# Patient Record
Sex: Female | Born: 1960 | Race: Black or African American | Hispanic: No | Marital: Married | Smoking: Former smoker
Health system: Southern US, Community
[De-identification: ages and names within clinical notes are randomized; demographics above are authoritative.]

## PROBLEM LIST (undated history)

## (undated) DIAGNOSIS — F419 Anxiety disorder, unspecified: Secondary | ICD-10-CM

## (undated) DIAGNOSIS — K743 Primary biliary cirrhosis: Secondary | ICD-10-CM

## (undated) DIAGNOSIS — J329 Chronic sinusitis, unspecified: Secondary | ICD-10-CM

## (undated) DIAGNOSIS — G4730 Sleep apnea, unspecified: Secondary | ICD-10-CM

## (undated) DIAGNOSIS — E669 Obesity, unspecified: Secondary | ICD-10-CM

## (undated) DIAGNOSIS — K219 Gastro-esophageal reflux disease without esophagitis: Secondary | ICD-10-CM

## (undated) DIAGNOSIS — Z87442 Personal history of urinary calculi: Secondary | ICD-10-CM

## (undated) DIAGNOSIS — M1990 Unspecified osteoarthritis, unspecified site: Secondary | ICD-10-CM

## (undated) DIAGNOSIS — I10 Essential (primary) hypertension: Secondary | ICD-10-CM

## (undated) DIAGNOSIS — R011 Cardiac murmur, unspecified: Secondary | ICD-10-CM

## (undated) DIAGNOSIS — B009 Herpesviral infection, unspecified: Secondary | ICD-10-CM

## (undated) DIAGNOSIS — R350 Frequency of micturition: Secondary | ICD-10-CM

## (undated) HISTORY — PX: UMBILICAL HERNIA REPAIR: SHX196

## (undated) HISTORY — DX: Gastro-esophageal reflux disease without esophagitis: K21.9

## (undated) HISTORY — DX: Obesity, unspecified: E66.9

## (undated) HISTORY — DX: Essential (primary) hypertension: I10

## (undated) HISTORY — PX: OTHER SURGICAL HISTORY: SHX169

## (undated) HISTORY — DX: Primary biliary cirrhosis: K74.3

---

## 2001-05-16 ENCOUNTER — Ambulatory Visit (HOSPITAL_COMMUNITY): Admission: RE | Admit: 2001-05-16 | Discharge: 2001-05-16 | Payer: Self-pay | Admitting: Internal Medicine

## 2001-05-16 ENCOUNTER — Encounter: Payer: Self-pay | Admitting: Internal Medicine

## 2001-05-29 ENCOUNTER — Encounter (HOSPITAL_COMMUNITY): Admission: RE | Admit: 2001-05-29 | Discharge: 2001-06-28 | Payer: Self-pay | Admitting: Internal Medicine

## 2001-07-01 ENCOUNTER — Encounter (HOSPITAL_COMMUNITY): Admission: RE | Admit: 2001-07-01 | Discharge: 2001-07-31 | Payer: Self-pay | Admitting: Internal Medicine

## 2001-08-09 ENCOUNTER — Other Ambulatory Visit: Admission: RE | Admit: 2001-08-09 | Discharge: 2001-08-09 | Payer: Self-pay | Admitting: Obstetrics and Gynecology

## 2003-10-10 HISTORY — PX: OTHER SURGICAL HISTORY: SHX169

## 2003-10-26 ENCOUNTER — Ambulatory Visit (HOSPITAL_COMMUNITY): Admission: RE | Admit: 2003-10-26 | Discharge: 2003-10-26 | Payer: Self-pay | Admitting: Internal Medicine

## 2004-07-22 ENCOUNTER — Encounter (INDEPENDENT_AMBULATORY_CARE_PROVIDER_SITE_OTHER): Payer: Self-pay | Admitting: *Deleted

## 2004-08-29 ENCOUNTER — Ambulatory Visit (HOSPITAL_COMMUNITY): Admission: RE | Admit: 2004-08-29 | Discharge: 2004-08-29 | Payer: Self-pay | Admitting: General Surgery

## 2004-09-06 ENCOUNTER — Ambulatory Visit: Payer: Self-pay | Admitting: Family Medicine

## 2004-11-07 ENCOUNTER — Ambulatory Visit (HOSPITAL_COMMUNITY): Admission: RE | Admit: 2004-11-07 | Discharge: 2004-11-07 | Payer: Self-pay | Admitting: Family Medicine

## 2004-12-28 ENCOUNTER — Ambulatory Visit (HOSPITAL_COMMUNITY): Admission: RE | Admit: 2004-12-28 | Discharge: 2004-12-28 | Payer: Self-pay | Admitting: Pulmonary Disease

## 2005-01-02 ENCOUNTER — Ambulatory Visit: Payer: Self-pay | Admitting: Family Medicine

## 2005-02-06 ENCOUNTER — Ambulatory Visit: Payer: Self-pay | Admitting: Family Medicine

## 2005-02-06 ENCOUNTER — Ambulatory Visit: Payer: Self-pay | Admitting: Internal Medicine

## 2005-02-13 ENCOUNTER — Ambulatory Visit: Payer: Self-pay | Admitting: Internal Medicine

## 2005-02-13 ENCOUNTER — Ambulatory Visit (HOSPITAL_COMMUNITY): Admission: RE | Admit: 2005-02-13 | Discharge: 2005-02-13 | Payer: Self-pay | Admitting: Internal Medicine

## 2005-05-09 ENCOUNTER — Ambulatory Visit: Payer: Self-pay | Admitting: Internal Medicine

## 2005-06-26 ENCOUNTER — Ambulatory Visit: Payer: Self-pay | Admitting: Family Medicine

## 2005-10-09 HISTORY — PX: OTHER SURGICAL HISTORY: SHX169

## 2005-11-06 ENCOUNTER — Ambulatory Visit: Payer: Self-pay | Admitting: Family Medicine

## 2005-12-04 ENCOUNTER — Encounter: Admission: RE | Admit: 2005-12-04 | Discharge: 2005-12-04 | Payer: Self-pay | Admitting: General Surgery

## 2005-12-12 ENCOUNTER — Encounter: Admission: RE | Admit: 2005-12-12 | Discharge: 2005-12-12 | Payer: Self-pay | Admitting: Obstetrics and Gynecology

## 2005-12-25 ENCOUNTER — Ambulatory Visit (HOSPITAL_COMMUNITY): Admission: RE | Admit: 2005-12-25 | Discharge: 2005-12-25 | Payer: Self-pay | Admitting: Interventional Radiology

## 2005-12-28 ENCOUNTER — Inpatient Hospital Stay (HOSPITAL_COMMUNITY): Admission: RE | Admit: 2005-12-28 | Discharge: 2005-12-29 | Payer: Self-pay | Admitting: Interventional Radiology

## 2006-01-24 ENCOUNTER — Ambulatory Visit (HOSPITAL_COMMUNITY): Admission: RE | Admit: 2006-01-24 | Discharge: 2006-01-24 | Payer: Self-pay | Admitting: General Surgery

## 2006-01-24 ENCOUNTER — Encounter (INDEPENDENT_AMBULATORY_CARE_PROVIDER_SITE_OTHER): Payer: Self-pay | Admitting: *Deleted

## 2006-04-02 ENCOUNTER — Ambulatory Visit: Payer: Self-pay | Admitting: Family Medicine

## 2006-04-04 ENCOUNTER — Ambulatory Visit (HOSPITAL_COMMUNITY): Admission: RE | Admit: 2006-04-04 | Discharge: 2006-04-04 | Payer: Self-pay | Admitting: Family Medicine

## 2006-06-04 ENCOUNTER — Ambulatory Visit: Payer: Self-pay | Admitting: Family Medicine

## 2006-06-19 ENCOUNTER — Encounter: Admission: RE | Admit: 2006-06-19 | Discharge: 2006-06-19 | Payer: Self-pay | Admitting: Interventional Radiology

## 2006-10-29 ENCOUNTER — Ambulatory Visit: Payer: Self-pay | Admitting: Family Medicine

## 2006-12-03 ENCOUNTER — Encounter: Payer: Self-pay | Admitting: Family Medicine

## 2006-12-03 LAB — CONVERTED CEMR LAB
ALT: 24 units/L (ref 0–35)
AST: 24 units/L (ref 0–37)
BUN: 11 mg/dL (ref 6–23)
Basophils Relative: 1 % (ref 0–1)
CO2: 22 meq/L (ref 19–32)
Calcium: 9.2 mg/dL (ref 8.4–10.5)
Cholesterol: 193 mg/dL (ref 0–200)
Creatinine, Ser: 0.73 mg/dL (ref 0.40–1.20)
Eosinophils Absolute: 0.4 10*3/uL (ref 0.0–0.7)
Indirect Bilirubin: 0.5 mg/dL (ref 0.0–0.9)
LDL Cholesterol: 133 mg/dL — ABNORMAL HIGH (ref 0–99)
Lymphs Abs: 2 10*3/uL (ref 0.7–3.3)
MCV: 90.1 fL (ref 78.0–100.0)
Neutro Abs: 2.5 10*3/uL (ref 1.7–7.7)
Platelets: 261 10*3/uL (ref 150–400)
Potassium: 3.5 meq/L (ref 3.5–5.3)
Sodium: 141 meq/L (ref 135–145)
Total Bilirubin: 0.7 mg/dL (ref 0.3–1.2)
Total CHOL/HDL Ratio: 4.9
Total Protein: 7.5 g/dL (ref 6.0–8.3)
VLDL: 21 mg/dL (ref 0–40)

## 2006-12-04 ENCOUNTER — Encounter: Payer: Self-pay | Admitting: Family Medicine

## 2006-12-04 LAB — CONVERTED CEMR LAB
Folate: >20 ng/mL
Retic Ct Pct: 1.2 % (ref 0.4–3.1)
Vitamin B-12: 1508 pg/mL — ABNORMAL HIGH (ref 211–911)

## 2006-12-05 ENCOUNTER — Encounter (INDEPENDENT_AMBULATORY_CARE_PROVIDER_SITE_OTHER): Payer: Self-pay | Admitting: *Deleted

## 2006-12-05 ENCOUNTER — Other Ambulatory Visit: Admission: RE | Admit: 2006-12-05 | Discharge: 2006-12-05 | Payer: Self-pay | Admitting: Family Medicine

## 2006-12-05 ENCOUNTER — Ambulatory Visit: Payer: Self-pay | Admitting: Family Medicine

## 2006-12-05 LAB — CONVERTED CEMR LAB: Pap Smear: NORMAL

## 2007-10-10 ENCOUNTER — Encounter: Payer: Self-pay | Admitting: Family Medicine

## 2008-01-28 ENCOUNTER — Encounter: Payer: Self-pay | Admitting: Family Medicine

## 2008-01-28 LAB — CONVERTED CEMR LAB: Retic Ct Pct: 1.3 % (ref 0.4–3.1)

## 2008-02-04 ENCOUNTER — Encounter: Payer: Self-pay | Admitting: Family Medicine

## 2008-02-04 ENCOUNTER — Other Ambulatory Visit: Admission: RE | Admit: 2008-02-04 | Discharge: 2008-02-04 | Payer: Self-pay | Admitting: Family Medicine

## 2008-02-04 ENCOUNTER — Encounter (INDEPENDENT_AMBULATORY_CARE_PROVIDER_SITE_OTHER): Payer: Self-pay | Admitting: *Deleted

## 2008-02-04 ENCOUNTER — Ambulatory Visit: Payer: Self-pay | Admitting: Family Medicine

## 2008-02-12 ENCOUNTER — Encounter (INDEPENDENT_AMBULATORY_CARE_PROVIDER_SITE_OTHER): Payer: Self-pay | Admitting: *Deleted

## 2008-02-12 DIAGNOSIS — E663 Overweight: Secondary | ICD-10-CM | POA: Insufficient documentation

## 2008-02-12 DIAGNOSIS — E669 Obesity, unspecified: Secondary | ICD-10-CM

## 2008-02-12 DIAGNOSIS — I10 Essential (primary) hypertension: Secondary | ICD-10-CM | POA: Insufficient documentation

## 2008-02-12 DIAGNOSIS — K219 Gastro-esophageal reflux disease without esophagitis: Secondary | ICD-10-CM

## 2008-06-23 ENCOUNTER — Encounter: Payer: Self-pay | Admitting: Family Medicine

## 2008-06-23 LAB — CONVERTED CEMR LAB
CO2: 28 meq/L (ref 19–32)
Calcium: 9.2 mg/dL (ref 8.4–10.5)
Chloride: 105 meq/L (ref 96–112)
Glucose, Bld: 90 mg/dL (ref 70–99)
HDL: 40 mg/dL (ref >39)
LDL Cholesterol: 143 mg/dL — ABNORMAL HIGH (ref 0–99)
Potassium: 3.9 meq/L (ref 3.5–5.3)
Total CHOL/HDL Ratio: 5

## 2008-07-15 ENCOUNTER — Ambulatory Visit: Payer: Self-pay | Admitting: Family Medicine

## 2008-07-15 DIAGNOSIS — IMO0002 Reserved for concepts with insufficient information to code with codable children: Secondary | ICD-10-CM

## 2008-07-15 DIAGNOSIS — M25559 Pain in unspecified hip: Secondary | ICD-10-CM

## 2008-07-23 ENCOUNTER — Ambulatory Visit (HOSPITAL_COMMUNITY): Admission: RE | Admit: 2008-07-23 | Discharge: 2008-07-23 | Payer: Self-pay | Admitting: Family Medicine

## 2008-07-27 ENCOUNTER — Telehealth: Payer: Self-pay | Admitting: Family Medicine

## 2008-08-20 ENCOUNTER — Telehealth: Payer: Self-pay | Admitting: Family Medicine

## 2008-08-24 ENCOUNTER — Encounter: Payer: Self-pay | Admitting: Family Medicine

## 2008-12-04 ENCOUNTER — Telehealth: Payer: Self-pay | Admitting: Family Medicine

## 2008-12-07 ENCOUNTER — Telehealth: Payer: Self-pay | Admitting: Family Medicine

## 2008-12-09 ENCOUNTER — Telehealth: Payer: Self-pay | Admitting: Family Medicine

## 2009-01-18 ENCOUNTER — Telehealth: Payer: Self-pay | Admitting: Family Medicine

## 2009-02-23 ENCOUNTER — Telehealth: Payer: Self-pay | Admitting: Family Medicine

## 2009-03-09 ENCOUNTER — Telehealth: Payer: Self-pay | Admitting: Family Medicine

## 2009-04-22 ENCOUNTER — Telehealth: Payer: Self-pay | Admitting: Family Medicine

## 2009-05-18 ENCOUNTER — Telehealth: Payer: Self-pay | Admitting: Family Medicine

## 2009-07-26 ENCOUNTER — Encounter: Payer: Self-pay | Admitting: Family Medicine

## 2009-07-26 DIAGNOSIS — R5383 Other fatigue: Secondary | ICD-10-CM

## 2009-07-26 DIAGNOSIS — R5381 Other malaise: Secondary | ICD-10-CM | POA: Insufficient documentation

## 2009-07-27 ENCOUNTER — Telehealth: Payer: Self-pay | Admitting: Family Medicine

## 2009-08-02 ENCOUNTER — Encounter: Payer: Self-pay | Admitting: Family Medicine

## 2009-08-02 LAB — CONVERTED CEMR LAB
BUN: 14 mg/dL (ref 6–23)
Chloride: 103 meq/L (ref 96–112)
Cholesterol: 201 mg/dL — ABNORMAL HIGH (ref 0–200)
Creatinine, Ser: 0.67 mg/dL (ref 0.40–1.20)
Eosinophils Absolute: 0.3 10*3/uL (ref 0.0–0.7)
Glucose, Bld: 84 mg/dL (ref 70–99)
HCT: 40 % (ref 36.0–46.0)
HDL: 43 mg/dL (ref >39)
Lymphocytes Relative: 33 % (ref 12–46)
MCHC: 30.3 g/dL (ref 30.0–36.0)
MCV: 92.8 fL (ref 78.0–100.0)
Platelets: 249 10*3/uL (ref 150–400)
Potassium: 4.1 meq/L (ref 3.5–5.3)
RBC: 4.31 M/uL (ref 3.87–5.11)
Triglycerides: 95 mg/dL (ref <150)

## 2009-08-05 ENCOUNTER — Encounter: Payer: Self-pay | Admitting: Family Medicine

## 2009-08-10 ENCOUNTER — Ambulatory Visit: Payer: Self-pay | Admitting: Family Medicine

## 2009-08-19 DIAGNOSIS — E785 Hyperlipidemia, unspecified: Secondary | ICD-10-CM

## 2009-09-27 ENCOUNTER — Telehealth: Payer: Self-pay | Admitting: Family Medicine

## 2009-11-03 ENCOUNTER — Ambulatory Visit: Payer: Self-pay | Admitting: Family Medicine

## 2010-04-04 ENCOUNTER — Telehealth (INDEPENDENT_AMBULATORY_CARE_PROVIDER_SITE_OTHER): Payer: Self-pay | Admitting: *Deleted

## 2010-05-23 ENCOUNTER — Telehealth: Payer: Self-pay | Admitting: Family Medicine

## 2010-06-15 ENCOUNTER — Ambulatory Visit: Payer: Self-pay | Admitting: Family Medicine

## 2010-06-15 ENCOUNTER — Other Ambulatory Visit: Admission: RE | Admit: 2010-06-15 | Discharge: 2010-06-15 | Payer: Self-pay | Admitting: Family Medicine

## 2010-06-15 ENCOUNTER — Encounter: Payer: Self-pay | Admitting: Cardiology

## 2010-06-15 DIAGNOSIS — R079 Chest pain, unspecified: Secondary | ICD-10-CM

## 2010-06-15 DIAGNOSIS — R7301 Impaired fasting glucose: Secondary | ICD-10-CM

## 2010-06-15 DIAGNOSIS — R9431 Abnormal electrocardiogram [ECG] [EKG]: Secondary | ICD-10-CM

## 2010-06-15 DIAGNOSIS — R002 Palpitations: Secondary | ICD-10-CM | POA: Insufficient documentation

## 2010-06-15 LAB — CONVERTED CEMR LAB: OCCULT 1: NEGATIVE

## 2010-06-18 ENCOUNTER — Encounter: Payer: Self-pay | Admitting: Cardiology

## 2010-06-20 ENCOUNTER — Encounter: Payer: Self-pay | Admitting: Family Medicine

## 2010-06-20 LAB — CONVERTED CEMR LAB
ALT: 15 units/L (ref 0–35)
AST: 18 units/L (ref 0–37)
Albumin: 4.1 g/dL (ref 3.5–5.2)
Alkaline Phosphatase: 59 units/L (ref 39–117)
Bilirubin, Direct: 0.1 mg/dL (ref 0.0–0.3)
CO2: 32 meq/L (ref 19–32)
Calcium: 9.2 mg/dL (ref 8.4–10.5)
HDL: 44 mg/dL (ref >39)
Hgb A1c MFr Bld: 5.2 % (ref <5.7)
Indirect Bilirubin: 0.5 mg/dL (ref 0.0–0.9)
LDL Cholesterol: 122 mg/dL — ABNORMAL HIGH (ref 0–99)
Lymphocytes Relative: 30 % (ref 12–46)
Lymphs Abs: 1.3 10*3/uL (ref 0.7–4.0)
MCHC: 30.8 g/dL (ref 30.0–36.0)
MCV: 92.6 fL (ref 78.0–100.0)
Monocytes Absolute: 0.4 10*3/uL (ref 0.1–1.0)
Neutro Abs: 2.1 10*3/uL (ref 1.7–7.7)
Pap Smear: NEGATIVE
RBC: 4.21 M/uL (ref 3.87–5.11)
TSH: 0.956 microintl units/mL (ref 0.350–4.500)
Total CHOL/HDL Ratio: 4.2

## 2010-06-21 ENCOUNTER — Encounter: Payer: Self-pay | Admitting: Family Medicine

## 2010-06-21 ENCOUNTER — Ambulatory Visit: Payer: Self-pay | Admitting: Cardiology

## 2010-06-21 ENCOUNTER — Encounter (INDEPENDENT_AMBULATORY_CARE_PROVIDER_SITE_OTHER): Payer: Self-pay | Admitting: *Deleted

## 2010-06-21 DIAGNOSIS — R55 Syncope and collapse: Secondary | ICD-10-CM

## 2010-06-29 ENCOUNTER — Ambulatory Visit: Payer: Self-pay | Admitting: Cardiology

## 2010-06-29 ENCOUNTER — Encounter: Payer: Self-pay | Admitting: Cardiology

## 2010-07-20 ENCOUNTER — Encounter: Payer: Self-pay | Admitting: Cardiology

## 2010-07-21 ENCOUNTER — Telehealth: Payer: Self-pay | Admitting: Cardiology

## 2010-07-26 ENCOUNTER — Encounter (INDEPENDENT_AMBULATORY_CARE_PROVIDER_SITE_OTHER): Payer: Self-pay | Admitting: *Deleted

## 2010-07-26 ENCOUNTER — Ambulatory Visit: Payer: Self-pay | Admitting: Cardiology

## 2010-07-26 DIAGNOSIS — R9430 Abnormal result of cardiovascular function study, unspecified: Secondary | ICD-10-CM | POA: Insufficient documentation

## 2010-08-01 ENCOUNTER — Telehealth (INDEPENDENT_AMBULATORY_CARE_PROVIDER_SITE_OTHER): Payer: Self-pay | Admitting: *Deleted

## 2010-08-15 ENCOUNTER — Encounter: Payer: Self-pay | Admitting: Family Medicine

## 2010-10-18 ENCOUNTER — Ambulatory Visit
Admission: RE | Admit: 2010-10-18 | Discharge: 2010-10-18 | Payer: Self-pay | Source: Home / Self Care | Attending: Family Medicine | Admitting: Family Medicine

## 2010-10-18 DIAGNOSIS — N760 Acute vaginitis: Secondary | ICD-10-CM | POA: Insufficient documentation

## 2010-10-20 ENCOUNTER — Encounter: Payer: Self-pay | Admitting: Family Medicine

## 2010-11-08 NOTE — Letter (Signed)
Summary: HISTORY AND PHYSICAL  HISTORY AND PHYSICAL   Imported By: Lind Guest 03/01/2010 12:58:03  _____________________________________________________________________  External Attachment:    Type:   Image     Comment:   External Document

## 2010-11-08 NOTE — Assessment & Plan Note (Signed)
Summary: physical   Vital Signs:  Patient profile:   50 year old female Height:      69 inches Weight:      225 pounds BMI:     33.35 O2 Sat:      96 % Pulse rate:   64 / minute Pulse rhythm:   regular Resp:     16 per minute BP sitting:   122 / 74  (right arm) Cuff size:   regular  Vitals Entered By: Everitt Amber LPN (June 15, 2010 1:39 PM)  Nutrition Counseling: Patient's BMI is greater than 25 and therefore counseled on weight management options. CC: CPE  Vision Screening:Left eye w/o correction: 20 / 25 Right Eye w/o correction: 20 / 20 Both eyes w/o correction:  20/ 20  Color vision testing: normal      Vision Entered By: Everitt Amber LPN (June 15, 2010 1:45 PM)   CC:  CPE.  History of Present Illness: Reports  that she has been doing fairly well Denies recent fever or chills. Denies sinus pressure, nasal congestion , ear pain or sore throat. Denies chest congestion, or cough productive of sputum.  Denies abdominal pain, nausea, vomitting, diarrhea or constipation.She does have gERD and is aware that caffeine that she loves aggravates this.not using omeprazole daily, never had upper endo Denies change in bowel movements or bloody stool. Denies dysuria , frequency, incontinence or hesitancy. Denies  joint pain, swelling, or reduced mobility. Denies headaches, vertigo, seizures. Denies depression, anxiety or insomnia. Denies  rash, lesions, or itch.     Allergies: No Known Drug Allergies  Past History:  Past medical, surgical, family and social histories (including risk factors) reviewed, and no changes noted (except as noted below).  Past Medical History: Reviewed history from 02/12/2008 and no changes required. Current Problems:  GERD (ICD-530.81) OBESITY (ICD-278.00) HYPERTENSION (ICD-401.9)  Past Surgical History: Reviewed history from 02/12/2008 and no changes required. Umbilical hernia repair infancy Embolism Femoral artery x2  03-08-06 Left inguinal hernia repair and endometriosis Mar 08, 2006 Left parotid bx benign 03/08/04  Family History: Reviewed history from 02/12/2008 and no changes required. Mother has HTN,Hyperlipidemia,thyroid disease, died in 2009/03/08, had CAD and irreg geart rate Father healthy 2 Sisters one of whom is HTN,and the other has fibroids. Brother 1 healthy  Social History: Reviewed history from 08/10/2009 and no changes required. Employed Married children 1, 40 y/o son has ulcerative colitis Former Smoker Alcohol use-yes Drug use-yes  Review of Systems      See HPI General:  Complains of fatigue; drinks excessivecaffeine which bothers her reflux. Eyes:  Denies blurring and discharge. CV:  Complains of chest pain or discomfort, lightheadness, and palpitations; 3 month h/o intermittent posterior left chest pain,ni known aggravating factor, 6 month h/o intermittent palpitations, mother had CAD. GI:  Complains of abdominal pain; gERD, using omeprazole less coonsitently. Derm:  Complains of changes in nail beds; thickeneing and discoloration of both great toes. Endo:  Denies cold intolerance, excessive thirst, and excessive urination. Heme:  Denies abnormal bruising and bleeding. Allergy:  Complains of seasonal allergies; denies hives or rash and itching eyes.  Physical Exam  General:  Well-developed,obese,in no acute distress; alert,appropriate and cooperative throughout examination Head:  Normocephalic and atraumatic without obvious abnormalities. No apparent alopecia or balding. Eyes:  No corneal or conjunctival inflammation noted. EOMI. Perrla. Funduscopic exam benign, without hemorrhages, exudates or papilledema. Vision grossly normal. Ears:  External ear exam shows no significant lesions or deformities.  Otoscopic examination reveals clear  canals, tympanic membranes are intact bilaterally without bulging, retraction, inflammation or discharge. Hearing is grossly normal bilaterally. Nose:  External  nasal examination shows no deformity or inflammation. Nasal mucosa are pink and moist without lesions or exudates. Mouth:  Oral mucosa and oropharynx without lesions or exudates.  Teeth in good repair. Neck:  No deformities, masses, or tenderness noted. Chest Wall:  No deformities, masses, or tenderness noted. Breasts:  No mass, nodules, thickening, tenderness, bulging, retraction, inflamation, nipple discharge or skin changes noted.   Lungs:  Normal respiratory effort, chest expands symmetrically. Lungs are clear to auscultation, no crackles or wheezes. Heart:  Normal rate and regular rhythm. S1 and S2 normal without gallop, murmur, click, rub or other extra sounds. Abdomen:  Bowel sounds positive,abdomen soft and non-tender without masses, organomegaly or hernias noted. Rectal:  No external abnormalities noted. Normal sphincter tone. No rectal masses or tenderness. Genitalia:  Normal introitus for age, no external lesions, no vaginal discharge, mucosa pink and moist, no vaginal or cervical lesions, no vaginal atrophy, no friaility or hemorrhage, normal uterus size and position, no adnexal masses or tenderness Msk:  No deformity or scoliosis noted of thoracic or lumbar spine.   Pulses:  R and L carotid,radial,femoral,dorsalis pedis and posterior tibial pulses are full and equal bilaterally Extremities:  No clubbing, cyanosis, edema, or deformity noted with normal full range of motion of all joints.   Neurologic:  No cranial nerve deficits noted. Station and gait are normal. Plantar reflexes are down-going bilaterally. DTRs are symmetrical throughout. Sensory, motor and coordinative functions appear intact. Skin:  onychomycosis of great toenails Cervical Nodes:  No lymphadenopathy noted Axillary Nodes:  No palpable lymphadenopathy Inguinal Nodes:  No significant adenopathy Psych:  Cognition and judgment appear intact. Alert and cooperative with normal attention span and concentration. No apparent  delusions, illusions, hallucinations   Impression & Recommendations:  Problem # 1:  SPECIAL SCREENING FOR MALIGNANT NEOPLASMS COLON (ICD-V76.51) Assessment Comment Only  Orders: Hemoccult Guaiac-1 spec.(in office) (82270)  Problem # 2:  SCREENING FOR MALIGNANT NEOPLASM OF THE CERVIX (ICD-V76.2) Assessment: Comment Only  Orders: Pap Smear (09811)  Problem # 3:  ABNORMAL ELECTROCARDIOGRAM (ICD-794.31) Assessment: Comment Only  Orders: Cardiology Referral (Cardiology), pt has palpitations and has a fam h/o cAD and irreg heartbeat  Problem # 4:  GERD (ICD-530.81) Assessment: Unchanged  Her updated medication list for this problem includes:    Omeprazole 20 Mg Cpdr (Omeprazole) ..... One cap by mouth once daily as needed  Problem # 5:  OBESITY (ICD-278.00) Assessment: Unchanged  Ht: 69 (06/15/2010)   Wt: 225 (06/15/2010)   BMI: 33.35 (06/15/2010), lifesrtyle change encouraged to facilitate weight loss  Problem # 6:  HYPERTENSION (ICD-401.9) Assessment: Unchanged  The following medications were removed from the medication list:    Micardis Hct 80-25 Mg Tabs (Telmisartan-hctz) ..... One tab by mouth once daily Her updated medication list for this problem includes:    Amlodipine Besylate 5 Mg Tabs (Amlodipine besylate) ..... One tab by mouth once daily    Maxzide-25 37.5-25 Mg Tabs (Triamterene-hctz) ..... One tab by mouth qd  BP today: 122/74 Prior BP: 122/82 (11/03/2009)  Labs Reviewed: K+: 4.1 (08/02/2009) Creat: : 0.67 (08/02/2009)   Chol: 201 (08/02/2009)   HDL: 43 (08/02/2009)   LDL: 139 (08/02/2009)   TG: 95 (08/02/2009)  Complete Medication List: 1)  Multivitamin/iron Tabs (Multiple vitamins-iron) .... One tab by mouth once daily 2)  Amlodipine Besylate 5 Mg Tabs (Amlodipine besylate) .... One tab by mouth once  daily 3)  Omeprazole 20 Mg Cpdr (Omeprazole) .... One cap by mouth once daily as needed 4)  Maxzide-25 37.5-25 Mg Tabs (Triamterene-hctz) .... One tab by  mouth qd 5)  Terbinafine Hcl 250 Mg Tabs (Terbinafine hcl) .... Take 1 tablet by mouth once a day  Other Orders: Radiology Referral (Radiology) EKG w/ Interpretation (93000) T-Basic Metabolic Panel [phone]) T-Hepatic Function [phone]) T-Lipid Profile [phone]) T-TSH [id number]) T-CBC w/Diff ([id number]) T- Hemoglobin A1C (02725-36644)  Patient Instructions: 1)  F/U in 5.5 months 2)  pls cut down on caffeine. 3)  Med is sent in for thefungal toenail infectiion, pls do not start until we have your blood test results back and you hear they are normal(liver). 4)    5)  BMP prior to visit, ICD-9: 6)  Hepatic Panel prior to visit, ICD-9: 7)  Lipid Panel prior to visit, ICD-9:   fasting asap. 8)  TSH prior to visit, ICD-9: 9)  you willo be referred for cerdiology eval, due to palpitations, posterior chest pain and a fam h/o cAD in your mother. 10)  CBC w/ Diff prior to visit, ICD-9: 11)  HbgA1C prior to visit, ICD-9: 12)  It is important that you exercise regularly at least 20 minutes 5 times a week. If you develop chest pain, have severe difficulty breathing, or feel very tired , stop exercising immediately and seek medical attention. 13)  You need to lose weight. Consider a lower calorie diet and regular exercise.  14)  [place] will be scheduled Prescriptions: TERBINAFINE HCL 250 MG TABS (TERBINAFINE HCL) Take 1 tablet by mouth once a day  #30 x 3   Entered and Authorized by:   Syliva Overman MD   Signed by:   Syliva Overman MD on 06/15/2010   Method used:   Electronically to        [facility] (retail)       [address]       [place], [place]  [address]       Ph: [phone]       Fax: [phone]   RxID:   [id number]    Laboratory Results    Stool - Occult Blood Hemmoccult #1: negative Date: 06/15/2010 Comments: [id number] 1L 3/12 118 1012

## 2010-11-08 NOTE — Progress Notes (Signed)
Summary: refill  Phone Note Call from Patient   Summary of Call: pt has appt for 06/15/2010 but needs another refill on maxide. hctz [phone] Initial call taken by: Rudene Anda,  May 23, 2010 3:26 PM  Follow-up for Phone Call        Phone Call Completed, Rx Called In Follow-up by: Adella Hare LPN,  May 24, 2010 10:22 AM    Prescriptions: MAXZIDE-25 37.5-25 MG TABS (TRIAMTERENE-HCTZ) one tab by mouth qd  #30 Each x 0   Entered by:   Adella Hare LPN   Authorized by:   Syliva Overman MD   Signed by:   Adella Hare LPN on 03/47/4259   Method used:   Electronically to        [facility] (retail)       [address]       [place], [place]  [address]       Ph: [phone]       Fax: [phone]   RxID:   [id number]

## 2010-11-08 NOTE — Letter (Signed)
Summary: phone notes  phone notes   Imported By: Lind Guest 03/01/2010 12:50:28  _____________________________________________________________________  External Attachment:    Type:   Image     Comment:   External Document

## 2010-11-08 NOTE — Letter (Signed)
Summary: misc  misc   Imported By: Lind Guest 03/01/2010 12:49:22  _____________________________________________________________________  External Attachment:    Type:   Image     Comment:   External Document

## 2010-11-08 NOTE — Progress Notes (Signed)
Summary: Missed appointment  Phone Note Call from Patient Call back at Home Phone [phone]   Caller: Patient Reason for Call: Talk to Nurse Summary of Call: patient called because she missed her appointment with [facility] yesterday.  She needs to know if she really needs to come into the office to review is test results.  She doesnt have the money to come in or time to ask off from work.  Please call and let her know Initial call taken by: Claudette Laws,  July 21, 2010 8:49 AM  Follow-up for Phone Call        Her stress echocardiogram result was equivocal. I wanted to see her back to review symptoms and determine if we needed to arrange any additional testing. Will ask nursing to contact. Follow-up by: Loreli Slot, MD, [facility],  July 21, 2010 11:24 AM  Additional Follow-up for Phone Call Additional follow up Details #1::        Pt r/s for Tues Oct 18th at 2pm. Pt is aware of appt date/time. Additional Follow-up by: Cyril Loosen, RN, BSN,  July 21, 2010 2:19 PM

## 2010-11-08 NOTE — Progress Notes (Signed)
Summary: DR IN [place]  Phone Note Call from Patient   Summary of Call: HER SON NEEDS A PRIMARY DR WHO WOULD YOU RECOMEND IN [place] CALL BACK AT [phone] TO LET HER KNOW UNLESS YOU WILL TAKE HIM AS A NEW PATIENT Initial call taken by: Lind Guest,  May 23, 2010 3:32 PM  Follow-up for Phone Call        chck if he has ins pls Follow-up by: Syliva Overman MD,  May 24, 2010 3:46 PM  Additional Follow-up for Phone Call Additional follow up Details #1::        has bcbc  will come in tomorrow to see you at 2:45 Additional Follow-up by: Lind Guest,  May 25, 2010 11:19 AM

## 2010-11-08 NOTE — Assessment & Plan Note (Signed)
Summary: bp check  Nurse Visit   Vital Signs:  Patient profile:   50 year old female Height:      69 inches Weight:      227 pounds BMI:     33.64 O2 Sat:      95 % Pulse rate:   90 / minute Resp:     16 per minute BP sitting:   122 / 82  Vitals Entered By: Everitt Amber (November 03, 2009 4:37 PM) Comments BP check    Allergies: No Known Drug Allergies  Orders Added: 1)  No Charge Patient Arrived (NCPA0) [NCPA0]

## 2010-11-08 NOTE — Letter (Signed)
Summary: External Correspondence/ OFFICE VISIT [facility]  External Correspondence/ OFFICE VISIT [facility]   Imported By: Dorise Hiss 06/21/2010 10:26:34  _____________________________________________________________________  External Attachment:    Type:   Image     Comment:   External Document

## 2010-11-08 NOTE — Progress Notes (Signed)
Summary: rx  Phone Note Call from Patient   Summary of Call: says rx was denied and needs to speak with nurse. [phone] Initial call taken by: Rudene Anda,  April 04, 2010 9:26 AM  Follow-up for Phone Call        returned call, no answer, if calls back, needs to schedule appt Follow-up by: Adella Hare LPN,  April 04, 2010 2:55 PM  Additional Follow-up for Phone Call Additional follow up Details #1::        no insurance, sent in two months until insurance starts up Additional Follow-up by: Adella Hare LPN,  April 04, 2010 4:05 PM    Prescriptions: MAXZIDE-25 37.5-25 MG TABS (TRIAMTERENE-HCTZ) one tab by mouth qd  #30 Each x 1   Entered by:   Adella Hare LPN   Authorized by:   Syliva Overman MD   Signed by:   Adella Hare LPN on 45/40/9811   Method used:   Electronically to        [facility] (retail)       [address]       [place], [place]  [address]       Ph: [phone]       Fax: [phone]   RxID:   [id number]

## 2010-11-08 NOTE — Letter (Signed)
Summary: office notes  office notes   Imported By: Lind Guest 03/01/2010 12:50:00  _____________________________________________________________________  External Attachment:    Type:   Image     Comment:   External Document

## 2010-11-08 NOTE — Letter (Signed)
Summary: x rays  x rays   Imported By: Lind Guest 03/01/2010 12:51:20  _____________________________________________________________________  External Attachment:    Type:   Image     Comment:   External Document

## 2010-11-08 NOTE — Letter (Signed)
Summary: Letter  Letter   Imported By: Lind Guest 06/22/2010 14:29:51  _____________________________________________________________________  External Attachment:    Type:   Image     Comment:   External Document

## 2010-11-08 NOTE — Letter (Signed)
Summary: Pharmacist, community at [facility] [address]   [place], [place] [address]   Phone: [phone]  Fax: [phone]      [facility] Cardiovascular Services  Cardiolite Stress Test     [facility]  Your doctor has ordered a Cardiolite Stress Test to help determine the condition of your heart during stress. If you take blood pressure medicine, ask your doctor if you should take it the day of your test. You should not have anything to eat or drink at least 4 hours before your test is scheduled, and no caffeine (coffee, tea, decaf. or chocolate) for 24 hours before your test.   You will need to register at the Outpatient/Main Entrance at the hospital 30 minutes before your appointment time. It is a good idea to bring a copy of your order with you. They will direct you to the Diagnostic Imaging (Radiology) Department.  You will be asked to undress from the waist up and be given a hospital gown to wear, so dress comfortably from the waist down, for example:    Sweat pants, shorts or skirt   Rubber-soled lace up shoes (i.e. tennis shoes)  Plan on about three hours from registration to release from the hospital.    Date of Test:              Time of Test              You may take your medications with water the morning of your test.  If the results of your test are normal or stable, you will receive a letter. If they are abnormal, the nurse will contact you by phone.

## 2010-11-08 NOTE — Letter (Signed)
Summary: Stress Echocardiography  [facility] [address]   [place], [place] [address]   Phone: [phone]  Fax: [phone]      [facility] Cardiovascular Services  Stress Echocardiography    [facility]  Appointment Date:_  Appointment Time:_   Your doctor has ordered a stress echo to help determine the condition of your heart during exercise. If you take blood pressure medication, ask your doctor if you should take it the day of your test. You should not have anything to eat or drink at least 4 hours before your test is scheduled.  You will be asked to undress from the waist up and given a hospital gown to wear, so dress comfortably from the waist down for example: Sweat pants, shorts, or skirt Rubber soled lace up shoes (tennis shoes)  You will need to register at the Outpatient/Main Entrance at the hospital 15 minutes before your appointment time. It is a good idea to bring a copy of your order with you. They will direct you to the Cardiovascular Department on the third floor.   Plan on about an hour and a half  from registration to release from the hospital

## 2010-11-08 NOTE — Assessment & Plan Note (Signed)
Summary: NP-ABNORMAL EKG W/PALPS & CP   Primary Provider:  Dr. Syliva Overman   History of Present Illness: 50 year old woman referred for cardiology consultation. She is referred with a history of heart "skips." She does not indicate any major feeling of rapid palpitations. She does occasionally feel a brief "dizziness" if she bends over or twists suddenly. No exertional chest pain, and she reports NYHA class II dyspnea on exertion. She does not exercise regularly. She does report dependent edema as she works on her feet most of the day.  Recent labs from 10 September showed hemoglobin 12.0, platelets 241, sodium 142, potassium 3.6, BUN 11, creatinine 0.6, triglycerides 84, cholesterol 183, HDL 44, LDL 122, AST 18, ALT 15, TSH 0.9, hemoglobin A1c 5.2%. Faxed copy of recent EKG reviewed, difficult to interpret given poor image quality. Sinus rhythm is noted. Nonspecific T-wave changes. Decreased R-wave progression without Q waves anteriorly.  She has not undergone any recent ischemic evaluation. She states that she is interested in starting an exercise regimen to work on weight loss and also her cholesterol. She relates somewhat of a hesitancy to pursue exercise in light of her mother's prior difficulaties with heart problems.  Current Medications (verified): 1)  Multivitamin/iron   Tabs (Multiple Vitamins-Iron) .... One Tab By Mouth Once Daily 2)  Amlodipine Besylate 5 Mg  Tabs (Amlodipine Besylate) .... One Tab By Mouth Once Daily 3)  Omeprazole 20 Mg  Cpdr (Omeprazole) .... One Cap By Mouth Once Daily As Needed 4)  Maxzide-25 37.5-25 Mg Tabs (Triamterene-Hctz) .... One Tab By Mouth Qd 5)  Terbinafine Hcl 250 Mg Tabs (Terbinafine Hcl) .... Take 1 Tablet By Mouth Once A Day 6)  Fluconazole 150 Mg Tabs (Fluconazole) .... Take One Tab By Mouth  Allergies (verified): No Known Drug Allergies  Comments:  Nurse/Medical Assistant: The patient's medications and allergies were verbally reviewed  with the patient and were updated in the Medication and Allergy Lists.  Past History:  Family History: Last updated: 06/21/2010 Mother has HTN, Hyperlipidemia, thyroid disease, died in 2009/03/01, had CAD and irreg geart rate Father healthy 2 Sisters one of whom is HTN, and the other has fibroids. Brother 1 healthy  Social History: Last updated: 06/21/2010 Employed Married Children 1, 98 y/o son has ulcerative colitis Former Smoker Alcohol use-yes Drug use-yes  Past Medical History: G E R D Hypertension Obesity  Past Surgical History: Umbilical hernia repair infancy Embolism Femoral artery x 2, 03/01/06 Left inguinal hernia repair and endometriosis, 2006/03/01 (no definitive records) Left parotid bx benign, Mar 01, 2004  Family History: Mother has HTN, Hyperlipidemia, thyroid disease, died in Mar 01, 2009, had CAD and irreg geart rate Father healthy 2 Sisters one of whom is HTN, and the other has fibroids. Brother 1 healthy  Social History: Employed Married Children 1, 65 y/o son has ulcerative colitis Former Smoker Alcohol use-yes Drug use-yes  Review of Systems       The patient complains of dyspnea on exertion and peripheral edema.  The patient denies anorexia, fever, weight loss, chest pain, syncope, melena, hematochezia, and severe indigestion/heartburn.         Also reports general fatigue, history of sinus problems, uses reading glasses, occasional reflux symptoms. Otherwise reviewed and negative.  Vital Signs:  Patient profile:   50 year old female Height:      69 inches Weight:      224 pounds O2 Sat:      97 % on Room air Pulse rate:   74 / minute BP sitting:  108 / 75  (left arm) Cuff size:   large  Vitals Entered By: Carlye Grippe (June 21, 2010 1:38 PM)  O2 Flow:  Room air  Physical Exam  Additional Exam:  Overweight woman in no acute distress. HEENT: Conjunctiva and lids normal, oropharynx clear. Neck: Supple, no elevated JVP or bruits. Lungs: Clear  auscultation, nonlabored. Cardiac: Regular rate and rhythm with ectopic beats, soft systolic murmur, no pericardial rub or S3 gallop. Abdomen: Soft, nontender, bowel sounds present. Skin: Warm and dry. Musculoskeletal: No gross deformities. Extremities: Trace ankle edema. Neuropsychiatric: Alert and oriented x3, affect appropriate.   Impression & Recommendations:  Problem # 1:  SYNCOPE AND COLLAPSE (ICD-780.2)  More specifically brief episodes of dizziness without frank syncope. Not necessarily associated with any frank palpitations, although on exam she does have ectopic beats. Review of her recent electrocardiogram shows nonspecific findings. Cardiac risk factors include hypertension and family history. She also has mildly elevated cholesterol. We discussed these issues, and plan an exercise echocardiogram, to exclude any exercise induced dysrhythmias, evaluate cardiac structure and function, and exclude ischemia. If reassuring, would recommend observation. Otherwise we can proceed with further evaluation. No medication changes made today.  Her updated medication list for this problem includes:    Amlodipine Besylate 5 Mg Tabs (Amlodipine besylate) ..... One tab by mouth once daily  Orders: Echo- Stress (Stress Echo)  Problem # 2:  HYPERTENSION (ICD-401.9)  Followed by Dr. Lodema Hong. Blood pressure looks good today.  Her updated medication list for this problem includes:    Amlodipine Besylate 5 Mg Tabs (Amlodipine besylate) ..... One tab by mouth once daily    Maxzide-25 37.5-25 Mg Tabs (Triamterene-hctz) ..... One tab by mouth qd  Problem # 3:  OTHER AND UNSPECIFIED HYPERLIPIDEMIA (ICD-272.4)  Recent lab work shows LDL of 122, HDL 44. Diet and exercise recommended, presuming stress testing is reassuring.  Patient Instructions: 1)  Exercise echo 2)  Follow up based on test results

## 2010-11-08 NOTE — Letter (Signed)
Summary: demo  demo   Imported By: Lind Guest 03/01/2010 12:48:10  _____________________________________________________________________  External Attachment:    Type:   Image     Comment:   External Document

## 2010-11-08 NOTE — Letter (Signed)
Summary: consults  consults   Imported By: Lind Guest 03/01/2010 12:47:42  _____________________________________________________________________  External Attachment:    Type:   Image     Comment:   External Document

## 2010-11-08 NOTE — Letter (Signed)
Summary: Appointment -missed  [facility] [address]   [place], [place] [address]   Phone: [phone]  Fax: [phone]     July 20, 2010 MRN: [id number]     BEADIE MATSUNAGA [address] [place], [place]  [address]     Dear Ms. Feil,  Our records indicate you missed your appointment on July 20, 2010                        with Dr. Diona Browner.   It is very important that we reach you to reschedule this appointment. We look forward to participating in your health care needs.   Please contact us at the number listed above at your earliest convenience to reschedule this appointment.   Sincerely,    Glass blower/designer

## 2010-11-08 NOTE — Assessment & Plan Note (Signed)
Summary: R/S FROM 10/12 OV TO DISCUSS RESULTS-JM   Visit Type:  Follow-up Primary Provider:  Dr. Syliva Overman   History of Present Illness: 50 year old woman presents for follow-up to review stress test results.  She was seen back in September with occasional dizziness and palpitations.  Stress echocardiogram interpreted by Dr. Myrtis Ser  is outlined below.  The results are equivocal at best, affected by suboptimal imaging. It is reassuring to note that her stress ECG portion was normal without diagnostic ST changes, however ischemia cannot be excluded by echocardiogram. I discussed this in detail with her today. She is not reporting any progressive symptoms, or classic angina.  I reviewed with her options going forward, including observation only at this time, versus proceeding on to additional ischemic evaluation. We discussed a variety of noninvasive and invasive techniques. At this point she prefers further testing to clarify the situation and provide reassurance.   Preventive Screening-Counseling & Management  Alcohol-Tobacco     Smoking Status: quit     Year Quit: 1999  Current Medications (verified): 1)  Multivitamin/iron   Tabs (Multiple Vitamins-Iron) .... One Tab By Mouth Once Daily 2)  Amlodipine Besylate 5 Mg  Tabs (Amlodipine Besylate) .... One Tab By Mouth Once Daily 3)  Omeprazole 20 Mg  Cpdr (Omeprazole) .... One Cap By Mouth Once Daily As Needed 4)  Maxzide-25 37.5-25 Mg Tabs (Triamterene-Hctz) .... One Tab By Mouth Qd 5)  Terbinafine Hcl 250 Mg Tabs (Terbinafine Hcl) .... Take 1 Tablet By Mouth Once A Day 6)  Fluconazole 150 Mg Tabs (Fluconazole) .... Take One Tab By Mouth  Allergies (verified): No Known Drug Allergies  Comments:  Nurse/Medical Assistant: The patient's medications and allergies were verbally reviewed with the patient and were updated in the Medication and Allergy Lists.  Past History:  Past Medical History: Last updated: 06/21/2010 G E R  D Hypertension Obesity  Social History: Last updated: 06/21/2010 Employed Married Children 1, 20 y/o son has ulcerative colitis Former Smoker Alcohol use-yes Drug use-yes  Review of Systems  The patient denies anorexia, fever, chest pain, syncope, peripheral edema, headaches, melena, and hematochezia.         Otherwise reviewed and negative.  Vital Signs:  Patient profile:   50 year old female Height:      69 inches Weight:      220 pounds Pulse rate:   71 / minute BP sitting:   139 / 80  (left arm) Cuff size:   large  Vitals Entered By: Carlye Grippe (July 26, 2010 2:02 PM)  Physical Exam  Additional Exam:  Overweight woman in no acute distress. HEENT: Conjunctiva and lids normal, oropharynx clear. Neck: Supple, no elevated JVP or bruits. Lungs: Clear auscultation, nonlabored. Cardiac: Regular rate and rhythm with ectopic beats, soft systolic murmur, no pericardial rub or S3 gallop. Abdomen: Soft, nontender, bowel sounds present. Skin: Warm and dry. Extremities: Trace ankle edema.    Stress Echocardiogram  Procedure date:  06/29/2010  Findings:      Exercise echocardiogram:  No diagnostic ST changes, 97% MPHR, no chest pain.  Suboptimal echo images - questionable inferior hypokinesis, cannot exclude ischemia.  Impression & Recommendations:  Problem # 1:  CARDIOVASCULAR FUNCTION STUDY, ABNORMAL (ICD-794.30)  Equivocal exercise echocardiogram as detailed above. The patient and I discussed this today. After reviewing a number of options, our plan is to proceed with an exercise Cardiolite to better clarify the situation, with no charge for this study.  At this point I  do not feel that we need to push further with invasive cardiac evaluation, in light of her atypical and nonprogressive symptoms.  I suspect that her risk for major obstructive CAD remains relatively low.  Depending on the results, we can determine addition plans.  Problem # 2:  HYPERTENSION  (ICD-401.9)  Followed by Dr. Lodema Hong.  Her updated medication list for this problem includes:    Amlodipine Besylate 5 Mg Tabs (Amlodipine besylate) ..... One tab by mouth once daily    Maxzide-25 37.5-25 Mg Tabs (Triamterene-hctz) ..... One tab by mouth qd  Other Orders: Nuclear Med (Nuc Med)  Patient Instructions: 1)  Your physician has requested that you have an exercise stress myoview.  For further information please visit [email or web address]  Please follow instruction sheet, as given. If the results of your test are normal or stable, you will receive a letter. If they are abnormal, the nurse will contact you by phone.  2)  Your physician recommends that you continue on your current medications as directed. Please refer to the Current Medication list given to you today. 3)  Follow up based on results.

## 2010-11-08 NOTE — Letter (Signed)
Summary: labs  labs   Imported By: Lind Guest 03/01/2010 12:48:36  _____________________________________________________________________  External Attachment:    Type:   Image     Comment:   External Document

## 2010-11-10 NOTE — Progress Notes (Signed)
  Phone Note Call from Patient   Summary of Call: patient's back has been hurting since last week and wants to be seen this week.  [phone] Initial call taken by: Rosine Beat,  August 01, 2010 1:14 PM  Follow-up for Phone Call        schedule appt for later this week Follow-up by: Adella Hare LPN,  August 01, 2010 2:09 PM

## 2010-11-10 NOTE — Assessment & Plan Note (Signed)
Summary: wants to be checked for std/slj   Vital Signs:  Patient profile:   50 year old female Height:      69 inches Weight:      226.25 pounds BMI:     33.53 O2 Sat:      95 % Pulse rate:   70 / minute Resp:     16 per minute BP sitting:   138 / 94  (left arm) Cuff size:   regular  Vitals Entered By: Everitt Amber LPN (October 18, 2010 4:06 PM) CC: Wants to be checked for STD's, and also since starting the terbinifine she has noticed these ringworm type patches that pop up all over her body    Primary Care Provider:  Dr. Syliva Overman  CC:  Wants to be checked for STD's and and also since starting the terbinifine she has noticed these ringworm type patches that pop up all over her body .  History of Present Illness: Reports  that she has been doing fairly well. She has a concern in that she recently caught her spouse cheating and is concerned about possible STD exposure.She actually moved out for 1 month, but has returned, this is not the  first episode. Weight loss efforts remain inconsitent Denies recent fever or chills. Denies sinus pressure, nasal congestion , ear pain or sore throat. Denies chest congestion, or cough productive of sputum. Denies chest pain, palpitations, PND, orthopnea or leg swelling. Denies abdominal pain, nausea, vomitting, diarrhea or constipation. Denies change in bowel movements or bloody stool. Denies dysuria , frequency, incontinence or hesitancy. Denies  joint pain, swelling, or reduced mobility. Denies headaches, vertigo, seizures. Denies depression, anxiety or insomnia. Denies  rash, lesions, or itch.Slow imoprovement in fungal toenail infection note on lamisil     Current Medications (verified): 1)  Amlodipine Besylate 5 Mg  Tabs (Amlodipine Besylate) .... One Tab By Mouth Once Daily 2)  Omeprazole 20 Mg  Cpdr (Omeprazole) .... One Cap By Mouth Once Daily As Needed 3)  Maxzide-25 37.5-25 Mg Tabs (Triamterene-Hctz) .... One Tab By Mouth  Qd 4)  Terbinafine Hcl 250 Mg Tabs (Terbinafine Hcl) .... Take 1 Tablet By Mouth Once A Day  Allergies (verified): No Known Drug Allergies  Review of Systems      See HPI General:  Denies chills, fatigue, and fever. Eyes:  Denies blurring and discharge. GU:  Complains of discharge. Psych:  Denies anxiety and depression. Endo:  Denies excessive hunger, excessive thirst, and excessive urination. Heme:  Denies abnormal bruising and bleeding. Allergy:  Denies hives or rash and itching eyes.  Physical Exam  General:  Well-developed,well-nourished,in no acute distress; alert,appropriate and cooperative throughout examination HEENT: No facial asymmetry,  EOMI, No sinus tenderness, TM's Clear, oropharynx  pink and moist.   Chest: Clear to auscultation bilaterally.  CVS: S1, S2, No murmurs, No S3.   Abd: Soft, Nontender.  MS: Adequate ROM spine, hips, shoulders and knees.  Ext: No edema.   CNS: CN 2-12 intact, power tone and sensation normal throughout.   Skin: Intact, no visible lesions or rashes.  Psych: Good eye contact, normal affect.  Memory intact, not anxious or depressed appearing. Pelvic:Fishy vag d/c , no cervical motion or adnexal tenderness   Impression & Recommendations:  Problem # 1:  VULVOVAGINITIS (ICD-616.10) Assessment Comment Only  Her updated medication list for this problem includes:    Metronidazole 500 Mg Tabs (Metronidazole) .Marland Kitchen... Take 1 tablet by mouth two times a day  Orders: T-Wet Prep by  Molecular Probe [phone]) T-Chlamydia & GC Probe, Genital ([id number])  Problem # 2:  OBESITY (ICD-278.00) Assessment: Deteriorated  Ht: 69 (10/18/2010)   Wt: 226.25 (10/18/2010)   BMI: 33.53 (10/18/2010) therapeutic lifestyle change discussed and encouraged  Problem # 3:  HYPERTENSION (ICD-401.9) Assessment: Deteriorated  Her updated medication list for this problem includes:    Amlodipine Besylate 5 Mg Tabs (Amlodipine besylate) ..... One tab by  mouth once daily    Maxzide-25 37.5-25 Mg Tabs (Triamterene-hctz) ..... One tab by mouth qd Patient advised to follow low sodium diet rich in fruit and vegetables, and to commit to at least 30 minutes 5 days per week of regular exercise , to improve blood presure control.   BP today: 138/94 Prior BP: 139/80 (07/26/2010)  Labs Reviewed: K+: 3.6 (06/18/2010) Creat: : 0.68 (06/18/2010)   Chol: 183 (06/18/2010)   HDL: 44 (06/18/2010)   LDL: 122 (06/18/2010)   TG: 84 (06/18/2010)  Problem # 4:  GERD (ICD-530.81) Assessment: Improved  Her updated medication list for this problem includes:    Omeprazole 20 Mg Cpdr (Omeprazole) ..... One cap by mouth once daily as needed  Complete Medication List: 1)  Amlodipine Besylate 5 Mg Tabs (Amlodipine besylate) .... One tab by mouth once daily 2)  Omeprazole 20 Mg Cpdr (Omeprazole) .... One cap by mouth once daily as needed 3)  Maxzide-25 37.5-25 Mg Tabs (Triamterene-hctz) .... One tab by mouth qd 4)  Terbinafine Hcl 250 Mg Tabs (Terbinafine hcl) .... Take 1 tablet by mouth once a day 5)  Betamethasone Valerate 0.1 % Crea (Betamethasone valerate) .... Apply twice daiily for 1 week , then as needed to rashes 6)  Metronidazole 500 Mg Tabs (Metronidazole) .... Take 1 tablet by mouth two times a day  Other Orders: T-Hepatic Function ([id number])  Patient Instructions: 1)  f/u in 4 to 5 months. 2)  Hepatic panel endFeb , non fasting 3)  Call for results on Thursday afternoon if you do not hear from the office pls 4)  It is important that you exercise regularly at least 20 minutes 5 times a week. If you develop chest pain, have severe difficulty breathing, or feel very tired , stop exercising immediately and seek medical attention. 5)  You need to lose weight. Consider a lower calorie diet and regular exercise.  Prescriptions: METRONIDAZOLE 500 MG TABS (METRONIDAZOLE) Take 1 tablet by mouth two times a day  #14 x 0   Entered and Authorized by:    Syliva Overman MD   Signed by:   Syliva Overman MD on 10/23/2010   Method used:   Historical   RxID:   [id number] BETAMETHASONE VALERATE 0.1 % CREA (BETAMETHASONE VALERATE) apply twice daiily for 1 week , then as needed to rashes  #45 gm x 0   Entered and Authorized by:   Syliva Overman MD   Signed by:   Syliva Overman MD on 10/18/2010   Method used:   Electronically to        [facility] (retail)       [address]       [place], [place]  [address]       Ph: [phone]       Fax: [phone]   RxID:   [id number]    Orders Added: 1)  Est. Patient Level IV [[address]] 2)  T-Hepatic Function [[id number]] 3)  T-Wet Prep by Molecular Probe [[id number]] 4)  T-Chlamydia & GC Probe,  Genital [[id number]]

## 2010-11-23 LAB — CONVERTED CEMR LAB
ALT: 12 units/L (ref 0–35)
AST: 16 units/L (ref 0–37)
Indirect Bilirubin: 0.5 mg/dL (ref 0.0–0.9)

## 2010-11-28 ENCOUNTER — Ambulatory Visit: Payer: Self-pay | Admitting: Family Medicine

## 2010-12-21 ENCOUNTER — Telehealth: Payer: Self-pay | Admitting: Family Medicine

## 2010-12-27 NOTE — Progress Notes (Signed)
Summary: refill  Phone Note Call from Patient   Summary of Call: needs a refill [facility]. [phone]  Initial call taken by: Rudene Anda,  December 21, 2010 4:05 PM    Prescriptions: AMLODIPINE BESYLATE 5 MG  TABS (AMLODIPINE BESYLATE) one tab by mouth once daily  #90 Each x 0   Entered by:   Adella Hare LPN   Authorized by:   Syliva Overman MD   Signed by:   Adella Hare LPN on 14/78/2956   Method used:   Electronically to        [facility] (retail)       [address]       [place], [place]  [address]       Ph: [phone]       Fax: [phone]   RxID:   [id number]

## 2011-02-24 NOTE — Op Note (Signed)
[name], Allison Hart              ACCOUNT NO.:  [id number]   MEDICAL RECORD NO.:  [id number]          PATIENT TYPE:  AMB   LOCATION:  DAY                           FACILITY:  [facility]   PHYSICIAN:  Lionel December, M.D.    DATE OF BIRTH:  1961-07-17   DATE OF PROCEDURE:  02/13/2005  DATE OF DISCHARGE:                                 OPERATIVE REPORT   PROCEDURE:  Esophagogastroduodenoscopy.   Allison Hart is a 50 year old Afro-American female with six months' history of the  GERD presenting with coughing and bringing up phlegm. She was seen by Dr.  Truman Hayward of  ENT in [place] and confirmed to have LPR. She is been  maintained on Nexium. Follow-up by Dr. Katrinka Blazing did not show any improvement;  however, symptomatically she is about 50% better. She is now on Nexium 40 mg  b.i.d. She is undergoing EGD to make sure she does not have Barrett's or a  large hiatal hernia in which case she may require a different treatment  approach.   Procedure risks were reviewed with the patient, and informed consent was  obtained.   PREMEDICATION:  Cetacaine spray for pharyngeal topical anesthesia, Demerol  25 mg IV, Versed 7 mg IV in divided dose.   FINDINGS:  Procedure performed in endoscopy suite. The patient's vital signs  and O2 saturation were monitored during the procedure and remained stable.  The patient was placed in left lateral position, and Olympus videoscope was  passed via oropharynx without any difficulty into esophagus.   Esophagus. Mucosa of the esophagus was normal throughout. GE junction was at  40 cm and hiatus was 42. She had a small sliding hiatal hernia.   Stomach. It was empty and distended very well insufflation. Folds of  proximal stomach were normal. Examination of mucosa at body, antrum, pyloric  channel as well as angularis, fundus and cardia was normal.   Duodenum. Bulbar mucosa was normal. Scope was passed to the second part of  duodenum where mucosa and folds were normal.  Endoscope was withdrawn. The  patient tolerated the procedure well.   FINAL DIAGNOSIS:  Small sliding hiatal hernia otherwise normal EGD. No  evidence of Barrett's esophagus.   RECOMMENDATIONS:  She will continue antireflux measures and Nexium at 40 mg  p.o. b.i.d. She will return for OV in 12 weeks. Unless symptoms are  effectively controlled, may switch her to another PPI.      NR/MEDQ  D:  02/13/2005  T:  02/13/2005  Job:  [id number]   cc:   Truman Hayward, M.D.  [place], [place]

## 2011-02-24 NOTE — H&P (Signed)
NAMEBIBI, ECONOMOS NO.:  [id number]   MEDICAL RECORD NO.:  [id number]           PATIENT TYPE:   LOCATION:                                 FACILITY:   PHYSICIAN:  Dalia Heading, M.D.  DATE OF BIRTH:  23-Dec-1960   DATE OF ADMISSION:  DATE OF DISCHARGE:  LH                                HISTORY & PHYSICAL   CHIEF COMPLAINT:  Hematochezia.   HISTORY OF PRESENT ILLNESS:  The patient is a 50 year old black female who  is referred for endoscopic evaluation.  She needs colonoscopy for  hematochezia.  She was found to be Hemoccult positive on a recent rectal  examination.  No abdominal pain, weight loss, nausea, vomiting, diarrhea,  constipation, or melena have been noted.  She has never had a colonoscopy.  There is no family history of colon carcinoma.  There is no history of  hemorrhoidal disease.   PAST MEDICAL HISTORY:  Hypertension.   PAST SURGICAL HISTORY:  1.  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  2.  Vaginal childbirth.   CURRENT MEDICATIONS:  1.  Avalide.  2.  Lotrel.   ALLERGIES:  No known drug allergies.   REVIEW OF SYSTEMS:  The patient has not drank or smoke in over one month.  She denies any other cardiopulmonary difficulties or bleeding disorders.   PHYSICAL EXAMINATION:  GENERAL:  The patient is a well-developed, well-  nourished black female in no acute distress.  VITAL SIGNS:  Afebrile, vital signs are stable.  LUNGS:  Clear to auscultation with equal breath sounds bilaterally.  HEART:  Regular rate and rhythm without S3, S4, or murmurs.  ABDOMEN:  Soft, nontender, nondistended.  No hepatosplenomegaly or masses  are noted.  RECTAL:  Deferred to the procedure.   IMPRESSION:  Hematochezia.   PLAN:  The patient is scheduled for a colonoscopy on August 29, 2004.  The  risks and benefits of the procedure including bleeding and perforation were  fully explained to the patient, who gave informed consent.     Mark   MAJ/MEDQ  D:  08/18/2004  T:  08/18/2004  Job:  [id number]   cc:   Milus Mallick. Lodema Hong, M.D.  [address]  [place], [place] [address]  Fax: [phone]

## 2011-02-24 NOTE — Procedures (Signed)
NAMETALETHA, TWIFORD NO.:  [id number]   MEDICAL RECORD NO.:  [id number]          PATIENT TYPE:  OUT   LOCATION:  RESP                          FACILITY:  [facility]   PHYSICIAN:  Edward L. Juanetta Gosling, M.D.DATE OF BIRTH:  [birth year]   DATE OF PROCEDURE:  DATE OF DISCHARGE:  11/07/2004                              PULMONARY FUNCTION TEST   Patient of Dr. Anthony Sar   PROCEDURE:  Pulmonary function test.   INTERPRETATION:  1.  Spirometry shows no definite ventilatory defect, but does show what      appears to be air flow obstruction at the level of the smaller airways.  2.  Lung volumes show total lung capacity of 80% which is normal.  3.  DLCO is moderately reduced.  4.  Arterial blood gases are normal.      ELH/MEDQ  D:  11/09/2004  T:  11/09/2004  Job:  [id number]   cc:   Milus Mallick. Lodema Hong, M.D.  [address]  [place], [place] [address]  Fax: [phone]

## 2011-02-24 NOTE — Consult Note (Signed)
Allison Hart, Allison Hart              ACCOUNT NO.:  [id number]   MEDICAL RECORD NO.:  [id number]          PATIENT TYPE:  AMB   LOCATION:                                 FACILITY:   PHYSICIAN:  Lionel December, M.D.    DATE OF BIRTH:  10/02/1961   DATE OF CONSULTATION:  02/07/2005  DATE OF DISCHARGE:                                   CONSULTATION   REASON FOR CONSULTATION:  LPR.   HISTORY OF PRESENT ILLNESS:  The patient is a 50 year old African-American  female who six months ago began having chronic cough and tickling in the  back of her throat.  She was also coughing up clear phlegm all day long.  She was seen by ear, nose, and throat physician, Dr. Truman Hayward in [place].  She was felt to have a normal ENT exam except for changes of chronic GERD  with  LPR.  She was started on Nexium about six weeks ago and notes moderate  relief from her symptoms, about 50%. She also began some lifestyle changes  and modified her diet.  She denies dysphagia or odynophagia. She denies  anorexia.  She denies any noticeable heartburn, ingestion, or regurgitation.  She has had decreased coughing episodes and less frequent throat clearing  since starting on PPI.   PAST MEDICAL HISTORY:  1.  Hypertension.  2.  Obesity.  3.  Dyslipidemia.  4.  Colonoscopy by Dr. Lovell Sheehan in December 2005 which was normal.  5.  Left parotid gland surgery.   CURRENT MEDICATIONS:  1.  Nexium 40 mg p.o. b.i.d.  2.  Norvasc 10 mg q.h.s.  3.  Avalide 300/25 mg daily.  4.  Singulair 10 mg daily.  5.  Astelin 2 sprays per nostril b.i.d.  6.  Nasacort 2 sprays daily.  7.  BC powders p.r.n.   ALLERGIES:  No known drug allergies.   FAMILY HISTORY:  She does have one son, age 74, with ulcerative colitis,  diagnosed at age 16.  Both parents are alive and healthy.  She has two  sisters and one brother, all of whom are healthy.   SOCIAL HISTORY:  Married for 16 years.  She is a full-time Tree surgeon, self  employed.  She  reports about a 15-pack-year history of tobacco use, quitting  in October 2005.  She did have moderate alcohol use of about two or three  beers a day and discontinued this about seven months ago as well.  Denies  any drug use.   REVIEW OF SYSTEMS:  CONSTITUTIONAL:  Denies fatigue, denies fevers or  chills, denies insomnia except for frequent coughing spells which have  become less in recent weeks.  HEENT:  Denies sore throat or ear pain or  otalgia.  PULMONARY:  She has been evaluated by Dr. Juanetta Gosling.  She had  pulmonary function tests which were normal.  Chest x-ray normal, and sinus  CT normal.  She was also seen by an allergist with a negative exam.  GI:  See HPI.  She is having bowel movements about twice a week, has history of  chronic constipation,  uses occasional Correctol.  Denies rectal bleed or  melena.   PHYSICAL EXAMINATION:  VITAL SIGNS:  Weight 210.5 pounds, height 70 inches.  Temperature 98.5, blood pressure 156/70, pulse 72.  GENERAL:  A 50 year old African-American female who is alert and oriented,  pleasant, cooperative, in no acute distress.  HEENT:  Sclerae clear, not icteric.  Conjunctivae pink.  Oropharynx pink and  moist without lesions.  NECK:  Supple without thyromegaly.  HEART:  Regular rate and rhythm.  S1 and S2 without murmurs, gallops, rubs.  LUNGS:  Clear to auscultation bilaterally.  ABDOMEN:  Positive bowel sounds x 4.  No bruits auscultated.  Soft,  nontender, nondistended, without palpable mass or hepatosplenomegaly. No  rebound tenderness or guarding.  RECTAL:  Deferred.  EXTREMITIES:  No edema bilaterally.   IMPRESSION:  The patient is a 50 year old African-American female with  suspected silent gastroesophageal reflux disease and LPR.  She had frequent  throat clearing and chronic cough which has responded about 50% on Nexium  twice daily.  Further evaluation is warranted to determine if her chronic  cough is, in fact, caused by LPR.  She has  made great strides with lifestyle  modification and hopefully will continue to do better on PPI therapy.   She also has chronic constipation without any warning signs and normal  colonoscopy by Dr. Lovell Sheehan in 2005.   RECOMMENDATIONS:  1.  Schedule EGD with Dr. Karilyn Cota in the near future.  Discussed this      procedure, risks, and benefits to include but not limited to bleeding,      infection, perforation, drug reaction.  She agrees with this plan.      Consent will be obtained.  2.  GERD literature was given for her review.  3.  I have given her two weeks of Nexium 40 mg b.i.d. samples.  4.  Further recommendations pending procedure.   We would like to thank Dr. Lodema Hong for allowing [place] to participate in the  care of this patient.      KC/MEDQ  D:  02/07/2005  T:  02/07/2005  Job:  [id number]

## 2011-02-24 NOTE — Op Note (Signed)
NAMEFAITH, PATRICELLI NO.:  [id number]   MEDICAL RECORD NO.:  [id number]          PATIENT TYPE:  AMB   LOCATION:  SDS                          FACILITY:  [facility]   PHYSICIAN:  Leonie Man, M.D.   DATE OF BIRTH:  11-03-1960   DATE OF PROCEDURE:  01/24/2006  DATE OF DISCHARGE:  01/24/2006                                 OPERATIVE REPORT   PREOPERATIVE DIAGNOSIS:  Incarcerated left inguinal hernia.   POSTOPERATIVE DIAGNOSIS:  Incarcerated left inguinal hernia with  endometrioma.   SURGEON:  Leonie Man, M.D.   ASSISTANT:  OR tech.   ANESTHESIA:  General.   SPECIMENS TO LAB:  Hernia sac with endometrioma.   ESTIMATED BLOOD LOSS:  Minimal.   COMPLICATIONS:  None.  Patient returned to PACU in excellent condition.   NOTE:  Ms. Matzen is a 50 year old patient with a left-sided pudendal  pelvic mass as well as known uterine fibroids.  The patient underwent  vascular ablation of her fibroids approximately three weeks ago.  She had  had a mass in her left groin and pudenda which fluctuates increasing pain  during her menstrual cycle.  The MRI scan of this area showed there to have  been an inguinal hernia containing fat and some evidence of inflammation and  fat necrosis.  She comes to the operating room now after risks and potential  benefits of surgery have been discussed.  All questions answered and consent  obtained.   PROCEDURE:  Following induction of satisfactory general anesthesia with the  patient positioned supinely, the left groin is prepped and draped to be  included a sterile operative field.  I infiltrated the lower abdominal  crease with 0.25% Marcaine with epinephrine.  Transverse incision was made  carried down through skin and subcutaneous tissues to the external oblique  aponeurosis.  External oblique aponeurosis opened up through the external  inguinal ring.  Dissection carried down to the region of the pudenda where a  large inflamed  mass was dissected free carefully taking with at the pubic  attachment of the round ligament and elevating the entire mass and carrying  dissection up to the internal ring.  The round ligament at this point is  transected at the internal ring and doubly ligated with 2-0 silk sutures.  Mass was forwarded for pathologic evaluation and on further dissection was  noted to be endometriosis.   The floor of the inguinal canal was then repaired with an onlay patch of  polypropylene mesh sewn at the pubic tubercle, carried up along conjoined  tendon with 2-0 Novofil suture completely obliterating the internal ring and  again from the pubic tubercle up along the shelving edge of Poupart's  ligament to the obliteration of the internal ring of.  All areas of  dissection checked for hemostasis.  Sponge and instrument counts verified.  External oblique aponeurosis closed over the mesh with a running 2-0 Vicryl  suture.  The Scarpa fascia closed with running 3-0  Vicryl suture and the skin closed with running 4-0 Monocryl suture and then  reinforced with Steri-Strips.  Sterile dressings applied.  Anesthetic  reversed.  The patient removed from the operating room to recovery room in  stable condition.  She tolerated the procedure well.      Leonie Man, M.D.  Electronically Signed     PB/MEDQ  D:  01/24/2006  T:  01/25/2006  Job:  [id number]

## 2011-03-07 ENCOUNTER — Other Ambulatory Visit: Payer: Self-pay

## 2011-03-07 ENCOUNTER — Telehealth: Payer: Self-pay | Admitting: Family Medicine

## 2011-03-07 MED ORDER — TRIAMTERENE-HCTZ 37.5-25 MG PO TABS: 1.0000 | ORAL_TABLET | Freq: Every day | ORAL | Status: DC

## 2011-03-07 MED ORDER — AMLODIPINE BESYLATE 5 MG PO TABS: 5.0000 mg | ORAL_TABLET | Freq: Every day | ORAL | Status: DC

## 2011-03-07 NOTE — Telephone Encounter (Signed)
Refills sent as requested

## 2011-03-17 ENCOUNTER — Encounter: Payer: Self-pay | Admitting: Family Medicine

## 2011-03-21 ENCOUNTER — Ambulatory Visit (INDEPENDENT_AMBULATORY_CARE_PROVIDER_SITE_OTHER): Payer: BC Managed Care – PPO | Admitting: Family Medicine

## 2011-03-21 ENCOUNTER — Encounter: Payer: Self-pay | Admitting: Family Medicine

## 2011-03-21 DIAGNOSIS — I10 Essential (primary) hypertension: Secondary | ICD-10-CM

## 2011-03-21 DIAGNOSIS — F439 Reaction to severe stress, unspecified: Secondary | ICD-10-CM

## 2011-03-21 DIAGNOSIS — R5381 Other malaise: Secondary | ICD-10-CM

## 2011-03-21 DIAGNOSIS — M25559 Pain in unspecified hip: Secondary | ICD-10-CM

## 2011-03-21 DIAGNOSIS — IMO0002 Reserved for concepts with insufficient information to code with codable children: Secondary | ICD-10-CM

## 2011-03-21 DIAGNOSIS — E669 Obesity, unspecified: Secondary | ICD-10-CM

## 2011-03-21 DIAGNOSIS — E785 Hyperlipidemia, unspecified: Secondary | ICD-10-CM

## 2011-03-21 DIAGNOSIS — Z733 Stress, not elsewhere classified: Secondary | ICD-10-CM

## 2011-03-21 DIAGNOSIS — R7301 Impaired fasting glucose: Secondary | ICD-10-CM

## 2011-03-21 MED ORDER — PREDNISONE (PAK) 5 MG PO TABS: 5.0000 mg | ORAL_TABLET | ORAL | Status: AC

## 2011-03-21 MED ORDER — IBUPROFEN 800 MG PO TABS: 800.0000 mg | ORAL_TABLET | Freq: Three times a day (TID) | ORAL | Status: AC | PRN

## 2011-03-21 NOTE — Patient Instructions (Signed)
CPE in end Sept  Or October   Fasting labs just before  Next visit  Med  ( depomedrol) is administered in the office for back pain due to disc disease, and also sent  In to the pharmacy   pls call if symptoms do not go away  pls consider individual counselingling  bP 130/92

## 2011-03-22 DIAGNOSIS — F439 Reaction to severe stress, unspecified: Secondary | ICD-10-CM | POA: Insufficient documentation

## 2011-03-22 MED ORDER — METHYLPREDNISOLONE ACETATE 80 MG/ML IJ SUSP
80.0000 mg | Freq: Once | INTRAMUSCULAR | Status: AC
  Administered 2011-03-22: 80 mg via INTRAMUSCULAR

## 2011-03-22 NOTE — Assessment & Plan Note (Signed)
Controlled, no change in medication  

## 2011-03-22 NOTE — Assessment & Plan Note (Signed)
Acute flare, oral anti inflammatories, and injection in office

## 2011-03-22 NOTE — Assessment & Plan Note (Signed)
Low fat diet encouraged.  

## 2011-03-22 NOTE — Assessment & Plan Note (Signed)
Improved. Pt applauded on succesful weight loss through lifestyle change, and encouraged to continue same. Weight loss goal set for the next several months.  

## 2011-03-22 NOTE — Progress Notes (Signed)
  Subjective:    Patient ID: Allison Hart, female    DOB: [birth year], 50 y.o.   MRN: [id number]  HPI The PT is here for follow up and re-evaluation of chronic medical conditions, medication management and review of recent lab and radiology data.  Preventive health is updated, specifically  Cancer screening, and Immunization.   Questions or concerns regarding consultations or procedures which the PT has had in the interim are  addressed. The PT denies any adverse reactions to current medications since the last visit.  She has a 1 week h/o low back pain radiating to left groin and hip and buttock, she does have established disc disease with nerve compression. No aggravating factor known,denies weakness or numbness in lower ext, mobility limited by pain, no incontinence of stool or urine. 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 No symptoms of depression, not suicidal or homicidal. Essentially angry, hurt  And confused     Review of Systems Denies recent fever or chills. Denies sinus pressure, nasal congestion, ear pain or sore throat. Denies chest congestion, productive cough or wheezing. Denies chest pains, palpitations, paroxysmal nocturnal dyspnea, orthopnea and leg swelling Denies abdominal pain, nausea, vomiting,diarrhea or constipation.  Denies rectal bleeding or change in bowel movement. Denies dysuria, frequency, hesitancy or incontinence.Reportsivag d/c primarily pre menstrual, sometimes malododros, none currently  Denies headaches, seizure, numbness, or tingling. Denies skin break down or rash.        Objective:   Physical Exam Patient alert and oriented and in no Cardiopulmonary distress.  HEENT: No facial asymmetry, EOMI, no sinus tenderness,  Oropharynx pink and moist.  Neck supple no adenopathy.  Chest: Clear to auscultation bilaterally.  CVS: S1, S2 no murmurs, no S3.  ABD: Soft  non tender. Bowel sounds normal.  Ext: No edema  MS: decreased  ROM lumbar  Spine,adequate in  shoulders, and knees.  Skin: Intact, no ulcerations or rash noted.  Psych: Good eye contact, normal affect. Memory intact  Mildly  anxious not depressed appearing.  CNS: CN 2-12 intact, power, tone and sensation normal throughout.        Assessment & Plan:

## 2011-03-22 NOTE — Assessment & Plan Note (Signed)
Significantly stressed due to infidelity in her spouse repeatedly, uncertain how to handle the situation, holding off on professional counseling now, however I believe she needs this

## 2011-06-15 LAB — BASIC METABOLIC PANEL
BUN: 14 mg/dL (ref 6–23)
CO2: 28 mEq/L (ref 19–32)
Calcium: 9.1 mg/dL (ref 8.4–10.5)
Chloride: 101 mEq/L (ref 96–112)
Creat: 0.65 mg/dL (ref 0.50–1.10)
Glucose, Bld: 78 mg/dL (ref 70–99)
Sodium: 139 mEq/L (ref 135–145)

## 2011-06-15 LAB — LIPID PANEL
Cholesterol: 203 mg/dL — ABNORMAL HIGH (ref 0–200)
LDL Cholesterol: 141 mg/dL — ABNORMAL HIGH (ref 0–99)
Total CHOL/HDL Ratio: 4.1 Ratio
VLDL: 13 mg/dL (ref 0–40)

## 2011-06-15 LAB — CBC WITH DIFFERENTIAL/PLATELET
Eosinophils Relative: 7 % — ABNORMAL HIGH (ref 0–5)
HCT: 39.1 % (ref 36.0–46.0)
MCHC: 31.2 g/dL (ref 30.0–36.0)
MCV: 92.9 fL (ref 78.0–100.0)
Neutrophils Relative %: 55 % (ref 43–77)
Platelets: 225 10*3/uL (ref 150–400)
RBC: 4.21 MIL/uL (ref 3.87–5.11)
RDW: 13 % (ref 11.5–15.5)

## 2011-06-23 ENCOUNTER — Encounter: Payer: Self-pay | Admitting: Family Medicine

## 2011-06-26 ENCOUNTER — Encounter: Payer: Self-pay | Admitting: Family Medicine

## 2011-06-26 ENCOUNTER — Other Ambulatory Visit (HOSPITAL_COMMUNITY)
Admission: RE | Admit: 2011-06-26 | Discharge: 2011-06-26 | Disposition: A | Discharge status: Home / Self Care | Payer: BC Managed Care – PPO | Source: Ambulatory Visit | Attending: Family Medicine | Admitting: Family Medicine

## 2011-06-26 ENCOUNTER — Ambulatory Visit (INDEPENDENT_AMBULATORY_CARE_PROVIDER_SITE_OTHER): Payer: BC Managed Care – PPO | Admitting: Family Medicine

## 2011-06-26 VITALS — BP 118/88 | HR 80 | Resp 16 | Ht 70.0 in | Wt 218.0 lb

## 2011-06-26 DIAGNOSIS — I10 Essential (primary) hypertension: Secondary | ICD-10-CM

## 2011-06-26 DIAGNOSIS — E785 Hyperlipidemia, unspecified: Secondary | ICD-10-CM

## 2011-06-26 DIAGNOSIS — Z1211 Encounter for screening for malignant neoplasm of colon: Secondary | ICD-10-CM

## 2011-06-26 DIAGNOSIS — E669 Obesity, unspecified: Secondary | ICD-10-CM

## 2011-06-26 DIAGNOSIS — Z01419 Encounter for gynecological examination (general) (routine) without abnormal findings: Secondary | ICD-10-CM | POA: Insufficient documentation

## 2011-06-26 DIAGNOSIS — N760 Acute vaginitis: Secondary | ICD-10-CM

## 2011-06-26 DIAGNOSIS — Z0000 Encounter for general adult medical examination without abnormal findings: Secondary | ICD-10-CM

## 2011-06-26 DIAGNOSIS — Z124 Encounter for screening for malignant neoplasm of cervix: Secondary | ICD-10-CM

## 2011-06-26 LAB — POC HEMOCCULT BLD/STL (OFFICE/1-CARD/DIAGNOSTIC): Fecal Occult Blood, POC: NEGATIVE

## 2011-06-26 NOTE — Patient Instructions (Addendum)
F/u in 5.5 months.  It is important that you exercise regularly at least 30 minutes 5 times a week. If you develop chest pain, have severe difficulty breathing, or feel very tired, stop exercising immediately and seek medical attention   A healthy diet is rich in fruit, vegetables and whole grains. Poultry fish, nuts and beans are a healthy choice for protein rather then red meat. A low sodium diet and drinking 64 ounces of water daily is generally recommended. Oils and sweet should be limited. Carbohydrates especially for those who are diabetic or overweight, should be limited to 30-45 gram per meal. It is important to eat on a regular schedule, at least 3 times daily. Snacks should be primarily fruits, vegetables or nuts.  Weight loss goal of 2 to 3 pounds per month.   `Fasting lipid, and chem 7 in 5.5 months LABWORK  NEEDS TO BE DONE BETWEEN 3 TO 7 DAYS BEFORE YOUR NEXT SCEDULED  VISIT.  THIS WILL IMPROVE THE QUALITY OF YOUR CARE.

## 2011-06-26 NOTE — Assessment & Plan Note (Signed)
Deteriorated. Patient re-educated about  the importance of commitment to a  minimum of 150 minutes of exercise per week. The importance of healthy food choices with portion control discussed. Encouraged to start a food diary, count calories and to consider  joining a support group. Sample diet sheets offered. Goals set by the patient for the next several months.    

## 2011-06-26 NOTE — Assessment & Plan Note (Signed)
Controlled, no change in medication  

## 2011-06-26 NOTE — Progress Notes (Signed)
  Subjective:    Patient ID: Allison Hart, female    DOB: June 12, 1961, 50 y.o.   MRN: [id number]  HPI The PT is here for annual exam and re-evaluation of chronic medical conditions, medication management and review of any available recent lab and radiology data.  Preventive health is updated, specifically  Cancer screening and Immunization.   She is concerned about her weight and intends to work on this agresively. Her mental health has improved since her l;ast visit    Review of Systems    Denies recent fever or chills. Denies sinus pressure, nasal congestion, ear pain or sore throat. Denies chest congestion, productive cough or wheezing. Denies chest pains, palpitations and leg swelling Denies abdominal pain, nausea, vomiting,diarrhea or constipation.   Denies dysuria, frequency, hesitancy or incontinence. Denies joint pain, swelling and limitation in mobility. Denies headaches, seizures, numbness, or tingling. Denies depression, anxiety or insomnia. Denies skin break down or rash.     Objective:   Physical Exam Pleasant well nourished female, alert and oriented x 3, in no cardio-pulmonary distress. Afebrile. HEENT No facial trauma or asymetry. Sinuses non tender.  EOMI, PERTL, fundoscopic exam is normal, no hemorhage or exudate.  External ears normal, tympanic membranes clear. Oropharynx moist, no exudate, good dentition. Neck: supple, no adenopathy,JVD or thyromegaly.No bruits.  Chest: Clear to ascultation bilaterally.No crackles or wheezes. Non tender to palpation  Breast: No asymetry,no masses. No nipple discharge or inversion. No axillary or supraclavicular adenopathy  Cardiovascular system; Heart sounds normal,  S1 and  S2 ,no S3.  No murmur, or thrill. Apical beat not displaced Peripheral pulses normal.  Abdomen: Soft, non tender, no organomegaly or masses. No bruits. Bowel sounds normal. No guarding, tenderness or rebound.  Rectal:  No mass.  Guaiac negative stool.  GU: External genitalia normal. No lesions. Vaginal canal normal.No discharge. Uterus normal size, no adnexal masses, no cervical motion or adnexal tenderness.  Musculoskeletal exam: Full ROM of spine, hips , shoulders and knees. No deformity ,swelling or crepitus noted. No muscle wasting or atrophy.   Neurologic: Cranial nerves 2 to 12 intact. Power, tone ,sensation and reflexes normal throughout. No disturbance in gait. No tremor.  Skin: Intact, no ulceration, erythema , scaling or rash noted. Pigmentation normal throughout  Psych; Normal mood and affect. Judgement and concentration normal        Assessment & Plan:

## 2011-06-27 ENCOUNTER — Other Ambulatory Visit: Payer: Self-pay

## 2011-06-27 MED ORDER — TERBINAFINE HCL 250 MG PO TABS: ORAL_TABLET | ORAL | Status: DC

## 2011-06-28 LAB — WET PREP BY MOLECULAR PROBE
Candida species: NEGATIVE
Gardnerella vaginalis: POSITIVE — AB
Trichomonas vaginosis: NEGATIVE

## 2011-06-30 ENCOUNTER — Other Ambulatory Visit: Payer: Self-pay

## 2011-06-30 MED ORDER — METRONIDAZOLE 500 MG PO TABS: 500.0000 mg | ORAL_TABLET | Freq: Two times a day (BID) | ORAL | Status: AC

## 2011-06-30 MED ORDER — FLUCONAZOLE 150 MG PO TABS: 150.0000 mg | ORAL_TABLET | Freq: Once | ORAL | Status: AC

## 2011-07-03 ENCOUNTER — Telehealth: Payer: Self-pay | Admitting: Family Medicine

## 2011-07-04 ENCOUNTER — Other Ambulatory Visit: Payer: Self-pay | Admitting: Family Medicine

## 2011-07-04 NOTE — Telephone Encounter (Signed)
error 

## 2011-07-25 ENCOUNTER — Telehealth: Payer: Self-pay | Admitting: Family Medicine

## 2011-07-26 NOTE — Telephone Encounter (Signed)
Not a pt here if not seen in 3 years cannot just do skin test if pt not here, try health dept. If it is her son camden that's ok I have seen him recently enough, I am not clear who the pt is

## 2011-07-26 NOTE — Telephone Encounter (Signed)
Pt not an active pt with this practice nees to go to other facility eg health dept pls explain

## 2011-07-27 NOTE — Telephone Encounter (Signed)
Patient is aware 

## 2011-07-27 NOTE — Telephone Encounter (Signed)
Left message

## 2011-08-01 ENCOUNTER — Other Ambulatory Visit: Payer: Self-pay | Admitting: Family Medicine

## 2011-11-06 ENCOUNTER — Other Ambulatory Visit: Payer: Self-pay | Admitting: Family Medicine

## 2011-12-25 ENCOUNTER — Ambulatory Visit: Payer: BC Managed Care – PPO | Admitting: Family Medicine

## 2012-02-08 ENCOUNTER — Telehealth: Payer: Self-pay | Admitting: Family Medicine

## 2012-02-08 MED ORDER — AMLODIPINE BESYLATE 5 MG PO TABS: ORAL_TABLET | ORAL | Status: DC

## 2012-02-08 MED ORDER — TRIAMTERENE-HCTZ 37.5-25 MG PO TABS: ORAL_TABLET | ORAL | Status: DC

## 2012-02-08 NOTE — Telephone Encounter (Signed)
Sent in x 1 month only because she has OV next week

## 2012-02-12 ENCOUNTER — Other Ambulatory Visit: Payer: Self-pay

## 2012-02-12 ENCOUNTER — Telehealth: Payer: Self-pay | Admitting: Family Medicine

## 2012-02-12 MED ORDER — TRIAMTERENE-HCTZ 37.5-25 MG PO TABS: ORAL_TABLET | ORAL | Status: DC

## 2012-02-12 MED ORDER — AMLODIPINE BESYLATE 5 MG PO TABS: ORAL_TABLET | ORAL | Status: DC

## 2012-02-12 NOTE — Telephone Encounter (Signed)
meds resent to [facility].

## 2012-02-21 ENCOUNTER — Ambulatory Visit (INDEPENDENT_AMBULATORY_CARE_PROVIDER_SITE_OTHER): Payer: BC Managed Care – PPO | Admitting: Family Medicine

## 2012-02-21 ENCOUNTER — Encounter: Payer: Self-pay | Admitting: Family Medicine

## 2012-02-21 VITALS — BP 130/82 | HR 92 | Resp 18 | Ht 70.0 in | Wt 215.1 lb

## 2012-02-21 DIAGNOSIS — R7301 Impaired fasting glucose: Secondary | ICD-10-CM

## 2012-02-21 DIAGNOSIS — E8881 Metabolic syndrome: Secondary | ICD-10-CM

## 2012-02-21 DIAGNOSIS — R0789 Other chest pain: Secondary | ICD-10-CM

## 2012-02-21 DIAGNOSIS — I10 Essential (primary) hypertension: Secondary | ICD-10-CM

## 2012-02-21 DIAGNOSIS — R079 Chest pain, unspecified: Secondary | ICD-10-CM

## 2012-02-21 DIAGNOSIS — E785 Hyperlipidemia, unspecified: Secondary | ICD-10-CM

## 2012-02-21 DIAGNOSIS — J309 Allergic rhinitis, unspecified: Secondary | ICD-10-CM | POA: Insufficient documentation

## 2012-02-21 MED ORDER — LORATADINE 10 MG PO TABS: 10.0000 mg | ORAL_TABLET | Freq: Every day | ORAL | Status: DC

## 2012-02-21 NOTE — Assessment & Plan Note (Signed)
Controlled, no change in medication  

## 2012-02-21 NOTE — Patient Instructions (Addendum)
CPE September 18 or after  Please schedule next mammogram at [facility] when due as discussed  Fasting lipid, chem 7 and HBa1C as soon as possible  You will be referred to cardiology as discussed for evaluation of intermittent chest pain and increased exertional fatigue. Marland Kitchen  Please work consistently on lifestyle changes to improve your health  Through improved lipids and weight loss  A healthy diet is rich in fruit, vegetables and whole grains. Poultry fish, nuts and beans are a healthy choice for protein rather then red meat. A low sodium diet and drinking 64 ounces of water daily is generally recommended. Oils and sweet should be limited. Carbohydrates especially for those who are diabetic or overweight, should be limited to 30-45 gram per meal. It is important to eat on a regular schedule, at least 3 times daily. Snacks should be primarily fruits, vegetables or nuts.  It is important that you exercise regularly at least 30 minutes 5 times a week. If you develop chest pain, have severe difficulty breathing, or feel very tired, stop exercising immediately and seek medical attention   Weight loss goal of 2 to 3 pounds per month  Generic claritin script is sent to [facility] in [place] per your request.  You will get information on how to make flush for nostrils

## 2012-02-21 NOTE — Progress Notes (Signed)
  Subjective:    Patient ID: Allison Hart, female    DOB: [birth year], 51 y.o.   MRN: [id number]  HPI The PT is here for follow up and re-evaluation of chronic medical conditions, medication management and review of any available recent lab and radiology data.  Preventive health is updated, specifically  Cancer screening and Immunization.   Questions or concerns regarding consultations or procedures which the PT has had in the interim are  addressed. The PT denies any adverse reactions to current medications since the last visit.  3 month h/o intermittent left chest tightness, with or without activity, radiates to left arm, no associated nausea, does have intermittent light headedness. She is concerned about heart disease`, states it is "prevalent in he family" had a stress test approx 2 years ago, which was limited Still not committed to healthy lifestyle and concerned about her weight, plans to work on this. C/o increased and uncontrolled allergies, has excessive clear nasal drainage wioh mild sinus pressure and cough, on no meds regualrly      Review of Systems See HPI Denies recent fever or chills.  Denies abdominal pain, nausea, vomiting,diarrhea or constipation.   Denies dysuria, frequency, hesitancy or incontinence. Denies joint pain, swelling and limitation in mobility. Denies headaches, seizures, numbness, or tingling. Denies depression, anxiety or insomnia. Denies skin break down or rash.        Objective:   Physical Exam Patient alert and oriented and in no cardiopulmonary distress.  HEENT: No facial asymmetry, EOMI, no sinus tenderness,  oropharynx pink and moist.  Neck supple no adenopathy.Erythema and edema of nasal mucosa  Chest: Clear to auscultation bilaterally.No reproducible tenderness  CVS: S1, S2 no murmurs, no S3. EKG: abnormal, possible old ischemia  ABD: Soft non tender. Bowel sounds normal.  Ext: No edema  MS: Adequate ROM spine, shoulders,  hips and knees.  Skin: Intact, no ulcerations or rash noted.  Psych: Good eye contact, normal affect. Memory intact not anxious or depressed appearing.  CNS: CN 2-12 intact, power, tone and sensation normal throughout.        Assessment & Plan:

## 2012-02-22 DIAGNOSIS — E8881 Metabolic syndrome: Secondary | ICD-10-CM | POA: Insufficient documentation

## 2012-02-22 DIAGNOSIS — R0789 Other chest pain: Secondary | ICD-10-CM | POA: Insufficient documentation

## 2012-02-22 LAB — BASIC METABOLIC PANEL
Chloride: 102 mEq/L (ref 96–112)
Creat: 0.67 mg/dL (ref 0.50–1.10)
Potassium: 4.1 mEq/L (ref 3.5–5.3)

## 2012-02-22 LAB — LIPID PANEL
LDL Cholesterol: 140 mg/dL — ABNORMAL HIGH (ref 0–99)
VLDL: 13 mg/dL (ref 0–40)

## 2012-02-22 NOTE — Assessment & Plan Note (Signed)
Uncontrolled, currently on no medication, will start same, also saline flushes

## 2012-02-22 NOTE — Assessment & Plan Note (Signed)
The importance of lifestyle change stressed to improve heaalth

## 2012-02-22 NOTE — Assessment & Plan Note (Signed)
Abnormal ekg in office, pt has metabolic synd  and HTn and dyslipidemia, will refer for card re eval. C/o new onset exertional dyspnea in past several month also

## 2012-02-23 LAB — HEMOGLOBIN A1C
Hgb A1c MFr Bld: 5.5 % (ref <5.7)
Mean Plasma Glucose: 111 mg/dL (ref <117)

## 2012-03-06 ENCOUNTER — Encounter: Payer: Self-pay | Admitting: Family Medicine

## 2012-03-21 ENCOUNTER — Encounter: Payer: Self-pay | Admitting: *Deleted

## 2012-03-21 ENCOUNTER — Encounter: Payer: Self-pay | Admitting: Cardiology

## 2012-03-21 ENCOUNTER — Other Ambulatory Visit: Payer: Self-pay | Admitting: Cardiology

## 2012-03-21 ENCOUNTER — Ambulatory Visit (INDEPENDENT_AMBULATORY_CARE_PROVIDER_SITE_OTHER): Payer: BC Managed Care – PPO | Admitting: Cardiology

## 2012-03-21 VITALS — BP 143/93 | HR 82 | Ht 69.0 in | Wt 219.0 lb

## 2012-03-21 DIAGNOSIS — R072 Precordial pain: Secondary | ICD-10-CM

## 2012-03-21 DIAGNOSIS — I10 Essential (primary) hypertension: Secondary | ICD-10-CM

## 2012-03-21 NOTE — Assessment & Plan Note (Signed)
Symptoms reviewed. ECG shows nonspecific T-wave changes. She has a history of hypertension, also some family history of cardiovascular disease on her mother's side. No active tobacco use. She had had an equivocal exercise echocardiogram in the past without contrast. Our plan is to pursue an exercise echocardiogram with [facility]. If clearly abnormal, then invasive evaluation could be considered. We will inform her of the results.

## 2012-03-21 NOTE — Assessment & Plan Note (Signed)
Followed by Dr. Lodema Hong, on medical therapy.

## 2012-03-21 NOTE — Patient Instructions (Signed)
   Exercise Echo with [facility]  Office will call with results

## 2012-03-21 NOTE — Progress Notes (Signed)
Clinical Summary Allison Hart is a 51 y.o.female referred back to the office by Dr. Lodema Hong for cardiology evaluation. I saw her back in 2011 She had undergone previous exercise echocardiogram in 9/11 that demonstrated no diagnostic ST segment changes, however suboptimal echocardiographic images with question of inferior hypokinesis. We discussed the results at that time and elected to pursue observation only.  She saw Dr. Lodema Hong recently, and mentioned some intermittent, sharp chest pains, not clearly with exertional. She is concerned about family history of CAD. Personal risk factors include hypertension.  Her ECG today shows normal sinus rhythm with nonspecific T-wave changes.  She has not had any followup ischemic evaluation since 2011.   No Known Allergies  Current Outpatient Prescriptions  Medication Sig Dispense Refill  . amLODipine (NORVASC) 5 MG tablet TAKE ONE TABLET BY MOUTH EVERY DAY  90 tablet  0  . betamethasone valerate (VALISONE) 0.1 % cream Apply topically 2 (two) times daily. Apply twice daily for a week, then as needed to rashes       . loratadine (CLARITIN) 10 MG tablet Take 1 tablet (10 mg total) by mouth daily.  30 tablet  4  . omeprazole (PRILOSEC) 20 MG capsule TAKE ONE CAPSULE BY MOUTH EVERY DAY AS NEEDED  90 capsule  1  . terbinafine (LAMISIL) 250 MG tablet 1 tab daily  30 tablet  3  . triamterene-hydrochlorothiazide (MAXZIDE-25) 37.5-25 MG per tablet TAKE ONE TABLET BY MOUTH EVERY DAY  90 tablet  0    Past Medical History  Diagnosis Date  . GERD (gastroesophageal reflux disease)   . Essential hypertension, benign   . Obesity     Past Surgical History  Procedure Date  . Umbilical hernia repair infancy   . Embolism femoral atery x2 2007  . Left inguinal hernia repair and endometriosis 2007    No definitive records   . Left parotid bx benign 2005    Family History  Problem Relation Age of Onset  . Hypertension Mother   . Hyperlipidemia Mother   .  Thyroid disease Mother   . Coronary artery disease Mother   . Irregular heart beat Mother   . Fibroids Sister   . Hypertension Sister   . Ulcerative colitis Son     Social History [name] reports that she has quit smoking. Her smoking use included Cigarettes. She has never used smokeless tobacco. [name] reports that she drinks alcohol.  Review of Systems No palpitations, dizziness, syncope. 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 No claudication. Some left hip pain. Stable appetite. Otherwise negative.  Physical Examination Filed Vitals:   03/21/12 1019  BP: 143/93  Pulse: 82   Well-nourished, tall woman in no acute distress. HEENT: Conjunctiva and lids normal, oropharynx clear. Neck: Supple, no elevated JVP or carotid bruits, no thyromegaly. Lungs: Clear to auscultation, nonlabored breathing at rest. Cardiac: Regular rate and rhythm, no S3 or significant systolic murmur, no pericardial rub. Abdomen: Soft, nontender, bowel sounds present, no guarding or rebound. Extremities: No pitting edema, distal pulses 2+. Skin: Warm and dry. Musculoskeletal: No kyphosis. Neuropsychiatric: Alert and oriented x3, affect grossly appropriate.    Problem List and Plan   Chest pain, atypical Symptoms reviewed. ECG shows nonspecific T-wave changes. She has a history of hypertension, also some family history of cardiovascular disease on her mother's side. No active tobacco use. She had had an equivocal exercise echocardiogram in the past without contrast. Our plan is to pursue an exercise echocardiogram with [facility]. If  clearly abnormal, then invasive evaluation could be considered. We will inform her of the results.  HYPERTENSION Followed by Dr. Lodema Hong, on medical therapy.    Jonelle Sidle, M.D., F.A.C.C.

## 2012-03-26 ENCOUNTER — Telehealth: Payer: Self-pay

## 2012-03-26 NOTE — Telephone Encounter (Signed)
Exercise Echo with [facility] set for 04-01-2012 [facility] Checking percert

## 2012-03-29 NOTE — Telephone Encounter (Signed)
Pt has [employer] [place].  Per Diamond Nickel, [phone], no precert required.

## 2012-04-01 DIAGNOSIS — R079 Chest pain, unspecified: Secondary | ICD-10-CM

## 2012-04-02 ENCOUNTER — Encounter: Payer: Self-pay | Admitting: Family Medicine

## 2012-04-02 ENCOUNTER — Ambulatory Visit (INDEPENDENT_AMBULATORY_CARE_PROVIDER_SITE_OTHER): Payer: BC Managed Care – PPO | Admitting: Family Medicine

## 2012-04-02 VITALS — BP 142/90 | HR 83 | Resp 18 | Ht 70.0 in | Wt 221.0 lb

## 2012-04-02 DIAGNOSIS — K219 Gastro-esophageal reflux disease without esophagitis: Secondary | ICD-10-CM

## 2012-04-02 DIAGNOSIS — R599 Enlarged lymph nodes, unspecified: Secondary | ICD-10-CM

## 2012-04-02 DIAGNOSIS — I10 Essential (primary) hypertension: Secondary | ICD-10-CM

## 2012-04-02 DIAGNOSIS — N760 Acute vaginitis: Secondary | ICD-10-CM

## 2012-04-02 DIAGNOSIS — B009 Herpesviral infection, unspecified: Secondary | ICD-10-CM

## 2012-04-02 DIAGNOSIS — R590 Localized enlarged lymph nodes: Secondary | ICD-10-CM

## 2012-04-02 DIAGNOSIS — N766 Ulceration of vulva: Secondary | ICD-10-CM

## 2012-04-02 DIAGNOSIS — E669 Obesity, unspecified: Secondary | ICD-10-CM

## 2012-04-02 MED ORDER — HYDROXYZINE HCL 10 MG PO TABS: ORAL_TABLET | ORAL | Status: DC

## 2012-04-02 MED ORDER — DOXYCYCLINE HYCLATE 50 MG PO CAPS: 50.0000 mg | ORAL_CAPSULE | Freq: Two times a day (BID) | ORAL | Status: AC

## 2012-04-02 MED ORDER — FLUCONAZOLE 150 MG PO TABS: ORAL_TABLET | ORAL | Status: AC

## 2012-04-02 MED ORDER — DOXYCYCLINE HYCLATE 50 MG PO CAPS: 50.0000 mg | ORAL_CAPSULE | Freq: Two times a day (BID) | ORAL | Status: DC

## 2012-04-02 MED ORDER — CEFTRIAXONE SODIUM 1 G IJ SOLR
500.0000 mg | Freq: Once | INTRAMUSCULAR | Status: AC
  Administered 2012-04-02: 500 mg via INTRAMUSCULAR

## 2012-04-02 NOTE — Progress Notes (Signed)
  Subjective:    Patient ID: Allison Hart, female    DOB: [birth year], 51 y.o.   MRN: [id number]  HPI 10 day h/o painful blisters and stinging and itching, no signifcant d/c no fever or chills. Painful swollen glands noticed on right side of groin x 1 week. Has painful urination due to sores, denies frequency or flank pain, no fever or chills. Pt is understandably stressed and concerned about this, she has had issues with her spouse in the recent past, wants to be checked for everything  Review of Systems .See HPI Denies recent fever or chills. Denies sinus pressure, nasal congestion, ear pain or sore throat. Denies chest congestion, productive cough or wheezing. Denies chest pains, palpitations and leg swelling Denies abdominal pain, nausea, vomiting,diarrhea or constipation.   . Denies joint pain, swelling and limitation in mobility. Denies headaches, seizures, numbness, or tingling.         Objective:   Physical Exam  Patient alert and oriented and in no cardiopulmonary distress.Anxious and distressed  HEENT: No facial asymmetry, EOMI, no sinus tenderness,  oropharynx pink and moist.  Neck supple no adenopathy.  Chest: Clear to auscultation bilaterally.  CVS: S1, S2 no murmurs, no S3.  ABD: Soft non tender. Bowel sounds normal. Genital: labia majora and minora traumatized from scratching with breakdown, and erythema Vaginal canal erythematous and tender on exam, cervical and adnexal tenderness no cervical or adnexal tenderness Ext: No edema  MS: Adequate ROM spine, shoulders, hips and knees.  Skin: Intact, no ulcerations or rash noted.  Psych: Good eye contact, normal affect. Memory intact anxious   CNS: CN 2-12 intact, power, tone and sensation normal throughout.       Assessment & Plan:

## 2012-04-02 NOTE — Patient Instructions (Addendum)
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

## 2012-04-03 ENCOUNTER — Telehealth: Payer: Self-pay | Admitting: *Deleted

## 2012-04-03 DIAGNOSIS — B009 Herpesviral infection, unspecified: Secondary | ICD-10-CM | POA: Insufficient documentation

## 2012-04-03 LAB — HIV ANTIBODY (ROUTINE TESTING W REFLEX): HIV: NONREACTIVE

## 2012-04-03 MED ORDER — ACYCLOVIR 400 MG PO TABS: 400.0000 mg | ORAL_TABLET | Freq: Every day | ORAL | Status: DC

## 2012-04-03 NOTE — Telephone Encounter (Signed)
Patient informed. 

## 2012-04-03 NOTE — Telephone Encounter (Signed)
Message copied by Eustace Moore on Wed Apr 03, 2012  1:55 PM ------      Message from: Jonelle Sidle      Created: Tue Apr 02, 2012  3:58 PM       Study shows no clear evidence of ischemia and overall normal LV function. Would continue observation for now and follow up with primary care physician. Can see her back if symptoms persist or progress.

## 2012-04-04 LAB — GC/CHLAMYDIA PROBE AMP, GENITAL: Chlamydia, DNA Probe: NEGATIVE

## 2012-04-04 LAB — WET PREP BY MOLECULAR PROBE
Candida species: NEGATIVE
Gardnerella vaginalis: NEGATIVE

## 2012-05-01 DIAGNOSIS — R590 Localized enlarged lymph nodes: Secondary | ICD-10-CM | POA: Insufficient documentation

## 2012-05-01 DIAGNOSIS — B009 Herpesviral infection, unspecified: Secondary | ICD-10-CM | POA: Insufficient documentation

## 2012-05-01 NOTE — Assessment & Plan Note (Signed)
Acute infection/infalammain, doxycycline prescribed

## 2012-05-01 NOTE — Assessment & Plan Note (Signed)
Deteriorated. Patient re-educated about  the importance of commitment to a  minimum of 150 minutes of exercise per week. The importance of healthy food choices with portion control discussed. Encouraged to start a food diary, count calories and to consider  joining a support group. Sample diet sheets offered. Goals set by the patient for the next several months.    

## 2012-05-01 NOTE — Assessment & Plan Note (Signed)
Acyclovir course prescribed for first episode

## 2012-05-01 NOTE — Assessment & Plan Note (Signed)
Controlled, no change in medication  

## 2012-05-01 NOTE — Assessment & Plan Note (Signed)
Controlled on daily medication

## 2012-05-14 ENCOUNTER — Other Ambulatory Visit: Payer: Self-pay | Admitting: Family Medicine

## 2012-07-02 ENCOUNTER — Ambulatory Visit (INDEPENDENT_AMBULATORY_CARE_PROVIDER_SITE_OTHER): Payer: BC Managed Care – PPO | Admitting: Family Medicine

## 2012-07-02 ENCOUNTER — Encounter: Payer: Self-pay | Admitting: Family Medicine

## 2012-07-02 ENCOUNTER — Other Ambulatory Visit: Payer: Self-pay | Admitting: Family Medicine

## 2012-07-02 ENCOUNTER — Other Ambulatory Visit (HOSPITAL_COMMUNITY)
Admission: RE | Admit: 2012-07-02 | Discharge: 2012-07-02 | Disposition: A | Discharge status: Home / Self Care | Payer: BC Managed Care – PPO | Source: Ambulatory Visit | Attending: Family Medicine | Admitting: Family Medicine

## 2012-07-02 VITALS — BP 160/94 | HR 78 | Resp 15 | Ht 70.0 in | Wt 225.1 lb

## 2012-07-02 DIAGNOSIS — E785 Hyperlipidemia, unspecified: Secondary | ICD-10-CM

## 2012-07-02 DIAGNOSIS — E669 Obesity, unspecified: Secondary | ICD-10-CM

## 2012-07-02 DIAGNOSIS — Z23 Encounter for immunization: Secondary | ICD-10-CM

## 2012-07-02 DIAGNOSIS — Z124 Encounter for screening for malignant neoplasm of cervix: Secondary | ICD-10-CM

## 2012-07-02 DIAGNOSIS — B009 Herpesviral infection, unspecified: Secondary | ICD-10-CM

## 2012-07-02 DIAGNOSIS — Z01419 Encounter for gynecological examination (general) (routine) without abnormal findings: Secondary | ICD-10-CM | POA: Insufficient documentation

## 2012-07-02 DIAGNOSIS — Z1211 Encounter for screening for malignant neoplasm of colon: Secondary | ICD-10-CM

## 2012-07-02 DIAGNOSIS — Z0000 Encounter for general adult medical examination without abnormal findings: Secondary | ICD-10-CM

## 2012-07-02 DIAGNOSIS — J309 Allergic rhinitis, unspecified: Secondary | ICD-10-CM

## 2012-07-02 DIAGNOSIS — I10 Essential (primary) hypertension: Secondary | ICD-10-CM

## 2012-07-02 DIAGNOSIS — Z139 Encounter for screening, unspecified: Secondary | ICD-10-CM

## 2012-07-02 MED ORDER — TRIAMTERENE-HCTZ 37.5-25 MG PO TABS: ORAL_TABLET | ORAL | Status: DC

## 2012-07-02 MED ORDER — ACYCLOVIR 400 MG PO TABS: 400.0000 mg | ORAL_TABLET | Freq: Two times a day (BID) | ORAL | Status: AC

## 2012-07-02 NOTE — Progress Notes (Signed)
  Subjective:    Patient ID: Allison Hart, female    DOB: 18-Jul-1961, 51 y.o.   MRN: [id number]  HPI The PT is here for follow annual exam and re-evaluation of chronic medical conditions, medication management and review of any available recent lab and radiology data.  Preventive health is updated, specifically  Cancer screening and Immunization.   Questions or concerns regarding consultations or procedures which the PT has had in the interim are  addressed. The PT denies any adverse reactions to current medications since the last visit.  There are no new concerns.  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 Denies sinus pressure, nasal congestion, ear pain or sore throat. Denies chest congestion, productive cough or wheezing. Denies chest pains, palpitations and leg swelling Denies abdominal pain, nausea, vomiting,diarrhea or constipation.   Denies dysuria, frequency, hesitancy or incontinence. Denies joint pain, swelling and limitation in mobility. Denies headaches, seizures, numbness, or tingling. Denies depression, anxiety or insomnia. Denies skin break down or rash.        Objective:   Physical Exam Pleasant well nourished female, alert and oriented x 3, in no cardio-pulmonary distress. Afebrile. HEENT No facial trauma or asymetry. Sinuses non tender.  EOMI, PERTL, fundoscopic exam is normal, no hemorhage or exudate.  External ears normal, tympanic membranes clear. Oropharynx moist, no exudate, good dentition. Neck: supple, no adenopathy,JVD or thyromegaly.No bruits.  Chest: Clear to ascultation bilaterally.No crackles or wheezes. Non tender to palpation  Breast: No asymetry,no masses. No nipple discharge or inversion. No axillary or supraclavicular adenopathy  Cardiovascular system; Heart sounds normal,  S1 and  S2 ,no  S3.  No murmur, or thrill. Apical beat not displaced Peripheral pulses normal.  Abdomen: Soft, non tender, no organomegaly or masses. No bruits. Bowel sounds normal. No guarding, tenderness or rebound.  Rectal:  No mass. Guaiac negative stool.  GU: External genitalia normal. No lesions. Vaginal canal normal.physiologic  discharge. Uterus normal size, no adnexal masses, no cervical motion or adnexal tenderness.  Musculoskeletal exam: Full ROM of spine, hips , shoulders and knees. No deformity ,swelling or crepitus noted. No muscle wasting or atrophy.   Neurologic: Cranial nerves 2 to 12 intact. Power, tone ,sensation and reflexes normal throughout. No disturbance in gait. No tremor.  Skin: Intact, no ulceration, erythema , scaling or rash noted. Pigmentation normal throughout  Psych; Normal mood and affect. Judgement and concentration normal       Assessment & Plan:

## 2012-07-02 NOTE — Patient Instructions (Addendum)
F/u end January  Blood pressure is too high, you need to reduce salt, exercise for 30 minutes every day, and eat mainly fresh or frozen fruit and vegetable  Cut back on fried and fatty foods  Flu vaccine today  Colonoscopy was in 2005, I will review report, likely do not need one before 2015  Fasting lipid and chem 7 in January , before visit  Mammogram at [facility] when due, call [place] and see when due then call radiology at [facility] and schedule please   You need to lose weight  Use acylovir as we discussed if you start having a flare only one 3 times daily for 5 days , sent in with different directions   Dose increase in triamterene to one and a half tablets once daily, amlodipine remains as before one daily

## 2012-07-14 DIAGNOSIS — Z0000 Encounter for general adult medical examination without abnormal findings: Secondary | ICD-10-CM | POA: Insufficient documentation

## 2012-07-14 NOTE — Assessment & Plan Note (Signed)
Pelvic and breast exam performed and documented in chart. Special counseling re safe sex, need for lifestyle change to improve health, blood pressure is uncontrolled and weight has been gained

## 2012-07-14 NOTE — Assessment & Plan Note (Signed)
Deteriorated. Patient re-educated about  the importance of commitment to a  minimum of 150 minutes of exercise per week. The importance of healthy food choices with portion control discussed. Encouraged to start a food diary, count calories and to consider  joining a support group. Sample diet sheets offered. Goals set by the patient for the next several months.    

## 2012-07-14 NOTE — Assessment & Plan Note (Signed)
Increased symptoms with season change. The importance of daily use of meds is stressed to control symptoms

## 2012-07-14 NOTE — Assessment & Plan Note (Signed)
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

## 2012-07-14 NOTE — Assessment & Plan Note (Signed)
Uncontrolled. Dose increase in meds DASH diet and commitment to daily physical activity for a minimum of 30 minutes discussed and encouraged, as a part of hypertension management. The importance of attaining a healthy weight is also discussed.  

## 2012-07-16 ENCOUNTER — Other Ambulatory Visit: Payer: Self-pay | Admitting: Family Medicine

## 2012-07-16 ENCOUNTER — Encounter (INDEPENDENT_AMBULATORY_CARE_PROVIDER_SITE_OTHER): Payer: Self-pay | Admitting: *Deleted

## 2012-07-16 ENCOUNTER — Telehealth: Payer: Self-pay | Admitting: Family Medicine

## 2012-07-16 DIAGNOSIS — Z1211 Encounter for screening for malignant neoplasm of colon: Secondary | ICD-10-CM

## 2012-07-16 NOTE — Telephone Encounter (Signed)
Pt was referred to dr. Karilyn Cota and is aware

## 2012-07-16 NOTE — Telephone Encounter (Signed)
Please let pt know that I have not been able to obtain ay record of a previous colonoscopy at [facility] as she had said, so I am referring her to Dr Karilyn Cota for a screening colonoscopy which starts at age 50 Pls send referral

## 2012-08-22 ENCOUNTER — Other Ambulatory Visit: Payer: Self-pay | Admitting: Family Medicine

## 2012-09-07 ENCOUNTER — Other Ambulatory Visit: Payer: Self-pay | Admitting: Family Medicine

## 2012-09-09 ENCOUNTER — Ambulatory Visit (HOSPITAL_COMMUNITY)
Admission: RE | Admit: 2012-09-09 | Discharge: 2012-09-09 | Disposition: A | Discharge status: Home / Self Care | Payer: BC Managed Care – PPO | Source: Ambulatory Visit | Attending: Family Medicine | Admitting: Family Medicine

## 2012-09-09 DIAGNOSIS — Z139 Encounter for screening, unspecified: Secondary | ICD-10-CM

## 2012-09-09 DIAGNOSIS — Z1231 Encounter for screening mammogram for malignant neoplasm of breast: Secondary | ICD-10-CM | POA: Insufficient documentation

## 2012-09-11 ENCOUNTER — Other Ambulatory Visit: Payer: Self-pay

## 2012-09-11 MED ORDER — TRIAMTERENE-HCTZ 37.5-25 MG PO TABS: 1.5000 | ORAL_TABLET | Freq: Every day | ORAL | Status: DC

## 2012-09-12 ENCOUNTER — Telehealth (INDEPENDENT_AMBULATORY_CARE_PROVIDER_SITE_OTHER): Payer: Self-pay | Admitting: *Deleted

## 2012-09-12 ENCOUNTER — Other Ambulatory Visit (INDEPENDENT_AMBULATORY_CARE_PROVIDER_SITE_OTHER): Payer: Self-pay | Admitting: *Deleted

## 2012-09-12 ENCOUNTER — Encounter (INDEPENDENT_AMBULATORY_CARE_PROVIDER_SITE_OTHER): Payer: Self-pay | Admitting: *Deleted

## 2012-09-12 DIAGNOSIS — Z1211 Encounter for screening for malignant neoplasm of colon: Secondary | ICD-10-CM

## 2012-09-12 MED ORDER — PEG-KCL-NACL-NASULF-NA ASC-C 100 G PO SOLR: 1.0000 | Freq: Once | ORAL | Status: DC

## 2012-09-12 NOTE — Telephone Encounter (Signed)
Patient needs movi prep 

## 2012-09-16 ENCOUNTER — Telehealth: Payer: Self-pay | Admitting: Family Medicine

## 2012-09-16 NOTE — Telephone Encounter (Signed)
Called pharmacy and they have the correct one on hold. Will fill for pt

## 2012-10-17 ENCOUNTER — Telehealth (INDEPENDENT_AMBULATORY_CARE_PROVIDER_SITE_OTHER): Payer: Self-pay | Admitting: *Deleted

## 2012-10-17 NOTE — Telephone Encounter (Signed)
  Procedure: tcs  Reason/Indication:  screening  Has patient had this procedure before?  yes  If so, when, by whom and where?  10 yrs or more ago  Is there a family history of colon cancer?  no  Who?  What age when diagnosed?    Is patient diabetic?   no      Does patient have prosthetic heart valve?  no  Do you have a pacemaker?  no  Has patient had joint replacement within last 12 months?  no  Is patient on Coumadin, Plavix and/or Aspirin? no  Medications: see EPIC  Allergies: nkda  Medication Adjustment:   Procedure date & time: 11/13/12 at 930

## 2012-10-17 NOTE — Telephone Encounter (Signed)
agree

## 2012-11-06 ENCOUNTER — Ambulatory Visit: Payer: BC Managed Care – PPO | Admitting: Family Medicine

## 2012-11-11 ENCOUNTER — Encounter (HOSPITAL_COMMUNITY): Payer: Self-pay | Admitting: Pharmacy Technician

## 2012-11-13 ENCOUNTER — Ambulatory Visit (HOSPITAL_COMMUNITY)
Admission: RE | Admit: 2012-11-13 | Discharge: 2012-11-13 | Disposition: A | Discharge status: Home / Self Care | Payer: BC Managed Care – PPO | Source: Ambulatory Visit | Attending: Internal Medicine | Admitting: Internal Medicine

## 2012-11-13 ENCOUNTER — Encounter (HOSPITAL_COMMUNITY)
Admission: RE | Disposition: A | Discharge status: Home / Self Care | Payer: Self-pay | Source: Ambulatory Visit | Attending: Internal Medicine

## 2012-11-13 ENCOUNTER — Encounter (HOSPITAL_COMMUNITY): Payer: Self-pay | Admitting: *Deleted

## 2012-11-13 DIAGNOSIS — Z1211 Encounter for screening for malignant neoplasm of colon: Secondary | ICD-10-CM

## 2012-11-13 DIAGNOSIS — K644 Residual hemorrhoidal skin tags: Secondary | ICD-10-CM

## 2012-11-13 DIAGNOSIS — K5730 Diverticulosis of large intestine without perforation or abscess without bleeding: Secondary | ICD-10-CM

## 2012-11-13 DIAGNOSIS — I10 Essential (primary) hypertension: Secondary | ICD-10-CM | POA: Insufficient documentation

## 2012-11-13 HISTORY — DX: Cardiac murmur, unspecified: R01.1

## 2012-11-13 HISTORY — PX: COLONOSCOPY: SHX5424

## 2012-11-13 SURGERY — COLONOSCOPY
Anesthesia: Moderate Sedation

## 2012-11-13 MED ORDER — SODIUM CHLORIDE 0.45 % IV SOLN: INTRAVENOUS | Status: DC

## 2012-11-13 MED ORDER — MIDAZOLAM HCL 5 MG/5ML IJ SOLN
INTRAMUSCULAR | Status: AC
  Filled 2012-11-13: qty 10

## 2012-11-13 MED ORDER — STERILE WATER FOR IRRIGATION IR SOLN
Status: DC | PRN
  Administered 2012-11-13: 10:00:00

## 2012-11-13 MED ORDER — MEPERIDINE HCL 50 MG/ML IJ SOLN
INTRAMUSCULAR | Status: AC
  Filled 2012-11-13: qty 1

## 2012-11-13 MED ORDER — MEPERIDINE HCL 50 MG/ML IJ SOLN
INTRAMUSCULAR | Status: DC | PRN
  Administered 2012-11-13 (×2): 25 mg via INTRAVENOUS

## 2012-11-13 MED ORDER — MIDAZOLAM HCL 5 MG/5ML IJ SOLN
INTRAMUSCULAR | Status: DC | PRN
  Administered 2012-11-13 (×3): 2 mg via INTRAVENOUS

## 2012-11-13 NOTE — H&P (Addendum)
Allison Hart is an 52 y.o. female.   Chief Complaint: Patient is here for colonoscopy. HPI: Patient is 52 year old African female who is in for screening colonoscopy. She denies abdominal pain change in her bowel habits rectal bleeding. Family history is negative for colorectal carcinoma. Her last colonoscopy was 10 years ago.  Past Medical History  Diagnosis Date  . GERD (gastroesophageal reflux disease)   . Essential hypertension, benign   . Obesity   . Murmur     Past Surgical History  Procedure Date  . Umbilical hernia repair infancy   . Embolism femoral atery x2 2007  . Left inguinal hernia repair and endometriosis 2007    No definitive records   . Left parotid bx benign 2005  . Embolization of uterine artery x 2    patient does not recall that she had surgery for femoral artery embolism  Family History  Problem Relation Age of Onset  . Hypertension Mother   . Hyperlipidemia Mother   . Thyroid disease Mother   . Coronary artery disease Mother   . Irregular heart beat Mother   . Fibroids Sister   . Hypertension Sister   . Ulcerative colitis Son   . Colon cancer Neg Hx    Social History:  reports that she has quit smoking. Her smoking use included Cigarettes. She has a 1.25 pack-year smoking history. She has never used smokeless tobacco. She reports that she drinks alcohol. She reports that she does not use illicit drugs.  Allergies: No Known Allergies  Medications Prior to Admission  Medication Sig Dispense Refill  . acyclovir (ZOVIRAX) 400 MG tablet Take 400 mg by mouth 2 (two) times daily as needed. Outbreak.      Marland Kitchen amLODipine (NORVASC) 5 MG tablet TAKE ONE TABLET BY MOUTH EVERY DAY  90 tablet  0  . Coenzyme Q10 (CO Q 10) 100 MG CAPS Take 1 capsule by mouth daily.      Marland Kitchen loratadine (CLARITIN) 10 MG tablet TAKE ONE TABLET BY MOUTH EVERY DAY  30 tablet  3  . omeprazole (PRILOSEC) 20 MG capsule       . peg 3350 powder (MOVIPREP) 100 G SOLR Take 1 kit (100 g total)  by mouth once.  1 kit  0  . triamterene-hydrochlorothiazide (MAXZIDE-25) 37.5-25 MG per tablet Take 1.5 each (1.5 tablets total) by mouth daily.  135 tablet  1    No results found for this or any previous visit (from the past 48 hour(s)). No results found.  ROS  Blood pressure 129/84, pulse 76, temperature 97.9 F (36.6 C), temperature source Oral, resp. rate 16, height 5\' 9"  (1.753 m), weight 220 lb (99.791 kg), last menstrual period 10/23/2012, SpO2 96.00%. Physical Exam  Constitutional: She appears well-developed and well-nourished.  HENT:  Mouth/Throat: Oropharynx is clear and moist.  Eyes: Conjunctivae normal are normal. No scleral icterus.  Neck: No thyromegaly present.  Cardiovascular: Normal rate, regular rhythm and normal heart sounds.   No murmur heard. Respiratory: Effort normal and breath sounds normal.  GI: Soft. She exhibits no distension and no mass. There is no tenderness.  Musculoskeletal: She exhibits no edema.  Lymphadenopathy:    She has no cervical adenopathy.  Neurological: She is alert.  Skin: Skin is warm and dry.     Assessment/Plan Average risk screening colonoscopy.  REHMAN,NAJEEB U 11/13/2012, 10:00 AM

## 2012-11-13 NOTE — Op Note (Signed)
COLONOSCOPY PROCEDURE REPORT  PATIENT:  Allison Hart  MR#:  [id number] Birthdate:  1961-07-20, 52 y.o., female Endoscopist:  Dr. Malissa Hippo, MD Referred By:  Dr. Syliva Overman, MD  Procedure Date: 11/13/2012  Procedure:   Colonoscopy  Indications:  Patient is 52 year old African female was undergoing average risk screening colonoscopy.  Informed Consent:  The procedure and risks were reviewed with the patient and informed consent was obtained.  Medications:  Demerol 50 mg IV Versed 6 mg IV  Description of procedure:  After a digital rectal exam was performed, that colonoscope was advanced from the anus through the rectum and colon to the area of the cecum, ileocecal valve and appendiceal orifice. The cecum was deeply intubated. These structures were well-seen and photographed for the record. From the level of the cecum and ileocecal valve, the scope was slowly and cautiously withdrawn. The mucosal surfaces were carefully surveyed utilizing scope tip to flexion to facilitate fold flattening as needed. The scope was pulled down into the rectum where a thorough exam including retroflexion was performed.  Findings:   Prep excellent. Scattered diverticula at sigmoid, descending and transverse colon. Normal rectal mucosa. Small hemorrhoids below the dentate line..  Therapeutic/Diagnostic Maneuvers Performed:  None  Complications:  None  Cecal Withdrawal Time:  11 minutes  Impression:  Examination performed to cecum. Scattered diverticula at transverse, descending and sigmoid colon. No evidence of colonic polyps. Small external hemorrhoids.   Recommendations:  Standard instructions given. Next screening exam in 10 years.  Mahogany Torrance U  11/13/2012 10:36 AM  CC: Dr. Syliva Overman, MD & Dr. Bonnetta Barry ref. provider found

## 2012-11-14 ENCOUNTER — Encounter (HOSPITAL_COMMUNITY): Payer: Self-pay | Admitting: Internal Medicine

## 2012-11-18 ENCOUNTER — Other Ambulatory Visit: Payer: Self-pay | Admitting: Family Medicine

## 2012-11-19 LAB — LIPID PANEL
Cholesterol: 197 mg/dL (ref 0–200)
HDL: 43 mg/dL (ref >39)
Total CHOL/HDL Ratio: 4.6 Ratio
Triglycerides: 55 mg/dL (ref <150)
VLDL: 11 mg/dL (ref 0–40)

## 2012-11-19 LAB — BASIC METABOLIC PANEL
Calcium: 9.3 mg/dL (ref 8.4–10.5)
Potassium: 4.1 mEq/L (ref 3.5–5.3)
Sodium: 141 mEq/L (ref 135–145)

## 2012-11-25 ENCOUNTER — Other Ambulatory Visit: Payer: Self-pay | Admitting: Family Medicine

## 2012-12-04 ENCOUNTER — Ambulatory Visit (INDEPENDENT_AMBULATORY_CARE_PROVIDER_SITE_OTHER): Payer: BC Managed Care – PPO | Admitting: Family Medicine

## 2012-12-04 ENCOUNTER — Encounter: Payer: Self-pay | Admitting: Family Medicine

## 2012-12-04 VITALS — BP 140/90 | HR 92 | Resp 16 | Ht 70.0 in | Wt 222.8 lb

## 2012-12-04 DIAGNOSIS — K219 Gastro-esophageal reflux disease without esophagitis: Secondary | ICD-10-CM

## 2012-12-04 DIAGNOSIS — I889 Nonspecific lymphadenitis, unspecified: Secondary | ICD-10-CM | POA: Insufficient documentation

## 2012-12-04 DIAGNOSIS — R5381 Other malaise: Secondary | ICD-10-CM

## 2012-12-04 DIAGNOSIS — E669 Obesity, unspecified: Secondary | ICD-10-CM

## 2012-12-04 DIAGNOSIS — I10 Essential (primary) hypertension: Secondary | ICD-10-CM

## 2012-12-04 DIAGNOSIS — B009 Herpesviral infection, unspecified: Secondary | ICD-10-CM

## 2012-12-04 DIAGNOSIS — R5383 Other fatigue: Secondary | ICD-10-CM

## 2012-12-04 DIAGNOSIS — E785 Hyperlipidemia, unspecified: Secondary | ICD-10-CM

## 2012-12-04 DIAGNOSIS — E8881 Metabolic syndrome: Secondary | ICD-10-CM

## 2012-12-04 DIAGNOSIS — R7301 Impaired fasting glucose: Secondary | ICD-10-CM

## 2012-12-04 MED ORDER — ACYCLOVIR 400 MG PO TABS: 400.0000 mg | ORAL_TABLET | Freq: Three times a day (TID) | ORAL | Status: AC

## 2012-12-04 MED ORDER — TRIAMTERENE-HCTZ 75-50 MG PO TABS: 1.0000 | ORAL_TABLET | Freq: Every day | ORAL | Status: DC

## 2012-12-04 MED ORDER — FLUCONAZOLE 150 MG PO TABS: ORAL_TABLET | ORAL | Status: DC

## 2012-12-04 MED ORDER — DOXYCYCLINE HYCLATE 50 MG PO CAPS: 50.0000 mg | ORAL_CAPSULE | Freq: Two times a day (BID) | ORAL | Status: AC

## 2012-12-04 MED ORDER — ACYCLOVIR 400 MG PO TABS: 400.0000 mg | ORAL_TABLET | Freq: Two times a day (BID) | ORAL | Status: DC

## 2012-12-04 NOTE — Assessment & Plan Note (Addendum)
Left tender inguinal adenitis, antibiotic prescribed

## 2012-12-04 NOTE — Patient Instructions (Addendum)
F/u in 5.5 month  Fasting lipid, cmp , hBA1C , TSh in 5 month   Meds as discussed , increase dose of blood pressure medication, oK to take TWO 25 mg maxzide till done , new is maxzide 50 mg one daily  Keep the acyclovir script for 3 times daily only for outbreak, taker other twice daily as prescribed to reduce risk of out break

## 2012-12-09 NOTE — Assessment & Plan Note (Signed)
Controlled, no change in medication  

## 2012-12-09 NOTE — Assessment & Plan Note (Signed)
Elevated LDL persists. Hyperlipidemia:Low fat diet discussed and encouraged.  No meds at this time

## 2012-12-09 NOTE — Assessment & Plan Note (Signed)
Uncontrolled, dose increase in maxzide DASH diet and commitment to daily physical activity for a minimum of 30 minutes discussed and encouraged, as a part of hypertension management. The importance of attaining a healthy weight is also discussed.  

## 2012-12-09 NOTE — Progress Notes (Signed)
  Subjective:    Patient ID: Allison Hart, female    DOB: 1961-08-26, 52 y.o.   MRN: [id number]  HPI The PT is here for follow up and re-evaluation of chronic medical conditions, medication management and review of any available recent lab and radiology data.  Preventive health is updated, specifically  Cancer screening and Immunization.    The PT denies any adverse reactions to current medications since the last visit.  1 week h/o tender swollen left groin gland. Denies vaginal d/c , dyspareunia and current outbreak of herpes. She does however c/o intermittent tingling and discomfort in the vulva, suppressive therapy indicated in my opinion Increased stress associated with moving in with her ill father, but good support from spouse, and she feels overall the situation is better for the whole family      Review of Systems See HPI Denies recent fever or chills. Denies sinus pressure, nasal congestion, ear pain or sore throat. Denies chest congestion, productive cough or wheezing. Denies chest pains, palpitations and leg swelling Denies abdominal pain, nausea, vomiting,diarrhea or constipation.   Denies dysuria, frequency, hesitancy or incontinence. Denies joint pain, swelling and limitation in mobility. Denies headaches, seizures, numbness, or tingling. Denies depression, anxiety or insomnia. Denies skin break down or rash.        Objective:   Physical Exam  Patient alert and oriented and in no cardiopulmonary distress.  HEENT: No facial asymmetry, EOMI, no sinus tenderness,  oropharynx pink and moist.  Neck supple no adenopathy.  Chest: Clear to auscultation bilaterally.  CVS: S1, S2 no murmurs, no S3.  ABD: Soft non tender. Bowel sounds normal.  Pelvic: not done, not indicated based on history  Ext: No edema  MS: Adequate ROM spine, shoulders, hips and knees.  Skin: Intact, left inguinal adenitis, tender , associated with ingrown hair follicle. No purulent  drainage Psych: Good eye contact, normal affect. Memory intact not anxious or depressed appearing.  CNS: CN 2-12 intact, power, tone and sensation normal throughout.       Assessment & Plan:

## 2012-12-09 NOTE — Assessment & Plan Note (Signed)
Deteriorated. Patient re-educated about  the importance of commitment to a  minimum of 150 minutes of exercise per week. The importance of healthy food choices with portion control discussed. Encouraged to start a food diary, count calories and to consider  joining a support group. Sample diet sheets offered. Goals set by the patient for the next several months.    

## 2012-12-09 NOTE — Assessment & Plan Note (Signed)
No acute flare currently, however c/o chronic discomfort, will be maintained on suppressive treatment. She is also provided with a script to keep in the event of an outbreak/flare

## 2013-04-06 ENCOUNTER — Other Ambulatory Visit: Payer: Self-pay | Admitting: Family Medicine

## 2013-04-29 LAB — COMPREHENSIVE METABOLIC PANEL
ALT: 24 U/L (ref 0–35)
AST: 26 U/L (ref 0–37)
CO2: 35 mEq/L — ABNORMAL HIGH (ref 19–32)
Calcium: 9.3 mg/dL (ref 8.4–10.5)
Chloride: 102 mEq/L (ref 96–112)
Sodium: 140 mEq/L (ref 135–145)
Total Protein: 7 g/dL (ref 6.0–8.3)

## 2013-04-29 LAB — LIPID PANEL
LDL Cholesterol: 149 mg/dL — ABNORMAL HIGH (ref 0–99)
VLDL: 20 mg/dL (ref 0–40)

## 2013-04-29 LAB — HEMOGLOBIN A1C: Hgb A1c MFr Bld: 5.3 % (ref <5.7)

## 2013-05-05 ENCOUNTER — Ambulatory Visit: Payer: BC Managed Care – PPO | Admitting: Family Medicine

## 2013-05-07 ENCOUNTER — Ambulatory Visit (INDEPENDENT_AMBULATORY_CARE_PROVIDER_SITE_OTHER): Payer: BC Managed Care – PPO | Admitting: Family Medicine

## 2013-05-07 ENCOUNTER — Encounter: Payer: Self-pay | Admitting: Family Medicine

## 2013-05-07 VITALS — BP 138/84 | HR 82 | Resp 16 | Ht 70.0 in | Wt 222.4 lb

## 2013-05-07 DIAGNOSIS — G5600 Carpal tunnel syndrome, unspecified upper limb: Secondary | ICD-10-CM

## 2013-05-07 DIAGNOSIS — B009 Herpesviral infection, unspecified: Secondary | ICD-10-CM

## 2013-05-07 DIAGNOSIS — E669 Obesity, unspecified: Secondary | ICD-10-CM

## 2013-05-07 DIAGNOSIS — J309 Allergic rhinitis, unspecified: Secondary | ICD-10-CM

## 2013-05-07 DIAGNOSIS — G5601 Carpal tunnel syndrome, right upper limb: Secondary | ICD-10-CM

## 2013-05-07 DIAGNOSIS — I10 Essential (primary) hypertension: Secondary | ICD-10-CM

## 2013-05-07 DIAGNOSIS — E785 Hyperlipidemia, unspecified: Secondary | ICD-10-CM

## 2013-05-07 MED ORDER — LORATADINE 10 MG PO TABS: 10.0000 mg | ORAL_TABLET | Freq: Every day | ORAL | Status: DC

## 2013-05-07 MED ORDER — FLUTICASONE PROPIONATE 50 MCG/ACT NA SUSP: 2.0000 | Freq: Every day | NASAL | Status: DC

## 2013-05-07 NOTE — Progress Notes (Signed)
  Subjective:    Patient ID: Allison Hart, female    DOB: 06/02/1961, 52 y.o.   MRN: [id number]  HPI The PT is here for follow up and re-evaluation of chronic medical conditions, medication management and review of any available recent lab and radiology data.  Preventive health is updated, specifically  Cancer screening and Immunization.    The PT denies any adverse reactions to current medications since the last visit.  C/o right hand pain and weakness, also radiates to elbow, realizes the need to do something before it continues to worsen, has a dx of carpal tunnel syndrome No regular exercise and inconsistent diet with no change in weight    Review of Systems See HPI Denies recent fever or chills. Chronic sinus stuffiness and  nasal congestion, denies ear pain or sore throat.non compliant with allergy meds  Denies chest congestion, productive cough or wheezing. Denies chest pains, palpitations and leg swelling Denies abdominal pain, nausea, vomiting,diarrhea or constipation.   Denies dysuria, frequency, hesitancy or incontinence. Denies headaches, seizures, numbness, or tingling. Denies depression, anxiety or insomnia. Denies skin break down or rash.        Objective:   Physical Exam  Patient alert and oriented and in no cardiopulmonary distress.  HEENT: No facial asymmetry, EOMI, no sinus tenderness,  oropharynx pink and moist.  Neck supple no adenopathy.  Chest: Clear to auscultation bilaterally.  CVS: S1, S2 no murmurs, no S3.  ABD: Soft non tender. Bowel sounds normal.  Ext: No edema  MS: Adequate ROM spine, shoulders, hips and knees.Decreased power in right hand, thenar wasting and positive flick test  Skin: Intact, no ulcerations or rash noted.  Psych: Good eye contact, normal affect. Memory intact not anxious or depressed appearing.  CNS: CN 2-12 intact, power, tone and sensation normal throughout.       Assessment & Plan:

## 2013-05-07 NOTE — Patient Instructions (Addendum)
F/u with rectal in 4 month, call if you need me before  Daily exercise for 30 minutes and change in eating habits for improved health as discussed.  Weight loss goal of 8 pounds  Improved cholesterol with no medication  Fasting lipid profile and CBc in 4 month, 3 to 5 days befor appt

## 2013-05-10 NOTE — Assessment & Plan Note (Signed)
Deteriorated Hyperlipidemia:Low fat diet discussed and encouraged.   

## 2013-05-10 NOTE — Assessment & Plan Note (Signed)
Unchanged. Patient re-educated about  the importance of commitment to a  minimum of 150 minutes of exercise per week. The importance of healthy food choices with portion control discussed. Encouraged to start a food diary, count calories and to consider  joining a support group. Sample diet sheets offered. Goals set by the patient for the next several months.    

## 2013-05-10 NOTE — Assessment & Plan Note (Signed)
Controlled, no change in medication DASH diet and commitment to daily physical activity for a minimum of 30 minutes discussed and encouraged, as a part of hypertension management. The importance of attaining a healthy weight is also discussed.  

## 2013-05-10 NOTE — Assessment & Plan Note (Signed)
Uses prophylactic medication intermittently , and is controlled

## 2013-05-10 NOTE — Assessment & Plan Note (Signed)
Uncontrolled, needs to commit to daily medication 

## 2013-05-10 NOTE — Assessment & Plan Note (Signed)
Increased pain and weakness c/o dropping objects, refer to ortho

## 2013-05-15 ENCOUNTER — Telehealth: Payer: Self-pay | Admitting: Family Medicine

## 2013-05-15 DIAGNOSIS — J309 Allergic rhinitis, unspecified: Secondary | ICD-10-CM

## 2013-05-15 MED ORDER — LORATADINE 10 MG PO TABS: 10.0000 mg | ORAL_TABLET | Freq: Every day | ORAL | Status: DC

## 2013-05-15 NOTE — Telephone Encounter (Signed)
Sent to [facility] in [place] as requested

## 2013-06-04 ENCOUNTER — Telehealth: Payer: Self-pay | Admitting: Family Medicine

## 2013-06-04 NOTE — Telephone Encounter (Signed)
Will call her back.

## 2013-07-09 ENCOUNTER — Other Ambulatory Visit: Payer: Self-pay | Admitting: Family Medicine

## 2013-07-12 ENCOUNTER — Other Ambulatory Visit: Payer: Self-pay | Admitting: Family Medicine

## 2013-08-13 ENCOUNTER — Encounter: Payer: Self-pay | Admitting: Family Medicine

## 2013-08-13 ENCOUNTER — Other Ambulatory Visit: Payer: Self-pay | Admitting: Family Medicine

## 2013-08-13 DIAGNOSIS — Z139 Encounter for screening, unspecified: Secondary | ICD-10-CM

## 2013-08-14 ENCOUNTER — Other Ambulatory Visit: Payer: Self-pay

## 2013-08-16 ENCOUNTER — Other Ambulatory Visit: Payer: Self-pay | Admitting: Family Medicine

## 2013-08-25 ENCOUNTER — Other Ambulatory Visit: Payer: Self-pay | Admitting: Family Medicine

## 2013-09-08 ENCOUNTER — Ambulatory Visit: Payer: BC Managed Care – PPO | Admitting: Family Medicine

## 2013-09-15 ENCOUNTER — Ambulatory Visit (HOSPITAL_COMMUNITY)
Admission: RE | Admit: 2013-09-15 | Discharge: 2013-09-15 | Disposition: A | Discharge status: Home / Self Care | Payer: BC Managed Care – PPO | Source: Ambulatory Visit | Attending: Family Medicine | Admitting: Family Medicine

## 2013-09-15 DIAGNOSIS — Z1231 Encounter for screening mammogram for malignant neoplasm of breast: Secondary | ICD-10-CM | POA: Insufficient documentation

## 2013-09-15 DIAGNOSIS — Z139 Encounter for screening, unspecified: Secondary | ICD-10-CM

## 2013-09-15 LAB — CBC
Hemoglobin: 12.2 g/dL (ref 12.0–15.0)
MCH: 29 pg (ref 26.0–34.0)
MCHC: 33.6 g/dL (ref 30.0–36.0)
Platelets: 260 10*3/uL (ref 150–400)
RBC: 4.21 MIL/uL (ref 3.87–5.11)

## 2013-09-15 LAB — LIPID PANEL
Cholesterol: 206 mg/dL — ABNORMAL HIGH (ref 0–200)
LDL Cholesterol: 143 mg/dL — ABNORMAL HIGH (ref 0–99)
Total CHOL/HDL Ratio: 4.5 Ratio
Triglycerides: 85 mg/dL (ref <150)
VLDL: 17 mg/dL (ref 0–40)

## 2013-09-17 ENCOUNTER — Ambulatory Visit: Payer: BC Managed Care – PPO | Admitting: Family Medicine

## 2013-09-17 ENCOUNTER — Other Ambulatory Visit: Payer: Self-pay | Admitting: Family Medicine

## 2013-09-17 DIAGNOSIS — R928 Other abnormal and inconclusive findings on diagnostic imaging of breast: Secondary | ICD-10-CM

## 2013-09-18 ENCOUNTER — Other Ambulatory Visit: Payer: Self-pay | Admitting: Family Medicine

## 2013-09-18 DIAGNOSIS — R928 Other abnormal and inconclusive findings on diagnostic imaging of breast: Secondary | ICD-10-CM

## 2013-09-24 ENCOUNTER — Ambulatory Visit
Admission: RE | Admit: 2013-09-24 | Discharge: 2013-09-24 | Disposition: A | Discharge status: Home / Self Care | Payer: BC Managed Care – PPO | Source: Ambulatory Visit | Attending: Family Medicine | Admitting: Family Medicine

## 2013-09-24 DIAGNOSIS — R928 Other abnormal and inconclusive findings on diagnostic imaging of breast: Secondary | ICD-10-CM

## 2013-10-13 ENCOUNTER — Other Ambulatory Visit: Payer: Self-pay | Admitting: Family Medicine

## 2013-11-04 ENCOUNTER — Encounter: Payer: Self-pay | Admitting: Family Medicine

## 2013-11-04 ENCOUNTER — Ambulatory Visit (INDEPENDENT_AMBULATORY_CARE_PROVIDER_SITE_OTHER): Payer: BC Managed Care – PPO | Admitting: Family Medicine

## 2013-11-04 VITALS — BP 112/68 | HR 70 | Resp 18 | Ht 70.0 in | Wt 219.0 lb

## 2013-11-04 DIAGNOSIS — M25569 Pain in unspecified knee: Secondary | ICD-10-CM

## 2013-11-04 DIAGNOSIS — Z23 Encounter for immunization: Secondary | ICD-10-CM

## 2013-11-04 DIAGNOSIS — J309 Allergic rhinitis, unspecified: Secondary | ICD-10-CM

## 2013-11-04 DIAGNOSIS — K219 Gastro-esophageal reflux disease without esophagitis: Secondary | ICD-10-CM

## 2013-11-04 DIAGNOSIS — M25562 Pain in left knee: Secondary | ICD-10-CM | POA: Insufficient documentation

## 2013-11-04 DIAGNOSIS — I10 Essential (primary) hypertension: Secondary | ICD-10-CM

## 2013-11-04 DIAGNOSIS — E8881 Metabolic syndrome: Secondary | ICD-10-CM

## 2013-11-04 DIAGNOSIS — E669 Obesity, unspecified: Secondary | ICD-10-CM

## 2013-11-04 DIAGNOSIS — E785 Hyperlipidemia, unspecified: Secondary | ICD-10-CM

## 2013-11-04 MED ORDER — PREDNISONE (PAK) 5 MG PO TABS: 5.0000 mg | ORAL_TABLET | ORAL | Status: DC

## 2013-11-04 MED ORDER — KETOROLAC TROMETHAMINE 60 MG/2ML IM SOLN
60.0000 mg | Freq: Once | INTRAMUSCULAR | Status: AC
  Administered 2013-11-04: 60 mg via INTRAMUSCULAR

## 2013-11-04 MED ORDER — METHYLPREDNISOLONE ACETATE 80 MG/ML IJ SUSP
80.0000 mg | Freq: Once | INTRAMUSCULAR | Status: AC
  Administered 2013-11-04: 80 mg via INTRAMUSCULAR

## 2013-11-04 MED ORDER — ATORVASTATIN CALCIUM 20 MG PO TABS: 20.0000 mg | ORAL_TABLET | Freq: Every day | ORAL | Status: DC

## 2013-11-04 NOTE — Patient Instructions (Signed)
F/u in 5 month with rectal, call if you need me before  Flu vaccine today  BP is excellent and you are coping well with all your responsibilities  Toradol 60mg  iM and depo medrol 80 mg IM today for left knee pain and 6 day course of prednisone prescribed. Call if pain and instability persist , you will be referred to rothiopedics at that time  For allergies, flush nose with saline twice daily, OK to take sudafed 1 daily for 3 to 5 days for excessive nasal drainage  Congrats on weight loss, continue to reduce sugar and fat in diet to improve health.  New for cholesterol is lipitor , take 1 every night  Fasting lipid and CMP in 5 month before f/u

## 2013-11-16 ENCOUNTER — Other Ambulatory Visit: Payer: Self-pay | Admitting: Family Medicine

## 2013-11-29 NOTE — Assessment & Plan Note (Addendum)
wrosening and also experiencing instability, ortho eval needed, pt electing to hold off at this time Anti inflammatories in office followed by short course of prednisone

## 2013-11-29 NOTE — Assessment & Plan Note (Signed)
Controlled, no change in medication DASH diet and commitment to daily physical activity for a minimum of 30 minutes discussed and encouraged, as a part of hypertension management. The importance of attaining a healthy weight is also discussed.  

## 2013-11-29 NOTE — Assessment & Plan Note (Signed)
The increased risk of cardiovascular disease associated with this diagnosis, and the need to consistently work on lifestyle to change this is discussed. Following  a  heart healthy diet ,commitment to 30 minutes of exercise at least 5 days per week, as well as control of blood sugar and cholesterol , and achieving a healthy weight are all the areas to be addressed .  

## 2013-11-29 NOTE — Progress Notes (Signed)
   Subjective:    Patient ID: Allison Hart, female    DOB: 10-Sep-1961, 53 y.o.   MRN: [id number]  HPI The PT is here for follow up and re-evaluation of chronic medical conditions, medication management and review of any available recent lab and radiology data.  Preventive health is updated, specifically  Cancer screening and Immunization.    The PT denies any adverse reactions to current medications since the last visit.  There are no new concern. Is under increased stress caring both for her father as well as her husband's father both of whom are ill.  C/o increased left knee pain and instability, has buckled , but denies falling. No interest in surgery now, unable to take the time form her work and other responsibilities with family. changing her diet to facilitate improved health, no regular exercise and somewhat limited due to knee      Review of Systems See HPI Denies recent fever or chills. Denies sinus pressure, nasal congestion, ear pain or sore throat. Denies chest congestion, productive cough or wheezing. Denies chest pains, palpitations and leg swelling Denies abdominal pain, nausea, vomiting,diarrhea or constipation.   Denies dysuria, frequency, hesitancy or incontinence.  Denies headaches, seizures, numbness, or tingling. Denies depression, anxiety or insomnia. Denies skin break down or rash.        Objective:   Physical Exam BP 112/68  Pulse 70  Resp 18  Ht 5\' 10"  (1.778 m)  Wt 219 lb 0.6 oz (99.356 kg)  BMI 31.43 kg/m2  SpO2 98% Patient alert and oriented and in no cardiopulmonary distress.  HEENT: No facial asymmetry, EOMI, no sinus tenderness,  oropharynx pink and moist.  Neck supple no adenopathy.  Chest: Clear to auscultation bilaterally.  CVS: S1, S2 no murmurs, no S3.  ABD: Soft non tender. Bowel sounds normal.  Ext: No edema  MS: Adequate ROM spine, shoulders, hips an reduced in left  Knee which is tender on the lateral aspect.  Skin:  Intact, no ulcerations or rash noted.  Psych: Good eye contact, normal affect. Memory intact not anxious or depressed appearing.  CNS: CN 2-12 intact, power, tone and sensation normal throughout.        Assessment & Plan:  HYPERTENSION Controlled, no change in medication DASH diet and commitment to daily physical activity for a minimum of 30 minutes discussed and encouraged, as a part of hypertension management. The importance of attaining a healthy weight is also discussed.   GERD Controlled, no change in medication Uses med as needed , advised to reduce caffeine intake   Lateral knee pain wrosening and also experiencing instability, ortho eval needed, pt electing to hold off at this time Anti inflammatories in office followed by short course of prednisone  Hyperlipidemia Unchanged and elevated Hyperlipidemia:Low fat diet discussed and encouraged.  Med compliance stressed  OBESITY Improved. Pt applauded on succesful weight loss through lifestyle change, and encouraged to continue same. Weight loss goal set for the next several months.

## 2013-11-29 NOTE — Assessment & Plan Note (Signed)
Improved. Pt applauded on succesful weight loss through lifestyle change, and encouraged to continue same. Weight loss goal set for the next several months.  

## 2013-11-29 NOTE — Assessment & Plan Note (Signed)
Controlled, no change in medication Uses med as needed , advised to reduce caffeine intake

## 2013-11-29 NOTE — Assessment & Plan Note (Signed)
Unchanged and elevated Hyperlipidemia:Low fat diet discussed and encouraged.  Med compliance stressed

## 2014-02-23 ENCOUNTER — Other Ambulatory Visit: Payer: Self-pay | Admitting: Family Medicine

## 2014-03-24 ENCOUNTER — Other Ambulatory Visit: Payer: Self-pay | Admitting: Family Medicine

## 2014-03-30 LAB — COMPREHENSIVE METABOLIC PANEL
ALK PHOS: 104 U/L (ref 39–117)
ALT: 48 U/L — ABNORMAL HIGH (ref 0–35)
AST: 41 U/L — AB (ref 0–37)
Albumin: 4.3 g/dL (ref 3.5–5.2)
BUN: 10 mg/dL (ref 6–23)
CO2: 34 mEq/L — ABNORMAL HIGH (ref 19–32)
CREATININE: 0.64 mg/dL (ref 0.50–1.10)
Calcium: 9.7 mg/dL (ref 8.4–10.5)
Chloride: 99 mEq/L (ref 96–112)
Glucose, Bld: 83 mg/dL (ref 70–99)
POTASSIUM: 4.1 meq/L (ref 3.5–5.3)
Sodium: 139 mEq/L (ref 135–145)
Total Bilirubin: 0.7 mg/dL (ref 0.2–1.2)
Total Protein: 7.1 g/dL (ref 6.0–8.3)

## 2014-03-30 LAB — LIPID PANEL
CHOL/HDL RATIO: 3.3 ratio
Cholesterol: 167 mg/dL (ref 0–200)
HDL: 50 mg/dL (ref >39)
LDL CALC: 104 mg/dL — AB (ref 0–99)
Triglycerides: 64 mg/dL (ref <150)
VLDL: 13 mg/dL (ref 0–40)

## 2014-04-06 ENCOUNTER — Encounter: Payer: Self-pay | Admitting: Family Medicine

## 2014-04-06 ENCOUNTER — Ambulatory Visit (INDEPENDENT_AMBULATORY_CARE_PROVIDER_SITE_OTHER): Payer: BC Managed Care – PPO | Admitting: Family Medicine

## 2014-04-06 ENCOUNTER — Other Ambulatory Visit (HOSPITAL_COMMUNITY)
Admission: RE | Admit: 2014-04-06 | Discharge: 2014-04-06 | Disposition: A | Discharge status: Home / Self Care | Payer: BC Managed Care – PPO | Source: Ambulatory Visit | Attending: Family Medicine | Admitting: Family Medicine

## 2014-04-06 VITALS — BP 132/78 | HR 92 | Resp 18 | Ht 70.0 in | Wt 219.0 lb

## 2014-04-06 DIAGNOSIS — Z1211 Encounter for screening for malignant neoplasm of colon: Secondary | ICD-10-CM

## 2014-04-06 DIAGNOSIS — R102 Pelvic and perineal pain: Secondary | ICD-10-CM

## 2014-04-06 DIAGNOSIS — E669 Obesity, unspecified: Secondary | ICD-10-CM

## 2014-04-06 DIAGNOSIS — Z113 Encounter for screening for infections with a predominantly sexual mode of transmission: Secondary | ICD-10-CM

## 2014-04-06 DIAGNOSIS — N760 Acute vaginitis: Secondary | ICD-10-CM | POA: Insufficient documentation

## 2014-04-06 DIAGNOSIS — I10 Essential (primary) hypertension: Secondary | ICD-10-CM

## 2014-04-06 DIAGNOSIS — M549 Dorsalgia, unspecified: Secondary | ICD-10-CM

## 2014-04-06 DIAGNOSIS — N949 Unspecified condition associated with female genital organs and menstrual cycle: Secondary | ICD-10-CM

## 2014-04-06 DIAGNOSIS — M25562 Pain in left knee: Secondary | ICD-10-CM

## 2014-04-06 DIAGNOSIS — E785 Hyperlipidemia, unspecified: Secondary | ICD-10-CM

## 2014-04-06 DIAGNOSIS — M25569 Pain in unspecified knee: Secondary | ICD-10-CM

## 2014-04-06 LAB — POC HEMOCCULT BLD/STL (OFFICE/1-CARD/DIAGNOSTIC): Fecal Occult Blood, POC: NEGATIVE

## 2014-04-06 MED ORDER — GABAPENTIN 100 MG PO CAPS: 100.0000 mg | ORAL_CAPSULE | Freq: Three times a day (TID) | ORAL | Status: DC

## 2014-04-06 NOTE — Assessment & Plan Note (Signed)
Improved but not at goal, no med change, needs to rept lft in 4 to 6 weeks due to mild elevation, work on weight loss and dietary change Hyperlipidemia:Low fat diet discussed and encouraged.

## 2014-04-06 NOTE — Assessment & Plan Note (Addendum)
Uncontrolled increasingly debilitating, refer to ortho

## 2014-04-06 NOTE — Assessment & Plan Note (Addendum)
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

## 2014-04-06 NOTE — Progress Notes (Signed)
Subjective:    Patient ID: Allison Hart, female    DOB: September 19, 1961, 53 y.o.   MRN: [id number]  HPI  The PT is here for follow up and re-evaluation of chronic medical conditions, medication management and review of any available recent lab and radiology data.  Preventive health is updated, specifically  Cancer screening and Immunization.    The PT denies any adverse reactions to current medications since the last visit.  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  Increased personal and emotional stress, she is caring for her father who has both dementia and recurrent prostate cancer involving the bones, now on a new trial med      Review of Systems See HPI Denies recent fever or chills. Denies sinus pressure, nasal congestion, ear pain or sore throat. Denies chest congestion, productive cough or wheezing. Denies chest pains, palpitations and leg swelling Denies abdominal pain, nausea, vomiting,diarrhea or constipation.  Denies change in stool caliber or character Denies dysuria, frequency, hesitancy or incontinence.  Denies headaches, seizures, numbness, or tingling. Denies depression, uncontrolled  anxiety or insomnia. Denies skin break down or rash.        Objective:   Physical Exam BP 132/78  Pulse 92  Resp 18  Ht 5\' 10"  (1.778 m)  Wt 219 lb (99.338 kg)  BMI 31.42 kg/m2  SpO2 98% Patient alert and oriented and in no cardiopulmonary distress.  HEENT: No facial asymmetry, EOMI,   oropharynx pink and moist.  Neck supple no JVD, no mass.  Chest: Clear to auscultation bilaterally.  CVS: S1, S2 no murmurs, no S3.Regular rate.  ABD: Soft non tender. No organomegaly or mass palpable. Normal BS Rectal: good sphincter tone, no palpable mass, heme  negative stool  Ext: No edema  MS: Adequate though reduced  ROM lumbar spine,  And left knee, normal in right knee , and both shouldeers  Skin: Intact, no ulcerations or rash noted.  Psych: Good eye contact, normal affect. Memory intact not anxious or depressed appearing.  CNS: CN 2-12 intact, power,  normal throughout.no focal deficits noted.        Assessment & Plan:  HYPERTENSION Controlled, no change in medication DASH diet and commitment to daily physical activity for a minimum of 30 minutes discussed and encouraged, as a part of hypertension management. The importance of attaining a healthy weight is also discussed.   Hyperlipidemia LDL goal <100 Improved but not at goal, no med change, needs to rept lft in 4 to 6 weeks due to mild elevation, work on weight loss and dietary change Hyperlipidemia:Low fat diet discussed and encouraged.    Pain in left knee Uncontrolled increasingly debilitating, refer to ortho  Pelvic pain in female Increased pelvic pain and increased vag d/c in past several months, needs pelvic US and self collected STD testing  OBESITY Unchanged. Patient re-educated about  the importance of commitment to a  minimum of 150 minutes of exercise per week. The importance of healthy food choices with portion control discussed. Encouraged to start a food diary, count calories and to consider  joining a support group. Sample diet sheets offered. Goals set by the patient for the next several months.     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

## 2014-04-06 NOTE — Patient Instructions (Addendum)
F/u in 3.5  month, call if you need me before  You are referred to orthopedics and for [place] of pelvis.  Pls get xray of back today if possible  Call mid next week re referral appts as discussed.  Self collected specimens today to evaluate for pelvic infection as discussed  New for burning in feet is gabapentin, start with 1 at night increase every 5 days up to 3 at night if needed (script says one three times daily , do no take like this)  pls change eating habits you appear to have fatty liver  Non fasting hepatic panel in 6 weeks  Rectal exam today

## 2014-04-06 NOTE — Assessment & Plan Note (Addendum)
Unchanged. Patient re-educated about  the importance of commitment to a  minimum of 150 minutes of exercise per week. The importance of healthy food choices with portion control discussed. Encouraged to start a food diary, count calories and to consider  joining a support group. Sample diet sheets offered. Goals set by the patient for the next several months.    

## 2014-04-06 NOTE — Assessment & Plan Note (Signed)
Increased pelvic pain and increased vag d/c in past several months, needs pelvic US and self collected STD testing

## 2014-04-06 NOTE — Assessment & Plan Note (Signed)
Controlled, no change in medication DASH diet and commitment to daily physical activity for a minimum of 30 minutes discussed and encouraged, as a part of hypertension management. The importance of attaining a healthy weight is also discussed.  

## 2014-04-15 ENCOUNTER — Ambulatory Visit (HOSPITAL_COMMUNITY)
Admission: RE | Admit: 2014-04-15 | Discharge: 2014-04-15 | Disposition: A | Discharge status: Home / Self Care | Payer: BC Managed Care – PPO | Source: Ambulatory Visit | Attending: Family Medicine | Admitting: Family Medicine

## 2014-04-15 DIAGNOSIS — N949 Unspecified condition associated with female genital organs and menstrual cycle: Secondary | ICD-10-CM | POA: Insufficient documentation

## 2014-04-15 DIAGNOSIS — R102 Pelvic and perineal pain: Secondary | ICD-10-CM

## 2014-04-16 ENCOUNTER — Other Ambulatory Visit: Payer: Self-pay

## 2014-04-16 DIAGNOSIS — N83202 Unspecified ovarian cyst, left side: Secondary | ICD-10-CM

## 2014-04-17 ENCOUNTER — Other Ambulatory Visit: Payer: Self-pay | Admitting: Family Medicine

## 2014-06-09 ENCOUNTER — Other Ambulatory Visit: Payer: Self-pay

## 2014-06-09 DIAGNOSIS — M549 Dorsalgia, unspecified: Secondary | ICD-10-CM

## 2014-06-09 MED ORDER — GABAPENTIN 100 MG PO CAPS: 100.0000 mg | ORAL_CAPSULE | Freq: Three times a day (TID) | ORAL | Status: DC

## 2014-06-22 DIAGNOSIS — Z1211 Encounter for screening for malignant neoplasm of colon: Secondary | ICD-10-CM | POA: Insufficient documentation

## 2014-06-22 DIAGNOSIS — N760 Acute vaginitis: Secondary | ICD-10-CM | POA: Insufficient documentation

## 2014-06-22 NOTE — Assessment & Plan Note (Signed)
Normal exam, heme negative stool

## 2014-06-22 NOTE — Assessment & Plan Note (Signed)
Self collected specimens to be sent for testing for STD

## 2014-07-08 ENCOUNTER — Other Ambulatory Visit: Payer: Self-pay

## 2014-07-08 MED ORDER — AMLODIPINE BESYLATE 5 MG PO TABS: ORAL_TABLET | ORAL | Status: DC

## 2014-07-13 ENCOUNTER — Other Ambulatory Visit: Payer: Self-pay

## 2014-07-13 MED ORDER — FLUTICASONE PROPIONATE 50 MCG/ACT NA SUSP: NASAL | Status: DC

## 2014-07-22 ENCOUNTER — Ambulatory Visit: Payer: BC Managed Care – PPO | Admitting: Family Medicine

## 2014-08-10 ENCOUNTER — Other Ambulatory Visit: Payer: Self-pay | Admitting: Family Medicine

## 2014-08-24 ENCOUNTER — Other Ambulatory Visit: Payer: Self-pay | Admitting: Family Medicine

## 2014-10-19 ENCOUNTER — Other Ambulatory Visit: Payer: Self-pay | Admitting: Family Medicine

## 2014-10-19 LAB — HEPATIC FUNCTION PANEL
ALT: 26 U/L (ref 0–35)
AST: 29 U/L (ref 0–37)
Albumin: 4.1 g/dL (ref 3.5–5.2)
Alkaline Phosphatase: 84 U/L (ref 39–117)
Bilirubin, Direct: 0.1 mg/dL (ref 0.0–0.3)
Indirect Bilirubin: 0.6 mg/dL (ref 0.2–1.2)
Total Bilirubin: 0.7 mg/dL (ref 0.2–1.2)
Total Protein: 6.9 g/dL (ref 6.0–8.3)

## 2014-10-20 LAB — LIPID PANEL
Cholesterol: 180 mg/dL (ref 0–200)
HDL: 48 mg/dL (ref >39)
LDL Cholesterol: 114 mg/dL — ABNORMAL HIGH (ref 0–99)
Total CHOL/HDL Ratio: 3.8 Ratio
Triglycerides: 92 mg/dL (ref <150)
VLDL: 18 mg/dL (ref 0–40)

## 2014-10-20 LAB — BASIC METABOLIC PANEL
BUN: 10 mg/dL (ref 6–23)
CALCIUM: 9.5 mg/dL (ref 8.4–10.5)
CO2: 31 mEq/L (ref 19–32)
Chloride: 96 mEq/L (ref 96–112)
Creat: 0.65 mg/dL (ref 0.50–1.10)
GLUCOSE: 81 mg/dL (ref 70–99)
POTASSIUM: 3.7 meq/L (ref 3.5–5.3)
SODIUM: 139 meq/L (ref 135–145)

## 2014-10-21 ENCOUNTER — Ambulatory Visit: Payer: BLUE CROSS/BLUE SHIELD | Admitting: Family Medicine

## 2014-10-26 ENCOUNTER — Encounter: Payer: Self-pay | Admitting: Family Medicine

## 2014-10-26 ENCOUNTER — Ambulatory Visit (INDEPENDENT_AMBULATORY_CARE_PROVIDER_SITE_OTHER): Payer: BLUE CROSS/BLUE SHIELD | Admitting: Family Medicine

## 2014-10-26 VITALS — BP 108/78 | HR 80 | Resp 18 | Ht 72.0 in | Wt 220.1 lb

## 2014-10-26 DIAGNOSIS — E785 Hyperlipidemia, unspecified: Secondary | ICD-10-CM

## 2014-10-26 DIAGNOSIS — E559 Vitamin D deficiency, unspecified: Secondary | ICD-10-CM

## 2014-10-26 DIAGNOSIS — G5601 Carpal tunnel syndrome, right upper limb: Secondary | ICD-10-CM

## 2014-10-26 DIAGNOSIS — Z23 Encounter for immunization: Secondary | ICD-10-CM

## 2014-10-26 DIAGNOSIS — Z1231 Encounter for screening mammogram for malignant neoplasm of breast: Secondary | ICD-10-CM

## 2014-10-26 DIAGNOSIS — I10 Essential (primary) hypertension: Secondary | ICD-10-CM

## 2014-10-26 DIAGNOSIS — J3089 Other allergic rhinitis: Secondary | ICD-10-CM

## 2014-10-26 DIAGNOSIS — M25562 Pain in left knee: Secondary | ICD-10-CM

## 2014-10-26 DIAGNOSIS — E669 Obesity, unspecified: Secondary | ICD-10-CM

## 2014-10-26 DIAGNOSIS — B009 Herpesviral infection, unspecified: Secondary | ICD-10-CM

## 2014-10-26 MED ORDER — ACYCLOVIR 800 MG PO TABS: 800.0000 mg | ORAL_TABLET | Freq: Two times a day (BID) | ORAL | Status: DC

## 2014-10-26 NOTE — Assessment & Plan Note (Signed)
Vaccine administered at visit.  

## 2014-10-26 NOTE — Assessment & Plan Note (Signed)
Controlled, no change in medication  

## 2014-10-26 NOTE — Assessment & Plan Note (Signed)
Deteriorated, elevated lDL Hyperlipidemia:Low fat diet discussed and encouraged.  No med change f/u in 6 months

## 2014-10-26 NOTE — Assessment & Plan Note (Signed)
Pt to change from  Daily prophylactic med to as needed med for flare, i explained the reasonming and she agrees. Will call back if this does not suit her

## 2014-10-26 NOTE — Assessment & Plan Note (Signed)
Ongoing pain , but no intent on ortho re eval in the near future, does the "best she can"

## 2014-10-26 NOTE — Patient Instructions (Addendum)
F/u with rectal July 1 or after , call if you need me before  Blood pressure is excellent and you are doing better overall.  Bad cholesterol has increased so watch food choices please as we discussed  Mwedication will be sent for flare up of herpes , since ou do not need to take medication every day, if this does not work for you pls call back and let me know    [facility] is [facility] scheduled this is past due   All the best for 2016!  CBC, vit D , TSH, fasting lipid, cmp in end June before visit  Flu vaccine today

## 2014-10-26 NOTE — Assessment & Plan Note (Signed)
Conservative managemen wrist braces at night , vit B and naproxen as needed. Cautioned against daily use of NSAIDS, she understands judicious use only

## 2014-10-26 NOTE — Assessment & Plan Note (Signed)
Controlled, no change in medication DASH diet and commitment to daily physical activity for a minimum of 30 minutes discussed and encouraged, as a part of hypertension management. The importance of attaining a healthy weight is also discussed.  

## 2014-10-26 NOTE — Progress Notes (Signed)
Subjective:    Patient ID: Allison Hart, female    DOB: October 29, 1960, 54 y.o.   MRN: [id number]  HPI The PT is here for follow up and re-evaluation of chronic medical conditions, medication management and review of any available recent lab and radiology data.  Preventive health is updated, specifically  Cancer screening and Immunization.  Needs and is taking flu vaccine, mammogram needs to be done Questions or concerns regarding consultations or procedures which the PT has had in the interim are  Addressed.Non operative management of her carpal tunnel syndrome, and also saw gyne re ovarian cyst States that twice daily naproxen for several weeks helped a lot, states she got an injection in her left knee and will never do this again feels she had a bad reaction The PT denies any adverse reactions to current medications since the last visit.  Managing her father's health needs in a less stressed manner. Not committed to healthy lifestyle changes as much as she would like to be "no time" but intends to continue to work on this    Review of Systems See HPI Denies recent fever or chills. Denies sinus pressure, nasal congestion, ear pain or sore throat. Denies chest congestion, productive cough or wheezing. Denies chest pains, palpitations and leg swelling Denies abdominal pain, nausea, vomiting,diarrhea or constipation.   Denies dysuria, frequency, hesitancy or incontinence. Chronic knee pain with limitation in mobility. Denies headaches, seizures, numbness, or tingling. Denies depression, uncontrolled  anxiety or insomnia. Denies skin break down or rash.        Objective:   Physical Exam  BP 108/78 mmHg  Pulse 80  Resp 18  Ht 6' (1.829 m)  Wt 220 lb 1.9 oz (99.846 kg)  BMI 29.85 kg/m2  SpO2 97% Patient alert and oriented and in no cardiopulmonary distress.  HEENT: No facial asymmetry, EOMI,   oropharynx pink and moist.  Neck supple no JVD, no mass.  Chest: Clear to  auscultation bilaterally.  CVS: S1, S2 no murmurs, no S3.Regular rate.  ABD: Soft non tender.   Ext: No edema  MS: Adequate ROM spine, shoulders, hips and knees.  Skin: Intact, no ulcerations or rash noted.  Psych: Good eye contact, normal affect. Memory intact not anxious or depressed appearing.  CNS: CN 2-12 intact, power,  normal throughout.no focal deficits noted.       Assessment & Plan:  Essential hypertension Controlled, no change in medication DASH diet and commitment to daily physical activity for a minimum of 30 minutes discussed and encouraged, as a part of hypertension management. The importance of attaining a healthy weight is also discussed.    Need for prophylactic vaccination and inoculation against influenza Vaccine administered at visit.    Pain in left knee Ongoing pain , but no intent on ortho re eval in the near future, does the "best she can"   Hyperlipidemia LDL goal <100 Deteriorated, elevated lDL Hyperlipidemia:Low fat diet discussed and encouraged.  No med change f/u in 6 months   Obesity (BMI 30-39.9) Unchnaged. Patient re-educated about  the importance of commitment to a  minimum of 150 minutes of exercise per week. The importance of healthy food choices with portion control discussed. Encouraged to start a food diary, count calories and to consider  joining a support group. Sample diet sheets offered. Goals set by the patient for the next several months.      Carpal tunnel syndrome Conservative managemen wrist braces at night , vit B and naproxen as  needed. Cautioned against daily use of NSAIDS, she understands judicious use only   Allergic rhinitis Controlled, no change in medication    Western blot positive HSV2 Pt to change from  Daily prophylactic med to as needed med for flare, i explained the reasonming and she agrees. Will call back if this does not suit her

## 2014-10-26 NOTE — Assessment & Plan Note (Signed)
Unchnaged. Patient re-educated about  the importance of commitment to a  minimum of 150 minutes of exercise per week. The importance of healthy food choices with portion control discussed. Encouraged to start a food diary, count calories and to consider  joining a support group. Sample diet sheets offered. Goals set by the patient for the next several months.    

## 2014-11-02 ENCOUNTER — Ambulatory Visit
Admission: RE | Admit: 2014-11-02 | Discharge: 2014-11-02 | Disposition: A | Discharge status: Home / Self Care | Payer: BLUE CROSS/BLUE SHIELD | Source: Ambulatory Visit | Attending: Family Medicine | Admitting: Family Medicine

## 2014-11-02 DIAGNOSIS — Z1231 Encounter for screening mammogram for malignant neoplasm of breast: Secondary | ICD-10-CM

## 2014-11-03 ENCOUNTER — Other Ambulatory Visit: Payer: Self-pay | Admitting: Family Medicine

## 2014-12-01 ENCOUNTER — Other Ambulatory Visit: Payer: Self-pay | Admitting: Family Medicine

## 2014-12-06 ENCOUNTER — Other Ambulatory Visit: Payer: Self-pay | Admitting: Family Medicine

## 2014-12-29 ENCOUNTER — Other Ambulatory Visit: Payer: Self-pay

## 2014-12-29 MED ORDER — FLUTICASONE PROPIONATE 50 MCG/ACT NA SUSP: 2.0000 | Freq: Every day | NASAL | Status: DC

## 2015-01-11 ENCOUNTER — Other Ambulatory Visit: Payer: Self-pay | Admitting: Family Medicine

## 2015-03-17 ENCOUNTER — Other Ambulatory Visit: Payer: Self-pay | Admitting: Family Medicine

## 2015-04-03 ENCOUNTER — Other Ambulatory Visit: Payer: Self-pay | Admitting: Family Medicine

## 2015-04-14 LAB — COMPREHENSIVE METABOLIC PANEL
ALK PHOS: 89 U/L (ref 39–117)
ALT: 29 U/L (ref 0–35)
AST: 25 U/L (ref 0–37)
Albumin: 3.9 g/dL (ref 3.5–5.2)
BUN: 13 mg/dL (ref 6–23)
CO2: 31 mEq/L (ref 19–32)
CREATININE: 0.65 mg/dL (ref 0.50–1.10)
Calcium: 9.1 mg/dL (ref 8.4–10.5)
Chloride: 99 mEq/L (ref 96–112)
Glucose, Bld: 79 mg/dL (ref 70–99)
Potassium: 3.5 mEq/L (ref 3.5–5.3)
SODIUM: 143 meq/L (ref 135–145)
TOTAL PROTEIN: 6.9 g/dL (ref 6.0–8.3)
Total Bilirubin: 0.6 mg/dL (ref 0.2–1.2)

## 2015-04-14 LAB — CBC
HCT: 38.3 % (ref 36.0–46.0)
Hemoglobin: 12.2 g/dL (ref 12.0–15.0)
MCH: 28.8 pg (ref 26.0–34.0)
MCHC: 31.9 g/dL (ref 30.0–36.0)
MCV: 90.5 fL (ref 78.0–100.0)
MPV: 9.8 fL (ref 8.6–12.4)
Platelets: 233 10*3/uL (ref 150–400)
RBC: 4.23 MIL/uL (ref 3.87–5.11)
RDW: 13.6 % (ref 11.5–15.5)
WBC: 3.9 10*3/uL — ABNORMAL LOW (ref 4.0–10.5)

## 2015-04-14 LAB — LIPID PANEL
CHOLESTEROL: 148 mg/dL (ref 0–200)
HDL: 46 mg/dL (ref >=46)
LDL Cholesterol: 89 mg/dL (ref 0–99)
Total CHOL/HDL Ratio: 3.2 Ratio
Triglycerides: 66 mg/dL (ref <150)
VLDL: 13 mg/dL (ref 0–40)

## 2015-04-14 LAB — TSH: TSH: 1.076 u[IU]/mL (ref 0.350–4.500)

## 2015-04-15 LAB — VITAMIN D 25 HYDROXY (VIT D DEFICIENCY, FRACTURES): VIT D 25 HYDROXY: 17 ng/mL — AB (ref 30–100)

## 2015-04-19 ENCOUNTER — Encounter: Payer: Self-pay | Admitting: Family Medicine

## 2015-04-19 ENCOUNTER — Ambulatory Visit (INDEPENDENT_AMBULATORY_CARE_PROVIDER_SITE_OTHER): Payer: BLUE CROSS/BLUE SHIELD | Admitting: Family Medicine

## 2015-04-19 VITALS — BP 116/70 | HR 95 | Resp 18 | Ht 72.0 in | Wt 223.1 lb

## 2015-04-19 DIAGNOSIS — J3089 Other allergic rhinitis: Secondary | ICD-10-CM | POA: Diagnosis not present

## 2015-04-19 DIAGNOSIS — E785 Hyperlipidemia, unspecified: Secondary | ICD-10-CM | POA: Diagnosis not present

## 2015-04-19 DIAGNOSIS — Z1211 Encounter for screening for malignant neoplasm of colon: Secondary | ICD-10-CM | POA: Diagnosis not present

## 2015-04-19 DIAGNOSIS — I10 Essential (primary) hypertension: Secondary | ICD-10-CM | POA: Diagnosis not present

## 2015-04-19 DIAGNOSIS — B009 Herpesviral infection, unspecified: Secondary | ICD-10-CM

## 2015-04-19 DIAGNOSIS — E669 Obesity, unspecified: Secondary | ICD-10-CM

## 2015-04-19 LAB — POC HEMOCCULT BLD/STL (OFFICE/1-CARD/DIAGNOSTIC): Fecal Occult Blood, POC: NEGATIVE

## 2015-04-19 MED ORDER — LORATADINE 10 MG PO TBDP: 10.0000 mg | ORAL_TABLET | Freq: Every day | ORAL | Status: DC

## 2015-04-19 NOTE — Patient Instructions (Addendum)
F/u in 5  month, call if you need me before  Pls call/ come for flu vaccine in the Fall  It is important that you exercise regularly at least 30 minutes 5 times a week. If you develop chest pain, have severe difficulty breathing, or feel very tired, stop exercising immediately and seek medical attention   Weight loss goal of 10 pounds  It is important that you exercise regularly at least 30 minutes 7 times a week. If you develop chest pain, have severe difficulty breathing, or feel very tired, stop exercising immediately and seek medical attention   Start vit D 3 800 IU one daily   Fasting lipid, cmp and EGFR in Jan

## 2015-04-25 NOTE — Assessment & Plan Note (Signed)
No palpable mass and heme negative stool 

## 2015-04-25 NOTE — Assessment & Plan Note (Signed)
Controlled, no change in medication Hyperlipidemia:Low fat diet discussed and encouraged.   Lipid Panel  Lab Results  Component Value Date   CHOL 148 04/14/2015   HDL 46 04/14/2015   LDLCALC 89 04/14/2015   TRIG 66 04/14/2015   CHOLHDL 3.2 04/14/2015

## 2015-04-25 NOTE — Assessment & Plan Note (Signed)
Controlled, no change in medication DASH diet and commitment to daily physical activity for a minimum of 30 minutes discussed and encouraged, as a part of hypertension management. The importance of attaining a healthy weight is also discussed.  BP/Weight 04/19/2015 10/26/2014 04/06/2014 11/04/2013 05/07/2013 11/19/1550 0/05/222  Systolic BP 361 224 497 530 051 102 111  Diastolic BP 70 78 78 68 84 90 87  Wt. (Lbs) 223.08 220.12 219 219.04 222.4 222.8 220  BMI 30.25 29.85 31.42 31.43 31.91 31.97 32.47

## 2015-04-25 NOTE — Progress Notes (Signed)
Subjective:    Patient ID: Allison Hart, female    DOB: November 25, 1960, 54 y.o.   MRN: [id number]  HPI The PT is here for follow up and re-evaluation of chronic medical conditions, medication management and review of any available recent lab and radiology data.  Preventive health is updated, specifically  Cancer screening and Immunization.   Questions or concerns regarding consultations or procedures which the PT has had in the interim are  addressed. The PT denies any adverse reactions to current medications since the last visit.  There are no new concerns.  There are no specific complaints       Review of Systems See HPI Denies recent fever or chills. Denies sinus pressure, nasal congestion, ear pain or sore throat. Denies chest congestion, productive cough or wheezing. Denies chest pains, palpitations and leg swelling Denies abdominal pain, nausea, vomiting,diarrhea or constipation.   Denies dysuria, frequency, hesitancy or incontinence. Denies joint pain, swelling and limitation in mobility. Denies headaches, seizures, numbness, or tingling. Denies depression, anxiety or insomnia. Denies skin break down or rash.        Objective:   Physical Exam BP 116/70 mmHg  Pulse 95  Resp 18  Ht 6' (1.829 m)  Wt 223 lb 1.3 oz (101.188 kg)  BMI 30.25 kg/m2  SpO2 90% Patient alert and oriented and in no cardiopulmonary distress.  HEENT: No facial asymmetry, EOMI,   oropharynx pink and moist.  Neck supple no JVD, no mass.  Chest: Clear to auscultation bilaterally.  CVS: S1, S2 no murmurs, no S3.Regular rate.  ABD: Soft non tender. No organomegaly or mass Rectal: no mass, good sphincter tone, heme negative stool  Ext: No edema  MS: Adequate ROM spine, shoulders, hips and knees.  Skin: Intact, no ulcerations or rash noted.  Psych: Good eye contact, normal affect. Memory intact not anxious or depressed appearing.  CNS: CN 2-12 intact, power,  normal throughout.no focal  deficits noted.        Assessment & Plan:  Essential hypertension Controlled, no change in medication DASH diet and commitment to daily physical activity for a minimum of 30 minutes discussed and encouraged, as a part of hypertension management. The importance of attaining a healthy weight is also discussed.  BP/Weight 04/19/2015 10/26/2014 04/06/2014 11/04/2013 05/07/2013 4/69/5072 11/14/7503  Systolic BP 183 358 251 898 421 031 281  Diastolic BP 70 78 78 68 84 90 87  Wt. (Lbs) 223.08 220.12 219 219.04 222.4 222.8 220  BMI 30.25 29.85 31.42 31.43 31.91 31.97 32.47        Obesity (BMI 30-39.9) Deteriorated. Patient re-educated about  the importance of commitment to a  minimum of 150 minutes of exercise per week.  The importance of healthy food choices with portion control discussed. Encouraged to start a food diary, count calories and to consider  joining a support group. Sample diet sheets offered. Goals set by the patient for the next several months.   Weight /BMI 04/19/2015 10/26/2014 04/06/2014  WEIGHT 223 lb 1.3 oz 220 lb 1.9 oz 219 lb  HEIGHT 6\' 0"  6\' 0"  5\' 10"   BMI 30.25 kg/m2 29.85 kg/m2 31.42 kg/m2    Current exercise per week 90 minutes.   Hyperlipidemia LDL goal <100 Controlled, no change in medication Hyperlipidemia:Low fat diet discussed and encouraged.   Lipid Panel  Lab Results  Component Value Date   CHOL 148 04/14/2015   HDL 46 04/14/2015   LDLCALC 89 04/14/2015   TRIG 66 04/14/2015   CHOLHDL  3.2 04/14/2015        Allergic rhinitis Controlled, no change in medication   Western blot positive HSV2 Uses medication as needed only  Special screening for malignant neoplasms, colon No palpable mass and heme negative stool

## 2015-04-25 NOTE — Assessment & Plan Note (Signed)
Controlled, no change in medication  

## 2015-04-25 NOTE — Assessment & Plan Note (Signed)
Deteriorated. Patient re-educated about  the importance of commitment to a  minimum of 150 minutes of exercise per week.  The importance of healthy food choices with portion control discussed. Encouraged to start a food diary, count calories and to consider  joining a support group. Sample diet sheets offered. Goals set by the patient for the next several months.   Weight /BMI 04/19/2015 10/26/2014 04/06/2014  WEIGHT 223 lb 1.3 oz 220 lb 1.9 oz 219 lb  HEIGHT 6\' 0"  6\' 0"  5\' 10"   BMI 30.25 kg/m2 29.85 kg/m2 31.42 kg/m2    Current exercise per week 90 minutes.

## 2015-04-25 NOTE — Assessment & Plan Note (Signed)
Uses medication as needed only

## 2015-05-01 ENCOUNTER — Other Ambulatory Visit: Payer: Self-pay | Admitting: Family Medicine

## 2015-07-08 ENCOUNTER — Other Ambulatory Visit: Payer: Self-pay | Admitting: Family Medicine

## 2015-08-05 ENCOUNTER — Other Ambulatory Visit: Payer: Self-pay | Admitting: Family Medicine

## 2015-09-27 ENCOUNTER — Ambulatory Visit: Payer: BLUE CROSS/BLUE SHIELD | Admitting: Family Medicine

## 2015-10-12 ENCOUNTER — Other Ambulatory Visit: Payer: Self-pay

## 2015-10-12 ENCOUNTER — Telehealth: Payer: Self-pay | Admitting: Family Medicine

## 2015-10-12 DIAGNOSIS — I10 Essential (primary) hypertension: Secondary | ICD-10-CM

## 2015-10-12 DIAGNOSIS — Z1231 Encounter for screening mammogram for malignant neoplasm of breast: Secondary | ICD-10-CM

## 2015-10-12 DIAGNOSIS — E785 Hyperlipidemia, unspecified: Secondary | ICD-10-CM

## 2015-10-12 NOTE — Telephone Encounter (Signed)
Patient is asking if she can get her lab order mailed to her, please advise?

## 2015-10-12 NOTE — Telephone Encounter (Signed)
Labs ordered and mailed as requested.

## 2015-10-16 ENCOUNTER — Other Ambulatory Visit: Payer: Self-pay | Admitting: Family Medicine

## 2015-11-02 LAB — LIPID PANEL
CHOL/HDL RATIO: 3.3 ratio (ref <=5.0)
CHOLESTEROL: 167 mg/dL (ref 125–200)
HDL: 51 mg/dL (ref >=46)
LDL Cholesterol: 103 mg/dL (ref <130)
Triglycerides: 63 mg/dL (ref <150)
VLDL: 13 mg/dL (ref <30)

## 2015-11-02 LAB — COMPLETE METABOLIC PANEL WITH GFR
ALBUMIN: 4.1 g/dL (ref 3.6–5.1)
ALT: 32 U/L — ABNORMAL HIGH (ref 6–29)
AST: 32 U/L (ref 10–35)
Alkaline Phosphatase: 97 U/L (ref 33–130)
BUN: 10 mg/dL (ref 7–25)
CALCIUM: 9.5 mg/dL (ref 8.6–10.4)
CO2: 38 mmol/L — AB (ref 20–31)
CREATININE: 0.69 mg/dL (ref 0.50–1.05)
Chloride: 97 mmol/L — ABNORMAL LOW (ref 98–110)
GFR, Est African American: >89 mL/min (ref >=60)
GLUCOSE: 90 mg/dL (ref 65–99)
POTASSIUM: 3.5 mmol/L (ref 3.5–5.3)
Sodium: 140 mmol/L (ref 135–146)
Total Bilirubin: 0.7 mg/dL (ref 0.2–1.2)
Total Protein: 7.3 g/dL (ref 6.1–8.1)

## 2015-11-05 ENCOUNTER — Other Ambulatory Visit: Payer: Self-pay | Admitting: Family Medicine

## 2015-11-06 ENCOUNTER — Other Ambulatory Visit: Payer: Self-pay | Admitting: Family Medicine

## 2015-11-08 ENCOUNTER — Ambulatory Visit
Admission: RE | Admit: 2015-11-08 | Discharge: 2015-11-08 | Disposition: A | Discharge status: Home / Self Care | Payer: BLUE CROSS/BLUE SHIELD | Source: Ambulatory Visit

## 2015-11-08 DIAGNOSIS — Z1231 Encounter for screening mammogram for malignant neoplasm of breast: Secondary | ICD-10-CM

## 2015-11-09 ENCOUNTER — Ambulatory Visit (INDEPENDENT_AMBULATORY_CARE_PROVIDER_SITE_OTHER): Payer: BLUE CROSS/BLUE SHIELD | Admitting: Family Medicine

## 2015-11-09 ENCOUNTER — Encounter: Payer: Self-pay | Admitting: Family Medicine

## 2015-11-09 ENCOUNTER — Other Ambulatory Visit: Payer: Self-pay | Admitting: Family Medicine

## 2015-11-09 VITALS — BP 126/78 | HR 74 | Resp 18 | Ht 72.0 in | Wt 221.1 lb

## 2015-11-09 DIAGNOSIS — I10 Essential (primary) hypertension: Secondary | ICD-10-CM | POA: Diagnosis not present

## 2015-11-09 DIAGNOSIS — R599 Enlarged lymph nodes, unspecified: Secondary | ICD-10-CM

## 2015-11-09 DIAGNOSIS — E785 Hyperlipidemia, unspecified: Secondary | ICD-10-CM

## 2015-11-09 DIAGNOSIS — J3089 Other allergic rhinitis: Secondary | ICD-10-CM | POA: Diagnosis not present

## 2015-11-09 DIAGNOSIS — R42 Dizziness and giddiness: Secondary | ICD-10-CM | POA: Insufficient documentation

## 2015-11-09 DIAGNOSIS — Z23 Encounter for immunization: Secondary | ICD-10-CM

## 2015-11-09 DIAGNOSIS — E663 Overweight: Secondary | ICD-10-CM

## 2015-11-09 DIAGNOSIS — R591 Generalized enlarged lymph nodes: Secondary | ICD-10-CM

## 2015-11-09 DIAGNOSIS — R102 Pelvic and perineal pain: Secondary | ICD-10-CM

## 2015-11-09 DIAGNOSIS — R928 Other abnormal and inconclusive findings on diagnostic imaging of breast: Secondary | ICD-10-CM

## 2015-11-09 MED ORDER — AZELASTINE HCL 0.1 % NA SOLN: 2.0000 | Freq: Two times a day (BID) | NASAL | Status: DC

## 2015-11-09 NOTE — Progress Notes (Signed)
Subjective:    Patient ID: Allison Hart, female    DOB: Oct 20, 1960, 55 y.o.   MRN: [id number]  HPI   SADAE FLEETWOOD     MRN: [id number]      DOB: 10-10-1960   HPI Allison Hart is here for follow up and re-evaluation of chronic medical conditions, medication management and review of any available recent lab and radiology data.  Preventive health is updated, specifically  Cancer screening and Immunization.   Needs additional imaging of her breast, had mammogram one day ago, and [place] of both axillae recommended The PT denies any adverse reactions to current medications since the last visit.  C/o chronic sinus congestion and pressure and recurrent dizziness, wants ENT evaluation  C/o pelvic discomfort/ pain  ROS Denies recent fever or chills. Denies sinus pressure, nasal congestion, ear pain or sore throat. Denies chest congestion, productive cough or wheezing. Denies chest pains, palpitations and leg swelling Denies abdominal pain, nausea, vomiting,diarrhea or constipation.   Denies dysuria, frequency, hesitancy or incontinence. Denies joint pain, swelling and limitation in mobility. Denies headaches, seizures, numbness, or tingling. Denies depression, anxiety or insomnia. Denies skin break down or rash.   PE  BP 126/78 mmHg  Pulse 74  Resp 18  Ht 6' (1.829 m)  Wt 221 lb 1.9 oz (100.299 kg)  BMI 29.98 kg/m2  SpO2 98%  Patient alert and oriented and in no cardiopulmonary distress.  HEENT: No facial asymmetry, EOMI,   oropharynx pink and moist.  Neck supple no JVD, no mass. Erythema and edema of nasal mucosa, TM clear, No sinus tenderness Chest: Clear to auscultation bilaterally.  CVS: S1, S2 no murmurs, no S3.Regular rate.  ABD: Soft non tender.   Ext: No edema  MS: Adequate ROM spine, shoulders, hips and knees.  Skin: Intact, no ulcerations or rash noted.  Psych: Good eye contact, normal affect. Memory intact not anxious or depressed appearing.  CNS: CN 2-12  intact, power,  normal throughout.no focal deficits noted.   Assessment & Plan   Essential hypertension Controlled, no change in medication DASH diet and commitment to daily physical activity for a minimum of 30 minutes discussed and encouraged, as a part of hypertension management. The importance of attaining a healthy weight is also discussed.  BP/Weight 11/09/2015 04/19/2015 10/26/2014 04/06/2014 11/04/2013 05/07/2013 [id number]  Systolic BP 123XX123 [id number] 123XX123 [id number]  Diastolic BP 78 70 78 78 68 84 90  Wt. (Lbs) 221.12 223.08 220.12 219 219.04 222.4 222.8  BMI 29.98 30.25 29.85 31.42 31.43 31.91 31.97        Allergic rhinitis Uncontrolled with recurrent vertigo.  Add Astelin daily to current regime and pt encouraged to resume  Saline nasal flushes twice daily  Pelvic pain in female Pelvic pain increased past 3 to 6 weeks, past h/o ovarian cyst, denioes discharge or dyspareunia, refer for pelvic US  Vertigo Recurrent vertigo, increasing in frequency and severity, refer to ENT for further eval and management  Overweight (BMI 25.0-29.9) Unchanged. Patient re-educated about  the importance of commitment to a  minimum of 150 minutes of exercise per week.  The importance of healthy food choices with portion control discussed. Encouraged to start a food diary, count calories and to consider  joining a support group. Sample diet sheets offered. Goals set by the patient for the next several months.   Weight /BMI 11/09/2015 04/19/2015 10/26/2014  WEIGHT 221 lb 1.9 oz 223 lb 1.3 oz 220 lb 1.9 oz  HEIGHT 6'  0" 6\' 0"  6\' 0"   BMI 29.98 kg/m2 30.25 kg/m2 29.85 kg/m2    Current exercise per week 90 minutes.   Hyperlipidemia LDL goal <100 Hyperlipidemia:Low fat diet discussed and encouraged.   Lipid Panel  Lab Results  Component Value Date   CHOL 167 11/01/2015   HDL 51 11/01/2015   LDLCALC 103 11/01/2015   TRIG 63 11/01/2015   CHOLHDL 3.3 11/01/2015   Controlled, no change in  medication          Review of Systems     Objective:   Physical Exam        Assessment & Plan:

## 2015-11-09 NOTE — Patient Instructions (Signed)
Physical exam  2nd week in July call if you need me sooner please  Flu vaccinne today  You are referred to ENT for vertigo  New additional nose spray for allergies use the other 2 medications every day also  You are referred for pelvic US  Schedule appointment through breast center for [place] of axilla based on recent mammogram please  WOrK on cghanging eating habit and weight loss, liver enzyme slightly high  Fasting lipid, cmp and EGFr, CBc, TSH 04/10/2016 or after  Take daily multivitamin , your vit D level is low, I also recommend additional vit D3 800 IU daily

## 2015-11-10 ENCOUNTER — Other Ambulatory Visit: Payer: Self-pay | Admitting: Family Medicine

## 2015-11-10 DIAGNOSIS — R102 Pelvic and perineal pain: Secondary | ICD-10-CM

## 2015-11-11 ENCOUNTER — Encounter: Payer: Self-pay | Admitting: Family Medicine

## 2015-11-12 NOTE — Assessment & Plan Note (Signed)
Controlled, no change in medication DASH diet and commitment to daily physical activity for a minimum of 30 minutes discussed and encouraged, as a part of hypertension management. The importance of attaining a healthy weight is also discussed.  BP/Weight 11/09/2015 04/19/2015 10/26/2014 04/06/2014 11/04/2013 05/07/2013 [id number]  Systolic BP 123XX123 [id number] 123XX123 [id number]  Diastolic BP 78 70 78 78 68 84 90  Wt. (Lbs) 221.12 223.08 220.12 219 219.04 222.4 222.8  BMI 29.98 30.25 29.85 31.42 31.43 31.91 31.97

## 2015-11-12 NOTE — Assessment & Plan Note (Signed)
Recurrent vertigo, increasing in frequency and severity, refer to ENT for further eval and management

## 2015-11-12 NOTE — Assessment & Plan Note (Signed)
Unchanged. Patient re-educated about  the importance of commitment to a  minimum of 150 minutes of exercise per week.  The importance of healthy food choices with portion control discussed. Encouraged to start a food diary, count calories and to consider  joining a support group. Sample diet sheets offered. Goals set by the patient for the next several months.   Weight /BMI 11/09/2015 04/19/2015 10/26/2014  WEIGHT 221 lb 1.9 oz 223 lb 1.3 oz 220 lb 1.9 oz  HEIGHT 6\' 0"  6\' 0"  6\' 0"   BMI 29.98 kg/m2 30.25 kg/m2 29.85 kg/m2    Current exercise per week 90 minutes.

## 2015-11-12 NOTE — Assessment & Plan Note (Signed)
Pelvic pain increased past 3 to 6 weeks, past h/o ovarian cyst, denioes discharge or dyspareunia, refer for pelvic US

## 2015-11-12 NOTE — Assessment & Plan Note (Signed)
Uncontrolled with recurrent vertigo.  Add Astelin daily to current regime and pt encouraged to resume  Saline nasal flushes twice daily

## 2015-11-12 NOTE — Assessment & Plan Note (Signed)
Hyperlipidemia:Low fat diet discussed and encouraged.   Lipid Panel  Lab Results  Component Value Date   CHOL 167 11/01/2015   HDL 51 11/01/2015   LDLCALC 103 11/01/2015   TRIG 63 11/01/2015   CHOLHDL 3.3 11/01/2015   Controlled, no change in medication

## 2015-11-16 ENCOUNTER — Other Ambulatory Visit (HOSPITAL_COMMUNITY): Payer: BLUE CROSS/BLUE SHIELD

## 2015-11-16 ENCOUNTER — Ambulatory Visit (HOSPITAL_COMMUNITY): Admission: RE | Admit: 2015-11-16 | Payer: BLUE CROSS/BLUE SHIELD | Source: Ambulatory Visit

## 2015-11-16 ENCOUNTER — Inpatient Hospital Stay (HOSPITAL_COMMUNITY): Admission: RE | Admit: 2015-11-16 | Payer: BLUE CROSS/BLUE SHIELD | Source: Ambulatory Visit

## 2015-11-22 ENCOUNTER — Other Ambulatory Visit: Payer: Self-pay | Admitting: Family Medicine

## 2015-11-23 ENCOUNTER — Other Ambulatory Visit: Payer: Self-pay | Admitting: Family Medicine

## 2015-11-23 ENCOUNTER — Encounter (HOSPITAL_COMMUNITY): Payer: Self-pay

## 2015-11-23 ENCOUNTER — Ambulatory Visit (HOSPITAL_COMMUNITY)
Admission: RE | Admit: 2015-11-23 | Discharge: 2015-11-23 | Disposition: A | Discharge status: Home / Self Care | Payer: BLUE CROSS/BLUE SHIELD | Source: Ambulatory Visit | Attending: Family Medicine | Admitting: Family Medicine

## 2015-11-23 ENCOUNTER — Other Ambulatory Visit (HOSPITAL_COMMUNITY): Payer: Self-pay | Admitting: Diagnostic Radiology

## 2015-11-23 DIAGNOSIS — D259 Leiomyoma of uterus, unspecified: Secondary | ICD-10-CM | POA: Diagnosis not present

## 2015-11-23 DIAGNOSIS — R591 Generalized enlarged lymph nodes: Secondary | ICD-10-CM

## 2015-11-23 DIAGNOSIS — R590 Localized enlarged lymph nodes: Secondary | ICD-10-CM | POA: Diagnosis not present

## 2015-11-23 DIAGNOSIS — N83201 Unspecified ovarian cyst, right side: Secondary | ICD-10-CM | POA: Diagnosis not present

## 2015-11-23 DIAGNOSIS — R102 Pelvic and perineal pain: Secondary | ICD-10-CM | POA: Diagnosis not present

## 2015-11-23 DIAGNOSIS — R599 Enlarged lymph nodes, unspecified: Secondary | ICD-10-CM

## 2015-11-23 MED ORDER — LIDOCAINE HCL (PF) 1 % IJ SOLN
INTRAMUSCULAR | Status: AC
  Filled 2015-11-23: qty 10

## 2015-11-23 MED ORDER — LIDOCAINE-EPINEPHRINE (PF) 1 %-1:200000 IJ SOLN
INTRAMUSCULAR | Status: AC
  Filled 2015-11-23: qty 10

## 2015-11-25 ENCOUNTER — Encounter: Payer: Self-pay | Admitting: Family Medicine

## 2015-11-29 ENCOUNTER — Encounter: Payer: Self-pay | Admitting: Family Medicine

## 2015-11-30 ENCOUNTER — Other Ambulatory Visit: Payer: Self-pay | Admitting: Family Medicine

## 2015-11-30 DIAGNOSIS — R590 Localized enlarged lymph nodes: Secondary | ICD-10-CM

## 2015-12-08 ENCOUNTER — Encounter (HOSPITAL_COMMUNITY): Payer: Self-pay | Admitting: Hematology & Oncology

## 2015-12-08 ENCOUNTER — Encounter (HOSPITAL_COMMUNITY): Payer: BLUE CROSS/BLUE SHIELD | Attending: Hematology & Oncology | Admitting: Hematology & Oncology

## 2015-12-08 VITALS — BP 123/80 | HR 83 | Temp 98.9°F | Resp 18 | Wt 222.0 lb

## 2015-12-08 DIAGNOSIS — R599 Enlarged lymph nodes, unspecified: Secondary | ICD-10-CM

## 2015-12-08 DIAGNOSIS — R5382 Chronic fatigue, unspecified: Secondary | ICD-10-CM

## 2015-12-08 DIAGNOSIS — R591 Generalized enlarged lymph nodes: Secondary | ICD-10-CM | POA: Insufficient documentation

## 2015-12-08 DIAGNOSIS — R9389 Abnormal findings on diagnostic imaging of other specified body structures: Secondary | ICD-10-CM

## 2015-12-08 DIAGNOSIS — R938 Abnormal findings on diagnostic imaging of other specified body structures: Secondary | ICD-10-CM

## 2015-12-08 LAB — CBC WITH DIFFERENTIAL/PLATELET
BASOS ABS: 0 10*3/uL (ref 0.0–0.1)
Basophils Relative: 1 %
EOS ABS: 0.4 10*3/uL (ref 0.0–0.7)
Eosinophils Relative: 8 %
HCT: 38.5 % (ref 36.0–46.0)
HEMOGLOBIN: 12.3 g/dL (ref 12.0–15.0)
LYMPHS ABS: 1.5 10*3/uL (ref 0.7–4.0)
Lymphocytes Relative: 30 %
MCH: 28.9 pg (ref 26.0–34.0)
MCHC: 31.9 g/dL (ref 30.0–36.0)
MCV: 90.4 fL (ref 78.0–100.0)
Monocytes Absolute: 0.5 10*3/uL (ref 0.1–1.0)
Monocytes Relative: 10 %
NEUTROS PCT: 51 %
Neutro Abs: 2.5 10*3/uL (ref 1.7–7.7)
Platelets: 236 10*3/uL (ref 150–400)
RBC: 4.26 MIL/uL (ref 3.87–5.11)
RDW: 13.5 % (ref 11.5–15.5)
WBC: 4.9 10*3/uL (ref 4.0–10.5)

## 2015-12-08 LAB — SEDIMENTATION RATE: Sed Rate: 28 mm/hr — ABNORMAL HIGH (ref 0–22)

## 2015-12-08 NOTE — Patient Instructions (Addendum)
[place] at [facility] Discharge Instructions  RECOMMENDATIONS MADE BY THE CONSULTANT AND ANY TEST RESULTS WILL BE SENT TO YOUR REFERRING PHYSICIAN.    Exam and discussion by Dr Whitney Muse today This could just be viral, we are going to do some further investigation. Lab work today CT scans scheduled  Return to see the doctor in after CT scans  Please call the clinic if you have any questions or concerns     Thank you for choosing [place] at [facility] to provide your oncology and hematology care.  To afford each patient quality time with our provider, please arrive at least 15 minutes before your scheduled appointment time.   Beginning January 23rd 2017 lab work for the [facility] will be done in the  Main lab at [facility] on 1st floor. If you have a lab appointment with the [place] please come in thru the  Main Entrance and check in at the main information desk  You need to re-schedule your appointment should you arrive 10 or more minutes late.  We strive to give you quality time with our providers, and arriving late affects you and other patients whose appointments are after yours.  Also, if you no show three or more times for appointments you may be dismissed from the clinic at the providers discretion.     Again, thank you for choosing [facility].  Our hope is that these requests will decrease the amount of time that you wait before being seen by our physicians.       _____________________________________________________________  Should you have questions after your visit to [facility], please contact our office at [phone] between the hours of 8:30 a.m. and 4:30 p.m.  Voicemails left after 4:30 p.m. will not be returned until the following business day.  For prescription refill requests, have your pharmacy contact our office.

## 2015-12-08 NOTE — Progress Notes (Signed)
[place] at [place] NOTE  Patient Care Team: Fayrene Helper, MD as PCP - General Izora Gala, MD as Consulting Physician (Otolaryngology)  CHIEF COMPLAINTS/PURPOSE OF CONSULTATION:  Lymphadenopathy Screening mammogram on 11/09/2015 with lymphadenopathy in the L axillae Bilateral breast U/S showing bilateral axillary adenopathy Pathology from Left LN biopsy (needle/core biopsy) on 11/23/2015 with normal flow cytometry, lymphoid hyperplasia  HISTORY OF PRESENTING ILLNESS:  Allison Hart 55 y.o. female is here because of referral by Dr. Moshe Cipro for abnormal lymph nodes found on screening mammogram.  This is the first year anything abnormal was found on her screening mammogram.  She has began feeling tired, attributing it to the stress of recent doctor's visits. 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  Her appetite is good. Denies drenching night sweats, though she gets hot easily. She has chronic sinus problems. Denies chest pain or shortness of breath. She does notice heart palpitations, stating "I move slow". She has been previously told she had a murmur. She has never had an echocardiogram. She experiences leg swelling that is worse at the end of the day than the morning. When asked about her bowel habits, states "I've never been regular", this is unchanged. She denies cough, fever, chills, weight loss, nausea or vomiting.   Denies family history of chronic leukemia or lymphomas.   She notes no significant change in her baseline PS or overall well being.   MEDICAL HISTORY:  Past Medical History  Diagnosis Date  . GERD (gastroesophageal reflux disease)   . Essential hypertension, benign   . Obesity   . Murmur     SURGICAL HISTORY: Past Surgical History  Procedure Laterality Date  . Umbilical hernia repair  infancy   . Left  inguinal hernia repair and endometriosis  2007    No definitive records   . Left parotid bx benign  2005  . Embolization of uterine artery x 2    . Colonoscopy  11/13/2012    Procedure: COLONOSCOPY;  Surgeon: Rogene Houston, MD;  Location: [facility];  Service: Endoscopy;  Laterality: N/A;  930    SOCIAL HISTORY: Social History   Social History  . Marital Status: Married    Spouse Name: N/A  . Number of Children: N/A  . Years of Education: N/A   Occupational History  . Not on file.   Social History Main Topics  . Smoking status: Former Smoker -- 0.25 packs/day for 5 years    Types: Cigarettes  . Smokeless tobacco: Never Used  . Alcohol Use: Yes     Comment: occasionally  . Drug Use: No  . Sexual Activity: Not on file   Other Topics Concern  . Not on file   Social History Narrative   Married 55 years Son Previously smoked socially.  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  FAMILY HISTORY: Family History  Problem Relation Age of Onset  . Hypertension Mother   . Hyperlipidemia Mother   . Thyroid disease Mother   . Coronary artery disease Mother   . Irregular heart beat Mother   . Fibroids Sister   . Hypertension Sister   . Ulcerative colitis Son   . Colon cancer Neg Hx    indicated that her mother is alive. She indicated that her father is alive. She indicated  that her sister is alive. She indicated that her brother is alive. She indicated that her son is alive.   Mother died from heart disease in January 29, 2009, in her early 60s. Father is 40 years-old with dementia. He has prostate cancer in his lumbar spine, otherwise healthy. 2 sisters, 1 brother. Brother has mild schizophrenia Younger sister has high blood pressure and A fib Other sister also has high blood pressure  ALLERGIES:  has No Known Allergies.  MEDICATIONS:  Current Outpatient Prescriptions  Medication Sig Dispense Refill  . acyclovir (ZOVIRAX) 800 MG  tablet Take 1 tablet (800 mg total) by mouth 2 (two) times daily. 10 tablet 5  . amLODipine (NORVASC) 5 MG tablet TAKE 1 TABLET BY MOUTH EVERY DAY 90 tablet 0  . atorvastatin (LIPITOR) 20 MG tablet TAKE 1 TABLET BY MOUTH EVERY DAY 90 tablet 1  . azelastine (ASTELIN) 0.1 % nasal spray Place 2 sprays into both nostrils 2 (two) times daily. Use in each nostril as directed 30 mL 12  . fluticasone (FLONASE) 50 MCG/ACT nasal spray PLACE 2 SPRAYS INTO BOTH NOSTRILS DAILY. 48 g 0  . loratadine (CLARITIN REDITABS) 10 MG dissolvable tablet Take 1 tablet (10 mg total) by mouth daily. 90 tablet 3  . omeprazole (PRILOSEC) 20 MG capsule TAKE ONE CAPSULE BY MOUTH EVERY DAY AS NEEDED 90 capsule 1  . triamterene-hydrochlorothiazide (MAXZIDE) 75-50 MG tablet TAKE 1 TABLET BY MOUTH EVERY DAY 90 tablet 1   No current facility-administered medications for this visit.    Review of Systems  Constitutional: Positive for malaise/fatigue. Negative for fever, chills, weight loss and diaphoresis.  HENT: Negative.  Negative for congestion, hearing loss, nosebleeds, sore throat and tinnitus.   Eyes: Negative.  Negative for blurred vision, double vision, pain and discharge.  Respiratory: Negative.  Negative for cough, hemoptysis, sputum production, shortness of breath and wheezing.   Cardiovascular: Positive for palpitations and leg swelling. Negative for chest pain, claudication and PND.  Gastrointestinal: Negative.  Negative for heartburn, nausea, vomiting, abdominal pain, diarrhea, constipation, blood in stool and melena.  Genitourinary: Negative.  Negative for dysuria, urgency, frequency and hematuria.  Musculoskeletal: Negative.  Negative for myalgias, joint pain and falls.  Skin: Negative.  Negative for itching and rash.  Neurological: Negative.  Negative for dizziness, tingling, tremors, sensory change, speech change, focal weakness, seizures, loss of consciousness, weakness and headaches.  Endo/Heme/Allergies:  Negative.  Does not bruise/bleed easily.       Chronic sinus problems.  Psychiatric/Behavioral: Negative.  Negative for depression, suicidal ideas, memory loss and substance abuse. The patient is not nervous/anxious and does not have insomnia.   All other systems reviewed and are negative.  14 point ROS was done and is otherwise as detailed above or in HPI   PHYSICAL EXAMINATION: ECOG PERFORMANCE STATUS: 0 - Asymptomatic  Filed Vitals:   12/08/15 1433  BP: 123/80  Pulse: 83  Temp: 98.9 F (37.2 C)  Resp: 18   Filed Weights   12/08/15 1433  Weight: 222 lb (100.699 kg)    Physical Exam  Constitutional: She is oriented to person, place, and time and well-developed, well-nourished, and in no distress.  HENT:  Head: Normocephalic and atraumatic.  Nose: Nose normal.  Mouth/Throat: Oropharynx is clear and moist. No oropharyngeal exudate.  Eyes: Conjunctivae and EOM are normal. Pupils are equal, round, and reactive to light. Right eye exhibits no discharge. Left eye exhibits no discharge. No scleral icterus.  Neck: Normal range of motion. Neck supple. No tracheal  deviation present. No thyromegaly present.  Cardiovascular: Normal rate and normal heart sounds.  Exam reveals no gallop and no friction rub.   No murmur heard. Occasional Cardiac ectopy  Pulmonary/Chest: Effort normal and breath sounds normal. She has no wheezes. She has no rales.  Abdominal: Soft. Bowel sounds are normal. She exhibits no distension and no mass. There is no tenderness. There is no rebound and no guarding.  Musculoskeletal: Normal range of motion. She exhibits no edema.  Lymphadenopathy:    She has no cervical adenopathy.    She has axillary adenopathy.       Right: Inguinal adenopathy present.       Left: Inguinal and supraclavicular adenopathy present.  .  Neurological: She is alert and oriented to person, place, and time. She has normal reflexes. No cranial nerve deficit. Gait normal. Coordination  normal.  Skin: Skin is warm and dry. No rash noted.  Psychiatric: Mood, memory, affect and judgment normal.  Nursing note and vitals reviewed.   LABORATORY DATA:  I have reviewed the data as listed Lab Results  Component Value Date   WBC 3.9* 04/14/2015   HGB 12.2 04/14/2015   HCT 38.3 04/14/2015   MCV 90.5 04/14/2015   PLT 233 04/14/2015   CMP     Component Value Date/Time   NA 140 11/01/2015 0739   K 3.5 11/01/2015 0739   CL 97* 11/01/2015 0739   CO2 38* 11/01/2015 0739   GLUCOSE 90 11/01/2015 0739   BUN 10 11/01/2015 0739   CREATININE 0.69 11/01/2015 0739   CREATININE 0.68 06/18/2010 1841   CALCIUM 9.5 11/01/2015 0739   PROT 7.3 11/01/2015 0739   ALBUMIN 4.1 11/01/2015 0739   AST 32 11/01/2015 0739   ALT 32* 11/01/2015 0739   ALKPHOS 97 11/01/2015 0739   BILITOT 0.7 11/01/2015 0739   GFRNONAA >89 11/01/2015 0739   GFRAA >89 11/01/2015 0739     RADIOGRAPHIC STUDIES: I have personally reviewed the radiological images as listed and agreed with the findings in the report.  CLINICAL DATA: Possible bilateral axillary lymphadenopathy identified on recent screening mammogram. In discussion with the patient today, she reports no chronic illnesses that would explain lymphadenopathy.  EXAM: ULTRASOUND OF THE BILATERAL BREAST  COMPARISON: Screening mammogram 11/08/2015  FINDINGS: On physical exam, no mass is palpated in either the left or right axilla.  Targeted ultrasound is performed, showing bilateral axillary lymph nodes with cortical thickening. Some of the lymph nodes have focal nodular cortical thickening, with intervening areas of normal cortical thickness, and there are some lymph nodes with diffuse cortical thickening and attenuation of the fatty hila. All of the imaged lymph nodes do have visible fatty hila. No hyperemia is seen in association with the lymph nodes. Below are some of the measurements obtained today.  In the right axilla: There  is a 1 cm short axis lymph node with cortical thickening up to 5 mm. There is a 6 mm short axis lymph node with a partially effaced fatty hilum and diffuse cortical thickening. 9 mm short axis lymph node with diffuse cortical thickening and loss of the normal echogenicity of the fatty hilum.  In the left axilla: There is a lymph node with cortical thickening focally up to 8 mm. The second axillary lymph node has normal cortical thickness on the superficial aspect and cortical thickening to 5 mm along its deep aspect.  IMPRESSION: Bilateral axillary lymphadenopathy with multiple lymph nodes identified with focal and/or diffuse cortical thickening. In discussion with  the patient today and upon review of her electronic medical records, no chronic medical condition is identified to readily explain this lymphadenopathy. Considerations include reactive lymphadenopathy, lymphoproliferative disorder, or metastatic disease.  RECOMMENDATION: Ultrasound-guided biopsy of one axillary lymph node is recommended. It is planned to biopsy a superficially positioned left axillary lymph node with diffuse cortical thickening. The patient would like to have the biopsy performed today. Dr. Griffin Dakin office is closed today. I will provide the order for biopsy. Biopsy will be dictated separately.  I have discussed the findings and recommendations with the patient. Results were also provided in writing at the conclusion of the visit. If applicable, a reminder letter will be sent to the patient regarding the next appointment.  BI-RADS CATEGORY 4: Suspicious.   Electronically Signed  By: Curlene Dolphin M.D.  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 Microscopic Comment: The sections show small needle core biopsy fragments of lymph nodal tissue displaying  preservation of the architecture associated with scattering of reactive appearing germinal centers. The paracortex mostly consists of small lymphoid cells associated with scattering of pigment consistent with dermatopathic changes. There is no morphologic evidence of a lymphoproliferative process and flow cytometry failed to show any monoclonal B-cell population of abnormal T-cell phenotype. There is no evidence of metastatic malignancy. Tissue-flow cytometry - no monoclonal B-cell population or abnormal T-cell phenotype identified. Concordant by Dr. Radford Pax. Recommendations: Clinical follow-up with Dr. Moshe Cipro. Screening mammogram in one year. At the patient's request, pathology and recommendations were relayed to the patient by phone. She stated she has done well following the biopsy with only soreness, but no bleeding, bruising, or hematoma. Post biopsy instructions were reviewed with the patient. All of her questions were answered. She was encouraged to call the imaging department of [facility] or [place], [facility]) with any further questions or concerns. Pathology and recommendations relayed by Jetta Lout, [place] on 11/25/15. Electronically Signed  By: Curlene Dolphin M.D.  On: 11/25/2015 13:04            ASSESSMENT & PLAN:  Lymphadenopathy Screening mammogram on 11/09/2015 with lymphadenopathy in the L axillae Bilateral breast U/S showing bilateral axillary adenopathy Pathology from Left LN biopsy (needle/core biopsy) on 11/23/2015 with normal flow cytometry, lymphoid hyperplasia  I reviewed the causes of lymphadenopathy with the patient in detail and explained to her that they include reactive, infectious, inflammatory and malignant. I have recommended obtaining multiple laboratory studies today as detailed below:  Orders Placed This Encounter  Procedures  . CT Chest W Contrast    Standing Status: Future     Number of Occurrences:       Standing Expiration Date: 12/07/2016    Order Specific Question:  If indicated for the ordered procedure, I authorize the administration of contrast media per Radiology protocol    Answer:  Yes    Order Specific Question:  Reason for Exam (SYMPTOM  OR DIAGNOSIS REQUIRED)    Answer:  diffuse LAD    Order Specific Question:  Is the patient pregnant?    Answer:  No    Order Specific Question:  Preferred imaging location?    Answer:  [facility]  . CT Abdomen Pelvis W Contrast    Standing Status: Future     Number of Occurrences:      Standing Expiration Date: 12/07/2016    Order Specific Question:  If indicated for  the ordered procedure, I authorize the administration of contrast media per Radiology protocol    Answer:  Yes    Order Specific Question:  Reason for Exam (SYMPTOM  OR DIAGNOSIS REQUIRED)    Answer:  diffuse LAD    Order Specific Question:  Is the patient pregnant?    Answer:  No    Order Specific Question:  Preferred imaging location?    Answer:  [facility]  . HIV antibody (with reflex)  . Hepatitis B surface antibody    Standing Status: Future     Number of Occurrences: 1     Standing Expiration Date: 12/07/2016  . Angiotensin converting enzyme  . Sedimentation rate  . Immunofixation electrophoresis    Standing Status: Future     Number of Occurrences: 1     Standing Expiration Date: 12/07/2016  . Protein electrophoresis, serum    Standing Status: Future     Number of Occurrences: 1     Standing Expiration Date: 12/07/2016  . CBC with Differential    Standing Status: Future     Number of Occurrences: 1     Standing Expiration Date: 12/07/2016  . Epstein-Barr virus VCA, IgM  . CMV IgM   I have also ordered CT imaging of the C/A/P. The patient is to return in approximately one week to review results.  I advised her today that we may have to proceed with formal excisional LN biopsy pending the results of all the above.  All questions were answered. The  patient knows to call the clinic with any problems, questions or concerns.  This document serves as a record of services personally performed by Ancil Linsey, MD. It was created on her behalf by Arlyce Harman, a trained medical scribe. The creation of this record is based on the scribe's personal observations and the provider's statements to them. This document has been checked and approved by the attending provider.  I have reviewed the above documentation for accuracy and completeness, and I agree with the above.  This note was electronically signed.    Molli Hazard, MD  12/08/2015 3:09 PM

## 2015-12-09 LAB — IMMUNOFIXATION ELECTROPHORESIS
IGG (IMMUNOGLOBIN G), SERUM: 1515 mg/dL (ref 700–1600)
IgA: 291 mg/dL (ref 87–352)
IgM, Serum: 157 mg/dL (ref 26–217)
Total Protein ELP: 7.1 g/dL (ref 6.0–8.5)

## 2015-12-09 LAB — ANGIOTENSIN CONVERTING ENZYME: ANGIOTENSIN-CONVERTING ENZYME: 48 U/L (ref 14–82)

## 2015-12-09 LAB — PROTEIN ELECTROPHORESIS, SERUM
A/G RATIO SPE: 1.2 (ref 0.7–1.7)
ALBUMIN ELP: 4 g/dL (ref 2.9–4.4)
ALPHA-1-GLOBULIN: 0.2 g/dL (ref 0.0–0.4)
Alpha-2-Globulin: 0.6 g/dL (ref 0.4–1.0)
Beta Globulin: 1 g/dL (ref 0.7–1.3)
GLOBULIN, TOTAL: 3.3 g/dL (ref 2.2–3.9)
Gamma Globulin: 1.5 g/dL (ref 0.4–1.8)
TOTAL PROTEIN ELP: 7.3 g/dL (ref 6.0–8.5)

## 2015-12-09 LAB — HIV ANTIBODY (ROUTINE TESTING W REFLEX): HIV SCREEN 4TH GENERATION: NONREACTIVE

## 2015-12-09 LAB — HEPATITIS B SURFACE ANTIBODY,QUALITATIVE: Hep B S Ab: NONREACTIVE

## 2015-12-09 LAB — CMV IGM

## 2015-12-09 LAB — EPSTEIN-BARR VIRUS VCA, IGM

## 2015-12-15 ENCOUNTER — Ambulatory Visit (HOSPITAL_COMMUNITY)
Admission: RE | Admit: 2015-12-15 | Discharge: 2015-12-15 | Disposition: A | Discharge status: Home / Self Care | Payer: BLUE CROSS/BLUE SHIELD | Source: Ambulatory Visit | Attending: Hematology & Oncology | Admitting: Hematology & Oncology

## 2015-12-15 DIAGNOSIS — R590 Localized enlarged lymph nodes: Secondary | ICD-10-CM | POA: Diagnosis not present

## 2015-12-15 DIAGNOSIS — R591 Generalized enlarged lymph nodes: Secondary | ICD-10-CM | POA: Diagnosis present

## 2015-12-15 DIAGNOSIS — D259 Leiomyoma of uterus, unspecified: Secondary | ICD-10-CM | POA: Insufficient documentation

## 2015-12-15 DIAGNOSIS — I722 Aneurysm of renal artery: Secondary | ICD-10-CM | POA: Insufficient documentation

## 2015-12-15 DIAGNOSIS — R599 Enlarged lymph nodes, unspecified: Secondary | ICD-10-CM

## 2015-12-15 MED ORDER — IOHEXOL 300 MG/ML  SOLN
100.0000 mL | Freq: Once | INTRAMUSCULAR | Status: AC | PRN
  Administered 2015-12-15: 100 mL via INTRAVENOUS

## 2015-12-16 ENCOUNTER — Encounter (HOSPITAL_COMMUNITY): Payer: Self-pay | Admitting: Hematology & Oncology

## 2015-12-16 ENCOUNTER — Encounter (HOSPITAL_COMMUNITY): Payer: Self-pay | Admitting: Lab

## 2015-12-16 ENCOUNTER — Encounter (HOSPITAL_BASED_OUTPATIENT_CLINIC_OR_DEPARTMENT_OTHER): Payer: BLUE CROSS/BLUE SHIELD | Admitting: Hematology & Oncology

## 2015-12-16 ENCOUNTER — Other Ambulatory Visit (HOSPITAL_COMMUNITY): Payer: Self-pay | Admitting: Hematology & Oncology

## 2015-12-16 VITALS — BP 134/79 | HR 84 | Temp 98.3°F | Resp 18 | Wt 224.8 lb

## 2015-12-16 DIAGNOSIS — R591 Generalized enlarged lymph nodes: Secondary | ICD-10-CM

## 2015-12-16 DIAGNOSIS — R938 Abnormal findings on diagnostic imaging of other specified body structures: Secondary | ICD-10-CM | POA: Diagnosis not present

## 2015-12-16 DIAGNOSIS — R9389 Abnormal findings on diagnostic imaging of other specified body structures: Secondary | ICD-10-CM

## 2015-12-16 DIAGNOSIS — I722 Aneurysm of renal artery: Secondary | ICD-10-CM

## 2015-12-16 NOTE — Progress Notes (Signed)
[facility] Note  Patient Care Team: Allison Helper, MD as PCP - General Allison Gala, MD as Consulting Physician (Otolaryngology)  CHIEF COMPLAINTS/PURPOSE OF CONSULTATION:  Lymphadenopathy Screening mammogram on 11/09/2015 with lymphadenopathy in the L axillae Bilateral breast U/S showing bilateral axillary adenopathy Pathology from Left LN biopsy (needle/core biopsy) on 11/23/2015 with normal flow cytometry, lymphoid hyperplasia  HISTORY OF PRESENTING ILLNESS:  Allison Hart 55 y.o. female is here because of referral by Dr. Moshe Hart for Lymphadenopathy found on mammography.   [name] was here alone today.   She had blood work done on 12/08/15, she underwent CT imaging. She is here today for review and additional recommendations.   She notes that she feels great with no complaints.  She sees Dr. Moshe Hart, her PCP, in July.   MEDICAL HISTORY:  Past Medical History  Diagnosis Date  . GERD (gastroesophageal reflux disease)   . Essential hypertension, benign   . Obesity   . Murmur     SURGICAL HISTORY: Past Surgical History  Procedure Laterality Date  . Umbilical hernia repair  infancy   . Left inguinal hernia repair and endometriosis  2006/01/14    No definitive records   . Left parotid bx benign  01-15-2004  . Embolization of uterine artery x 2    . Colonoscopy  11/13/2012    Procedure: COLONOSCOPY;  Surgeon: Allison Houston, MD;  Location: [facility];  Service: Endoscopy;  Laterality: N/A;  930    SOCIAL HISTORY: Social History   Social History  . Marital Status: Married    Spouse Name: N/A  . Number of Children: N/A  . Years of Education: N/A   Occupational History  . Not on file.   Social History Main Topics  . Smoking status: Former Smoker -- 0.25 packs/day for 5 years    Types: Cigarettes  . Smokeless tobacco: Never Used  . Alcohol Use: Yes     Comment: occasionally  . Drug Use: No  . Sexual Activity: Not on file     Other Topics Concern  . Not on file   Social History Narrative   Married 26 years Son Previously smoked socially.  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  FAMILY HISTORY: Family History  Problem Relation Age of Onset  . Hypertension Mother   . Hyperlipidemia Mother   . Thyroid disease Mother   . Coronary artery disease Mother   . Irregular heart beat Mother   . Fibroids Sister   . Hypertension Sister   . Ulcerative colitis Son   . Colon cancer Neg Hx    indicated that her mother is alive. She indicated that her father is alive. She indicated that her sister is alive. She indicated that her brother is alive. She indicated that her son is alive.   Mother died from heart disease in 01-14-2009, in her early 48s. Father is 79 years-old with dementia. He has prostate cancer in his lumbar spine, otherwise healthy. 2 sisters, 1 brother. Brother has mild schizophrenia Younger sister has high blood pressure and A fib Other sister also has high blood pressure  ALLERGIES:  has No Known Allergies.  MEDICATIONS:  Current Outpatient Prescriptions  Medication Sig Dispense Refill  . amLODipine (NORVASC) 5 MG tablet TAKE 1 TABLET BY MOUTH EVERY DAY 90 tablet 0  . atorvastatin (LIPITOR) 20 MG tablet TAKE 1 TABLET BY MOUTH EVERY DAY 90 tablet 1  .  azelastine (ASTELIN) 0.1 % nasal spray Place 2 sprays into both nostrils 2 (two) times daily. Use in each nostril as directed 30 mL 12  . fluticasone (FLONASE) 50 MCG/ACT nasal spray PLACE 2 SPRAYS INTO BOTH NOSTRILS DAILY. 48 g 0  . loratadine (CLARITIN REDITABS) 10 MG dissolvable tablet Take 1 tablet (10 mg total) by mouth daily. 90 tablet 3  . omeprazole (PRILOSEC) 20 MG capsule TAKE ONE CAPSULE BY MOUTH EVERY DAY AS NEEDED 90 capsule 1  . triamterene-hydrochlorothiazide (MAXZIDE) 75-50 MG tablet TAKE 1 TABLET BY MOUTH EVERY DAY 90 tablet 1  . acyclovir (ZOVIRAX) 800 MG tablet Take 1 tablet (800 mg  total) by mouth 2 (two) times daily. (Patient not taking: Reported on 12/16/2015) 10 tablet 5   No current facility-administered medications for this visit.    Review of Systems  Constitutional: Negative for fever, chills, weight loss, malaise/fatigue and diaphoresis.  HENT: Negative.  Negative for congestion, hearing loss, nosebleeds, sore throat and tinnitus.   Eyes: Negative.  Negative for blurred vision, double vision, pain and discharge.  Respiratory: Negative.  Negative for cough, hemoptysis, sputum production, shortness of breath and wheezing.   Cardiovascular: Negative for chest pain, palpitations, claudication, leg swelling and PND.  Gastrointestinal: Negative.  Negative for heartburn, nausea, vomiting, abdominal pain, diarrhea, constipation, blood in stool and melena.  Genitourinary: Negative.  Negative for dysuria, urgency, frequency and hematuria.  Musculoskeletal: Negative.  Negative for myalgias, joint pain and falls.  Skin: Negative.  Negative for itching and rash.  Neurological: Negative.  Negative for dizziness, tingling, tremors, sensory change, speech change, focal weakness, seizures, loss of consciousness, weakness and headaches.  Endo/Heme/Allergies: Negative.  Does not bruise/bleed easily.       Chronic sinus problems.  Psychiatric/Behavioral: Negative.  Negative for depression, suicidal ideas, memory loss and substance abuse. The patient is not nervous/anxious and does not have insomnia.   All other systems reviewed and are negative.  14 point ROS was done and is otherwise as detailed above or in HPI  PHYSICAL EXAMINATION: ECOG PERFORMANCE STATUS: 0 - Asymptomatic  Filed Vitals:   12/16/15 1221  BP: 134/79  Pulse: 84  Temp: 98.3 F (36.8 C)  Resp: 18   Filed Weights   12/16/15 1221  Weight: 224 lb 12.8 oz (101.969 kg)    Physical Exam  Constitutional: She is oriented to person, place, and time and well-developed, well-nourished, and in no distress.  HENT:   Head: Normocephalic and atraumatic.  Nose: Nose normal.  Mouth/Throat: Oropharynx is clear and moist. No oropharyngeal exudate.  Eyes: Conjunctivae and EOM are normal. Pupils are equal, round, and reactive to light. Right eye exhibits no discharge. Left eye exhibits no discharge. No scleral icterus.  Neck: Normal range of motion. Neck supple. No tracheal deviation present. No thyromegaly present.  Cardiovascular: Normal rate and normal heart sounds.  Exam reveals no gallop and no friction rub.   No murmur heard. Occasional Cardiac ectopy  Pulmonary/Chest: Effort normal and breath sounds normal. She has no wheezes. She has no rales.  Abdominal: Soft. Bowel sounds are normal. She exhibits no distension and no mass. There is no tenderness. There is no rebound and no guarding.  Musculoskeletal: Normal range of motion. She exhibits no edema.  Lymphadenopathy:    She has no cervical adenopathy.    She has axillary adenopathy.       Right: Inguinal adenopathy present.       Left: Inguinal and supraclavicular adenopathy present.  .  Neurological:  She is alert and oriented to person, place, and time. She has normal reflexes. No cranial nerve deficit. Gait normal. Coordination normal.  Skin: Skin is warm and dry. No rash noted.  Psychiatric: Mood, memory, affect and judgment normal.  Nursing note and vitals reviewed.  LABORATORY DATA:  I have reviewed the data as listed Results for JOSCLYN, WAAG (MRN [id number]) as of 12/16/2015 16:36  Ref. Range 12/08/2015 15:30  Comment Unknown Comment  Total Protein ELP Latest Ref Range: 6.0-8.5 g/dL 7.3  Albumin ELP Latest Ref Range: 2.9-4.4 g/dL 4.0  Globulin, Total Latest Ref Range: 2.2-3.9 g/dL 3.3  A/G Ratio Latest Ref Range: 0.7-1.7  1.2  Alpha-1-Globulin Latest Ref Range: 0.0-0.4 g/dL 0.2  Alpha-2-Globulin Latest Ref Range: 0.4-1.0 g/dL 0.6  Beta Globulin Latest Ref Range: 0.7-1.3 g/dL 1.0  Gamma Globulin Latest Ref Range: 0.4-1.8 g/dL 1.5    M-SPIKE, % Latest Ref Range: Not Observed g/dL Not Observed  SPE Interp. Unknown Comment  WBC Latest Ref Range: 4.0-10.5 K/uL 4.9  RBC Latest Ref Range: 3.87-5.11 MIL/uL 4.26  Hemoglobin Latest Ref Range: 12.0-15.0 g/dL 12.3  HCT Latest Ref Range: 36.0-46.0 % 38.5  MCV Latest Ref Range: 78.0-100.0 fL 90.4  MCH Latest Ref Range: 26.0-34.0 pg 28.9  MCHC Latest Ref Range: 30.0-36.0 g/dL 31.9  RDW Latest Ref Range: 11.5-15.5 % 13.5  Platelets Latest Ref Range: 150-400 K/uL 236  Neutrophils Latest Units: % 51  Lymphocytes Latest Units: % 30  Monocytes Relative Latest Units: % 10  Eosinophil Latest Units: % 8  Basophil Latest Units: % 1  NEUT# Latest Ref Range: 1.7-7.7 K/uL 2.5  Lymphocyte # Latest Ref Range: 0.7-4.0 K/uL 1.5  Monocyte # Latest Ref Range: 0.1-1.0 K/uL 0.5  Eosinophils Absolute Latest Ref Range: 0.0-0.7 K/uL 0.4  Basophils Absolute Latest Ref Range: 0.0-0.1 K/uL 0.0  Sed Rate Latest Ref Range: 0-22 mm/hr 28 (H)  Angiotensin-Converting Enzyme Latest Ref Range: 14-82 U/L 48  EBV VCA IgM Latest Ref Range: 0.0-35.9 U/mL <36.0  CMV IgM Latest Ref Range: 0.0-29.9 AU/mL <30.0  Hep B S Ab Unknown Non Reactive  HIV Latest Ref Range: Non Reactive  Non Reactive     RADIOGRAPHIC STUDIES: I have personally reviewed the radiological images as listed and agreed with the findings in the report. Study Result     CLINICAL DATA: Axillary lymphadenopathy.  EXAM: CT CHEST, ABDOMEN, AND PELVIS WITH CONTRAST  TECHNIQUE: Multidetector CT imaging of the chest, abdomen and pelvis was performed following the standard protocol during bolus administration of intravenous contrast.  CONTRAST: 122mL OMNIPAQUE IOHEXOL 300 MG/ML SOLN  COMPARISON: None.  FINDINGS: CT CHEST FINDINGS  Mediastinum/Lymph Nodes: Borderline to mild bilateral subpectoral and axillary lymphadenopathy is evident. Dominant lymph node in the right axilla measures 13 mm short axis. One of the dominant  lymph nodes in the left axilla measures 11 mm in short axis. No mediastinal lymphadenopathy. There is no hilar lymphadenopathy. The heart size is normal. No pericardial effusion. The esophagus has normal imaging features.  Lungs/Pleura: Lungs are clear without focal airspace consolidation or pulmonary edema. No pleural effusion. No suspicious pulmonary nodule or mass.  Musculoskeletal: Bone windows reveal no worrisome lytic or sclerotic osseous lesions.  CT ABDOMEN PELVIS FINDINGS  Hepatobiliary: 11 mm low-density lesion inferior right liver cannot be fully characterized but is likely a cyst. Gallbladder is unremarkable. No intrahepatic or extrahepatic biliary dilation.  Pancreas: No focal mass lesion. No dilatation of the main duct. No intraparenchymal cyst. No peripancreatic edema.  Spleen: No  splenomegaly. No focal mass lesion.  Adrenals/Urinary Tract: No adrenal nodule or mass. 13 mm water density lesion in the upper pole left kidney is compatible with a cyst. No evidence for enhancing lesion in either kidney. No evidence for hydroureter. The urinary bladder appears normal for the degree of distention.  Stomach/Bowel: Stomach is nondistended. No gastric wall thickening. No evidence of outlet obstruction. Duodenum is normally positioned as is the ligament of Treitz. No small bowel wall thickening. No small bowel dilatation. The terminal ileum is normal. The appendix is normal. No gross colonic mass. No colonic wall thickening. No substantial diverticular change.  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 No abdominal aortic aneurysm. There is no gastrohepatic or hepatoduodenal ligament lymphadenopathy. No intraperitoneal or retroperitoneal lymphadenopathy. No pelvic sidewall lymphadenopathy. One of the largest right groin lymph nodes is 8 mm in short axis with an obvious fatty hilum.  Reproductive: Calcified  fibroids are seen in the uterus including a left pedunculated fundal fibroid. There is no adnexal mass.  Other: No intraperitoneal free fluid.  Musculoskeletal: Bone windows reveal no worrisome lytic or sclerotic osseous lesions.  IMPRESSION: 1. Borderline to mildly enlarged lymph nodes in both subpectoral and axillary regions without clear etiology. 2. 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 Consultation with interventional radiology may prove helpful to assess treatment options. 3. Calcified fibroids. 4. Probable hepatic and renal cysts. These results will be called to the ordering clinician or representative by the Radiologist Assistant, and communication documented in the PACS or zVision Dashboard.   Electronically Signed  By: Misty Stanley M.D.  On: 12/15/2015 12:09   Study Result     CLINICAL DATA: Axillary lymphadenopathy.  EXAM: CT CHEST, ABDOMEN, AND PELVIS WITH CONTRAST  TECHNIQUE: Multidetector CT imaging of the chest, abdomen and pelvis was performed following the standard protocol during bolus administration of intravenous contrast.  CONTRAST: 157mL OMNIPAQUE IOHEXOL 300 MG/ML SOLN  COMPARISON: None.  FINDINGS: CT CHEST FINDINGS  Mediastinum/Lymph Nodes: Borderline to mild bilateral subpectoral and axillary lymphadenopathy is evident. Dominant lymph node in the right axilla measures 13 mm short axis. One of the dominant lymph nodes in the left axilla measures 11 mm in short axis. No mediastinal lymphadenopathy. There is no hilar lymphadenopathy. The heart size is normal. No pericardial effusion. The esophagus has normal imaging features.  Lungs/Pleura: Lungs are clear without focal airspace consolidation or pulmonary edema. No pleural effusion. No suspicious pulmonary nodule or mass.  Musculoskeletal: Bone windows reveal no worrisome lytic or sclerotic osseous lesions.  CT ABDOMEN PELVIS  FINDINGS  Hepatobiliary: 11 mm low-density lesion inferior right liver cannot be fully characterized but is likely a cyst. Gallbladder is unremarkable. No intrahepatic or extrahepatic biliary dilation.  Pancreas: No focal mass lesion. No dilatation of the main duct. No intraparenchymal cyst. No peripancreatic edema.  Spleen: No splenomegaly. No focal mass lesion.  Adrenals/Urinary Tract: No adrenal nodule or mass. 13 mm water density lesion in the upper pole left kidney is compatible with a cyst. No evidence for enhancing lesion in either kidney. No evidence for hydroureter. The urinary bladder appears normal for the degree of distention.  Stomach/Bowel: Stomach is nondistended. No gastric wall thickening. No evidence of outlet obstruction. Duodenum is normally positioned as is the ligament of Treitz. No small bowel wall thickening. No small bowel dilatation. The terminal ileum is normal. The appendix is normal. No gross colonic mass. No colonic wall thickening. No substantial diverticular change.  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 No abdominal aortic aneurysm. There is no gastrohepatic or hepatoduodenal ligament lymphadenopathy. No intraperitoneal or retroperitoneal lymphadenopathy. No pelvic sidewall lymphadenopathy. One of the largest right groin lymph nodes is 8 mm in short axis with an obvious fatty hilum.  Reproductive: Calcified fibroids are seen in the uterus including a left pedunculated fundal fibroid. There is no adnexal mass.  Other: No intraperitoneal free fluid.  Musculoskeletal: Bone windows reveal no worrisome lytic or sclerotic osseous lesions.  IMPRESSION: 1. Borderline to mildly enlarged lymph nodes in both subpectoral and axillary regions without clear etiology. 2. 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 Consultation with interventional radiology may prove helpful to assess  treatment options. 3. Calcified fibroids. 4. Probable hepatic and renal cysts. These results will be called to the ordering clinician or representative by the Radiologist Assistant, and communication documented in the PACS or zVision Dashboard.   Electronically Signed  By: Misty Stanley M.D.  On: 12/15/2015 12:09    CLINICAL DATA: Possible bilateral axillary lymphadenopathy identified on recent screening mammogram. In discussion with the patient today, she reports no chronic illnesses that would explain lymphadenopathy.  EXAM: ULTRASOUND OF THE BILATERAL BREAST  COMPARISON: Screening mammogram 11/08/2015  FINDINGS: On physical exam, no mass is palpated in either the left or right axilla.  Targeted ultrasound is performed, showing bilateral axillary lymph nodes with cortical thickening. Some of the lymph nodes have focal nodular cortical thickening, with intervening areas of normal cortical thickness, and there are some lymph nodes with diffuse cortical thickening and attenuation of the fatty hila. All of the imaged lymph nodes do have visible fatty hila. No hyperemia is seen in association with the lymph nodes. Below are some of the measurements obtained today.  In the right axilla: There is a 1 cm short axis lymph node with cortical thickening up to 5 mm. There is a 6 mm short axis lymph node with a partially effaced fatty hilum and diffuse cortical thickening. 9 mm short axis lymph node with diffuse cortical thickening and loss of the normal echogenicity of the fatty hilum.  In the left axilla: There is a lymph node with cortical thickening focally up to 8 mm. The second axillary lymph node has normal cortical thickness on the superficial aspect and cortical thickening to 5 mm along its deep aspect.  IMPRESSION: Bilateral axillary lymphadenopathy with multiple lymph nodes identified with focal and/or diffuse cortical thickening. In discussion with the  patient today and upon review of her electronic medical records, no chronic medical condition is identified to readily explain this lymphadenopathy. Considerations include reactive lymphadenopathy, lymphoproliferative disorder, or metastatic disease.  RECOMMENDATION: Ultrasound-guided biopsy of one axillary lymph node is recommended. It is planned to biopsy a superficially positioned left axillary lymph node with diffuse cortical thickening. The patient would like to have the biopsy performed today. Dr. Griffin Dakin office is closed today. I will provide the order for biopsy. Biopsy will be dictated separately.  I have discussed the findings and recommendations with the patient. Results were also provided in writing at the conclusion of the visit. If applicable, a reminder letter will be sent to the patient regarding the next appointment.  BI-RADS CATEGORY 4: Suspicious.   Electronically Signed  By: Curlene Dolphin M.D.  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 Microscopic Comment: The sections show small  needle core biopsy fragments of lymph nodal tissue displaying preservation of the architecture associated with scattering of reactive appearing germinal centers. The paracortex mostly consists of small lymphoid cells associated with scattering of pigment consistent with dermatopathic changes. There is no morphologic evidence of a lymphoproliferative process and flow cytometry failed to show any monoclonal B-cell population of abnormal T-cell phenotype. There is no evidence of metastatic malignancy. Tissue-flow cytometry - no monoclonal B-cell population or abnormal T-cell phenotype identified. Concordant by Dr. Radford Pax. Recommendations: Clinical follow-up with Dr. Moshe Hart. Screening mammogram in one year. At the patient's request, pathology and  recommendations were relayed to the patient by phone. She stated she has done well following the biopsy with only soreness, but no bleeding, bruising, or hematoma. Post biopsy instructions were reviewed with the patient. All of her questions were answered. She was encouraged to call the imaging department of [facility] or [place], [facility]) with any further questions or concerns. Pathology and recommendations relayed by Jetta Lout, [place] on 11/25/15. Electronically Signed  By: Curlene Dolphin M.D.  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 I have also reviewed laboratory studies with the patient. I have recommended follow-up imaging in several months, she would like to wait 3 months. I feel based upon her biopsy that this is not unreasonable.  I will see her back post. If persistent adenopathy or progression, another biopsy will be recommended at that point. She is asymptomatic.  For the incidental finding of the R renal artery saccular aneurysm I have referred her to interventional radiology for further recommendations.   She has ongoing follow-up with Dr. Moshe Hart in July.   Orders Placed This Encounter  Procedures  . CT Chest W Contrast    Amy, esigned, [employer], will get pac, nbs    Standing Status: Future     Number of Occurrences:      Standing Expiration Date: 12/15/2016    Order Specific Question:  If indicated for the ordered procedure, I authorize the administration of contrast media per Radiology protocol    Answer:  Yes    Order Specific Question:  Reason for Exam (SYMPTOM  OR  DIAGNOSIS REQUIRED)    Answer:  lymphadenopathy    Order Specific Question:  Is the patient pregnant?    Answer:  No    Order Specific Question:  Preferred imaging location?    Answer:  [facility]    All questions were answered. The patient knows to call the clinic with any problems, questions or concerns.  This document serves as a record of services personally performed by Ancil Linsey, MD. It was created on her behalf by Kandace Blitz, a trained medical scribe. The creation of this record is based on the scribe's personal observations and the provider's statements to them. This document has been checked and approved by the attending provider.  I have reviewed the above documentation for accuracy and completeness, and I agree with the above.  This note was electronically signed.    Molli Hazard, MD  12/16/2015 4:35 PM

## 2015-12-16 NOTE — Patient Instructions (Addendum)
[place] at [facility] Discharge Instructions  RECOMMENDATIONS MADE BY THE CONSULTANT AND ANY TEST RESULTS WILL BE SENT TO YOUR REFERRING PHYSICIAN.    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  To afford each patient quality time with our provider, please arrive at least 15 minutes before your scheduled appointment time.   Beginning January 23rd 2017 lab work for the [facility] will be done in the  Main lab at [facility] on 1st floor. If you have a lab appointment with the [facility] please come in thru the  Main Entrance and check in at the main information desk  You need to re-schedule your appointment should you arrive 10 or more minutes late.  We strive to give you quality time with our providers, and arriving late affects you and other patients whose appointments are after yours.  Also, if you no show three or more times for appointments you may be dismissed from the clinic at the providers discretion.     Again, thank you for choosing [facility]  Our hope is that these requests will decrease the amount of time that you wait before being seen by our physicians.       _____________________________________________________________  Should you have questions after your visit to [facility], please contact our office at [phone] between the hours of 8:30 a.m. and 4:30 p.m.  Voicemails left after 4:30 p.m. will not be returned until the following business day.  For prescription refill requests, have your  pharmacy contact our office.         Resources For Cancer Patients and their Caregivers ? [employer]: Can assist with transportation, wigs, general needs, runs Look Good Feel Better.        (628) 166-1525 ? Cancer Care: Provides financial assistance, online support groups, medication/co-pay assistance.  1-800-813-HOPE (380)161-1027) ? [facility] Assists [place] cancer patients and their families through emotional , educational and financial support.  201-630-2982 ? [place] DSS Where to apply for food stamps, Medicaid and utility assistance. 332-651-0273 ? [facility]: Transportation to medical appointments. 517-161-2236 ? Social Security Administration: May apply for disability if have a Stage IV cancer. 902-445-3956 509 721 8728 ? [employer], Disability and Transit Services: Assists with nutrition, care and transit needs. (571)347-5319

## 2015-12-16 NOTE — Progress Notes (Signed)
Referral sent to IR clinic.  Records faxed on 3/9.

## 2015-12-22 ENCOUNTER — Other Ambulatory Visit (HOSPITAL_COMMUNITY): Payer: Self-pay | Admitting: Interventional Radiology

## 2015-12-22 ENCOUNTER — Ambulatory Visit
Admission: RE | Admit: 2015-12-22 | Discharge: 2015-12-22 | Disposition: A | Discharge status: Home / Self Care | Payer: BLUE CROSS/BLUE SHIELD | Source: Ambulatory Visit | Attending: Hematology & Oncology | Admitting: Hematology & Oncology

## 2015-12-22 DIAGNOSIS — I722 Aneurysm of renal artery: Secondary | ICD-10-CM

## 2015-12-22 NOTE — Consult Note (Signed)
Chief Complaint: Patient was seen in consultation today for  Chief Complaint  Patient presents with  . Advice Only    Consult for Right Renal Artery Aneurysm     at the request of Penland,Shannon K  Referring Physician(s): Penland,Shannon K  History of Present Illness: Allison Hart is a 55 y.o. female who presents at the kind request of Dr. Ancil Linsey for am incidentally detected renal artery aneurysm.   Allison Hart was in her usual state of health when she was noted to have axillary lymphadenopathy on routine mammography. She underwent additional imaging of the chest abdomen and pelvis as well as ultrasound guided biopsy of the lymph nodes. Her biopsy was negative. 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  Evaluation is somewhat limited by the non-arterial phase timing of the CT scan.  Allison Hart denies any recent history of right flank pain, hematuria or dysuria. She denies hypotension or syncope. She does have a history of high blood pressure which is well controlled.  Family history is positive for acute mesenteric ischemia which led to her mother's death. She is not sure if an aneurysm, dissection or embolic phenomenon was at the cause of the bowel ischemia.  Past Medical History  Diagnosis Date  . GERD (gastroesophageal reflux disease)   . Essential hypertension, benign   . Obesity   . Murmur     Past Surgical History  Procedure Laterality Date  . Umbilical hernia repair  infancy   . Left inguinal hernia repair and endometriosis  2007    No definitive records   . Left parotid bx benign  2005  . Embolization of uterine artery x 2    . Colonoscopy  11/13/2012    Procedure: COLONOSCOPY;  Surgeon: Rogene Houston, MD;  Location: [facility];  Service: Endoscopy;  Laterality:  N/A;  930    Allergies: Review of patient's allergies indicates no known allergies.  Medications: Prior to Admission medications   Medication Sig Start Date End Date Taking? Authorizing Provider  amLODipine (NORVASC) 5 MG tablet TAKE 1 TABLET BY MOUTH EVERY DAY 10/18/15  Yes Fayrene Helper, MD  atorvastatin (LIPITOR) 20 MG tablet TAKE 1 TABLET BY MOUTH EVERY DAY 03/17/15  Yes Fayrene Helper, MD  azelastine (ASTELIN) 0.1 % nasal spray Place 2 sprays into both nostrils 2 (two) times daily. Use in each nostril as directed 11/09/15  Yes Fayrene Helper, MD  fluticasone (FLONASE) 50 MCG/ACT nasal spray PLACE 2 SPRAYS INTO BOTH NOSTRILS DAILY. 11/08/15  Yes Fayrene Helper, MD  loratadine (CLARITIN REDITABS) 10 MG dissolvable tablet Take 1 tablet (10 mg total) by mouth daily. 04/19/15  Yes Fayrene Helper, MD  triamterene-hydrochlorothiazide (MAXZIDE) 75-50 MG tablet TAKE 1 TABLET BY MOUTH EVERY DAY 11/05/15  Yes Fayrene Helper, MD  acyclovir (ZOVIRAX) 800 MG tablet Take 1 tablet (800 mg total) by mouth 2 (two) times daily. Patient not taking: Reported on 12/22/2015 10/26/14   Fayrene Helper, MD  omeprazole (PRILOSEC) 20 MG capsule TAKE ONE CAPSULE BY MOUTH EVERY DAY AS NEEDED Patient not taking: Reported on 12/22/2015 11/23/15   Fayrene Helper, MD     Family History  Problem Relation Age of Onset  . Hypertension Mother   . Hyperlipidemia Mother   . Thyroid disease Mother   .  Coronary artery disease Mother   . Irregular heart beat Mother   . Fibroids Sister   . Hypertension Sister   . Ulcerative colitis Son   . Colon cancer Neg Hx     Social History   Social History  . Marital Status: Married    Spouse Name: N/A  . Number of Children: N/A  . Years of Education: N/A   Social History Main Topics  . Smoking status: Former Smoker -- 0.25 packs/day for 5 years    Types: Cigarettes  . Smokeless tobacco: Never Used  . Alcohol Use: Yes     Comment: occasionally  .  Drug Use: No  . Sexual Activity: Not on file   Other Topics Concern  . Not on file   Social History Narrative   Review of Systems: A 12 point ROS discussed and pertinent positives are indicated in the HPI above.  All other systems are negative.  Review of Systems  Vital Signs: BP 127/79 mmHg  Pulse 88  Temp(Src) 98.2 F (36.8 C) (Oral)  Resp 15  Ht _0  (1.753 m)  Wt 220 lb (99.791 kg)  BMI 32.47 kg/m2  SpO2 95%  Physical Exam  Constitutional: She is oriented to person, place, and time. She appears well-developed and well-nourished.  HENT:  Head: Normocephalic and atraumatic.  Eyes: No scleral icterus.  Cardiovascular: Normal rate and regular rhythm.   Pulmonary/Chest: Effort normal.  Abdominal: Soft.  Neurological: She is alert and oriented to person, place, and time.  Skin: Skin is warm and dry.  Psychiatric: She has a normal mood and affect. Her behavior is normal.  Nursing note and vitals reviewed.   Mallampati Score:     Imaging: Ct Chest W Contrast  12/15/2015  CLINICAL DATA:  Axillary lymphadenopathy. EXAM: CT CHEST, ABDOMEN, AND PELVIS WITH CONTRAST TECHNIQUE: Multidetector CT imaging of the chest, abdomen and pelvis was performed following the standard protocol during bolus administration of intravenous contrast. CONTRAST:  151m OMNIPAQUE IOHEXOL 300 MG/ML  SOLN COMPARISON:  None. FINDINGS: CT CHEST FINDINGS Mediastinum/Lymph Nodes: Borderline to mild bilateral subpectoral and axillary lymphadenopathy is evident. Dominant lymph node in the right axilla measures 13 mm short axis. One of the dominant lymph nodes in the left axilla measures 11 mm in short axis. No mediastinal lymphadenopathy. There is no hilar lymphadenopathy. The heart size is normal. No pericardial effusion. The esophagus has normal imaging features. Lungs/Pleura: Lungs are clear without focal airspace consolidation or pulmonary edema. No pleural effusion. No suspicious pulmonary nodule or mass.  Musculoskeletal: Bone windows reveal no worrisome lytic or sclerotic osseous lesions. CT ABDOMEN PELVIS FINDINGS Hepatobiliary: 11 mm low-density lesion inferior right liver cannot be fully characterized but is likely a cyst. Gallbladder is unremarkable. No intrahepatic or extrahepatic biliary dilation. Pancreas: No focal mass lesion. No dilatation of the main duct. No intraparenchymal cyst. No peripancreatic edema. Spleen: No splenomegaly. No focal mass lesion. Adrenals/Urinary Tract: No adrenal nodule or mass. 13 mm water density lesion in the upper pole left kidney is compatible with a cyst. No evidence for enhancing lesion in either kidney. No evidence for hydroureter. The urinary bladder appears normal for the degree of distention. Stomach/Bowel: Stomach is nondistended. No gastric wall thickening. No evidence of outlet obstruction. Duodenum is normally positioned as is the ligament of Treitz. No small bowel wall thickening. No small bowel dilatation. The terminal ileum is normal. The appendix is normal. No gross colonic mass. No colonic wall thickening. No substantial diverticular change. 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 No abdominal aortic aneurysm. There is no gastrohepatic or hepatoduodenal ligament lymphadenopathy. No intraperitoneal or retroperitoneal lymphadenopathy. No pelvic sidewall lymphadenopathy. One of the largest right groin lymph nodes is 8 mm in short axis with an obvious fatty hilum. Reproductive: Calcified fibroids are seen in the uterus including a left pedunculated fundal fibroid. There is no adnexal mass. Other: No intraperitoneal free fluid. Musculoskeletal: Bone windows reveal no worrisome lytic or sclerotic osseous lesions. IMPRESSION: 1. Borderline to mildly enlarged lymph nodes in both subpectoral and axillary regions without clear etiology. 2. 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 Consultation with interventional  radiology may prove helpful to assess treatment options. 3. Calcified fibroids. 4. Probable hepatic and renal cysts. These results will be called to the ordering clinician or representative by the Radiologist Assistant, and communication documented in the PACS or zVision Dashboard. Electronically Signed   By: Misty Stanley M.D.   On: 12/15/2015 12:09   US Transvaginal Non-ob  11/23/2015  CLINICAL DATA:  Intermittent pelvic pain for 3-6 weeks. Remote history of uterine artery embolization and fibroids EXAM: TRANSABDOMINAL AND TRANSVAGINAL ULTRASOUND OF PELVIS TECHNIQUE: Both transabdominal and transvaginal ultrasound examinations of the pelvis were performed. Transabdominal technique was performed for global imaging of the pelvis including uterus, ovaries, adnexal regions, and pelvic cul-de-sac. It was necessary to proceed with endovaginal exam following the transabdominal exam to visualize the uterus, endometrium, ovaries and adnexa . COMPARISON:  04/15/2014 FINDINGS: Uterus Measurements: Right posterior fibroid measures up to 2.6 cm. Left anterior fundal fibroid measures up to 3.2 cm. These are calcified. Uterus measures 11.0 x 5.6 x 6.7 cm Endometrium Thickness: 7 mm in thickness.  No focal abnormality visualized. Right ovary Measurements: 3.6 x 2.1 x 4.5 cm. Small cystic areas in the right ovary (at least 2), both measuring up to 1.9 cm. These could Left ovary Measurements: 4.5 x 2.2 x 3.3 cm. Normal appearance/no adnexal mass. Other findings No abnormal free fluid. IMPRESSION: Small calcified fibroids within the uterus. Small cystic areas within the right ovary, likely small follicles. Electronically Signed   By: Rolm Baptise M.D.   On: 11/23/2015 11:36   US Pelvis Complete  11/23/2015  CLINICAL DATA:  Intermittent pelvic pain for 3-6 weeks. Remote history of uterine artery embolization and fibroids EXAM: TRANSABDOMINAL AND TRANSVAGINAL ULTRASOUND OF PELVIS TECHNIQUE: Both transabdominal and transvaginal  ultrasound examinations of the pelvis were performed. Transabdominal technique was performed for global imaging of the pelvis including uterus, ovaries, adnexal regions, and pelvic cul-de-sac. It was necessary to proceed with endovaginal exam following the transabdominal exam to visualize the uterus, endometrium, ovaries and adnexa . COMPARISON:  04/15/2014 FINDINGS: Uterus Measurements: Right posterior fibroid measures up to 2.6 cm. Left anterior fundal fibroid measures up to 3.2 cm. These are calcified. Uterus measures 11.0 x 5.6 x 6.7 cm Endometrium Thickness: 7 mm in thickness.  No focal abnormality visualized. Right ovary Measurements: 3.6 x 2.1 x 4.5 cm. Small cystic areas in the right ovary (at least 2), both measuring up to 1.9 cm. These could Left ovary Measurements: 4.5 x 2.2 x 3.3 cm. Normal appearance/no adnexal mass. Other findings No abnormal free fluid. IMPRESSION: Small calcified fibroids within the uterus. Small cystic areas within the right ovary, likely small follicles. Electronically Signed   By: Rolm Baptise M.D.   On: 11/23/2015 11:36   Ct Abdomen Pelvis W Contrast  12/15/2015  CLINICAL DATA:  Axillary lymphadenopathy.  EXAM: CT CHEST, ABDOMEN, AND PELVIS WITH CONTRAST TECHNIQUE: Multidetector CT imaging of the chest, abdomen and pelvis was performed following the standard protocol during bolus administration of intravenous contrast. CONTRAST:  125m OMNIPAQUE IOHEXOL 300 MG/ML  SOLN COMPARISON:  None. FINDINGS: CT CHEST FINDINGS Mediastinum/Lymph Nodes: Borderline to mild bilateral subpectoral and axillary lymphadenopathy is evident. Dominant lymph node in the right axilla measures 13 mm short axis. One of the dominant lymph nodes in the left axilla measures 11 mm in short axis. No mediastinal lymphadenopathy. There is no hilar lymphadenopathy. The heart size is normal. No pericardial effusion. The esophagus has normal imaging features. Lungs/Pleura: Lungs are clear without focal airspace  consolidation or pulmonary edema. No pleural effusion. No suspicious pulmonary nodule or mass. Musculoskeletal: Bone windows reveal no worrisome lytic or sclerotic osseous lesions. CT ABDOMEN PELVIS FINDINGS Hepatobiliary: 11 mm low-density lesion inferior right liver cannot be fully characterized but is likely a cyst. Gallbladder is unremarkable. No intrahepatic or extrahepatic biliary dilation. Pancreas: No focal mass lesion. No dilatation of the main duct. No intraparenchymal cyst. No peripancreatic edema. Spleen: No splenomegaly. No focal mass lesion. Adrenals/Urinary Tract: No adrenal nodule or mass. 13 mm water density lesion in the upper pole left kidney is compatible with a cyst. No evidence for enhancing lesion in either kidney. No evidence for hydroureter. The urinary bladder appears normal for the degree of distention. Stomach/Bowel: Stomach is nondistended. No gastric wall thickening. No evidence of outlet obstruction. Duodenum is normally positioned as is the ligament of Treitz. No small bowel wall thickening. No small bowel dilatation. The terminal ileum is normal. The appendix is normal. No gross colonic mass. No colonic wall thickening. No substantial diverticular change. 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 No abdominal aortic aneurysm. There is no gastrohepatic or hepatoduodenal ligament lymphadenopathy. No intraperitoneal or retroperitoneal lymphadenopathy. No pelvic sidewall lymphadenopathy. One of the largest right groin lymph nodes is 8 mm in short axis with an obvious fatty hilum. Reproductive: Calcified fibroids are seen in the uterus including a left pedunculated fundal fibroid. There is no adnexal mass. Other: No intraperitoneal free fluid. Musculoskeletal: Bone windows reveal no worrisome lytic or sclerotic osseous lesions. IMPRESSION: 1. Borderline to mildly enlarged lymph nodes in both subpectoral and axillary regions without clear  etiology. 2. 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 Consultation with interventional radiology may prove helpful to assess treatment options. 3. Calcified fibroids. 4. Probable hepatic and renal cysts. These results will be called to the ordering clinician or representative by the Radiologist Assistant, and communication documented in the PACS or zVision Dashboard. Electronically Signed   By: EMisty StanleyM.D.   On: 12/15/2015 12:09   [place]Breast Ltd Uni Left Inc Axilla  11/23/2015  CLINICAL DATA:  Possible bilateral axillary lymphadenopathy identified on recent screening mammogram. In discussion with the patient today, she reports no chronic illnesses that would explain lymphadenopathy. EXAM: ULTRASOUND OF THE BILATERAL BREAST COMPARISON:  Screening mammogram 11/08/2015 FINDINGS: On physical exam, no mass is palpated in either the left or right axilla. Targeted ultrasound is performed, showing bilateral axillary lymph nodes with cortical thickening. Some of the lymph nodes have focal nodular cortical thickening, with intervening areas of normal cortical thickness, and there are some lymph nodes with diffuse cortical thickening and attenuation of the fatty hila. All of the imaged lymph nodes do have visible fatty hila. No hyperemia is seen in association with the lymph nodes. Below  are some of the measurements obtained today. In the right axilla: There is a 1 cm short axis lymph node with cortical thickening up to 5 mm. There is a 6 mm short axis lymph node with a partially effaced fatty hilum and diffuse cortical thickening. 9 mm short axis lymph node with diffuse cortical thickening and loss of the normal echogenicity of the fatty hilum. In the left axilla: There is a lymph node with cortical thickening focally up to 8 mm. The second axillary lymph node has normal cortical thickness on the superficial aspect and cortical thickening to 5 mm along its deep aspect. IMPRESSION: Bilateral axillary  lymphadenopathy with multiple lymph nodes identified with focal and/or diffuse cortical thickening. In discussion with the patient today and upon review of her electronic medical records, no chronic medical condition is identified to readily explain this lymphadenopathy. Considerations include reactive lymphadenopathy, lymphoproliferative disorder, or metastatic disease. RECOMMENDATION: Ultrasound-guided biopsy of one axillary lymph node is recommended. It is planned to biopsy a superficially positioned left axillary lymph node with diffuse cortical thickening. The patient would like to have the biopsy performed today. Dr. Griffin Dakin office is closed today. I will provide the order for biopsy. Biopsy will be dictated separately. I have discussed the findings and recommendations with the patient. Results were also provided in writing at the conclusion of the visit. If applicable, a reminder letter will be sent to the patient regarding the next appointment. BI-RADS CATEGORY  4: Suspicious. Electronically Signed   By: Curlene Dolphin M.D.   On: 11/23/2015 10:25   US Breast Ltd Uni Right Inc Axilla  11/23/2015  CLINICAL DATA:  Possible bilateral axillary lymphadenopathy identified on recent screening mammogram. In discussion with the patient today, she reports no chronic illnesses that would explain lymphadenopathy. EXAM: ULTRASOUND OF THE BILATERAL BREAST COMPARISON:  Screening mammogram 11/08/2015 FINDINGS: On physical exam, no mass is palpated in either the left or right axilla. Targeted ultrasound is performed, showing bilateral axillary lymph nodes with cortical thickening. Some of the lymph nodes have focal nodular cortical thickening, with intervening areas of normal cortical thickness, and there are some lymph nodes with diffuse cortical thickening and attenuation of the fatty hila. All of the imaged lymph nodes do have visible fatty hila. No hyperemia is seen in association with the lymph nodes. Below are some  of the measurements obtained today. In the right axilla: There is a 1 cm short axis lymph node with cortical thickening up to 5 mm. There is a 6 mm short axis lymph node with a partially effaced fatty hilum and diffuse cortical thickening. 9 mm short axis lymph node with diffuse cortical thickening and loss of the normal echogenicity of the fatty hilum. In the left axilla: There is a lymph node with cortical thickening focally up to 8 mm. The second axillary lymph node has normal cortical thickness on the superficial aspect and cortical thickening to 5 mm along its deep aspect. IMPRESSION: Bilateral axillary lymphadenopathy with multiple lymph nodes identified with focal and/or diffuse cortical thickening. In discussion with the patient today and upon review of her electronic medical records, no chronic medical condition is identified to readily explain this lymphadenopathy. Considerations include reactive lymphadenopathy, lymphoproliferative disorder, or metastatic disease. RECOMMENDATION: Ultrasound-guided biopsy of one axillary lymph node is recommended. It is planned to biopsy a superficially positioned left axillary lymph node with diffuse cortical thickening. The patient would like to have the biopsy performed today. Dr. Griffin Dakin office is closed today. I will provide the  order for biopsy. Biopsy will be dictated separately. I have discussed the findings and recommendations with the patient. Results were also provided in writing at the conclusion of the visit. If applicable, a reminder letter will be sent to the patient regarding the next appointment. BI-RADS CATEGORY  4: Suspicious. Electronically Signed   By: Curlene Dolphin M.D.   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 Microscopic Comment: The sections show small needle core  biopsy fragments of lymph nodal tissue displaying preservation of the architecture associated with scattering of reactive appearing germinal centers. The paracortex mostly consists of small lymphoid cells associated with scattering of pigment consistent with dermatopathic changes. There is no morphologic evidence of a lymphoproliferative process and flow cytometry failed to show any monoclonal B-cell population of abnormal T-cell phenotype. There is no evidence of metastatic malignancy. Tissue-flow cytometry - no monoclonal B-cell population or abnormal T-cell phenotype identified. Concordant by Dr. Radford Pax. Recommendations: Clinical follow-up with Dr. Moshe Cipro. Screening mammogram in one year. At the patient's request, pathology and recommendations were relayed to the patient by phone. She stated she has done well following the biopsy with only soreness, but no bleeding, bruising, or hematoma. Post biopsy instructions were reviewed with the patient. All of her questions were answered. She was encouraged to call the imaging department of [facility] or [place], [place] [facility]) with any further questions or concerns. Pathology and recommendations relayed by Jetta Lout, [place] on 11/25/15. Electronically Signed   By: Curlene Dolphin M.D.   On: 11/25/2015 13:04  11/25/2015  CLINICAL DATA:  Bilateral axillary lymphadenopathy. EXAM: ULTRASOUND GUIDED CORE NEEDLE BIOPSY OF A LEFT AXILLARY NODE COMPARISON:  Previous exam(s). FINDINGS: I met with the patient and we discussed the procedure of ultrasound-guided biopsy, including benefits and alternatives. We discussed the high likelihood of a successful procedure. We discussed the risks of the procedure, including infection, bleeding, tissue injury, clip migration, and inadequate sampling. Informed written consent was given. The usual time-out protocol was performed immediately prior to the procedure. Using sterile technique and 1% Lidocaine as local  anesthetic, under direct ultrasound visualization, a 12 gauge vacuum-assisted device was used to perform biopsy of an axillary lymph node with diffuse cortical thickening using a lateral to medial approach. IMPRESSION: Ultrasound guided biopsy of left axillary lymph node. No apparent complications. Electronically Signed: By: Curlene Dolphin M.D. On: 11/25/2015 08:37    Labs:  CBC:  Recent Labs  04/14/15 0850 12/08/15 1530  WBC 3.9* 4.9  HGB 12.2 12.3  HCT 38.3 38.5  PLT 233 236    COAGS: No results for input(s): INR, APTT in the last 8760 hours.  BMP:  Recent Labs  04/14/15 0850 11/01/15 0739  NA 143 140  K 3.5 3.5  CL 99 97*  CO2 31 38*  GLUCOSE 79 90  BUN 13 10  CALCIUM 9.1 9.5  CREATININE 0.65 0.69  GFRNONAA  --  >89  GFRAA  --  >89    LIVER FUNCTION TESTS:  Recent Labs  04/14/15 0850 11/01/15 0739  BILITOT 0.6 0.7  AST 25 32  ALT 29 32*  ALKPHOS 89 97  PROT 6.9 7.3  ALBUMIN 3.9 4.1    TUMOR MARKERS: No results for input(s): AFPTM, CEA, CA199, CHROMGRNA in the last 8760 hours.  Assessment and Plan:  Very pleasant 55 year old female with an incidentally detected right renal artery aneurysm. On her initial non-arterial phase CT scan aneurysm measures between 2 and 2.4 cm.  Typically, once a renal artery aneurysm reaches a size of 2 cm there is a significant enough risk for spontaneous rupture to warrant endovascular or operative repair. First, we need to get better imaging of the aneurysm so that we can better discuss the potential treatment options.  Today, we discussed aneurysm formation in general as well as the general concepts of coil embolization, stent assisted coil embolization and aneurysm exclusion with a stent graft.  Allison Hart is highly interested and motivated to have her renal artery aneurysm repaired.  1.) CTA abdomen (can be done at [facility] in [place]). 2.) Once the CTA is completed, we will reconnect via telephone and set a date  for her probable endovascular repair.  Thank you for this interesting consult.  I greatly enjoyed meeting Allison Hart and look forward to participating in their care.  A copy of this report was sent to the requesting provider on this date.  Electronically Signed: Jacqulynn Cadet 12/22/2015, 9:31 AM   I spent a total of 40 Minutes in face to face in clinical consultation, greater than 50% of which was counseling/coordinating care for right renal artery aneurysm.

## 2015-12-24 ENCOUNTER — Ambulatory Visit (HOSPITAL_COMMUNITY)
Admission: RE | Admit: 2015-12-24 | Discharge: 2015-12-24 | Disposition: A | Discharge status: Home / Self Care | Payer: BLUE CROSS/BLUE SHIELD | Source: Ambulatory Visit | Attending: Interventional Radiology | Admitting: Interventional Radiology

## 2015-12-24 DIAGNOSIS — I722 Aneurysm of renal artery: Secondary | ICD-10-CM | POA: Diagnosis not present

## 2015-12-24 MED ORDER — IOHEXOL 350 MG/ML SOLN
100.0000 mL | Freq: Once | INTRAVENOUS | Status: AC | PRN
  Administered 2015-12-24: 100 mL via INTRAVENOUS

## 2015-12-25 ENCOUNTER — Other Ambulatory Visit: Payer: Self-pay | Admitting: Family Medicine

## 2015-12-29 ENCOUNTER — Other Ambulatory Visit (HOSPITAL_COMMUNITY): Payer: BLUE CROSS/BLUE SHIELD

## 2015-12-29 ENCOUNTER — Other Ambulatory Visit: Payer: Self-pay | Admitting: Interventional Radiology

## 2015-12-29 DIAGNOSIS — I722 Aneurysm of renal artery: Secondary | ICD-10-CM

## 2015-12-31 ENCOUNTER — Encounter: Payer: Self-pay | Admitting: Family Medicine

## 2016-01-03 ENCOUNTER — Other Ambulatory Visit: Payer: Self-pay | Admitting: Radiology

## 2016-01-04 ENCOUNTER — Encounter (HOSPITAL_COMMUNITY): Payer: Self-pay

## 2016-01-04 ENCOUNTER — Other Ambulatory Visit: Payer: Self-pay | Admitting: Interventional Radiology

## 2016-01-04 ENCOUNTER — Ambulatory Visit (HOSPITAL_COMMUNITY)
Admission: RE | Admit: 2016-01-04 | Discharge: 2016-01-04 | Disposition: A | Discharge status: Home / Self Care | Payer: BLUE CROSS/BLUE SHIELD | Source: Ambulatory Visit | Attending: Interventional Radiology | Admitting: Interventional Radiology

## 2016-01-04 DIAGNOSIS — I722 Aneurysm of renal artery: Secondary | ICD-10-CM | POA: Diagnosis not present

## 2016-01-04 DIAGNOSIS — Z87891 Personal history of nicotine dependence: Secondary | ICD-10-CM | POA: Insufficient documentation

## 2016-01-04 DIAGNOSIS — I10 Essential (primary) hypertension: Secondary | ICD-10-CM | POA: Diagnosis not present

## 2016-01-04 DIAGNOSIS — Z6832 Body mass index (BMI) 32.0-32.9, adult: Secondary | ICD-10-CM | POA: Diagnosis not present

## 2016-01-04 DIAGNOSIS — R011 Cardiac murmur, unspecified: Secondary | ICD-10-CM | POA: Diagnosis not present

## 2016-01-04 DIAGNOSIS — K219 Gastro-esophageal reflux disease without esophagitis: Secondary | ICD-10-CM | POA: Insufficient documentation

## 2016-01-04 DIAGNOSIS — R591 Generalized enlarged lymph nodes: Secondary | ICD-10-CM | POA: Insufficient documentation

## 2016-01-04 DIAGNOSIS — E669 Obesity, unspecified: Secondary | ICD-10-CM | POA: Insufficient documentation

## 2016-01-04 LAB — BASIC METABOLIC PANEL
Anion gap: 8 (ref 5–15)
BUN: 12 mg/dL (ref 6–20)
CHLORIDE: 100 mmol/L — AB (ref 101–111)
CO2: 32 mmol/L (ref 22–32)
Calcium: 9.4 mg/dL (ref 8.9–10.3)
Creatinine, Ser: 0.72 mg/dL (ref 0.44–1.00)
GFR calc Af Amer: >60 mL/min (ref >60)
GFR calc non Af Amer: >60 mL/min (ref >60)
GLUCOSE: 93 mg/dL (ref 65–99)
POTASSIUM: 3.3 mmol/L — AB (ref 3.5–5.1)
SODIUM: 140 mmol/L (ref 135–145)

## 2016-01-04 LAB — PROTIME-INR
INR: 1.09 (ref 0.00–1.49)
Prothrombin Time: 14.3 seconds (ref 11.6–15.2)

## 2016-01-04 LAB — CBC
HEMATOCRIT: 37.8 % (ref 36.0–46.0)
Hemoglobin: 11.9 g/dL — ABNORMAL LOW (ref 12.0–15.0)
MCH: 28.3 pg (ref 26.0–34.0)
MCHC: 31.5 g/dL (ref 30.0–36.0)
MCV: 90 fL (ref 78.0–100.0)
Platelets: 223 10*3/uL (ref 150–400)
RBC: 4.2 MIL/uL (ref 3.87–5.11)
RDW: 13.2 % (ref 11.5–15.5)
WBC: 3.7 10*3/uL — AB (ref 4.0–10.5)

## 2016-01-04 LAB — APTT: aPTT: 30 seconds (ref 24–37)

## 2016-01-04 MED ORDER — SODIUM CHLORIDE 0.9 % IV SOLN: INTRAVENOUS | Status: AC

## 2016-01-04 MED ORDER — MIDAZOLAM HCL 2 MG/2ML IJ SOLN
INTRAMUSCULAR | Status: AC
  Filled 2016-01-04: qty 2

## 2016-01-04 MED ORDER — LIDOCAINE HCL 1 % IJ SOLN
INTRAMUSCULAR | Status: AC
  Filled 2016-01-04: qty 20

## 2016-01-04 MED ORDER — SODIUM CHLORIDE 0.9 % IV SOLN: Freq: Once | INTRAVENOUS | Status: DC

## 2016-01-04 MED ORDER — ONDANSETRON HCL 4 MG/2ML IJ SOLN: 4.0000 mg | Freq: Four times a day (QID) | INTRAMUSCULAR | Status: DC | PRN

## 2016-01-04 MED ORDER — VERAPAMIL HCL 2.5 MG/ML IV SOLN
INTRAVENOUS | Status: AC | PRN
  Administered 2016-01-04: 2.5 mg via INTRA_ARTERIAL

## 2016-01-04 MED ORDER — NITROGLYCERIN 1 MG/10 ML FOR IR/CATH LAB
INTRA_ARTERIAL | Status: AC | PRN
  Administered 2016-01-04 (×2): 200 ug via INTRA_ARTERIAL

## 2016-01-04 MED ORDER — HEPARIN SOD (PORK) LOCK FLUSH 100 UNIT/ML IV SOLN
INTRAVENOUS | Status: AC
  Filled 2016-01-04: qty 10

## 2016-01-04 MED ORDER — IOHEXOL 300 MG/ML  SOLN
250.0000 mL | Freq: Once | INTRAMUSCULAR | Status: AC | PRN
  Administered 2016-01-04: 90 mL via INTRA_ARTERIAL

## 2016-01-04 MED ORDER — SODIUM CHLORIDE 0.9 % IV SOLN
INTRAVENOUS | Status: AC | PRN
  Administered 2016-01-04: 10 mL/h via INTRAVENOUS

## 2016-01-04 MED ORDER — HEPARIN SOD (PORK) LOCK FLUSH 100 UNIT/ML IV SOLN
INTRAVENOUS | Status: AC
  Filled 2016-01-04: qty 20

## 2016-01-04 MED ORDER — MIDAZOLAM HCL 2 MG/2ML IJ SOLN
INTRAMUSCULAR | Status: AC | PRN
  Administered 2016-01-04 (×4): 1 mg via INTRAVENOUS

## 2016-01-04 MED ORDER — SODIUM CHLORIDE 0.9% FLUSH: 3.0000 mL | INTRAVENOUS | Status: DC | PRN

## 2016-01-04 MED ORDER — VERAPAMIL HCL 2.5 MG/ML IV SOLN
INTRAVENOUS | Status: DC
Start: 2016-01-04 — End: 2016-01-05
  Filled 2016-01-04: qty 2

## 2016-01-04 MED ORDER — SODIUM CHLORIDE 0.9% FLUSH: 3.0000 mL | Freq: Two times a day (BID) | INTRAVENOUS | Status: DC

## 2016-01-04 MED ORDER — SODIUM CHLORIDE 0.9 % IV SOLN: 250.0000 mL | INTRAVENOUS | Status: DC | PRN

## 2016-01-04 MED ORDER — HEPARIN SODIUM (PORCINE) 1000 UNIT/ML IJ SOLN
INTRAMUSCULAR | Status: AC | PRN
  Administered 2016-01-04: 3000 [IU] via INTRAVENOUS

## 2016-01-04 MED ORDER — FENTANYL CITRATE (PF) 100 MCG/2ML IJ SOLN
INTRAMUSCULAR | Status: AC
  Filled 2016-01-04: qty 2

## 2016-01-04 MED ORDER — FENTANYL CITRATE (PF) 100 MCG/2ML IJ SOLN
INTRAMUSCULAR | Status: AC | PRN
  Administered 2016-01-04 (×2): 25 ug via INTRAVENOUS
  Administered 2016-01-04: 50 ug via INTRAVENOUS

## 2016-01-04 MED ORDER — ACETAMINOPHEN 325 MG PO TABS
650.0000 mg | ORAL_TABLET | ORAL | Status: DC | PRN
  Filled 2016-01-04: qty 2

## 2016-01-04 MED ORDER — HEPARIN SODIUM (PORCINE) 1000 UNIT/ML IJ SOLN
INTRAMUSCULAR | Status: AC
  Filled 2016-01-04: qty 1

## 2016-01-04 MED ORDER — NITROGLYCERIN 1 MG/10 ML FOR IR/CATH LAB
INTRA_ARTERIAL | Status: AC
  Filled 2016-01-04: qty 10

## 2016-01-04 NOTE — Procedures (Signed)
Interventional Radiology Procedure Note  Procedure: Diagnostic right renal artery angiogram  Arterial Access: Left radial, 39F --> TR Band  Complications: None  Estimated Blood Loss: None  Recommendations: - TR Band take-down per protocol - DC home at 18:00  Signed,  Criselda Peaches, MD

## 2016-01-04 NOTE — Sedation Documentation (Signed)
Patient is resting comfortably. 

## 2016-01-04 NOTE — H&P (Signed)
Chief Complaint: Patient was seen in consultation today for renal artery arteriogram at the request of Dr Ancil Linsey  Referring Physician(s): Dr Ancil Linsey  Supervising Physician: Jacqulynn Cadet  History of Present Illness: Allison Hart is a 55 y.o. 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  Now scheduled for renal arteriogram with Dr Laurence Ferrari and Dr Estanislado Pandy  Pt aware and agreeable  Past Medical History  Diagnosis Date  . GERD (gastroesophageal reflux disease)   . Essential hypertension, benign   . Obesity   . Murmur     Past Surgical History  Procedure Laterality Date  . Umbilical hernia repair  infancy   . Left inguinal hernia repair and endometriosis  2007    No definitive records   . Left parotid bx benign  2005  . Embolization of uterine artery x 2    . Colonoscopy  11/13/2012    Procedure: COLONOSCOPY;  Surgeon: Rogene Houston, MD;  [facility];  Service: Endoscopy;  Laterality: N/A;  930    Allergies: Review of patient's allergies indicates no known allergies.  Medications: Prior to Admission medications   Medication Sig Start Date End Date Taking? Authorizing Provider  acyclovir (ZOVIRAX) 800 MG tablet Take 1 tablet (800 mg total) by mouth 2 (two) times daily. Patient taking differently: Take 800 mg by mouth See admin instructions. Take 1 tablet (800 mg) by mouth daily for 2 days as needed for breakouts 10/26/14  Yes Fayrene Helper, MD  amLODipine (NORVASC) 5 MG tablet TAKE 1 TABLET BY MOUTH  EVERY DAY Patient taking differently: TAKE 1 TABLET BY MOUTH EVERY DAY AT BEDTIME 10/18/15  Yes Fayrene Helper, MD  atorvastatin (LIPITOR) 20 MG tablet TAKE 1 TABLET BY MOUTH EVERY DAY 12/27/15  Yes Fayrene Helper, MD  azelastine (ASTELIN) 0.1 % nasal spray Place 2 sprays into both nostrils 2 (two) times daily. Use in each nostril as directed Patient taking differently: Place 2 sprays into both nostrils 2 (two) times daily as needed (chronic sinusitis). Use in each nostril as directed 11/09/15  Yes Fayrene Helper, MD  fluticasone (FLONASE) 50 MCG/ACT nasal spray PLACE 2 SPRAYS INTO BOTH NOSTRILS DAILY. 11/08/15  Yes Fayrene Helper, MD  loratadine (CLARITIN) 10 MG tablet Take 10 mg by mouth daily.   Yes Historical Provider, MD  naproxen sodium (ALEVE) 220 MG tablet Take 220 mg by mouth 2 (two) times daily as needed (menstrual cramps).   Yes Historical Provider, MD  omeprazole (PRILOSEC) 20 MG capsule TAKE ONE CAPSULE BY MOUTH EVERY DAY AS NEEDED Patient taking differently: TAKE ONE CAPSULE BY MOUTH EVERY DAY AS NEEDED FOR ACID REFLUX 11/23/15  Yes Fayrene Helper, MD  triamterene-hydrochlorothiazide (MAXZIDE) 75-50 MG tablet TAKE 1 TABLET BY MOUTH EVERY DAY 11/05/15  Yes Fayrene Helper, MD  loratadine (CLARITIN REDITABS) 10 MG dissolvable tablet Take 1 tablet (10 mg total) by mouth daily. Patient not taking: Reported on 12/31/2015 04/19/15   Fayrene Helper, MD     Family History  Problem Relation Age of Onset  .  Hypertension Mother   . Hyperlipidemia Mother   . Thyroid disease Mother   . Coronary artery disease Mother   . Irregular heart beat Mother   . Fibroids Sister   . Hypertension Sister   . Ulcerative colitis Son   . Colon cancer Neg Hx     Social History   Social History  . Marital Status: Married    Spouse Name: N/A  . Number of Children: N/A  . Years of Education: N/A   Social History Main Topics  . Smoking status: Former Smoker -- 0.25 packs/day for 5  years    Types: Cigarettes  . Smokeless tobacco: Never Used  . Alcohol Use: Yes     Comment: occasionally  . Drug Use: No  . Sexual Activity: Not Asked   Other Topics Concern  . None   Social History Narrative     Review of Systems: A 12 point ROS discussed and pertinent positives are indicated in the HPI above.  All other systems are negative.  Review of Systems  Constitutional: Negative for fever, diaphoresis, activity change, appetite change, fatigue and unexpected weight change.  Respiratory: Negative for cough and shortness of breath.   Gastrointestinal: Negative for abdominal pain.  Musculoskeletal: Negative for back pain.  Neurological: Negative for weakness.  Psychiatric/Behavioral: Negative for behavioral problems and confusion.    Vital Signs: BP 110/74 mmHg  Pulse 72  Temp(Src) 98.4 F (36.9 C) (Oral)  Resp 18  Ht 5\' 9"  (1.753 m)  Wt 220 lb (99.791 kg)  BMI 32.47 kg/m2  SpO2 98%  LMP 01/04/2016 (Exact Date)  Physical Exam  Constitutional: She is oriented to person, place, and time.  Cardiovascular: Normal rate, regular rhythm and normal heart sounds.   Pulmonary/Chest: Effort normal and breath sounds normal. She has no wheezes.  Abdominal: Soft. Bowel sounds are normal. There is no tenderness.  Musculoskeletal: Normal range of motion.  Neurological: She is alert and oriented to person, place, and time.  Skin: Skin is warm and dry.  Psychiatric: She has a normal mood and affect. Her behavior is normal. Judgment and thought content normal.  Nursing note and vitals reviewed.   Mallampati Score:  MD Evaluation Airway: WNL Heart: WNL Abdomen: WNL Chest/ Lungs: WNL ASA  Classification: 2 Mallampati/Airway Score: One  Imaging: Ct Chest W Contrast  12/15/2015  CLINICAL DATA:  Axillary lymphadenopathy. EXAM: CT CHEST, ABDOMEN, AND PELVIS WITH CONTRAST TECHNIQUE: Multidetector CT imaging of the chest, abdomen and pelvis was performed following the  standard protocol during bolus administration of intravenous contrast. CONTRAST:  168mL OMNIPAQUE IOHEXOL 300 MG/ML  SOLN COMPARISON:  None. FINDINGS: CT CHEST FINDINGS Mediastinum/Lymph Nodes: Borderline to mild bilateral subpectoral and axillary lymphadenopathy is evident. Dominant lymph node in the right axilla measures 13 mm short axis. One of the dominant lymph nodes in the left axilla measures 11 mm in short axis. No mediastinal lymphadenopathy. There is no hilar lymphadenopathy. The heart size is normal. No pericardial effusion. The esophagus has normal imaging features. Lungs/Pleura: Lungs are clear without focal airspace consolidation or pulmonary edema. No pleural effusion. No suspicious pulmonary nodule or mass. Musculoskeletal: Bone windows reveal no worrisome lytic or sclerotic osseous lesions. CT ABDOMEN PELVIS FINDINGS Hepatobiliary: 11 mm low-density lesion inferior right liver cannot be fully characterized but is likely a cyst. Gallbladder is unremarkable. No intrahepatic or extrahepatic biliary dilation. Pancreas: No focal mass lesion. No dilatation of the main duct. No intraparenchymal cyst. No peripancreatic edema. Spleen: No splenomegaly. No focal mass  lesion. Adrenals/Urinary Tract: No adrenal nodule or mass. 13 mm water density lesion in the upper pole left kidney is compatible with a cyst. No evidence for enhancing lesion in either kidney. No evidence for hydroureter. The urinary bladder appears normal for the degree of distention. Stomach/Bowel: Stomach is nondistended. No gastric wall thickening. No evidence of outlet obstruction. Duodenum is normally positioned as is the ligament of Treitz. No small bowel wall thickening. No small bowel dilatation. The terminal ileum is normal. The appendix is normal. No gross colonic mass. No colonic wall thickening. No substantial diverticular change. 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 No abdominal  aortic aneurysm. There is no gastrohepatic or hepatoduodenal ligament lymphadenopathy. No intraperitoneal or retroperitoneal lymphadenopathy. No pelvic sidewall lymphadenopathy. One of the largest right groin lymph nodes is 8 mm in short axis with an obvious fatty hilum. Reproductive: Calcified fibroids are seen in the uterus including a left pedunculated fundal fibroid. There is no adnexal mass. Other: No intraperitoneal free fluid. Musculoskeletal: Bone windows reveal no worrisome lytic or sclerotic osseous lesions. IMPRESSION: 1. Borderline to mildly enlarged lymph nodes in both subpectoral and axillary regions without clear etiology. 2. 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 Consultation with interventional radiology may prove helpful to assess treatment options. 3. Calcified fibroids. 4. Probable hepatic and renal cysts. These results will be called to the ordering clinician or representative by the Radiologist Assistant, and communication documented in the PACS or zVision Dashboard. Electronically Signed   By: Misty Stanley M.D.   On: 12/15/2015 12:09   Ct Angio Abdomen W/cm &/or Wo Contrast  12/24/2015  CLINICAL DATA:  Right renal artery aneurysm EXAM: CT ANGIOGRAPHY ABDOMEN TECHNIQUE: Multidetector CT imaging of the abdomen was performed using the standard protocol during bolus administration of intravenous contrast. Multiplanar reconstructed images including MIPs were obtained and reviewed to evaluate the vascular anatomy. CONTRAST:  172mL OMNIPAQUE IOHEXOL 350 MG/ML SOLN COMPARISON:  None. FINDINGS: Lung bases are free of acute infiltrate or sizable effusion. The liver, spleen, gallbladder, adrenal glands and pancreas are within normal limits. The left kidney demonstrates a normal enhancement pattern. A small renal cyst is noted. The right kidney is well visualized and demonstrates a normal enhancement pattern. Normal delayed enhancement is seen. The right renal artery proximally is widely  patent and well visualized. In the distal aspect of the right main renal artery, there is a saccular right renal artery aneurysm is well opacified with only minimal calcification in the aneurysm wall. It measures approximately 13 mm in greatest dimension. Just beyond this initial aneurysm is a second saccular aneurysm. This aneurysm measures approximately 19 mm and demonstrates significant peripheral calcification. Only minimal mural thrombus is noted within the aneurysm. No significant compromise of arterial flow to the kidney is noted. The aorta and the remainder of its branches are within normal limits. No aneurysmal dilatation is seen. No acute bony abnormality is noted. Review of the MIP images confirms the above findings. 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 No other significant abnormality is noted. Electronically Signed   By: Inez Catalina M.D.   On: 12/24/2015 18:37   Ct Abdomen Pelvis W Contrast  12/15/2015  CLINICAL DATA:  Axillary lymphadenopathy. EXAM: CT CHEST, ABDOMEN, AND PELVIS WITH CONTRAST TECHNIQUE: Multidetector CT imaging of the chest, abdomen and pelvis was performed following the standard protocol during bolus administration of intravenous contrast.  CONTRAST:  145mL OMNIPAQUE IOHEXOL 300 MG/ML  SOLN COMPARISON:  None. FINDINGS: CT CHEST FINDINGS Mediastinum/Lymph Nodes: Borderline to mild bilateral subpectoral and axillary lymphadenopathy is evident. Dominant lymph node in the right axilla measures 13 mm short axis. One of the dominant lymph nodes in the left axilla measures 11 mm in short axis. No mediastinal lymphadenopathy. There is no hilar lymphadenopathy. The heart size is normal. No pericardial effusion. The esophagus has normal imaging features. Lungs/Pleura: Lungs are clear without focal airspace consolidation or pulmonary edema. No pleural effusion. No suspicious pulmonary nodule or mass. Musculoskeletal: Bone  windows reveal no worrisome lytic or sclerotic osseous lesions. CT ABDOMEN PELVIS FINDINGS Hepatobiliary: 11 mm low-density lesion inferior right liver cannot be fully characterized but is likely a cyst. Gallbladder is unremarkable. No intrahepatic or extrahepatic biliary dilation. Pancreas: No focal mass lesion. No dilatation of the main duct. No intraparenchymal cyst. No peripancreatic edema. Spleen: No splenomegaly. No focal mass lesion. Adrenals/Urinary Tract: No adrenal nodule or mass. 13 mm water density lesion in the upper pole left kidney is compatible with a cyst. No evidence for enhancing lesion in either kidney. No evidence for hydroureter. The urinary bladder appears normal for the degree of distention. Stomach/Bowel: Stomach is nondistended. No gastric wall thickening. No evidence of outlet obstruction. Duodenum is normally positioned as is the ligament of Treitz. No small bowel wall thickening. No small bowel dilatation. The terminal ileum is normal. The appendix is normal. No gross colonic mass. No colonic wall thickening. No substantial diverticular change. 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 No abdominal aortic aneurysm. There is no gastrohepatic or hepatoduodenal ligament lymphadenopathy. No intraperitoneal or retroperitoneal lymphadenopathy. No pelvic sidewall lymphadenopathy. One of the largest right groin lymph nodes is 8 mm in short axis with an obvious fatty hilum. Reproductive: Calcified fibroids are seen in the uterus including a left pedunculated fundal fibroid. There is no adnexal mass. Other: No intraperitoneal free fluid. Musculoskeletal: Bone windows reveal no worrisome lytic or sclerotic osseous lesions. IMPRESSION: 1. Borderline to mildly enlarged lymph nodes in both subpectoral and axillary regions without clear etiology. 2. 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 Consultation with interventional radiology may prove  helpful to assess treatment options. 3. Calcified fibroids. 4. Probable hepatic and renal cysts. These results will be called to the ordering clinician or representative by the Radiologist Assistant, and communication documented in the PACS or zVision Dashboard. Electronically Signed   By: Misty Stanley M.D.   On: 12/15/2015 12:09    Labs:  CBC:  Recent Labs  04/14/15 0850 12/08/15 1530 01/04/16 1100  WBC 3.9* 4.9 3.7*  HGB 12.2 12.3 11.9*  HCT 38.3 38.5 37.8  PLT 233 236 223    COAGS:  Recent Labs  01/04/16 1100  INR 1.09  APTT 30    BMP:  Recent Labs  04/14/15 0850 11/01/15 0739 01/04/16 1100  NA 143 140 140  K 3.5 3.5 3.3*  CL 99 97* 100*  CO2 31 38* 32  GLUCOSE 79 90 93  BUN 13 10 12   CALCIUM 9.1 9.5 9.4  CREATININE 0.65 0.69 0.72  GFRNONAA  --  >89 >60  GFRAA  --  >89 >60    LIVER FUNCTION TESTS:  Recent Labs  04/14/15 0850 11/01/15 0739  BILITOT 0.6 0.7  AST 25 32  ALT 29 32*  ALKPHOS 89 97  PROT 6.9 7.3  ALBUMIN 3.9 4.1    TUMOR  MARKERS: No results for input(s): AFPTM, CEA, CA199, CHROMGRNA in the last 8760 hours.  Assessment and Plan:  Incidental finding of Rt renal aneurysms Scheduled now for renal arteriogram Risks and Benefits discussed with the patient including, but not limited to bleeding, infection, vascular injury or contrast induced renal failure. All of the patient's questions were answered, patient is agreeable to proceed. Consent signed and in chart.  Thank you for this interesting consult.  I greatly enjoyed meeting NGUYEN SALAIZ and look forward to participating in their care.  A copy of this report was sent to the requesting provider on this date.  Electronically Signed: Monia Sabal A 01/04/2016, 11:45 AM   I spent a total of  30 Minutes   in face to face in clinical consultation, greater than 50% of which was counseling/coordinating care for renal arteriogram

## 2016-01-04 NOTE — Sedation Documentation (Signed)
Patient denies pain and is resting comfortably.  

## 2016-01-04 NOTE — Discharge Instructions (Signed)
Radial Site Care °Refer to this sheet in the next few weeks. These instructions provide you with information about caring for yourself after your procedure. Your health care provider may also give you more specific instructions. Your treatment has been planned according to current medical practices, but problems sometimes occur. Call your health care provider if you have any problems or questions after your procedure. °WHAT TO EXPECT AFTER THE PROCEDURE °After your procedure, it is typical to have the following: °· Bruising at the radial site that usually fades within 1-2 weeks. °· Blood collecting in the tissue (hematoma) that may be painful to the touch. It should usually decrease in size and tenderness within 1-2 weeks. °HOME CARE INSTRUCTIONS °· Take medicines only as directed by your health care provider. °· You may shower 24-48 hours after the procedure or as directed by your health care provider. Remove the bandage (dressing) and gently wash the site with plain soap and water. Pat the area dry with a clean towel. Do not rub the site, because this may cause bleeding. °· Do not take baths, swim, or use a hot tub until your health care provider approves. °· Check your insertion site every day for redness, swelling, or drainage. °· Do not apply powder or lotion to the site. °· Do not flex or bend the affected arm for 24 hours or as directed by your health care provider. °· Do not push or pull heavy objects with the affected arm for 24 hours or as directed by your health care provider. °· Do not lift over 10 lb (4.5 kg) for 5 days after your procedure or as directed by your health care provider. °· Ask your health care provider when it is okay to: °¨ Return to work or school. °¨ Resume usual physical activities or sports. °¨ Resume sexual activity. °· Do not drive home if you are discharged the same day as the procedure. Have someone else drive you. °· You may drive 24 hours after the procedure unless otherwise  instructed by your health care provider. °· Do not operate machinery or power tools for 24 hours after the procedure. °· If your procedure was done as an outpatient procedure, which means that you went home the same day as your procedure, a responsible adult should be with you for the first 24 hours after you arrive home. °· Keep all follow-up visits as directed by your health care provider. This is important. °SEEK MEDICAL CARE IF: °· You have a fever. °· You have chills. °· You have increased bleeding from the radial site. Hold pressure on the site. °SEEK IMMEDIATE MEDICAL CARE IF: °· You have unusual pain at the radial site. °· You have redness, warmth, or swelling at the radial site. °· You have drainage (other than a small amount of blood on the dressing) from the radial site. °· The radial site is bleeding, and the bleeding does not stop after 30 minutes of holding steady pressure on the site. °· Your arm or hand becomes pale, cool, tingly, or numb. °  °This information is not intended to replace advice given to you by your health care provider. Make sure you discuss any questions you have with your health care provider. °  °Document Released: 10/28/2010 Document Revised: 10/16/2014 Document Reviewed: 04/13/2014 °Elsevier Interactive Patient Education ©2016 [employer] ° °

## 2016-01-05 ENCOUNTER — Other Ambulatory Visit: Payer: Self-pay | Admitting: Interventional Radiology

## 2016-01-05 DIAGNOSIS — I729 Aneurysm of unspecified site: Secondary | ICD-10-CM

## 2016-01-08 HISTORY — PX: OTHER SURGICAL HISTORY: SHX169

## 2016-01-12 ENCOUNTER — Ambulatory Visit (HOSPITAL_COMMUNITY)
Admission: RE | Admit: 2016-01-12 | Discharge: 2016-01-12 | Disposition: A | Discharge status: Home / Self Care | Payer: BLUE CROSS/BLUE SHIELD | Source: Ambulatory Visit | Attending: Interventional Radiology | Admitting: Interventional Radiology

## 2016-01-12 ENCOUNTER — Other Ambulatory Visit: Payer: Self-pay | Admitting: Radiology

## 2016-01-12 DIAGNOSIS — Z8489 Family history of other specified conditions: Secondary | ICD-10-CM | POA: Diagnosis not present

## 2016-01-12 DIAGNOSIS — I7789 Other specified disorders of arteries and arterioles: Secondary | ICD-10-CM | POA: Insufficient documentation

## 2016-01-12 DIAGNOSIS — I722 Aneurysm of renal artery: Secondary | ICD-10-CM | POA: Diagnosis present

## 2016-01-12 DIAGNOSIS — I671 Cerebral aneurysm, nonruptured: Secondary | ICD-10-CM | POA: Insufficient documentation

## 2016-01-12 DIAGNOSIS — I729 Aneurysm of unspecified site: Secondary | ICD-10-CM

## 2016-01-12 NOTE — Pre-Procedure Instructions (Addendum)
KIMEKO HOFMANN  01/12/2016      [facility] [address], [place] - [address] [place] [address] Phone: [phone] Fax: [phone]  [id number] - [place], [place] [address] [place] [address] Phone: [phone] Fax: [phone]  [id number] - [place], [place] AT [facility] [place] [address] Phone: [phone] Fax: [phone]  [facility] [place], [place] [id number] [place] [address] [place] [address] Phone: [phone] Fax: [phone]    Your procedure is scheduled on Tues, April 11 @ 8:00 AM  Report to [facility] Admitting at 6:00 AM  Call this number if you have problems the morning of surgery:  [phone]   Remember:  Do not eat food or drink liquids after midnight.  Take these medicines the morning of surgery with A SIP OF WATER Acyclovir(Zovirax),Astelin Nasal Spray,Flonase(Fluticasone),Claritin(Loratadine),and Omeprazole(Prilosec),Plavix,and Aspirin.             Stop taking your Naproxen. No Goody's,BC's,Advil,Motrin,Fish Oil,or any Herbal Medications.    Do not wear jewelry, make-up or nail polish.  Do not wear lotions, powders, or perfumes.  You may wear deodorant.  Do not shave 48 hours prior to surgery.    Do not bring valuables to the hospital.  [facility] is not responsible for any belongings or valuables.  Contacts, dentures or bridgework may not be worn into surgery.  Leave your suitcase in the car.  After surgery it may be brought to your room.  For patients admitted to the hospital, discharge time will be determined by your treatment team.  Patients discharged the day of surgery will not be allowed to drive home.    Special instructions:  [facility] - Preparing for Surgery  Before surgery, you can play an important role.  Because skin is not sterile, your skin needs  to be as free of germs as possible.  You can reduce the number of germs on you skin by washing with CHG (chlorahexidine gluconate) soap before surgery.  CHG is an antiseptic cleaner which kills germs and bonds with the skin to continue killing germs even after washing.  Please DO NOT use if you have an allergy to CHG or antibacterial soaps.  If your skin becomes reddened/irritated stop using the CHG and inform your nurse when you arrive at Short Stay.  Do not shave (including legs and underarms) for at least 48 hours prior to the first CHG shower.  You may shave your face.  Please follow these instructions carefully:   1.  Shower with CHG Soap the night before surgery and the                                morning of Surgery.  2.  If you choose to wash your hair, wash your hair first as usual with your       normal shampoo.  3.  After you shampoo, rinse your hair and body thoroughly to remove the                      Shampoo.  4.  Use CHG as you would any other liquid soap.  You can apply chg directly       to the skin and wash  gently with scrungie or a clean washcloth.  5.  Apply the CHG Soap to your body ONLY FROM THE NECK DOWN.        Do not use on open wounds or open sores.  Avoid contact with your eyes,       ears, mouth and genitals (private parts).  Wash genitals (private parts)       with your normal soap.  6.  Wash thoroughly, paying special attention to the area where your surgery        will be performed.  7.  Thoroughly rinse your body with warm water from the neck down.  8.  DO NOT shower/wash with your normal soap after using and rinsing off       the CHG Soap.  9.  Pat yourself dry with a clean towel.            10.  Wear clean pajamas.            11.  Place clean sheets on your bed the night of your first shower and do not        sleep with pets.  Day of Surgery  Do not apply any lotions/deoderants the morning of surgery.  Please wear clean clothes to the hospital/surgery  center.    Please read over the following fact sheets that you were given. Pain Booklet, Coughing and Deep Breathing and Surgical Site Infection Prevention

## 2016-01-13 ENCOUNTER — Encounter (HOSPITAL_COMMUNITY)
Admission: RE | Admit: 2016-01-13 | Discharge: 2016-01-13 | Disposition: A | Discharge status: Home / Self Care | Payer: BLUE CROSS/BLUE SHIELD | Source: Ambulatory Visit | Attending: Interventional Radiology | Admitting: Interventional Radiology

## 2016-01-13 ENCOUNTER — Encounter (HOSPITAL_COMMUNITY): Payer: Self-pay

## 2016-01-13 DIAGNOSIS — Z7902 Long term (current) use of antithrombotics/antiplatelets: Secondary | ICD-10-CM | POA: Diagnosis not present

## 2016-01-13 DIAGNOSIS — Z79899 Other long term (current) drug therapy: Secondary | ICD-10-CM | POA: Diagnosis not present

## 2016-01-13 DIAGNOSIS — I10 Essential (primary) hypertension: Secondary | ICD-10-CM | POA: Insufficient documentation

## 2016-01-13 DIAGNOSIS — E7800 Pure hypercholesterolemia, unspecified: Secondary | ICD-10-CM | POA: Insufficient documentation

## 2016-01-13 DIAGNOSIS — I493 Ventricular premature depolarization: Secondary | ICD-10-CM | POA: Insufficient documentation

## 2016-01-13 DIAGNOSIS — Z01812 Encounter for preprocedural laboratory examination: Secondary | ICD-10-CM | POA: Diagnosis not present

## 2016-01-13 DIAGNOSIS — Z87891 Personal history of nicotine dependence: Secondary | ICD-10-CM | POA: Insufficient documentation

## 2016-01-13 DIAGNOSIS — Z7982 Long term (current) use of aspirin: Secondary | ICD-10-CM | POA: Diagnosis not present

## 2016-01-13 DIAGNOSIS — Z01818 Encounter for other preprocedural examination: Secondary | ICD-10-CM | POA: Diagnosis not present

## 2016-01-13 DIAGNOSIS — I722 Aneurysm of renal artery: Secondary | ICD-10-CM | POA: Insufficient documentation

## 2016-01-13 DIAGNOSIS — K219 Gastro-esophageal reflux disease without esophagitis: Secondary | ICD-10-CM | POA: Diagnosis not present

## 2016-01-13 HISTORY — DX: Frequency of micturition: R35.0

## 2016-01-13 HISTORY — DX: Chronic sinusitis, unspecified: J32.9

## 2016-01-13 HISTORY — DX: Herpesviral infection, unspecified: B00.9

## 2016-01-13 LAB — PLATELET INHIBITION P2Y12: Platelet Function  P2Y12: 201 [PRU] (ref 194–418)

## 2016-01-13 LAB — HCG, SERUM, QUALITATIVE: Preg, Serum: NEGATIVE

## 2016-01-13 NOTE — Progress Notes (Signed)
PCP - Dr. Tula Nakayama Cardiologist - denies  EKG - 01/13/16 CXR - denies  Echo- 2013 Stress test - 2013 Cardiac Cath - denies  Patient denies chest pain and shortness of breath at PAT appointment.

## 2016-01-14 NOTE — Progress Notes (Signed)
Anesthesia Chart Review: Patient is a 55 year old female scheduled for IR procedure by Dr. Estanislado Pandy on 01/18/16. 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   Other history includes former smoker, GERD, HTN, murmur, hypercholesterolemia, left IHR, embolization of uterine artery X 2. BMI is consistent with obesity. PCP is Dr. Tula Nakayama.  Meds include Zovirax, PRN, amlodipine, ASA 325mg , Lipitor, Plavix, Flonase, Claritin, Prilosec, Maxzide.  01/13/16 EKG: SR with sinus arrhythmia, first degree AVB, occasional PVCs and PACs, inferior infarct (age undetermined), prolonged QT (QTc 476 ms). When compared to ECG of 01/22/06, ectopy is new. Low of r wave in aVF is new when compared to 03/21/12 tracing but noted on her 02/21/12 tracing.  04/01/12 Dobutamine Stress [facility]): There was no clearly diagnostic wall motion abnormalities to indicate ischemia based on review of all images obtained during the study. Resting EF 55-60%.  12/15/15 CT chest/abd/pelvis: IMPRESSION: 1. Borderline to mildly enlarged lymph nodes in both subpectoral and axillary regions without clear etiology. 2. 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 Consultation with interventional radiology may prove helpful to assess treatment options. 3. Calcified fibroids. 4. Probable hepatic and renal cysts.  Preoperative labs noted.   She denied CP and SOB at PAT. PVCs are new on EKG, but otherwise I think her tracing is stable since 2013. If no acute changes then I anticipate that she can proceed as planned.  George Hugh [facility] Short Stay Center/Anesthesiology Phone [phone] 01/14/2016 9:29 AM

## 2016-01-17 ENCOUNTER — Other Ambulatory Visit: Payer: Self-pay | Admitting: Radiology

## 2016-01-17 NOTE — Anesthesia Preprocedure Evaluation (Addendum)
Anesthesia Evaluation  Patient identified by MRN, date of birth, ID band Patient awake    Reviewed: Allergy & Precautions, NPO status , Patient's Chart, lab work & pertinent test results  Airway Mallampati: II  TM Distance: >3 FB Neck ROM: full    Dental  (+) Teeth Intact, Dental Advidsory Given   Pulmonary former smoker,    breath sounds clear to auscultation       Cardiovascular hypertension, Pt. on medications  Rhythm:regular Rate:Normal     Neuro/Psych  Neuromuscular disease negative psych ROS   GI/Hepatic Neg liver ROS, GERD  Medicated,  Endo/Other  negative endocrine ROS  Renal/GU negative Renal ROS  negative genitourinary   Musculoskeletal negative musculoskeletal ROS (+)   Abdominal Normal abdominal exam  (+)   Peds negative pediatric ROS (+)  Hematology negative hematology ROS (+)   Anesthesia Other Findings   Reproductive/Obstetrics negative OB ROS                          Anesthesia Physical Anesthesia Plan  ASA: II  Anesthesia Plan: General   Post-op Pain Management:    Induction: Intravenous  Airway Management Planned: Oral ETT  Additional Equipment: Arterial line  Intra-op Plan:   Post-operative Plan: Extubation in OR  Informed Consent: I have reviewed the patients History and Physical, chart, labs and discussed the procedure including the risks, benefits and alternatives for the proposed anesthesia with the patient or authorized representative who has indicated his/her understanding and acceptance.   Dental advisory given and Dental Advisory Given  Plan Discussed with: CRNA, Anesthesiologist and Surgeon  Anesthesia Plan Comments:       Anesthesia Quick Evaluation

## 2016-01-18 ENCOUNTER — Encounter (HOSPITAL_COMMUNITY): Payer: Self-pay

## 2016-01-18 ENCOUNTER — Observation Stay (HOSPITAL_COMMUNITY)
Admission: RE | Admit: 2016-01-18 | Discharge: 2016-01-19 | Disposition: A | Discharge status: Home / Self Care | Payer: BLUE CROSS/BLUE SHIELD | Source: Ambulatory Visit | Attending: Interventional Radiology | Admitting: Interventional Radiology

## 2016-01-18 ENCOUNTER — Ambulatory Visit (HOSPITAL_COMMUNITY): Payer: BLUE CROSS/BLUE SHIELD | Admitting: Vascular Surgery

## 2016-01-18 ENCOUNTER — Encounter (HOSPITAL_COMMUNITY): Payer: Self-pay | Admitting: Anesthesiology

## 2016-01-18 ENCOUNTER — Ambulatory Visit (HOSPITAL_COMMUNITY)
Admission: RE | Admit: 2016-01-18 | Discharge: 2016-01-18 | Disposition: A | Discharge status: Home / Self Care | Payer: BLUE CROSS/BLUE SHIELD | Source: Ambulatory Visit | Attending: Interventional Radiology | Admitting: Interventional Radiology

## 2016-01-18 ENCOUNTER — Encounter (HOSPITAL_COMMUNITY)
Admission: RE | Disposition: A | Discharge status: Home / Self Care | Payer: Self-pay | Source: Ambulatory Visit | Attending: Interventional Radiology

## 2016-01-18 ENCOUNTER — Ambulatory Visit (HOSPITAL_COMMUNITY): Payer: BLUE CROSS/BLUE SHIELD | Admitting: Anesthesiology

## 2016-01-18 DIAGNOSIS — I10 Essential (primary) hypertension: Secondary | ICD-10-CM | POA: Insufficient documentation

## 2016-01-18 DIAGNOSIS — Z6832 Body mass index (BMI) 32.0-32.9, adult: Secondary | ICD-10-CM | POA: Insufficient documentation

## 2016-01-18 DIAGNOSIS — Z7951 Long term (current) use of inhaled steroids: Secondary | ICD-10-CM | POA: Insufficient documentation

## 2016-01-18 DIAGNOSIS — E669 Obesity, unspecified: Secondary | ICD-10-CM | POA: Diagnosis not present

## 2016-01-18 DIAGNOSIS — Z87891 Personal history of nicotine dependence: Secondary | ICD-10-CM | POA: Diagnosis not present

## 2016-01-18 DIAGNOSIS — K219 Gastro-esophageal reflux disease without esophagitis: Secondary | ICD-10-CM | POA: Insufficient documentation

## 2016-01-18 DIAGNOSIS — Z79899 Other long term (current) drug therapy: Secondary | ICD-10-CM | POA: Insufficient documentation

## 2016-01-18 DIAGNOSIS — I722 Aneurysm of renal artery: Secondary | ICD-10-CM | POA: Diagnosis not present

## 2016-01-18 DIAGNOSIS — E7800 Pure hypercholesterolemia, unspecified: Secondary | ICD-10-CM | POA: Insufficient documentation

## 2016-01-18 DIAGNOSIS — Z7902 Long term (current) use of antithrombotics/antiplatelets: Secondary | ICD-10-CM | POA: Diagnosis not present

## 2016-01-18 DIAGNOSIS — I729 Aneurysm of unspecified site: Secondary | ICD-10-CM

## 2016-01-18 HISTORY — PX: RADIOLOGY WITH ANESTHESIA: SHX6223

## 2016-01-18 LAB — CBC WITH DIFFERENTIAL/PLATELET
Basophils Absolute: 0.1 10*3/uL (ref 0.0–0.1)
Basophils Relative: 1 %
EOS ABS: 0.4 10*3/uL (ref 0.0–0.7)
Eosinophils Relative: 8 %
HCT: 37.7 % (ref 36.0–46.0)
HEMOGLOBIN: 11.6 g/dL — AB (ref 12.0–15.0)
LYMPHS ABS: 1.1 10*3/uL (ref 0.7–4.0)
LYMPHS PCT: 25 %
MCH: 27.2 pg (ref 26.0–34.0)
MCHC: 30.8 g/dL (ref 30.0–36.0)
MCV: 88.5 fL (ref 78.0–100.0)
MONOS PCT: 12 %
Monocytes Absolute: 0.5 10*3/uL (ref 0.1–1.0)
NEUTROS PCT: 54 %
Neutro Abs: 2.3 10*3/uL (ref 1.7–7.7)
Platelets: 190 10*3/uL (ref 150–400)
RBC: 4.26 MIL/uL (ref 3.87–5.11)
RDW: 13.3 % (ref 11.5–15.5)
WBC: 4.4 10*3/uL (ref 4.0–10.5)

## 2016-01-18 LAB — BASIC METABOLIC PANEL
Anion gap: 12 (ref 5–15)
BUN: 9 mg/dL (ref 6–20)
CHLORIDE: 102 mmol/L (ref 101–111)
CO2: 26 mmol/L (ref 22–32)
CREATININE: 0.61 mg/dL (ref 0.44–1.00)
Calcium: 9.2 mg/dL (ref 8.9–10.3)
GFR calc Af Amer: >60 mL/min (ref >60)
GFR calc non Af Amer: >60 mL/min (ref >60)
Glucose, Bld: 90 mg/dL (ref 65–99)
POTASSIUM: 2.9 mmol/L — AB (ref 3.5–5.1)
Sodium: 140 mmol/L (ref 135–145)

## 2016-01-18 LAB — PROTIME-INR
INR: 1.17 (ref 0.00–1.49)
Prothrombin Time: 15.1 seconds (ref 11.6–15.2)

## 2016-01-18 LAB — POCT ACTIVATED CLOTTING TIME: Activated Clotting Time: 183 seconds

## 2016-01-18 LAB — APTT: aPTT: 31 seconds (ref 24–37)

## 2016-01-18 LAB — PLATELET INHIBITION P2Y12: PLATELET FUNCTION P2Y12: 166 [PRU] — AB (ref 194–418)

## 2016-01-18 SURGERY — RADIOLOGY WITH ANESTHESIA
Anesthesia: General

## 2016-01-18 MED ORDER — PROMETHAZINE HCL 25 MG RE SUPP
25.0000 mg | Freq: Three times a day (TID) | RECTAL | Status: DC | PRN
  Filled 2016-01-18: qty 1

## 2016-01-18 MED ORDER — HEPARIN SODIUM (PORCINE) 1000 UNIT/ML IJ SOLN
INTRAMUSCULAR | Status: DC | PRN
  Administered 2016-01-18: 3000 [IU] via INTRAVENOUS
  Administered 2016-01-18: 2000 [IU] via INTRAVENOUS

## 2016-01-18 MED ORDER — ONDANSETRON HCL 4 MG/2ML IJ SOLN: 4.0000 mg | Freq: Four times a day (QID) | INTRAMUSCULAR | Status: DC | PRN

## 2016-01-18 MED ORDER — SODIUM CHLORIDE 0.9 % IV SOLN
10.0000 mg | INTRAVENOUS | Status: DC | PRN
  Administered 2016-01-18: 25 ug/min via INTRAVENOUS

## 2016-01-18 MED ORDER — SODIUM CHLORIDE 0.9% FLUSH: 3.0000 mL | Freq: Two times a day (BID) | INTRAVENOUS | Status: DC

## 2016-01-18 MED ORDER — SODIUM CHLORIDE 0.9 % IV SOLN
INTRAVENOUS | Status: AC
  Administered 2016-01-18: 17:00:00 via INTRAVENOUS

## 2016-01-18 MED ORDER — PHENYLEPHRINE HCL 10 MG/ML IJ SOLN
INTRAMUSCULAR | Status: DC | PRN
  Administered 2016-01-18 (×2): 80 ug via INTRAVENOUS

## 2016-01-18 MED ORDER — SODIUM CHLORIDE 0.9 % IV SOLN: 250.0000 mL | INTRAVENOUS | Status: DC | PRN

## 2016-01-18 MED ORDER — SODIUM CHLORIDE 0.9% FLUSH: 3.0000 mL | INTRAVENOUS | Status: DC | PRN

## 2016-01-18 MED ORDER — VECURONIUM BROMIDE 10 MG IV SOLR
INTRAVENOUS | Status: DC | PRN
  Administered 2016-01-18: 2 mg via INTRAVENOUS
  Administered 2016-01-18: 1 mg via INTRAVENOUS
  Administered 2016-01-18 (×2): 2 mg via INTRAVENOUS

## 2016-01-18 MED ORDER — SODIUM CHLORIDE 0.9 % IV SOLN: INTRAVENOUS | Status: DC

## 2016-01-18 MED ORDER — LORATADINE 10 MG PO TABS
10.0000 mg | ORAL_TABLET | Freq: Every day | ORAL | Status: DC
  Administered 2016-01-19: 10 mg via ORAL
  Filled 2016-01-18: qty 1

## 2016-01-18 MED ORDER — IOPAMIDOL (ISOVUE-300) INJECTION 61%
INTRAVENOUS | Status: AC
  Administered 2016-01-24: 130 mL
  Filled 2016-01-18: qty 150

## 2016-01-18 MED ORDER — ATORVASTATIN CALCIUM 20 MG PO TABS: 20.0000 mg | ORAL_TABLET | Freq: Every day | ORAL | Status: DC

## 2016-01-18 MED ORDER — IOPAMIDOL (ISOVUE-300) INJECTION 61%
INTRAVENOUS | Status: AC
Start: 2016-01-18 — End: 2016-01-24
  Administered 2016-01-24: 100 mL
  Filled 2016-01-18: qty 50

## 2016-01-18 MED ORDER — LACTATED RINGERS IV SOLN
INTRAVENOUS | Status: DC | PRN
  Administered 2016-01-18: 09:00:00 via INTRAVENOUS

## 2016-01-18 MED ORDER — ROCURONIUM BROMIDE 100 MG/10ML IV SOLN
INTRAVENOUS | Status: DC | PRN
  Administered 2016-01-18: 10 mg via INTRAVENOUS
  Administered 2016-01-18: 40 mg via INTRAVENOUS

## 2016-01-18 MED ORDER — PANTOPRAZOLE SODIUM 40 MG PO TBEC: 40.0000 mg | DELAYED_RELEASE_TABLET | Freq: Every day | ORAL | Status: DC

## 2016-01-18 MED ORDER — EPHEDRINE SULFATE 50 MG/ML IJ SOLN
INTRAMUSCULAR | Status: DC | PRN
  Administered 2016-01-18: 10 mg via INTRAVENOUS

## 2016-01-18 MED ORDER — CLOPIDOGREL BISULFATE 75 MG PO TABS
75.0000 mg | ORAL_TABLET | Freq: Every day | ORAL | Status: DC
  Administered 2016-01-19: 75 mg via ORAL
  Filled 2016-01-18: qty 1

## 2016-01-18 MED ORDER — PROMETHAZINE HCL 25 MG PO TABS: 25.0000 mg | ORAL_TABLET | Freq: Three times a day (TID) | ORAL | Status: DC | PRN

## 2016-01-18 MED ORDER — MIDAZOLAM HCL 5 MG/5ML IJ SOLN
INTRAMUSCULAR | Status: DC | PRN
  Administered 2016-01-18: 1 mg via INTRAVENOUS

## 2016-01-18 MED ORDER — LIDOCAINE HCL (CARDIAC) 20 MG/ML IV SOLN
INTRAVENOUS | Status: DC | PRN
  Administered 2016-01-18: 65 mg via INTRAVENOUS

## 2016-01-18 MED ORDER — TRIAMTERENE-HCTZ 75-50 MG PO TABS
1.0000 | ORAL_TABLET | Freq: Every day | ORAL | Status: DC
  Administered 2016-01-19: 1 via ORAL
  Filled 2016-01-18 (×2): qty 1

## 2016-01-18 MED ORDER — FENTANYL CITRATE (PF) 100 MCG/2ML IJ SOLN
INTRAMUSCULAR | Status: DC | PRN
  Administered 2016-01-18 (×2): 50 ug via INTRAVENOUS

## 2016-01-18 MED ORDER — SUGAMMADEX SODIUM 200 MG/2ML IV SOLN
INTRAVENOUS | Status: DC | PRN
  Administered 2016-01-18: 200 mg via INTRAVENOUS

## 2016-01-18 MED ORDER — DOCUSATE SODIUM 100 MG PO CAPS
100.0000 mg | ORAL_CAPSULE | Freq: Two times a day (BID) | ORAL | Status: DC
  Administered 2016-01-18 – 2016-01-19 (×2): 100 mg via ORAL
  Filled 2016-01-18: qty 1

## 2016-01-18 MED ORDER — CEFAZOLIN SODIUM-DEXTROSE 2-4 GM/100ML-% IV SOLN: 2.0000 g | Freq: Once | INTRAVENOUS | Status: DC

## 2016-01-18 MED ORDER — HYDROCODONE-ACETAMINOPHEN 5-325 MG PO TABS
1.0000 | ORAL_TABLET | ORAL | Status: DC | PRN
  Administered 2016-01-18 – 2016-01-19 (×2): 2 via ORAL
  Administered 2016-01-19: 1 via ORAL
  Filled 2016-01-18: qty 2
  Filled 2016-01-18: qty 1
  Filled 2016-01-18: qty 2

## 2016-01-18 MED ORDER — ASPIRIN 325 MG PO TABS
325.0000 mg | ORAL_TABLET | Freq: Every day | ORAL | Status: DC
  Administered 2016-01-19: 325 mg via ORAL
  Filled 2016-01-18: qty 1

## 2016-01-18 MED ORDER — CEFAZOLIN SODIUM-DEXTROSE 2-4 GM/100ML-% IV SOLN
INTRAVENOUS | Status: AC
  Administered 2016-01-18: 2 g via INTRAVENOUS
  Filled 2016-01-18: qty 100

## 2016-01-18 MED ORDER — LACTATED RINGERS IV SOLN
INTRAVENOUS | Status: DC | PRN
  Administered 2016-01-18 (×3): via INTRAVENOUS

## 2016-01-18 MED ORDER — PROPOFOL 10 MG/ML IV BOLUS
INTRAVENOUS | Status: DC | PRN
  Administered 2016-01-18: 150 mg via INTRAVENOUS

## 2016-01-18 MED ORDER — POTASSIUM CHLORIDE 10 MEQ/100ML IV SOLN
10.0000 meq | INTRAVENOUS | Status: AC
  Administered 2016-01-18 (×3): 10 meq via INTRAVENOUS
  Filled 2016-01-18 (×4): qty 100

## 2016-01-18 MED ORDER — ONDANSETRON HCL 4 MG/2ML IJ SOLN
INTRAMUSCULAR | Status: DC | PRN
  Administered 2016-01-18: 4 mg via INTRAVENOUS

## 2016-01-18 MED ORDER — AMLODIPINE BESYLATE 5 MG PO TABS
5.0000 mg | ORAL_TABLET | Freq: Every day | ORAL | Status: DC
  Administered 2016-01-18: 5 mg via ORAL
  Filled 2016-01-18: qty 1

## 2016-01-18 NOTE — H&P (Signed)
Chief Complaint: renal artery aneurysm  Referring Physician: Dr. Ancil Linsey  Supervising Physician: Jacqulynn Cadet  HPI: Allison Hart is an 55 y.o. female who was evaluated by Dr. Laurence Ferrari for an incidental find of a renal artery aneurysm.  She noted some LAD in her axilla on routine mammogram and underwent a work up to rule out malignancy.  On this scan she was noted to have a right renal artery aneurysm.  After further diagnostic evaluation and evaluation in the office, the decision was made to pursue treatment.  She has been asymptomatic, otherwise.   Past Medical History:  Past Medical History  Diagnosis Date  . GERD (gastroesophageal reflux disease)   . Essential hypertension, benign   . Obesity   . Murmur   . Hypercholesteremia   . Herpes   . Sinusitis   . Urinary frequency     Past Surgical History:  Past Surgical History  Procedure Laterality Date  . Umbilical hernia repair  infancy   . Left inguinal hernia repair and endometriosis  2007    No definitive records   . Left parotid bx benign  2005  . Embolization of uterine artery x 2    . Colonoscopy  11/13/2012    Procedure: COLONOSCOPY;  Surgeon: Rogene Houston, MD;  Location: [facility];  Service: Endoscopy;  Laterality: N/A;  54    Family History:  Family History  Problem Relation Age of Onset  . Hypertension Mother   . Hyperlipidemia Mother   . Thyroid disease Mother   . Coronary artery disease Mother   . Irregular heart beat Mother   . Fibroids Sister   . Hypertension Sister   . Ulcerative colitis Son   . Colon cancer Neg Hx     Social History:  reports that she has quit smoking. Her smoking use included Cigarettes. She has a 1.25 pack-year smoking history. She has never used smokeless tobacco. She reports that she drinks alcohol. She reports that she does not use illicit drugs.  Allergies: No Known Allergies  Medications:   Medication List    Notice    This visit is during an  admission. Changes to the med list made in this visit will be reflected in the After Visit Summary of the admission.      Please HPI for pertinent positives, otherwise complete 10 system ROS negative.  Mallampati Score: MD Evaluation Airway: WNL Heart: WNL Abdomen: WNL Chest/ Lungs: WNL ASA  Classification: 2 Mallampati/Airway Score: One  Physical Exam: BP 140/91 mmHg  Pulse 80  Temp(Src) 97.9 F (36.6 C)  Resp 18  Ht _0  (1.753 m)  Wt 223 lb (101.152 kg)  BMI 32.92 kg/m2  SpO2 98%  LMP 01/04/2016 (Approximate) Body mass index is 32.92 kg/(m^2). General: pleasant, obese black female who is laying in bed in NAD HEENT: head is normocephalic, atraumatic.  Sclera are noninjected.  PERRL.  Ears and nose without any masses or lesions.  Mouth is pink and moist Heart: regular, rate, and rhythm.  Normal s1,s2. No obvious murmurs, gallops, or rubs noted.  Palpable radial and pedal pulses bilaterally Lungs: CTAB, no wheezes, rhonchi, or rales noted.  Respiratory effort nonlabored Abd: soft, NT, ND, +BS, no masses, hernias, or organomegaly MS: all 4 extremities are symmetrical with no cyanosis, clubbing, or edema. Psych: A&Ox3 with an appropriate affect.   Labs: Results for orders placed or performed during the hospital encounter of 01/18/16 (from the past 48 hour(s))  APTT  Status: None   Collection Time: 01/18/16  6:57 AM  Result Value Ref Range   aPTT 31 24 - 37 seconds  Basic metabolic panel     Status: Abnormal   Collection Time: 01/18/16  6:57 AM  Result Value Ref Range   Sodium 140 135 - 145 mmol/L   Potassium 2.9 (L) 3.5 - 5.1 mmol/L   Chloride 102 101 - 111 mmol/L   CO2 26 22 - 32 mmol/L   Glucose, Bld 90 65 - 99 mg/dL   BUN 9 6 - 20 mg/dL   Creatinine, Ser 0.61 0.44 - 1.00 mg/dL   Calcium 9.2 8.9 - 10.3 mg/dL   GFR calc non Af Amer >60 >60 mL/min   GFR calc Af Amer >60 >60 mL/min    Comment: (NOTE) The eGFR has been calculated using the CKD EPI  equation. This calculation has not been validated in all clinical situations. eGFR's persistently <60 mL/min signify possible Chronic Kidney Disease.    Anion gap 12 5 - 15  CBC with Differential/Platelet     Status: Abnormal   Collection Time: 01/18/16  6:57 AM  Result Value Ref Range   WBC 4.4 4.0 - 10.5 K/uL   RBC 4.26 3.87 - 5.11 MIL/uL   Hemoglobin 11.6 (L) 12.0 - 15.0 g/dL   HCT 37.7 36.0 - 46.0 %   MCV 88.5 78.0 - 100.0 fL   MCH 27.2 26.0 - 34.0 pg   MCHC 30.8 30.0 - 36.0 g/dL   RDW 13.3 11.5 - 15.5 %   Platelets 190 150 - 400 K/uL   Neutrophils Relative % 54 %   Neutro Abs 2.3 1.7 - 7.7 K/uL   Lymphocytes Relative 25 %   Lymphs Abs 1.1 0.7 - 4.0 K/uL   Monocytes Relative 12 %   Monocytes Absolute 0.5 0.1 - 1.0 K/uL   Eosinophils Relative 8 %   Eosinophils Absolute 0.4 0.0 - 0.7 K/uL   Basophils Relative 1 %   Basophils Absolute 0.1 0.0 - 0.1 K/uL  Protime-INR     Status: None   Collection Time: 01/18/16  6:57 AM  Result Value Ref Range   Prothrombin Time 15.1 11.6 - 15.2 seconds   INR 1.17 0.00 - 1.49    Imaging: No results found.  Assessment/Plan 1. Right renal artery aneurysm -will plan for repair of aneurysm today. -patient's labs and vitals are all normal except her potassium is 2.9.  This is likely secondary to her lasix.  I will give her 4 runs of potassium. -Risks and Benefits discussed with the patient including, but not limited to bleeding, infection, vascular injury or contrast induced renal failure. All of the patient's questions were answered, patient is agreeable to proceed. Consent signed and in chart.   Thank you for this interesting consult.  I greatly enjoyed meeting Allison Hart and look forward to participating in their care.  A copy of this report was sent to the requesting provider on this date.  Electronically Signed: Henreitta Cea 01/18/2016, 7:43 AM   I spent a total of   40 minutes in face to face in clinical consultation,  greater than 50% of which was counseling/coordinating care for right renal artery aneurysm.

## 2016-01-18 NOTE — Transfer of Care (Signed)
Immediate Anesthesia Transfer of Care Note  Patient: Allison Hart  Procedure(s) Performed: Procedure(s): RADIOLOGY WITH ANESTHESIA (N/A)  Patient Location: PACU  Anesthesia Type:General  Level of Consciousness: awake and alert   Airway & Oxygen Therapy: Patient Spontanous Breathing and Patient connected to face mask oxygen  Post-op Assessment: Report given to RN, Post -op Vital signs reviewed and stable and Patient moving all extremities X 4  Post vital signs: stable  Last Vitals: There were no vitals filed for this visit.  Complications: No apparent anesthesia complications

## 2016-01-18 NOTE — Anesthesia Procedure Notes (Signed)
Procedure Name: Intubation Date/Time: 01/18/2016 11:39 AM Performed by: Neldon Newport Pre-anesthesia Checklist: Patient being monitored, Suction available, Emergency Drugs available, Patient identified and Timeout performed Patient Re-evaluated:Patient Re-evaluated prior to inductionOxygen Delivery Method: Circle system utilized Preoxygenation: Pre-oxygenation with 100% oxygen Intubation Type: IV induction Ventilation: Mask ventilation without difficulty Laryngoscope Size: McGraph and 3 Grade View: Grade I Tube type: Oral Tube size: 7.0 mm Number of attempts: 1 Placement Confirmation: positive ETCO2,  ETT inserted through vocal cords under direct vision and breath sounds checked- equal and bilateral Secured at: 22 cm Tube secured with: Tape Dental Injury: Teeth and Oropharynx as per pre-operative assessment

## 2016-01-18 NOTE — Procedures (Signed)
Interventional Radiology Procedure Note  Procedure:  Endovascular repair of complex right renal artery aneurysm.  Pipeline flow diverting stent x2  Access:  Right CFA, 36F --> ExoSeal  Complications: None.  Estimated Blood Loss: 0  Recommendations: - Admit 23 obs - Bedrest x 4 hrs - ADAT - Labs in am  Signed,  Criselda Peaches, MD

## 2016-01-19 ENCOUNTER — Encounter (HOSPITAL_COMMUNITY): Payer: Self-pay | Admitting: Interventional Radiology

## 2016-01-19 ENCOUNTER — Other Ambulatory Visit: Payer: Self-pay | Admitting: *Deleted

## 2016-01-19 DIAGNOSIS — I722 Aneurysm of renal artery: Secondary | ICD-10-CM

## 2016-01-19 LAB — CBC
HCT: 33.1 % — ABNORMAL LOW (ref 36.0–46.0)
Hemoglobin: 10.5 g/dL — ABNORMAL LOW (ref 12.0–15.0)
MCH: 28.7 pg (ref 26.0–34.0)
MCHC: 31.7 g/dL (ref 30.0–36.0)
MCV: 90.4 fL (ref 78.0–100.0)
Platelets: 184 10*3/uL (ref 150–400)
RBC: 3.66 MIL/uL — ABNORMAL LOW (ref 3.87–5.11)
RDW: 13.7 % (ref 11.5–15.5)
WBC: 4.6 10*3/uL (ref 4.0–10.5)

## 2016-01-19 LAB — BASIC METABOLIC PANEL
ANION GAP: 10 (ref 5–15)
BUN: 7 mg/dL (ref 6–20)
CALCIUM: 8.5 mg/dL — AB (ref 8.9–10.3)
CO2: 29 mmol/L (ref 22–32)
CREATININE: 0.66 mg/dL (ref 0.44–1.00)
Chloride: 102 mmol/L (ref 101–111)
Glucose, Bld: 99 mg/dL (ref 65–99)
Potassium: 3.1 mmol/L — ABNORMAL LOW (ref 3.5–5.1)
SODIUM: 141 mmol/L (ref 135–145)

## 2016-01-19 NOTE — Progress Notes (Signed)
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 Pipeline flow diverting stent x2 01/18/2016 Pt has done well overnight Eating well; slept well Denies N/V Slight headache Ambulating in room Has urinated on own x 2 Passing gas  Allergies: Versed  Medications: Prior to Admission medications   Medication Sig Start Date End Date Taking? Authorizing Provider  acyclovir (ZOVIRAX) 800 MG tablet Take 1 tablet (800 mg total) by mouth 2 (two) times daily. Patient taking differently: Take 800 mg by mouth See admin instructions. Take 1 tablet (800 mg) by mouth daily for 2 days as needed for breakouts 10/26/14  Yes Fayrene Helper, MD  amLODipine (NORVASC) 5 MG tablet TAKE 1 TABLET BY MOUTH EVERY DAY Patient taking differently: TAKE 1 TABLET BY MOUTH EVERY DAY AT BEDTIME 10/18/15  Yes Fayrene Helper, MD  aspirin 325 MG tablet Take 325 mg by mouth daily.   Yes Historical Provider, MD  atorvastatin (LIPITOR) 20 MG tablet TAKE 1 TABLET BY MOUTH EVERY DAY 12/27/15  Yes Fayrene Helper, MD  azelastine (ASTELIN) 0.1 % nasal spray Place 2 sprays into both nostrils 2 (two) times daily. Use in each nostril as directed Patient taking differently: Place 2 sprays into both nostrils 2 (two) times daily as needed (chronic sinusitis). Use in each nostril as directed 11/09/15  Yes Fayrene Helper, MD  clopidogrel (PLAVIX) 75 MG tablet Take 75 mg by mouth daily.   Yes Historical Provider, MD  fluticasone (FLONASE) 50 MCG/ACT nasal spray PLACE 2 SPRAYS INTO BOTH NOSTRILS DAILY. 11/08/15  Yes Fayrene Helper, MD  loratadine (CLARITIN) 10 MG tablet Take 10 mg by mouth daily.   Yes Historical Provider, MD  naproxen sodium (ALEVE) 220 MG  tablet Take 220 mg by mouth 2 (two) times daily as needed (menstrual cramps).   Yes Historical Provider, MD  omeprazole (PRILOSEC) 20 MG capsule TAKE ONE CAPSULE BY MOUTH EVERY DAY AS NEEDED Patient taking differently: TAKE ONE CAPSULE BY MOUTH EVERY DAY AS NEEDED FOR ACID REFLUX 11/23/15  Yes Fayrene Helper, MD  triamterene-hydrochlorothiazide (MAXZIDE) 75-50 MG tablet TAKE 1 TABLET BY MOUTH EVERY DAY 11/05/15  Yes Fayrene Helper, MD     Vital Signs: BP 129/82 mmHg  Pulse 80  Temp(Src) 98.5 F (36.9 C) (Oral)  Resp 18  SpO2 95%  LMP 01/04/2016 (Exact Date)  Physical Exam  Constitutional: She is oriented to person, place, and time.  Cardiovascular: Normal rate, regular rhythm and normal heart sounds.   Pulmonary/Chest: Effort normal and breath sounds normal. She has no wheezes.  Abdominal: Soft. Bowel sounds are normal.  Musculoskeletal: Normal range of motion.  Rt groin NT no bleeding No hematoma Rt foot 2+ pulses  Neurological: She is alert and oriented to person, place, and time.  Skin: Skin is warm.  Psychiatric: She has a normal mood and affect. Her behavior is normal. Judgment and thought content normal.  Nursing note and vitals reviewed.   Imaging: Ir Angiogram Renal Right Selective  01/18/2016  INDICATION: 55 year old female with a bilobed right renal artery aneurysm. The larger lobe measures up to 2.4 cm in diameter which is of a size concerning for spontaneous rupture. She presents today for renal angiogram and attempted endovascular repair by placement of flow diverting pipeline endovascular stents.  EXAM: IR EMBO ARTERIAL NOT HEMORR HEMANG INC GUIDE ROADMAPPING; IR ULTRASOUND GUIDANCE VASC ACCESS RIGHT; ARTERIOGRAPHY; ADDITIONAL ARTERIOGRAPHY; IR RENAL SUPRASEL UNILATERAL S+I MODERATE SEDATION MEDICATIONS: 2 g Ancef. The antibiotic was administered within 1 hour of the procedure ANESTHESIA/SEDATION: General anesthesia was provided by the anesthesiology service.  CONTRAST:  100 mL FLUOROSCOPY TIME:  Fluoroscopy Time: 87 minutes 42 seconds (2735 mGy). COMPLICATIONS: None immediate. PROCEDURE: Informed consent was obtained from the patient following explanation of the procedure, risks, benefits and alternatives. The patient understands, agrees and consents for the procedure. All questions were addressed. A time out was performed prior to the initiation of the procedure. Maximal barrier sterile technique utilized including caps, mask, sterile gowns, sterile gloves, large sterile drape, hand hygiene, and Betadine prep. The right groin was interrogated with ultrasound. The common femoral artery is widely patent. A small dermatotomy was made. Under real-time sonographic guidance, the common femoral artery was punctured with a 21 gauge micropuncture needle. An image was obtained and stored for the medical record. The micro needle was then exchanged over a micro wire for a transitional micro sheath. The micro sheath was then exchanged over a Bentson wire for a 5 [place] vascular sheath. A C2 Cobra catheter was advanced over the Bentson wire and positioned in the abdominal aorta. With the assistance of a Glidewire, the right renal artery was catheterized. A right renal arteriogram was performed. Road maps of the complex right renal artery aneurysm were then obtained. A Rosen wire was advanced into the renal artery. The cobra catheter and 5 French sheath were removed. A 6 French destination sheath was advanced into the renal artery. Coaxially through this, a 5 [place] catheter was carefully navigated through the first renal artery aneurysm and into the second renal artery aneurysm. Utilizing a double angle Headhunter and Fathom 16 micro wire, the micro catheter was successfully advanced into a lower pole branch artery arising from the base of the second renal artery aneurysm. Once distal access was safely secured, a 5.5 x 350 mm pipeline flow diverting stent was deployed across the  necks of both aneurysms. Follow-up arteriography demonstrated complete patency of the main renal artery and branch renal arteries. There is full perfusion of the renal parenchyma on the nephrographic phase. There is stagnation of contrast material in the more proximal and smaller of the 2 aneurysms. However, contrast material washes out rapidly from the larger renal artery aneurysm. Therefore, the decision was made to place a second pipeline stent coaxially within the first in an effort to provide greater metallic coverage and further decrease flow into the aneurysm sac. The micro catheter and wire were re- advanced into the lower pole branch artery coaxially through the first pipeline stent. A second 5.5 x 180 mm stent was then deployed across the necks of the aneurysms. Follow-up angiography again confirmed complete patency of the main renal artery and branch vessels. There is complete perfusion of the renal parenchyma on the nephrographic phase. There is now stagnation of injected contrast material within both aneurysm sacs. Overall, this is an excellent angiographic result. The wires and catheters were removed. A limited right common femoral arteriogram was performed confirming common femoral arterial access. Hemostasis was attained with the assistance of a 6 [place] Cordis Exoseal device. IMPRESSION: Successful deployment of pipeline flow diverting stent across the necks of a both right-sided renal artery aneurysms with decreased perfusion of the aneurysm sacs and stagnation of injected contrast material. PLAN: 1. Admit for observation, anticipate discharge in the morning. 2.  Continue aspirin and Plavix for now. 3. Clinic visit in 2-4 weeks with BMP. 4. Follow-up renal artery angiogram in 3 months. Signed, Criselda Peaches, MD [facility] Radiology Electronically Signed   By: Jacqulynn Cadet M.D.   On: 01/18/2016 16:43   Ir Angiogram Selective Each  Additional Vessel  01/18/2016  INDICATION: 55 year old female with a bilobed right renal artery aneurysm. The larger lobe measures up to 2.4 cm in diameter which is of a size concerning for spontaneous rupture. She presents today for renal angiogram and attempted endovascular repair by placement of flow diverting pipeline endovascular stents. EXAM: IR EMBO ARTERIAL NOT HEMORR HEMANG INC GUIDE ROADMAPPING; IR ULTRASOUND GUIDANCE VASC ACCESS RIGHT; ARTERIOGRAPHY; ADDITIONAL ARTERIOGRAPHY; IR RENAL SUPRASEL UNILATERAL S+I MODERATE SEDATION MEDICATIONS: 2 g Ancef. The antibiotic was administered within 1 hour of the procedure ANESTHESIA/SEDATION: General anesthesia was provided by the anesthesiology service. CONTRAST:  100 mL FLUOROSCOPY TIME:  Fluoroscopy Time: 87 minutes 42 seconds (2735 mGy). COMPLICATIONS: None immediate. PROCEDURE: Informed consent was obtained from the patient following explanation of the procedure, risks, benefits and alternatives. The patient understands, agrees and consents for the procedure. All questions were addressed. A time out was performed prior to the initiation of the procedure. Maximal barrier sterile technique utilized including caps, mask, sterile gowns, sterile gloves, large sterile drape, hand hygiene, and Betadine prep. The right groin was interrogated with ultrasound. The common femoral artery is widely patent. A small dermatotomy was made. Under real-time sonographic guidance, the common femoral artery was punctured with a 21 gauge micropuncture needle. An image was obtained and stored for the medical record. The micro needle was then exchanged over a micro wire for a transitional micro sheath. The micro sheath was then exchanged over a Bentson wire for a 5 [place] vascular sheath. A C2 Cobra catheter was advanced over the Bentson wire and positioned in the abdominal aorta. With the assistance of a Glidewire, the right renal artery was catheterized. A right renal arteriogram  was performed. Road maps of the complex right renal artery aneurysm were then obtained. A Rosen wire was advanced into the renal artery. The cobra catheter and 5 French sheath were removed. A 6 French destination sheath was advanced into the renal artery. Coaxially through this, a 5 [place] catheter was carefully navigated through the first renal artery aneurysm and into the second renal artery aneurysm. Utilizing a double angle Headhunter and Fathom 16 micro wire, the micro catheter was successfully advanced into a lower pole branch artery arising from the base of the second renal artery aneurysm. Once distal access was safely secured, a 5.5 x 350 mm pipeline flow diverting stent was deployed across the necks of both aneurysms. Follow-up arteriography demonstrated complete patency of the main renal artery and branch renal arteries. There is full perfusion of the renal parenchyma on the nephrographic phase. There is stagnation of contrast material in the more proximal and smaller of the 2 aneurysms. However, contrast material washes out rapidly from the larger renal artery aneurysm. Therefore, the decision was made to place a second pipeline stent coaxially within the first in an effort to provide greater metallic coverage and further decrease flow into the aneurysm sac. The micro catheter and wire were re- advanced into the lower pole branch artery coaxially through the first pipeline stent. A second 5.5 x 180 mm stent was then deployed across the necks of the aneurysms. Follow-up angiography again confirmed complete patency of the main renal artery and branch  vessels. There is complete perfusion of the renal parenchyma on the nephrographic phase. There is now stagnation of injected contrast material within both aneurysm sacs. Overall, this is an excellent angiographic result. The wires and catheters were removed. A limited right common femoral arteriogram was performed confirming common femoral arterial access.  Hemostasis was attained with the assistance of a 6 [place] Cordis Exoseal device. IMPRESSION: Successful deployment of pipeline flow diverting stent across the necks of a both right-sided renal artery aneurysms with decreased perfusion of the aneurysm sacs and stagnation of injected contrast material. PLAN: 1. Admit for observation, anticipate discharge in the morning. 2. Continue aspirin and Plavix for now. 3. Clinic visit in 2-4 weeks with BMP. 4. Follow-up renal artery angiogram in 3 months. Signed, Criselda Peaches, MD [facility] Radiology Electronically Signed   By: Jacqulynn Cadet M.D.   On: 01/18/2016 16:43   Ir Angiogram Follow Up Study  01/18/2016  INDICATION: 55 year old female with a bilobed right renal artery aneurysm. The larger lobe measures up to 2.4 cm in diameter which is of a size concerning for spontaneous rupture. She presents today for renal angiogram and attempted endovascular repair by placement of flow diverting pipeline endovascular stents. EXAM: IR EMBO ARTERIAL NOT HEMORR HEMANG INC GUIDE ROADMAPPING; IR ULTRASOUND GUIDANCE VASC ACCESS RIGHT; ARTERIOGRAPHY; ADDITIONAL ARTERIOGRAPHY; IR RENAL SUPRASEL UNILATERAL S+I MODERATE SEDATION MEDICATIONS: 2 g Ancef. The antibiotic was administered within 1 hour of the procedure ANESTHESIA/SEDATION: General anesthesia was provided by the anesthesiology service. CONTRAST:  100 mL FLUOROSCOPY TIME:  Fluoroscopy Time: 87 minutes 42 seconds (2735 mGy). COMPLICATIONS: None immediate. PROCEDURE: Informed consent was obtained from the patient following explanation of the procedure, risks, benefits and alternatives. The patient understands, agrees and consents for the procedure. All questions were addressed. A time out was performed prior to the initiation of the procedure. Maximal barrier sterile technique utilized including caps, mask, sterile gowns, sterile gloves, large sterile drape, hand hygiene,  and Betadine prep. The right groin was interrogated with ultrasound. The common femoral artery is widely patent. A small dermatotomy was made. Under real-time sonographic guidance, the common femoral artery was punctured with a 21 gauge micropuncture needle. An image was obtained and stored for the medical record. The micro needle was then exchanged over a micro wire for a transitional micro sheath. The micro sheath was then exchanged over a Bentson wire for a 5 [place] vascular sheath. A C2 Cobra catheter was advanced over the Bentson wire and positioned in the abdominal aorta. With the assistance of a Glidewire, the right renal artery was catheterized. A right renal arteriogram was performed. Road maps of the complex right renal artery aneurysm were then obtained. A Rosen wire was advanced into the renal artery. The cobra catheter and 5 French sheath were removed. A 6 French destination sheath was advanced into the renal artery. Coaxially through this, a 5 [place] catheter was carefully navigated through the first renal artery aneurysm and into the second renal artery aneurysm. Utilizing a double angle Headhunter and Fathom 16 micro wire, the micro catheter was successfully advanced into a lower pole branch artery arising from the base of the second renal artery aneurysm. Once distal access was safely secured, a 5.5 x 350 mm pipeline flow diverting stent was deployed across the necks of both aneurysms. Follow-up arteriography demonstrated complete patency of the main renal artery and branch renal arteries. There is full perfusion of the renal parenchyma on the nephrographic phase. There is stagnation  of contrast material in the more proximal and smaller of the 2 aneurysms. However, contrast material washes out rapidly from the larger renal artery aneurysm. Therefore, the decision was made to place a second pipeline stent coaxially within the first in an effort to provide greater metallic coverage and further  decrease flow into the aneurysm sac. The micro catheter and wire were re- advanced into the lower pole branch artery coaxially through the first pipeline stent. A second 5.5 x 180 mm stent was then deployed across the necks of the aneurysms. Follow-up angiography again confirmed complete patency of the main renal artery and branch vessels. There is complete perfusion of the renal parenchyma on the nephrographic phase. There is now stagnation of injected contrast material within both aneurysm sacs. Overall, this is an excellent angiographic result. The wires and catheters were removed. A limited right common femoral arteriogram was performed confirming common femoral arterial access. Hemostasis was attained with the assistance of a 6 [place] Cordis Exoseal device. IMPRESSION: Successful deployment of pipeline flow diverting stent across the necks of a both right-sided renal artery aneurysms with decreased perfusion of the aneurysm sacs and stagnation of injected contrast material. PLAN: 1. Admit for observation, anticipate discharge in the morning. 2. Continue aspirin and Plavix for now. 3. Clinic visit in 2-4 weeks with BMP. 4. Follow-up renal artery angiogram in 3 months. Signed, Criselda Peaches, MD [facility] Radiology Electronically Signed   By: Jacqulynn Cadet M.D.   On: 01/18/2016 16:43   Ir US Guide Vasc Access Right  01/18/2016  INDICATION: 55 year old female with a bilobed right renal artery aneurysm. The larger lobe measures up to 2.4 cm in diameter which is of a size concerning for spontaneous rupture. She presents today for renal angiogram and attempted endovascular repair by placement of flow diverting pipeline endovascular stents. EXAM: IR EMBO ARTERIAL NOT HEMORR HEMANG INC GUIDE ROADMAPPING; IR ULTRASOUND GUIDANCE VASC ACCESS RIGHT; ARTERIOGRAPHY; ADDITIONAL ARTERIOGRAPHY; IR RENAL SUPRASEL UNILATERAL S+I MODERATE SEDATION MEDICATIONS: 2 g  Ancef. The antibiotic was administered within 1 hour of the procedure ANESTHESIA/SEDATION: General anesthesia was provided by the anesthesiology service. CONTRAST:  100 mL FLUOROSCOPY TIME:  Fluoroscopy Time: 87 minutes 42 seconds (2735 mGy). COMPLICATIONS: None immediate. PROCEDURE: Informed consent was obtained from the patient following explanation of the procedure, risks, benefits and alternatives. The patient understands, agrees and consents for the procedure. All questions were addressed. A time out was performed prior to the initiation of the procedure. Maximal barrier sterile technique utilized including caps, mask, sterile gowns, sterile gloves, large sterile drape, hand hygiene, and Betadine prep. The right groin was interrogated with ultrasound. The common femoral artery is widely patent. A small dermatotomy was made. Under real-time sonographic guidance, the common femoral artery was punctured with a 21 gauge micropuncture needle. An image was obtained and stored for the medical record. The micro needle was then exchanged over a micro wire for a transitional micro sheath. The micro sheath was then exchanged over a Bentson wire for a 5 [place] vascular sheath. A C2 Cobra catheter was advanced over the Bentson wire and positioned in the abdominal aorta. With the assistance of a Glidewire, the right renal artery was catheterized. A right renal arteriogram was performed. Road maps of the complex right renal artery aneurysm were then obtained. A Rosen wire was advanced into the renal artery. The cobra catheter and 5 French sheath were removed. A 6 French destination sheath was advanced into the renal artery. Coaxially  through this, a 5 [place] catheter was carefully navigated through the first renal artery aneurysm and into the second renal artery aneurysm. Utilizing a double angle Headhunter and Fathom 16 micro wire, the micro catheter was successfully advanced into a lower pole branch artery arising from the  base of the second renal artery aneurysm. Once distal access was safely secured, a 5.5 x 350 mm pipeline flow diverting stent was deployed across the necks of both aneurysms. Follow-up arteriography demonstrated complete patency of the main renal artery and branch renal arteries. There is full perfusion of the renal parenchyma on the nephrographic phase. There is stagnation of contrast material in the more proximal and smaller of the 2 aneurysms. However, contrast material washes out rapidly from the larger renal artery aneurysm. Therefore, the decision was made to place a second pipeline stent coaxially within the first in an effort to provide greater metallic coverage and further decrease flow into the aneurysm sac. The micro catheter and wire were re- advanced into the lower pole branch artery coaxially through the first pipeline stent. A second 5.5 x 180 mm stent was then deployed across the necks of the aneurysms. Follow-up angiography again confirmed complete patency of the main renal artery and branch vessels. There is complete perfusion of the renal parenchyma on the nephrographic phase. There is now stagnation of injected contrast material within both aneurysm sacs. Overall, this is an excellent angiographic result. The wires and catheters were removed. A limited right common femoral arteriogram was performed confirming common femoral arterial access. Hemostasis was attained with the assistance of a 6 [place] Cordis Exoseal device. IMPRESSION: Successful deployment of pipeline flow diverting stent across the necks of a both right-sided renal artery aneurysms with decreased perfusion of the aneurysm sacs and stagnation of injected contrast material. PLAN: 1. Admit for observation, anticipate discharge in the morning. 2. Continue aspirin and Plavix for now. 3. Clinic visit in 2-4 weeks with BMP. 4. Follow-up renal artery angiogram in 3 months. Signed, Criselda Peaches, MD [facility] Radiology Electronically Signed   By: Jacqulynn Cadet M.D.   On: 01/18/2016 16:43   Ir Embo Arterial Not [place] Roadmapping  01/18/2016  INDICATION: 55 year old female with a bilobed right renal artery aneurysm. The larger lobe measures up to 2.4 cm in diameter which is of a size concerning for spontaneous rupture. She presents today for renal angiogram and attempted endovascular repair by placement of flow diverting pipeline endovascular stents. EXAM: IR EMBO ARTERIAL NOT HEMORR HEMANG INC GUIDE ROADMAPPING; IR ULTRASOUND GUIDANCE VASC ACCESS RIGHT; ARTERIOGRAPHY; ADDITIONAL ARTERIOGRAPHY; IR RENAL SUPRASEL UNILATERAL S+I MODERATE SEDATION MEDICATIONS: 2 g Ancef. The antibiotic was administered within 1 hour of the procedure ANESTHESIA/SEDATION: General anesthesia was provided by the anesthesiology service. CONTRAST:  100 mL FLUOROSCOPY TIME:  Fluoroscopy Time: 87 minutes 42 seconds (2735 mGy). COMPLICATIONS: None immediate. PROCEDURE: Informed consent was obtained from the patient following explanation of the procedure, risks, benefits and alternatives. The patient understands, agrees and consents for the procedure. All questions were addressed. A time out was performed prior to the initiation of the procedure. Maximal barrier sterile technique utilized including caps, mask, sterile gowns, sterile gloves, large sterile drape, hand hygiene, and Betadine prep. The right groin was interrogated with ultrasound. The common femoral artery is widely patent. A small dermatotomy was made. Under real-time sonographic guidance, the common femoral artery was punctured with a 21 gauge micropuncture needle. An image was obtained and stored for the  medical record. The micro needle was then exchanged over a micro wire for a transitional micro sheath. The micro sheath was then exchanged over a Bentson wire for a 5 [place] vascular sheath. A C2 Cobra catheter was advanced over  the Bentson wire and positioned in the abdominal aorta. With the assistance of a Glidewire, the right renal artery was catheterized. A right renal arteriogram was performed. Road maps of the complex right renal artery aneurysm were then obtained. A Rosen wire was advanced into the renal artery. The cobra catheter and 5 French sheath were removed. A 6 French destination sheath was advanced into the renal artery. Coaxially through this, a 5 [place] catheter was carefully navigated through the first renal artery aneurysm and into the second renal artery aneurysm. Utilizing a double angle Headhunter and Fathom 16 micro wire, the micro catheter was successfully advanced into a lower pole branch artery arising from the base of the second renal artery aneurysm. Once distal access was safely secured, a 5.5 x 350 mm pipeline flow diverting stent was deployed across the necks of both aneurysms. Follow-up arteriography demonstrated complete patency of the main renal artery and branch renal arteries. There is full perfusion of the renal parenchyma on the nephrographic phase. There is stagnation of contrast material in the more proximal and smaller of the 2 aneurysms. However, contrast material washes out rapidly from the larger renal artery aneurysm. Therefore, the decision was made to place a second pipeline stent coaxially within the first in an effort to provide greater metallic coverage and further decrease flow into the aneurysm sac. The micro catheter and wire were re- advanced into the lower pole branch artery coaxially through the first pipeline stent. A second 5.5 x 180 mm stent was then deployed across the necks of the aneurysms. Follow-up angiography again confirmed complete patency of the main renal artery and branch vessels. There is complete perfusion of the renal parenchyma on the nephrographic phase. There is now stagnation of injected contrast material within both aneurysm sacs. Overall, this is an excellent  angiographic result. The wires and catheters were removed. A limited right common femoral arteriogram was performed confirming common femoral arterial access. Hemostasis was attained with the assistance of a 6 [place] Cordis Exoseal device. IMPRESSION: Successful deployment of pipeline flow diverting stent across the necks of a both right-sided renal artery aneurysms with decreased perfusion of the aneurysm sacs and stagnation of injected contrast material. PLAN: 1. Admit for observation, anticipate discharge in the morning. 2. Continue aspirin and Plavix for now. 3. Clinic visit in 2-4 weeks with BMP. 4. Follow-up renal artery angiogram in 3 months. Signed, Criselda Peaches, MD [facility] Rad[facility] Electronically Signed   By: Jacqulynn Cadet M.D.   On: 01/18/2016 16:43    Labs:  CBC:  Recent Labs  12/08/15 1530 01/04/16 1100 01/18/16 0657 01/19/16 0536  WBC 4.9 3.7* 4.4 4.6  HGB 12.3 11.9* 11.6* 10.5*  HCT 38.5 37.8 37.7 33.1*  PLT 236 223 190 184    COAGS:  Recent Labs  01/04/16 1100 01/18/16 0657  INR 1.09 1.17  APTT 30 31    BMP:  Recent Labs  11/01/15 0739 01/04/16 1100 01/18/16 0657 01/19/16 0536  NA 140 140 140 141  K 3.5 3.3* 2.9* 3.1*  CL 97* 100* 102 102  CO2 38* 32 26 29  GLUCOSE 90 93 90 99  BUN 10 12 9 7   CALCIUM 9.5 9.4 9.2 8.5*  CREATININE 0.69 0.72  0.61 0.66  GFRNONAA >89 >60 >60 >60  GFRAA >89 >60 >60 >60    LIVER FUNCTION TESTS:  Recent Labs  04/14/15 0850 11/01/15 0739  BILITOT 0.6 0.7  AST 25 32  ALT 29 32*  ALKPHOS 89 97  PROT 6.9 7.3  ALBUMIN 3.9 4.1    Assessment and Plan:  Will discuss with Dr Laurence Ferrari Plan for probable dc to home today  Electronically Signed: Luka Reisch A 01/19/2016, 7:19 AM   I spent a total of 15 Minutes at the the patient's bedside AND on the patient's hospital floor or unit, greater than 50% of which was counseling/coordinating care for rt  renal artery aneurysm embolization

## 2016-01-19 NOTE — Discharge Summary (Signed)
Patient ID: LOVELLE HAUSKNECHT MRN: [id number] DOB/AGE: 1961/02/24 55 y.o.  Admit date: 01/18/2016 Discharge date: 01/19/2016  Supervising Physician: Jacqulynn Cadet  Admission Diagnoses: Right renal artery aneurysm  Discharge Diagnoses:  Active Problems:   Renal artery aneurysm [facility])   Discharged Condition: stable/improved  Hospital Course: pt was referred to Dr Ancil Linsey for abnormal axillary lymph nodes. Work up revealed incidental Rt renal artery aneurysm Was referred to Dr Jacqulynn Cadet for evaluation and possible treatment. Embolization with pipeline stent x 2 was performed in IR 01/18/2016. Procedure was without complication Overnight stay for observation was without event. I have seen and examined pt. Slight headache; resolving She has eaten - no N/V She has ambulated Slept well Urinated on own. Passing gas I have discussed status with Dr Laurence Ferrari Plan for discharge to home Follow up 2-4 weeks in clinic  Consults: None  Significant Diagnostic Studies: Renal arteriogram  Treatments: Procedure: Endovascular repair of complex right renal artery aneurysm. Pipeline flow diverting stent x2  Discharge Exam: Blood pressure 129/82, pulse 80, temperature 98.5 F (36.9 C), temperature source Oral, resp. rate 18, last menstrual period 01/04/2016, SpO2 95 %.   PE: A/O; appropriate [place]: RRR Lungs: CTA Abd: soft +BS NT Ext: FROM Ambulating well Rt groin NT no bleeding; no hematoma;soft Rt foot 2+ pulses  Results for orders placed or performed during the hospital encounter of 01/18/16  CBC  Result Value Ref Range   WBC 4.6 4.0 - 10.5 K/uL   RBC 3.66 (L) 3.87 - 5.11 MIL/uL   Hemoglobin 10.5 (L) 12.0 - 15.0 g/dL   HCT 33.1 (L) 36.0 - 46.0 %   MCV 90.4 78.0 - 100.0 fL   MCH 28.7 26.0 - 34.0 pg   MCHC 31.7 30.0 - 36.0 g/dL   RDW 13.7 11.5 - 15.5 %   Platelets 184 150 - 400 K/uL  Basic metabolic panel  Result Value Ref Range   Sodium  141 135 - 145 mmol/L   Potassium 3.1 (L) 3.5 - 5.1 mmol/L   Chloride 102 101 - 111 mmol/L   CO2 29 22 - 32 mmol/L   Glucose, Bld 99 65 - 99 mg/dL   BUN 7 6 - 20 mg/dL   Creatinine, Ser 0.66 0.44 - 1.00 mg/dL   Calcium 8.5 (L) 8.9 - 10.3 mg/dL   GFR calc non Af Amer >60 >60 mL/min   GFR calc Af Amer >60 >60 mL/min   Anion gap 10 5 - 15   Bun/Cr 4/12:  7/0.66  Disposition: Right renal artery aneurysm embolization with pipeline stent x 2 Dr Laurence Ferrari in IR 01/18/16 Tolerated well and without complication Now for discharge home She has good understanding of all dc instruction Resume all home meds To follow up with Dr Laurence Ferrari 2-4 week in clinic (pt will hear from scheduler for time and date)   Discharge Instructions    Call MD for:  difficulty breathing, headache or visual disturbances    Complete by:  As directed      Call MD for:  extreme fatigue    Complete by:  As directed      Call MD for:  hives    Complete by:  As directed      Call MD for:  persistant dizziness or light-headedness    Complete by:  As directed      Call MD for:  persistant nausea and vomiting    Complete by:  As directed      Call  MD for:  redness, tenderness, or signs of infection (pain, swelling, redness, odor or green/yellow discharge around incision site)    Complete by:  As directed      Call MD for:  severe uncontrolled pain    Complete by:  As directed      Call MD for:  temperature >100.4    Complete by:  As directed      Diet - low sodium heart healthy    Complete by:  As directed      Discharge instructions    Complete by:  As directed   Follow up 2-4 weeks in clinic with Dr Laurence Ferrari; pt will hear from clinic for time and date; resume all home meds; call [phone] if questions or concerns     Discharge wound care:    Complete by:  As directed   May shower today; keep clean band aid on Rt groin daily x 1 week     Driving Restrictions    Complete by:  As directed   No driving until  [id number]     Increase activity slowly    Complete by:  As directed      Lifting restrictions    Complete by:  As directed   No lifting over 10 lbs x 1 week            Medication List    TAKE these medications        acyclovir 800 MG tablet  Commonly known as:  ZOVIRAX  Take 1 tablet (800 mg total) by mouth 2 (two) times daily.     ALEVE 220 MG tablet  Generic drug:  naproxen sodium  Take 220 mg by mouth 2 (two) times daily as needed (menstrual cramps).     amLODipine 5 MG tablet  Commonly known as:  NORVASC  TAKE 1 TABLET BY MOUTH EVERY DAY     aspirin 325 MG tablet  Take 325 mg by mouth daily.     atorvastatin 20 MG tablet  Commonly known as:  LIPITOR  TAKE 1 TABLET BY MOUTH EVERY DAY     azelastine 0.1 % nasal spray  Commonly known as:  ASTELIN  Place 2 sprays into both nostrils 2 (two) times daily. Use in each nostril as directed     clopidogrel 75 MG tablet  Commonly known as:  PLAVIX  Take 75 mg by mouth daily.     fluticasone 50 MCG/ACT nasal spray  Commonly known as:  FLONASE  PLACE 2 SPRAYS INTO BOTH NOSTRILS DAILY.     loratadine 10 MG tablet  Commonly known as:  CLARITIN  Take 10 mg by mouth daily.     omeprazole 20 MG capsule  Commonly known as:  PRILOSEC  TAKE ONE CAPSULE BY MOUTH EVERY DAY AS NEEDED     triamterene-hydrochlorothiazide 75-50 MG tablet  Commonly known as:  MAXZIDE  TAKE 1 TABLET BY MOUTH EVERY DAY           Follow-up Information    Follow up with [facility], [place], [place] In 3 weeks.   Specialties:  Interventional Radiology, Radiology   Why:  pt will hear from clinic for time and date of follow up appt   Contact information:   [address] [place] [address] [phone]        Electronically Signed: Monia Sabal A 01/19/2016, 9:45 AM   I have spent Greater Than 30 Minutes discharging [place].

## 2016-01-19 NOTE — Progress Notes (Signed)
Received orders for pt to be D/C.  Tele removed, CCMD notified.

## 2016-01-19 NOTE — Progress Notes (Signed)
Pt assessment complete, IV was discontinued last night due to discomfort.  Pt is expected to be D/C today, no IV med orders at this time.  Holding off on restarting IV access unless needed prior to D/C.

## 2016-01-19 NOTE — Anesthesia Postprocedure Evaluation (Signed)
Anesthesia Post Note  Patient: Allison Hart  Procedure(s) Performed: Procedure(s) (LRB): RADIOLOGY WITH ANESTHESIA (N/A)  Patient location during evaluation: PACU Anesthesia Type: General Level of consciousness: awake and alert Pain management: pain level controlled Vital Signs Assessment: post-procedure vital signs reviewed and stable Respiratory status: spontaneous breathing, nonlabored ventilation, respiratory function stable and patient connected to nasal cannula oxygen Cardiovascular status: blood pressure returned to baseline and stable Postop Assessment: no signs of nausea or vomiting Anesthetic complications: no    Last Vitals:  Filed Vitals:   01/18/16 1900 01/19/16 0405  BP: 119/85 129/82  Pulse: 89 80  Temp: 37 C 36.9 C  Resp: 18 18    Last Pain:  Filed Vitals:   01/19/16 1154  PainSc: 0-No pain                 Moni Rothrock,JAMES TERRILL

## 2016-01-29 LAB — BASIC METABOLIC PANEL WITH GFR
BUN: 13 mg/dL (ref 7–25)
CALCIUM: 9 mg/dL (ref 8.6–10.4)
CHLORIDE: 102 mmol/L (ref 98–110)
CO2: 30 mmol/L (ref 20–31)
CREATININE: 0.75 mg/dL (ref 0.50–1.05)
GFR, Est Non African American: >89 mL/min (ref >=60)
Glucose, Bld: 114 mg/dL — ABNORMAL HIGH (ref 65–99)
Potassium: 3.8 mmol/L (ref 3.5–5.3)
SODIUM: 141 mmol/L (ref 135–146)

## 2016-02-03 ENCOUNTER — Ambulatory Visit
Admission: RE | Admit: 2016-02-03 | Discharge: 2016-02-03 | Disposition: A | Discharge status: Home / Self Care | Payer: BLUE CROSS/BLUE SHIELD | Source: Ambulatory Visit | Attending: Radiology | Admitting: Radiology

## 2016-02-03 ENCOUNTER — Other Ambulatory Visit: Payer: Self-pay | Admitting: Interventional Radiology

## 2016-02-03 DIAGNOSIS — I722 Aneurysm of renal artery: Secondary | ICD-10-CM

## 2016-02-03 NOTE — Progress Notes (Signed)
Chief Complaint: Patient was seen in consultation today for  Chief Complaint  Patient presents with  . Follow-up    2 wk follow up Right Renal Artery Embolization     at the request of [place]  Referring Physician(s): Turpin,Pamela  History of Present Illness: Allison Hart is a 55 y.o. female now 2 weeks status post placement of flow diverting stents across the neck of her complex right renal artery aneurysm.  Her postoperative course has been relatively uneventful. She has had some mild fatigue post anesthesia but this is not significant. She denies flank pain, dysuria, hematuria, fever or chills.  Her right groin puncture site is completely healed and no longer visible. However, she does note that a few weeks after her diagnostic renal angiogram which was performed about a month ago she developed some a pustule at the site of her radial artery access. This spontaneously decompressed and has now nearly resolved. She has only a mild hyperpigmented scar at the site.  Otherwise, she is doing very well.   Past Medical History  Diagnosis Date  . GERD (gastroesophageal reflux disease)   . Essential hypertension, benign   . Obesity   . Murmur   . Hypercholesteremia   . Herpes   . Sinusitis   . Urinary frequency     Past Surgical History  Procedure Laterality Date  . Umbilical hernia repair  infancy   . Left inguinal hernia repair and endometriosis  2007    No definitive records   . Left parotid bx benign  2005  . Embolization of uterine artery x 2    . Colonoscopy  11/13/2012    Procedure: COLONOSCOPY;  Surgeon: Rogene Houston, MD;  [facility];  Service: Endoscopy;  Laterality: N/A;  930  . Radiology with anesthesia N/A 01/18/2016    Procedure: RADIOLOGY WITH ANESTHESIA;  Surgeon: Luanne Bras, MD;  Location: [place];  Service: Radiology;  Laterality: N/A;    Allergies: Versed  Medications: Prior to Admission medications   Medication Sig  Start Date End Date Taking? Authorizing Provider  amLODipine (NORVASC) 5 MG tablet TAKE 1 TABLET BY MOUTH EVERY DAY Patient taking differently: TAKE 1 TABLET BY MOUTH EVERY DAY AT BEDTIME 10/18/15  Yes Fayrene Helper, MD  aspirin 325 MG tablet Take 325 mg by mouth daily.   Yes Historical Provider, MD  atorvastatin (LIPITOR) 20 MG tablet TAKE 1 TABLET BY MOUTH EVERY DAY 12/27/15  Yes Fayrene Helper, MD  azelastine (ASTELIN) 0.1 % nasal spray Place 2 sprays into both nostrils 2 (two) times daily. Use in each nostril as directed Patient taking differently: Place 2 sprays into both nostrils 2 (two) times daily as needed (chronic sinusitis). Use in each nostril as directed 11/09/15  Yes Fayrene Helper, MD  clopidogrel (PLAVIX) 75 MG tablet Take 75 mg by mouth daily.   Yes Historical Provider, MD  fluticasone (FLONASE) 50 MCG/ACT nasal spray PLACE 2 SPRAYS INTO BOTH NOSTRILS DAILY. 11/08/15  Yes Fayrene Helper, MD  loratadine (CLARITIN) 10 MG tablet Take 10 mg by mouth daily.   Yes Historical Provider, MD  naproxen sodium (ALEVE) 220 MG tablet Take 220 mg by mouth 2 (two) times daily as needed (menstrual cramps).   Yes Historical Provider, MD  triamterene-hydrochlorothiazide (MAXZIDE) 75-50 MG tablet TAKE 1 TABLET BY MOUTH EVERY DAY 11/05/15  Yes Fayrene Helper, MD  acyclovir (ZOVIRAX) 800 MG tablet Take 1 tablet (800 mg total) by mouth 2 (two)  times daily. Patient not taking: Reported on 02/03/2016 10/26/14   Fayrene Helper, MD  omeprazole (PRILOSEC) 20 MG capsule TAKE ONE CAPSULE BY MOUTH EVERY DAY AS NEEDED Patient not taking: Reported on 02/03/2016 11/23/15   Fayrene Helper, MD     Family History  Problem Relation Age of Onset  . Hypertension Mother   . Hyperlipidemia Mother   . Thyroid disease Mother   . Coronary artery disease Mother   . Irregular heart beat Mother   . Fibroids Sister   . Hypertension Sister   . Ulcerative colitis Son   . Colon cancer Neg Hx      Social History   Social History  . Marital Status: Married    Spouse Name: N/A  . Number of Children: N/A  . Years of Education: N/A   Social History Main Topics  . Smoking status: Former Smoker -- 0.25 packs/day for 5 years    Types: Cigarettes  . Smokeless tobacco: Never Used     Comment: "15-20 years ago" 01/13/16  . Alcohol Use: Yes     Comment: occasionally  . Drug Use: No  . Sexual Activity: Not on file   Other Topics Concern  . Not on file   Social History Narrative    Review of Systems: A 12 point ROS discussed and pertinent positives are indicated in the HPI above.  All other systems are negative.  Review of Systems  Vital Signs: BP 140/92 mmHg  Pulse 70  Temp(Src) 98.4 F (36.9 C) (Oral)  Resp 14  SpO2 97%  LMP 01/04/2016 (Approximate)  Physical Exam  Constitutional: She is oriented to person, place, and time. She appears well-developed and well-nourished. No distress.  HENT:  Head: Normocephalic and atraumatic.  Eyes: No scleral icterus.  Cardiovascular: Normal rate and regular rhythm.   Pulmonary/Chest: Effort normal and breath sounds normal.  Abdominal: Soft. She exhibits no distension. There is no tenderness.  Neurological: She is alert and oriented to person, place, and time.  Skin: Skin is warm and dry.  Small scar at site of left radial access.  No drainage, tenderness or fluctuance.   Psychiatric: She has a normal mood and affect. Her behavior is normal.  Nursing note and vitals reviewed.    Imaging:   Labs:  CBC:  Recent Labs  12/08/15 1530 01/04/16 1100 01/18/16 0657 01/19/16 0536  WBC 4.9 3.7* 4.4 4.6  HGB 12.3 11.9* 11.6* 10.5*  HCT 38.5 37.8 37.7 33.1*  PLT 236 223 190 184    COAGS:  Recent Labs  01/04/16 1100 01/18/16 0657  INR 1.09 1.17  APTT 30 31    BMP:  Recent Labs  01/04/16 1100 01/18/16 0657 01/19/16 0536 01/28/16 1529  NA 140 140 141 141  K 3.3* 2.9* 3.1* 3.8  CL 100* 102 102 102  CO2 32  26 29 30   GLUCOSE 93 90 99 114*  BUN 12 9 7 13   CALCIUM 9.4 9.2 8.5* 9.0  CREATININE 0.72 0.61 0.66 0.75  GFRNONAA >60 >60 >60 >89  GFRAA >60 >60 >60 >89    LIVER FUNCTION TESTS:  Recent Labs  04/14/15 0850 11/01/15 0739  BILITOT 0.6 0.7  AST 25 32  ALT 29 32*  ALKPHOS 89 97  PROT 6.9 7.3  ALBUMIN 3.9 4.1    TUMOR MARKERS: No results for input(s): AFPTM, CEA, CA199, CHROMGRNA in the last 8760 hours.  Assessment and Plan:  Doing extremely well 2 weeks status post placement of flow diverting  stents across her complex bilobed right renal artery aneurysm.  No evidence of post procedural complication. Renal function looks good.  1.) Schedule for repeat diagnostic right renal arteriogram in 3 months time to evaluate aneurysm morphology.   Thank you for this interesting consult.  I greatly enjoyed meeting Allison Hart and look forward to participating in their care.  A copy of this report was sent to the requesting provider on this date.  Electronically Signed: Jacqulynn Cadet 02/03/2016, 3:42 PM   I spent a total of  10 Minutes in face to face in clinical consultation, greater than 50% of which was counseling/coordinating care for right renal artery aneurysms.

## 2016-02-06 ENCOUNTER — Other Ambulatory Visit: Payer: Self-pay | Admitting: Family Medicine

## 2016-02-12 ENCOUNTER — Other Ambulatory Visit: Payer: Self-pay | Admitting: Family Medicine

## 2016-03-04 ENCOUNTER — Other Ambulatory Visit: Payer: Self-pay | Admitting: Family Medicine

## 2016-03-15 ENCOUNTER — Ambulatory Visit (HOSPITAL_COMMUNITY)
Admission: RE | Admit: 2016-03-15 | Discharge: 2016-03-15 | Disposition: A | Discharge status: Home / Self Care | Payer: BLUE CROSS/BLUE SHIELD | Source: Ambulatory Visit | Attending: Hematology & Oncology | Admitting: Hematology & Oncology

## 2016-03-15 DIAGNOSIS — R591 Generalized enlarged lymph nodes: Secondary | ICD-10-CM | POA: Diagnosis not present

## 2016-03-15 DIAGNOSIS — R938 Abnormal findings on diagnostic imaging of other specified body structures: Secondary | ICD-10-CM | POA: Diagnosis not present

## 2016-03-15 DIAGNOSIS — I722 Aneurysm of renal artery: Secondary | ICD-10-CM | POA: Insufficient documentation

## 2016-03-15 DIAGNOSIS — I7789 Other specified disorders of arteries and arterioles: Secondary | ICD-10-CM | POA: Diagnosis not present

## 2016-03-15 DIAGNOSIS — I517 Cardiomegaly: Secondary | ICD-10-CM | POA: Insufficient documentation

## 2016-03-15 MED ORDER — IOPAMIDOL (ISOVUE-300) INJECTION 61%
75.0000 mL | Freq: Once | INTRAVENOUS | Status: AC | PRN
  Administered 2016-03-15: 75 mL via INTRAVENOUS

## 2016-03-21 ENCOUNTER — Other Ambulatory Visit (HOSPITAL_COMMUNITY): Payer: Self-pay | Admitting: Interventional Radiology

## 2016-03-21 ENCOUNTER — Encounter (HOSPITAL_COMMUNITY): Payer: Self-pay | Admitting: Lab

## 2016-03-21 ENCOUNTER — Encounter (HOSPITAL_COMMUNITY): Payer: BLUE CROSS/BLUE SHIELD | Attending: Hematology & Oncology | Admitting: Hematology & Oncology

## 2016-03-21 VITALS — BP 137/93 | HR 76 | Temp 98.7°F | Resp 18 | Wt 217.0 lb

## 2016-03-21 DIAGNOSIS — R590 Localized enlarged lymph nodes: Secondary | ICD-10-CM

## 2016-03-21 DIAGNOSIS — R9389 Abnormal findings on diagnostic imaging of other specified body structures: Secondary | ICD-10-CM

## 2016-03-21 DIAGNOSIS — R938 Abnormal findings on diagnostic imaging of other specified body structures: Secondary | ICD-10-CM | POA: Diagnosis not present

## 2016-03-21 DIAGNOSIS — R591 Generalized enlarged lymph nodes: Secondary | ICD-10-CM | POA: Insufficient documentation

## 2016-03-21 DIAGNOSIS — I722 Aneurysm of renal artery: Secondary | ICD-10-CM

## 2016-03-21 NOTE — Patient Instructions (Addendum)
[place] at [facility] Discharge Instructions  RECOMMENDATIONS MADE BY THE CONSULTANT AND ANY TEST RESULTS WILL BE SENT TO YOUR REFERRING PHYSICIAN.  Exam done and seen today by Dr. Whitney Muse We refer you to dr Arnoldo Morale for a biopsy  Return to see the doctor in one week  After biopsy Please call the clinic if you have any questions or concerns  Thank you for choosing [place] at [facility] to provide your oncology and hematology care.  To afford each patient quality time with our provider, please arrive at least 15 minutes before your scheduled appointment time.   Beginning January 23rd 2017 lab work for the [facility] will be done in the  Main lab at [facility] on 1st floor. If you have a lab appointment with the [place] please come in thru the  Main Entrance and check in at the main information desk  You need to re-schedule your appointment should you arrive 10 or more minutes late.  We strive to give you quality time with our providers, and arriving late affects you and other patients whose appointments are after yours.  Also, if you no show three or more times for appointments you may be dismissed from the clinic at the providers discretion.     Again, thank you for choosing [facility].  Our hope is that these requests will decrease the amount of time that you wait before being seen by our physicians.       _____________________________________________________________  Should you have questions after your visit to [facility], please contact our office at [phone] between the hours of 8:30 a.m. and 4:30 p.m.  Voicemails left after 4:30 p.m. will not be returned until the following business day.  For prescription refill requests, have your pharmacy contact our office.         Resources For Cancer Patients and their Caregivers ? American Cancer Society: Can assist with transportation, wigs,  general needs, runs Look Good Feel Better.        3231278978 ? Cancer Care: Provides financial assistance, online support groups, medication/co-pay assistance.  1-800-813-HOPE 8055406503) ? [place] Assists [place] cancer patients and their families through emotional , educational and financial support.  720-171-9202 ? [place] DSS Where to apply for food stamps, Medicaid and utility assistance. 6842618398 ? [facility]: Transportation to medical appointments. (249)367-0453 ? Social Security Administration: May apply for disability if have a Stage IV cancer. 310-008-1592 312-463-6182 ? [employer], Disability and Transit Services: Assists with nutrition, care and transit needs. [place] Support Programs: @10RELATIVEDAYS @ > Cancer Support Group  2nd Tuesday of the month 1pm-2pm, Journey Room  > Creative Journey  3rd Tuesday of the month 1130am-1pm, Journey Room  > Look Good Feel Better  1st Wednesday of the month 10am-12 noon, Journey Room (Call [place] to register [phone])

## 2016-03-21 NOTE — Progress Notes (Signed)
Referral sent to Dr Arnoldo Morale. Records faxed on 6/13.  appt 6/27 patient aware

## 2016-03-21 NOTE — Progress Notes (Signed)
Allison Hart at [facility] Note  Patient Care Team: Allison Helper, MD as PCP - General Allison Gala, MD as Consulting Physician (Otolaryngology)  CHIEF COMPLAINTS/PURPOSE OF CONSULTATION:  Lymphadenopathy Screening mammogram on 11/09/2015 with lymphadenopathy in the L axillae Bilateral breast U/S showing bilateral axillary adenopathy Pathology from Left LN biopsy (needle/core biopsy) on 11/23/2015 with normal flow cytometry, lymphoid hyperplasia CT chest on 03/15/2016 with similar number and size of bilateral axillary and subpectoral nodes, indolent lymphoproliferative process cannot be excluded. Consider imaging surveillance at 6-9 months to confirm ongoing stability.  HISTORY OF PRESENTING ILLNESS:  Allison Hart 55 y.o. female is here because of referral by Dr. Moshe Hart for Lymphadenopathy found on mammography. Needle core biopsy showed normal flow cytometry and lymphoid hyperplasia. Remainder of evaluation was unremarkable. Adenopathy persists.   Allison Hart returns to the [facility] alone today. 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 She notes that everything went great.  Her lymphadenopathy is still present, but she confirms that she feels good, looks good, is active, and eats well.  She follows up with Dr. Laurence Hart on her surgery in a month.   She feels that "I may as well just go ahead and do the excisional biopsy." She says "I ain't scared. I don't want to wait until a symptom. Just see what it is."  She says "40 is something, I tell you what. I stay in the doctor's office more than I stay at work!"  During the physical exam, she notes that her bowels are okay. She again denies B symptoms. She is fairly active.    MEDICAL HISTORY:  Past Medical History  Diagnosis Date  . GERD (gastroesophageal reflux disease)   . Essential hypertension, benign   . Obesity   . Murmur   . Hypercholesteremia   .  Herpes   . Sinusitis   . Urinary frequency     SURGICAL HISTORY: Past Surgical History  Procedure Laterality Date  . Umbilical hernia repair  infancy   . Left inguinal hernia repair and endometriosis  2007    No definitive records   . Left parotid bx benign  2005  . Embolization of uterine artery x 2    . Colonoscopy  11/13/2012    Procedure: COLONOSCOPY;  Surgeon: Rogene Houston, MD;  Location: [facility];  Service: Endoscopy;  Laterality: N/A;  930  . Radiology with anesthesia N/A 01/18/2016    Procedure: RADIOLOGY WITH ANESTHESIA;  Surgeon: Luanne Bras, MD;  Location: [facility];  Service: Radiology;  Laterality: N/A;    SOCIAL HISTORY: Social History   Social History  . Marital Status: Married    Spouse Name: N/A  . Number of Children: N/A  . Years of Education: N/A   Occupational History  . Not on file.   Social History Main Topics  . Smoking status: Former Smoker -- 0.25 packs/day for 5 years    Types: Cigarettes  . Smokeless tobacco: Never Used     Comment: "15-20 years ago" 01/13/16  . Alcohol Use: Yes     Comment: occasionally  . Drug Use: No  . Sexual Activity: Not on file   Other Topics Concern  . Not on file   Social History Narrative   Married 58 years Son Previously smoked socially.  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  FAMILY HISTORY: Family History  Problem Relation Age  of Onset  . Hypertension Mother   . Hyperlipidemia Mother   . Thyroid disease Mother   . Coronary artery disease Mother   . Irregular heart beat Mother   . Fibroids Sister   . Hypertension Sister   . Ulcerative colitis Son   . Colon cancer Neg Hx    indicated that her mother is alive. She indicated that her father is alive. She indicated that her sister is alive. She indicated that her brother is alive. She indicated that her son is alive.   Mother died from heart disease in 30-Jan-2009, in her early 15s. Father is 44  years-old with dementia. He has prostate cancer in his lumbar spine, otherwise healthy. 2 sisters, 1 brother. Brother has mild schizophrenia Younger sister has high blood pressure and A fib Other sister also has high blood pressure  ALLERGIES:  is allergic to versed.  MEDICATIONS:  Current Outpatient Prescriptions  Medication Sig Dispense Refill  . acyclovir (ZOVIRAX) 800 MG tablet TAKE 1 TABLET BY MOUTH TWICE A DAY 10 tablet 0  . amLODipine (NORVASC) 5 MG tablet TAKE 1 TABLET BY MOUTH EVERY DAY 90 tablet 0  . aspirin 325 MG tablet Take 325 mg by mouth daily.    Marland Kitchen atorvastatin (LIPITOR) 20 MG tablet TAKE 1 TABLET BY MOUTH EVERY DAY 90 tablet 1  . azelastine (ASTELIN) 0.1 % nasal spray Place 2 sprays into both nostrils 2 (two) times daily. Use in each nostril as directed (Patient taking differently: Place 2 sprays into both nostrils 2 (two) times daily as needed (chronic sinusitis). Use in each nostril as directed) 30 mL 12  . clopidogrel (PLAVIX) 75 MG tablet Take 75 mg by mouth daily.    . fluticasone (FLONASE) 50 MCG/ACT nasal spray PLACE 2 SPRAYS INTO BOTH NOSTRILS DAILY. 48 g 0  . loratadine (CLARITIN) 10 MG tablet Take 10 mg by mouth daily.    Marland Kitchen loratadine (CLARITIN) 10 MG tablet TAKE ONE TABLET BY MOUTH ONCE DAILY 90 tablet 2  . naproxen sodium (ALEVE) 220 MG tablet Take 220 mg by mouth 2 (two) times daily as needed (menstrual cramps).    Marland Kitchen omeprazole (PRILOSEC) 20 MG capsule TAKE ONE CAPSULE BY MOUTH EVERY DAY AS NEEDED (Patient not taking: Reported on 02/03/2016) 90 capsule 1  . triamterene-hydrochlorothiazide (MAXZIDE) 75-50 MG tablet TAKE 1 TABLET BY MOUTH EVERY DAY 90 tablet 1   No current facility-administered medications for this visit.    Review of Systems  Constitutional: Negative for fever, chills, weight loss, malaise/fatigue and diaphoresis.  HENT: Negative.  Negative for congestion, hearing loss, nosebleeds, sore throat and tinnitus.   Eyes: Negative.  Negative for  blurred vision, double vision, pain and discharge.  Respiratory: Negative.  Negative for cough, hemoptysis, sputum production, shortness of breath and wheezing.   Cardiovascular: Negative for chest pain, palpitations, claudication, leg swelling and PND.  Gastrointestinal: Negative.  Negative for heartburn, nausea, vomiting, abdominal pain, diarrhea, constipation, blood in stool and melena.  Genitourinary: Negative.  Negative for dysuria, urgency, frequency and hematuria.  Musculoskeletal: Negative.  Negative for myalgias, joint pain and falls.  Skin: Negative.  Negative for itching and rash.  Neurological: Negative.  Negative for dizziness, tingling, tremors, sensory change, speech change, focal weakness, seizures, loss of consciousness, weakness and headaches.  Endo/Heme/Allergies: Negative.  Does not bruise/bleed easily.       Chronic sinus problems.  Psychiatric/Behavioral: Negative.  Negative for depression, suicidal ideas, memory loss and substance abuse. The patient is not nervous/anxious and  does not have insomnia.   All other systems reviewed and are negative.  14 point ROS was done and is otherwise as detailed above or in HPI   PHYSICAL EXAMINATION: ECOG PERFORMANCE STATUS: 0 - Asymptomatic  Filed Vitals:   03/21/16 1059  BP: 137/93  Pulse: 76  Temp: 98.7 F (37.1 C)  Resp: 18   Filed Weights   03/21/16 1059  Weight: 217 lb (98.431 kg)    Physical Exam  Constitutional: She is oriented to person, place, and time and well-developed, well-nourished, and in no distress.  HENT:  Head: Normocephalic and atraumatic.  Nose: Nose normal.  Mouth/Throat: Oropharynx is clear and moist. No oropharyngeal exudate.  Eyes: Conjunctivae and EOM are normal. Pupils are equal, round, and reactive to light. Right eye exhibits no discharge. Left eye exhibits no discharge. No scleral icterus.  Neck: Normal range of motion. Neck supple. No tracheal deviation present. No thyromegaly present.    Cardiovascular: Normal rate and normal heart sounds.  Exam reveals no gallop and no friction rub.   No murmur heard. Occasional Cardiac ectopy  Pulmonary/Chest: Effort normal and breath sounds normal. She has no wheezes. She has no rales.  Abdominal: Soft. Bowel sounds are normal. She exhibits no distension and no mass. There is no tenderness. There is no rebound and no guarding.  Musculoskeletal: Normal range of motion. She exhibits no edema.  Lymphadenopathy:    She has no cervical adenopathy.    She has axillary adenopathy.       Right: Inguinal adenopathy present.       Left: Inguinal and supraclavicular adenopathy present.  .  Neurological: She is alert and oriented to person, place, and time. She has normal reflexes. No cranial nerve deficit. Gait normal. Coordination normal.  Skin: Skin is warm and dry. No rash noted.  Psychiatric: Mood, memory, affect and judgment normal.  Nursing note and vitals reviewed.  LABORATORY DATA:  I have reviewed the data as listed Results for DECLYN, JARDON (MRN [id number]) as of 12/16/2015 16:36  Ref. Range 12/08/2015 15:30  Comment Unknown Comment  Total Protein ELP Latest Ref Range: 6.0-8.5 g/dL 7.3  Albumin ELP Latest Ref Range: 2.9-4.4 g/dL 4.0  Globulin, Total Latest Ref Range: 2.2-3.9 g/dL 3.3  A/G Ratio Latest Ref Range: 0.7-1.7  1.2  Alpha-1-Globulin Latest Ref Range: 0.0-0.4 g/dL 0.2  Alpha-2-Globulin Latest Ref Range: 0.4-1.0 g/dL 0.6  Beta Globulin Latest Ref Range: 0.7-1.3 g/dL 1.0  Gamma Globulin Latest Ref Range: 0.4-1.8 g/dL 1.5  M-SPIKE, % Latest Ref Range: Not Observed g/dL Not Observed  SPE Interp. Unknown Comment  WBC Latest Ref Range: 4.0-10.5 K/uL 4.9  RBC Latest Ref Range: 3.87-5.11 MIL/uL 4.26  Hemoglobin Latest Ref Range: 12.0-15.0 g/dL 12.3  HCT Latest Ref Range: 36.0-46.0 % 38.5  MCV Latest Ref Range: 78.0-100.0 fL 90.4  MCH Latest Ref Range: 26.0-34.0 pg 28.9  MCHC Latest Ref Range: 30.0-36.0 g/dL 31.9  RDW Latest  Ref Range: 11.5-15.5 % 13.5  Platelets Latest Ref Range: 150-400 K/uL 236  Neutrophils Latest Units: % 51  Lymphocytes Latest Units: % 30  Monocytes Relative Latest Units: % 10  Eosinophil Latest Units: % 8  Basophil Latest Units: % 1  NEUT# Latest Ref Range: 1.7-7.7 K/uL 2.5  Lymphocyte # Latest Ref Range: 0.7-4.0 K/uL 1.5  Monocyte # Latest Ref Range: 0.1-1.0 K/uL 0.5  Eosinophils Absolute Latest Ref Range: 0.0-0.7 K/uL 0.4  Basophils Absolute Latest Ref Range: 0.0-0.1 K/uL 0.0  Sed Rate Latest Ref Range: 0-22  mm/hr 28 (H)  Angiotensin-Converting Enzyme Latest Ref Range: 14-82 U/L 48  EBV VCA IgM Latest Ref Range: 0.0-35.9 U/mL <36.0  CMV IgM Latest Ref Range: 0.0-29.9 AU/mL <30.0  Hep B S Ab Unknown Non Reactive  HIV Latest Ref Range: Non Reactive  Non Reactive     RADIOGRAPHIC STUDIES: I have personally reviewed the radiological images as listed and agreed with the findings in the report. CLINICAL DATA: Followup of lymphadenopathy. No current complaints. History of "Kidney stent coils ". Hypertension. Obesity. Gastroesophageal reflux disease.  EXAM: CT CHEST WITH CONTRAST  TECHNIQUE: Multidetector CT imaging of the chest was performed during intravenous contrast administration.  CONTRAST: 62mL ISOVUE-300 IOPAMIDOL (ISOVUE-300) INJECTION 61%  COMPARISON: 12/15/2015 chest CT. Abdominal pelvic CT also 12/15/2015.  FINDINGS: Mediastinum/Nodes: Small supraclavicular and low jugular nodes are similar.  Increased number of bilateral subpectoral and axillary nodes again identified.  Index left axillary node measures 10 x 19 mm on image 54/series 2 versus 11 x 16 mm previously. Similar.  An index right subpectoral node measures 8 mm on image 43/series 2 and is unchanged.  Subcentimeter right thyroid hypo attenuating nodule is nonspecific. Tortuous thoracic aorta. Moderate cardiomegaly, without pericardial effusion. Pulmonary artery enlargement, including a  3.4 cm outflow tract. No central pulmonary embolism, on this non-dedicated study. No mediastinal or hilar adenopathy. No internal mammary adenopathy.  Lungs/Pleura: No pleural fluid. Clear lungs.  Upper abdomen: Normal imaged portions of the liver, spleen, stomach, pancreas, adrenal glands, right kidney. An upper pole left renal 12 mm lesion is consistent with a cyst.  Incompletely imaged right renal artery peripherally calcified saccular aneurysm is similar, 2.1 cm.  Musculoskeletal: Thoracolumbar spondylosis.  IMPRESSION: 1. Since 12/15/2015, similar number and size of bilateral axillary and subpectoral nodes. Although these could be reactive, an indolent lymphoproliferative process cannot be excluded. Consider imaging surveillance at 6-9 months to confirm ongoing stability. 2. 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 3. Pulmonary artery enlargement suggests pulmonary arterial hypertension. 4. Cardiomegaly   Electronically Signed  By: Abigail Miyamoto M.D.  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 I have recommended just proceeding with axillary excisional biopsy to rule out a lymphoproliferative process. I explained the difference between core biopsies and excisional biopsies of Lymph nodes. She is agreeable.   I will see her back about 5 days post biopsy to review pathology. Ongoing recommendations for follow-up will be made at that time.  All questions were answered. The patient knows to call the clinic with any problems, questions or  concerns.  This document serves as a record of services personally performed by Ancil Linsey, MD. It was created on her behalf by Toni Amend, a trained medical scribe. The creation of this record is based on the scribe's personal observations and the provider's statements to them. This document has been checked and approved by the attending provider.  I have reviewed the above documentation for accuracy and completeness, and I agree with the above.  This note was electronically signed.  Molli Hazard, MD  03/21/2016 11:14 AM

## 2016-03-22 ENCOUNTER — Encounter (HOSPITAL_COMMUNITY): Payer: Self-pay | Admitting: Hematology & Oncology

## 2016-03-22 ENCOUNTER — Other Ambulatory Visit: Payer: Self-pay | Admitting: Radiology

## 2016-03-22 ENCOUNTER — Encounter: Payer: Self-pay | Admitting: Radiology

## 2016-03-22 DIAGNOSIS — I722 Aneurysm of renal artery: Secondary | ICD-10-CM

## 2016-03-22 MED ORDER — CLOPIDOGREL BISULFATE 75 MG PO TABS: 75.0000 mg | ORAL_TABLET | Freq: Every day | ORAL | Status: DC

## 2016-04-05 NOTE — H&P (Signed)
  [facility] SOAP Note  Vital Signs:  Vitals as of: [id number]: Systolic [id number]: Diastolic 99: Heart Rate 79: Temp 98.58F (Temporal): Height 83ft 9in: Weight 215Lbs 0 Ounces: BMI 31.75   BMI : 31.75 kg/m2  Subjective: This 55 year old female presents for of lymphadenopathy.  Has been present since January of this year.  Found on routine mamography.  Axillary lymphadenopathy noted.  FNA of left axilla negative.  Was seen by Oncology.  Has had extensive workup thus far which is negative.  Patient has had some weight loss.  No tenderness, fever, chills, night sweats.  Referred by Oncology for lymph node biopsy.  Review of Symptoms:  Constitutional:negative Head:negative Eyes:blurred vision bilateral sinus problems Cardiovascular:negative Respiratory:negative Gastrointestinnegative Genitourinary:negative joint pain Skin:negative Hematolgic/Lymphatic:axillary lymph nodes, inguinal lymph nodes Allergic/Immunologic:negative   Past Medical History:Reviewed  Past Medical History  Surgical History: uterine ablation, umbilical herniorrhaphy, left inguinal herniorrhaphy Medical Problems: HTN, high cholesterol Allergies: nkda Medications: norvasc, lipitor, flonase, prilosec, maxzide, plavix, asa, zovirax   Social History:Reviewed  Social History  Preferred Language: English Race:  Black or African American Ethnicity: Not Hispanic / Latino Age: 2 year Marital Status:  M Alcohol: no   Smoking Status: Never smoker reviewed on 04/04/2016 Functional Status reviewed on 04/04/2016 ------------------------------------------------ Bathing: Normal Cooking: Normal Dressing: Normal Driving: Normal Eating: Normal Managing Meds: Normal Oral Care: Normal Shopping: Normal Toileting: Normal Transferring: Normal Walking: Normal Cognitive Status reviewed on 04/04/2016 ------------------------------------------------ Attention: Normal Decision Making: Normal Language:  Normal Memory: Normal Motor: Normal Perception: Normal Problem Solving: Normal Visual and Spatial: Normal   Family History:Reviewed  Family Health History Mother, Living; Heart block (conduction disorder);  Father, Deceased; Prostate cancer;     Objective Information: General:Well appearing, well nourished in no distress. Skin:no rash or prominent lesions Head:Atraumatic; no masses; no abnormalities Neck:Supple without lymphadenopathy.  Heart:RRR, no murmur or gallop.  Normal S1, S2.  No S3, S4.  Lungs:CTA bilaterally, no wheezes, rhonchi, rales.  Breathing unlabored. Abdomen:Soft, NT/ND, normal bowel sounds, no HSM, no masses.  No peritoneal signs.  Axillas with no overt lymphadenopathy.  Inguinal lymphadenopathy noted, right>>left.  Nontender. Various sizes. CT scans/oncology notes reviewed Assessment:Lymphadenopathy  Diagnoses: [id number]  R59.0 Inguinal lymphadenopathy (Localized enlarged lymph nodes)  Procedures: [id number] - OFFICE OUTPATIENT NEW 30 MINUTES    Plan:  Scheduled for inguinal lymph node biopsy on 04/14/16.  To hold plavix for one week.   Patient Education:Alternative treatments to surgery were discussed with patient (and family).Risks and benefits  of procedure including bleeding and infection were fully explained to the patient (and family) who gave informed consent. Patient/family questions were addressed.  Follow-up:Pending Surgery

## 2016-04-07 NOTE — Patient Instructions (Addendum)
Allison Hart  04/07/2016     @PREFPERIOPPHARMACY @   Your procedure is scheduled on 04/14/2016.  Report to [facility] at 6:15 A.M.  Call this number if you have problems the morning of surgery:  [phone]   Remember:  Do not eat food or drink liquids after midnight.  Take these medicines the morning of surgery with A SIP OF WATER : NORVASC, CLARITIN, PRILOSEC, FLONASE.    Do not wear jewelry, make-up or nail polish.  Do not wear lotions, powders, or perfumes.  You may wear deoderant.  Do not shave 48 hours prior to surgery.  Men may shave face and neck.  Do not bring valuables to the hospital.  [facility] is not responsible for any belongings or valuables.  Contacts, dentures or bridgework may not be worn into surgery.  Leave your suitcase in the car.  After surgery it may be brought to your room.  For patients admitted to the hospital, discharge time will be determined by your treatment team.  Patients discharged the day of surgery will not be allowed to drive home.   Name and phone number of your driver:   FAMILY Special instructions:  N/A  Please read over the following fact sheets that you were given. Care and Recovery After Surgery   General Anesthesia, Adult General anesthesia is a sleep-like state of non-feeling produced by medicines (anesthetics). General anesthesia prevents you from being alert and feeling pain during a medical procedure. Your caregiver may recommend general anesthesia if your procedure:  Is long.  Is painful or uncomfortable.  Would be frightening to see or hear.  Requires you to be still.  Affects your breathing.  Causes significant blood loss. LET YOUR CAREGIVER KNOW ABOUT:  Allergies to food or medicine.  Medicines taken, including vitamins, herbs, eyedrops, over-the-counter medicines, and creams.  Use of steroids (by mouth or creams).  Previous problems with anesthetics or numbing medicines, including problems experienced  by relatives.  History of bleeding problems or blood clots.  Previous surgeries and types of anesthetics received.  Possibility of pregnancy, if this applies.  Use of cigarettes, alcohol, or illegal drugs.  Any health condition(s), especially diabetes, sleep apnea, and high blood pressure. RISKS AND COMPLICATIONS General anesthesia rarely causes complications. However, if complications do occur, they can be life threatening. Complications include:  A lung infection.  A stroke.  A heart attack.  Waking up during the procedure. When this occurs, the patient may be unable to move and communicate that he or she is awake. The patient may feel severe pain. Older adults and adults with serious medical problems are more likely to have complications than adults who are young and healthy. Some complications can be prevented by answering all of your caregiver's questions thoroughly and by following all pre-procedure instructions. It is important to tell your caregiver if any of the pre-procedure instructions, especially those related to diet, were not followed. Any food or liquid in the stomach can cause problems when you are under general anesthesia. BEFORE THE PROCEDURE  Ask your caregiver if you will have to spend the night at the hospital. If you will not have to spend the night, arrange to have an adult drive you and stay with you for 24 hours.  Follow your caregiver's instructions if you are taking dietary supplements or medicines. Your caregiver may tell you to stop taking them or to reduce your dosage.  Do not smoke for as long as possible before your procedure. If  possible, stop smoking 3-6 weeks before the procedure.  Do not take new dietary supplements or medicines within 1 week of your procedure unless your caregiver approves them.  Do not eat within 8 hours of your procedure or as directed by your caregiver. Drink only clear liquids, such as water, black coffee (without milk or  cream), and fruit juices (without pulp).  Do not drink within 3 hours of your procedure or as directed by your caregiver.  You may brush your teeth on the morning of the procedure, but make sure to spit out the toothpaste and water when finished. PROCEDURE  You will receive anesthetics through a mask, through an intravenous (IV) access tube, or through both. A doctor who specializes in anesthesia (anesthesiologist) or a nurse who specializes in anesthesia (nurse anesthetist) or both will stay with you throughout the procedure to make sure you remain unconscious. He or she will also watch your blood pressure, pulse, and oxygen levels to make sure that the anesthetics do not cause any problems. Once you are asleep, a breathing tube or mask may be used to help you breathe. AFTER THE PROCEDURE You will wake up after the procedure is complete. You may be in the room where the procedure was performed or in a recovery area. You may have a sore throat if a breathing tube was used. You may also feel:  Dizzy.  Weak.  Drowsy.  Confused.  Nauseous.  Cold. These are all normal responses and can be expected to last for up to 24 hours after the procedure is complete. A caregiver will tell you when you are ready to go home. This will usually be when you are fully awake and in stable condition.   This information is not intended to replace advice given to you by your health care provider. Make sure you discuss any questions you have with your health care provider.   Document Released: 01/02/2008 Document Revised: 10/16/2014 Document Reviewed: 01/24/2012 Elsevier Interactive Patient Education [employer].

## 2016-04-10 ENCOUNTER — Other Ambulatory Visit: Payer: Self-pay

## 2016-04-10 ENCOUNTER — Encounter (HOSPITAL_COMMUNITY): Payer: Self-pay

## 2016-04-10 ENCOUNTER — Encounter (HOSPITAL_COMMUNITY)
Admission: RE | Admit: 2016-04-10 | Discharge: 2016-04-10 | Disposition: A | Discharge status: Home / Self Care | Payer: BLUE CROSS/BLUE SHIELD | Source: Ambulatory Visit | Attending: General Surgery | Admitting: General Surgery

## 2016-04-10 DIAGNOSIS — Z01812 Encounter for preprocedural laboratory examination: Secondary | ICD-10-CM | POA: Insufficient documentation

## 2016-04-10 LAB — BASIC METABOLIC PANEL
Anion gap: 8 (ref 5–15)
BUN: 11 mg/dL (ref 6–20)
CO2: 31 mmol/L (ref 22–32)
CREATININE: 0.67 mg/dL (ref 0.44–1.00)
Calcium: 8.7 mg/dL — ABNORMAL LOW (ref 8.9–10.3)
Chloride: 98 mmol/L — ABNORMAL LOW (ref 101–111)
GFR calc Af Amer: >60 mL/min (ref >60)
Glucose, Bld: 75 mg/dL (ref 65–99)
POTASSIUM: 3.2 mmol/L — AB (ref 3.5–5.1)
SODIUM: 137 mmol/L (ref 135–145)

## 2016-04-10 LAB — HCG, SERUM, QUALITATIVE: PREG SERUM: NEGATIVE

## 2016-04-10 LAB — CBC WITH DIFFERENTIAL/PLATELET
BASOS ABS: 0 10*3/uL (ref 0.0–0.1)
Basophils Relative: 1 %
EOS ABS: 0.2 10*3/uL (ref 0.0–0.7)
EOS PCT: 4 %
HCT: 36.6 % (ref 36.0–46.0)
Hemoglobin: 11.3 g/dL — ABNORMAL LOW (ref 12.0–15.0)
LYMPHS PCT: 31 %
Lymphs Abs: 1.5 10*3/uL (ref 0.7–4.0)
MCH: 28 pg (ref 26.0–34.0)
MCHC: 30.9 g/dL (ref 30.0–36.0)
MCV: 90.8 fL (ref 78.0–100.0)
Monocytes Absolute: 0.5 10*3/uL (ref 0.1–1.0)
Monocytes Relative: 9 %
Neutro Abs: 2.7 10*3/uL (ref 1.7–7.7)
Neutrophils Relative %: 55 %
PLATELETS: 244 10*3/uL (ref 150–400)
RBC: 4.03 MIL/uL (ref 3.87–5.11)
RDW: 13.8 % (ref 11.5–15.5)
WBC: 4.8 10*3/uL (ref 4.0–10.5)

## 2016-04-12 NOTE — Progress Notes (Addendum)
Pre procedure K + 3.2.  Not on supplement.  Dr Patsey Berthold made aware and advised to send message to Dr Arnoldo Morale, just to bring to his attention.  Patient was advised not to take Maxide day of procedure and marked off of medication list to take in am.  Lab routed to Dr Arnoldo Morale with note.

## 2016-04-14 ENCOUNTER — Ambulatory Visit (HOSPITAL_COMMUNITY): Payer: BLUE CROSS/BLUE SHIELD | Admitting: Anesthesiology

## 2016-04-14 ENCOUNTER — Encounter (HOSPITAL_COMMUNITY): Payer: Self-pay | Admitting: *Deleted

## 2016-04-14 ENCOUNTER — Encounter (HOSPITAL_COMMUNITY)
Admission: RE | Disposition: A | Discharge status: Home / Self Care | Payer: Self-pay | Source: Ambulatory Visit | Attending: General Surgery

## 2016-04-14 ENCOUNTER — Ambulatory Visit (HOSPITAL_COMMUNITY)
Admission: RE | Admit: 2016-04-14 | Discharge: 2016-04-14 | Disposition: A | Discharge status: Home / Self Care | Payer: BLUE CROSS/BLUE SHIELD | Source: Ambulatory Visit | Attending: General Surgery | Admitting: General Surgery

## 2016-04-14 ENCOUNTER — Ambulatory Visit (HOSPITAL_COMMUNITY): Payer: BLUE CROSS/BLUE SHIELD | Admitting: Hematology & Oncology

## 2016-04-14 DIAGNOSIS — I10 Essential (primary) hypertension: Secondary | ICD-10-CM | POA: Diagnosis not present

## 2016-04-14 DIAGNOSIS — K219 Gastro-esophageal reflux disease without esophagitis: Secondary | ICD-10-CM | POA: Insufficient documentation

## 2016-04-14 DIAGNOSIS — E7800 Pure hypercholesterolemia, unspecified: Secondary | ICD-10-CM | POA: Diagnosis not present

## 2016-04-14 DIAGNOSIS — Z7982 Long term (current) use of aspirin: Secondary | ICD-10-CM | POA: Diagnosis not present

## 2016-04-14 DIAGNOSIS — Z79899 Other long term (current) drug therapy: Secondary | ICD-10-CM | POA: Diagnosis not present

## 2016-04-14 DIAGNOSIS — Z7902 Long term (current) use of antithrombotics/antiplatelets: Secondary | ICD-10-CM | POA: Diagnosis not present

## 2016-04-14 DIAGNOSIS — I739 Peripheral vascular disease, unspecified: Secondary | ICD-10-CM | POA: Insufficient documentation

## 2016-04-14 DIAGNOSIS — I491 Atrial premature depolarization: Secondary | ICD-10-CM | POA: Insufficient documentation

## 2016-04-14 DIAGNOSIS — R590 Localized enlarged lymph nodes: Secondary | ICD-10-CM | POA: Insufficient documentation

## 2016-04-14 DIAGNOSIS — N289 Disorder of kidney and ureter, unspecified: Secondary | ICD-10-CM | POA: Diagnosis not present

## 2016-04-14 DIAGNOSIS — Z87891 Personal history of nicotine dependence: Secondary | ICD-10-CM | POA: Insufficient documentation

## 2016-04-14 HISTORY — PX: INGUINAL LYMPH NODE BIOPSY: SHX5865

## 2016-04-14 SURGERY — BIOPSY, LYMPH NODE, INGUINAL, OPEN
Anesthesia: General | Laterality: Right

## 2016-04-14 MED ORDER — FENTANYL CITRATE (PF) 100 MCG/2ML IJ SOLN: 25.0000 ug | INTRAMUSCULAR | Status: DC | PRN

## 2016-04-14 MED ORDER — GLYCOPYRROLATE 0.2 MG/ML IJ SOLN
0.2000 mg | Freq: Once | INTRAMUSCULAR | Status: AC | PRN
  Administered 2016-04-14: 0.2 mg via INTRAVENOUS

## 2016-04-14 MED ORDER — LACTATED RINGERS IV SOLN
INTRAVENOUS | Status: DC
  Administered 2016-04-14: 07:00:00 via INTRAVENOUS

## 2016-04-14 MED ORDER — PROPOFOL 10 MG/ML IV BOLUS
INTRAVENOUS | Status: AC
  Filled 2016-04-14: qty 20

## 2016-04-14 MED ORDER — MIDAZOLAM HCL 2 MG/2ML IJ SOLN
INTRAMUSCULAR | Status: AC
  Filled 2016-04-14: qty 2

## 2016-04-14 MED ORDER — 0.9 % SODIUM CHLORIDE (POUR BTL) OPTIME
TOPICAL | Status: DC | PRN
  Administered 2016-04-14: 1000 mL

## 2016-04-14 MED ORDER — FENTANYL CITRATE (PF) 250 MCG/5ML IJ SOLN
INTRAMUSCULAR | Status: DC | PRN
  Administered 2016-04-14: 25 ug via INTRAVENOUS

## 2016-04-14 MED ORDER — ONDANSETRON HCL 4 MG/2ML IJ SOLN: 4.0000 mg | Freq: Once | INTRAMUSCULAR | Status: DC | PRN

## 2016-04-14 MED ORDER — GLYCOPYRROLATE 0.2 MG/ML IJ SOLN
INTRAMUSCULAR | Status: AC
Start: 2016-04-14 — End: 2016-04-14
  Filled 2016-04-14: qty 1

## 2016-04-14 MED ORDER — CHLORHEXIDINE GLUCONATE CLOTH 2 % EX PADS: 6.0000 | MEDICATED_PAD | Freq: Once | CUTANEOUS | Status: DC

## 2016-04-14 MED ORDER — DIPHENHYDRAMINE HCL 50 MG/ML IJ SOLN
25.0000 mg | Freq: Once | INTRAMUSCULAR | Status: AC
  Administered 2016-04-14: 25 mg via INTRAVENOUS

## 2016-04-14 MED ORDER — CEFAZOLIN SODIUM-DEXTROSE 2-4 GM/100ML-% IV SOLN
2.0000 g | INTRAVENOUS | Status: AC
  Administered 2016-04-14: 2 g via INTRAVENOUS
  Filled 2016-04-14: qty 100

## 2016-04-14 MED ORDER — ONDANSETRON HCL 4 MG/2ML IJ SOLN
4.0000 mg | Freq: Once | INTRAMUSCULAR | Status: AC
  Administered 2016-04-14: 4 mg via INTRAVENOUS

## 2016-04-14 MED ORDER — DIPHENHYDRAMINE HCL 50 MG/ML IJ SOLN
INTRAMUSCULAR | Status: AC
  Filled 2016-04-14: qty 1

## 2016-04-14 MED ORDER — PROPOFOL 10 MG/ML IV BOLUS
INTRAVENOUS | Status: DC | PRN
  Administered 2016-04-14: 150 mg via INTRAVENOUS

## 2016-04-14 MED ORDER — KETOROLAC TROMETHAMINE 30 MG/ML IJ SOLN
30.0000 mg | Freq: Once | INTRAMUSCULAR | Status: AC
  Administered 2016-04-14: 30 mg via INTRAVENOUS
  Filled 2016-04-14: qty 1

## 2016-04-14 MED ORDER — BUPIVACAINE HCL (PF) 0.5 % IJ SOLN
INTRAMUSCULAR | Status: DC | PRN
  Administered 2016-04-14: 9 mL

## 2016-04-14 MED ORDER — HYDROCODONE-ACETAMINOPHEN 5-325 MG PO TABS: 1.0000 | ORAL_TABLET | Freq: Four times a day (QID) | ORAL | Status: DC | PRN

## 2016-04-14 MED ORDER — LIDOCAINE HCL 1 % IJ SOLN
INTRAMUSCULAR | Status: DC | PRN
  Administered 2016-04-14: 50 mg via INTRADERMAL

## 2016-04-14 MED ORDER — FENTANYL CITRATE (PF) 100 MCG/2ML IJ SOLN
25.0000 ug | INTRAMUSCULAR | Status: DC | PRN
  Administered 2016-04-14: 25 ug via INTRAVENOUS
  Filled 2016-04-14: qty 2

## 2016-04-14 MED ORDER — BUPIVACAINE HCL (PF) 0.5 % IJ SOLN
INTRAMUSCULAR | Status: AC
  Filled 2016-04-14: qty 30

## 2016-04-14 MED ORDER — ONDANSETRON HCL 4 MG/2ML IJ SOLN
INTRAMUSCULAR | Status: AC
  Filled 2016-04-14: qty 2

## 2016-04-14 MED ORDER — LIDOCAINE HCL (PF) 1 % IJ SOLN
INTRAMUSCULAR | Status: AC
  Filled 2016-04-14: qty 5

## 2016-04-14 MED ORDER — FENTANYL CITRATE (PF) 250 MCG/5ML IJ SOLN
INTRAMUSCULAR | Status: AC
  Filled 2016-04-14: qty 5

## 2016-04-14 SURGICAL SUPPLY — 36 items
ADH SKN CLS APL DERMABOND .7 (GAUZE/BANDAGES/DRESSINGS) ×1
APPLIER CLIP 9.375 SM OPEN (CLIP)
APR CLP SM 9.3 20 MLT OPN (CLIP)
BAG HAMPER (MISCELLANEOUS) ×3 IMPLANT
CLIP APPLIE 9.375 SM OPEN (CLIP) IMPLANT
CLOTH BEACON ORANGE TIMEOUT ST (SAFETY) ×3 IMPLANT
COVER LIGHT HANDLE STERIS (MISCELLANEOUS) ×6 IMPLANT
DECANTER SPIKE VIAL GLASS SM (MISCELLANEOUS) ×3 IMPLANT
DERMABOND ADVANCED (GAUZE/BANDAGES/DRESSINGS) ×2
DERMABOND ADVANCED .7 DNX12 (GAUZE/BANDAGES/DRESSINGS) ×1 IMPLANT
ELECT REM PT RETURN 9FT ADLT (ELECTROSURGICAL) ×3
ELECTRODE REM PT RTRN 9FT ADLT (ELECTROSURGICAL) ×1 IMPLANT
FORMALIN 10 PREFIL 120ML (MISCELLANEOUS) ×3 IMPLANT
GLOVE BIOGEL M 7.0 STRL (GLOVE) ×3 IMPLANT
GLOVE BIOGEL PI IND STRL 7.0 (GLOVE) ×3 IMPLANT
GLOVE BIOGEL PI INDICATOR 7.0 (GLOVE) ×6
GLOVE ECLIPSE 7.0 STRL STRAW (GLOVE) ×3 IMPLANT
GLOVE SURG SS PI 7.5 STRL IVOR (GLOVE) ×3 IMPLANT
GOWN STRL REUS W/TWL LRG LVL3 (GOWN DISPOSABLE) ×9 IMPLANT
KIT CLEAN CATCH URINE (SET/KITS/TRAYS/PACK) ×3 IMPLANT
KIT ROOM TURNOVER APOR (KITS) ×3 IMPLANT
LIQUID BAND (GAUZE/BANDAGES/DRESSINGS) IMPLANT
MANIFOLD NEPTUNE II (INSTRUMENTS) ×3 IMPLANT
NEEDLE HYPO 25X1 1.5 SAFETY (NEEDLE) ×3 IMPLANT
NS IRRIG 1000ML POUR BTL (IV SOLUTION) ×3 IMPLANT
PACK MINOR (CUSTOM PROCEDURE TRAY) ×3 IMPLANT
PAD ARMBOARD 7.5X6 YLW CONV (MISCELLANEOUS) ×3 IMPLANT
SCRUB PCMX 4 OZ (MISCELLANEOUS) ×3 IMPLANT
SET BASIN LINEN APH (SET/KITS/TRAYS/PACK) ×3 IMPLANT
SHEARS HARMONIC 9CM CVD (BLADE) ×3 IMPLANT
SPONGE INTESTINAL PEANUT (DISPOSABLE) ×3 IMPLANT
SUT VIC AB 3-0 SH 27 (SUTURE) ×2
SUT VIC AB 3-0 SH 27X BRD (SUTURE) ×1 IMPLANT
SUT VIC AB 4-0 PS2 27 (SUTURE) ×3 IMPLANT
SUT VICRYL AB 3 0 TIES (SUTURE) ×3 IMPLANT
SYR CONTROL 10ML LL (SYRINGE) ×3 IMPLANT

## 2016-04-14 NOTE — Op Note (Signed)
Patient:  Allison Hart  DOB:  07-21-1961  MRN:  [id number]   Preop Diagnosis:  Lymphadenopathy  Postop Diagnosis:  Same  Procedure:  Right inguinal lymph node biopsy  Surgeon:  Aviva Signs, M.D.  Anes:  Gen.  Indications:  Patient is a 55 year old black female who had a nondiagnostic fine-needle aspiration of a lymph node in the past who now presents for a right inguinal lymph node biopsy to be sent for flow cytometry. The risks and benefits of the procedure including bleeding, infection, and swelling were fully explained to the patient, who gave informed consent.  Procedure note:  The patient was placed the supine position. After general anesthesia was administered, the right groin region was prepped and draped using the usual sterile technique with CHG. Surgical site confirmation was performed.  An incision was made in the right groin region over the lymph nodes. The dissection was taken down to 2 easily palpable lymph nodes. Both were excised without difficulty. Both were sent for flow cytometry. A bleeding was controlled using 3-0 Vicryl ties. The subcutaneous layer was reapproximated using a 3-0 Vicryl interrupted suture. The skin was closed using a 4-0 Vicryl subcuticular suture. 0.5% Sensorcaine was instilled into the surrounding wound. Dermabond was then applied.  All tape and needle counts were correct at the end of the procedure. Patient was awakened and transferred to PACU in stable condition.    Complications:  None  EBL:  Minimal  Specimen:  Right inguinal lymph nodes

## 2016-04-14 NOTE — Transfer of Care (Signed)
Immediate Anesthesia Transfer of Care Note  Patient: Allison Hart  Procedure(s) Performed: Procedure(s): RIGHT INGUINAL LYMPH NODE BIOPSY (Right)  Patient Location: PACU  Anesthesia Type:General  Level of Consciousness: awake  Airway & Oxygen Therapy: Patient Spontanous Breathing and Patient connected to nasal cannula oxygen  Post-op Assessment: Report given to RN  Post vital signs: Reviewed and stable  Last Vitals:  Filed Vitals:   04/14/16 0730 04/14/16 0735  BP: 142/92   Pulse:    Temp:    Resp: 22 25    Last Pain:  Filed Vitals:   04/14/16 0735  PainSc: 2       Patients Stated Pain Goal: 6 ([id number])  Complications: No apparent anesthesia complications

## 2016-04-14 NOTE — Anesthesia Preprocedure Evaluation (Signed)
Anesthesia Evaluation  Patient identified by MRN, date of birth, ID band Patient awake    Reviewed: Allergy & Precautions, NPO status , Patient's Chart, lab work & pertinent test results  Airway Mallampati: II  TM Distance: >3 FB Neck ROM: full    Dental  (+) Teeth Intact, Dental Advidsory Given   Pulmonary former smoker,    breath sounds clear to auscultation       Cardiovascular hypertension, Pt. 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 )negative Renal ROS     Musculoskeletal   Abdominal Normal abdominal exam  (+)   Peds  Hematology   Anesthesia Other Findings   Reproductive/Obstetrics                             Anesthesia Physical Anesthesia Plan  ASA: II  Anesthesia Plan: General   Post-op Pain Management:    Induction: Intravenous  Airway Management Planned: LMA  Additional Equipment:   Intra-op Plan:   Post-operative Plan: Extubation in OR  Informed Consent: I have reviewed the patients History and Physical, chart, labs and discussed the procedure including the risks, benefits and alternatives for the proposed anesthesia with the patient or authorized representative who has indicated his/her understanding and acceptance.     Plan Discussed with:   Anesthesia Plan Comments: (Off plavix 7 days.)        Anesthesia Quick Evaluation

## 2016-04-14 NOTE — Anesthesia Procedure Notes (Signed)
Procedure Name: LMA Insertion Date/Time: 04/14/2016 7:47 AM Performed by: Tressie Stalker E Pre-anesthesia Checklist: Patient identified, Patient being monitored, Emergency Drugs available, Timeout performed and Suction available Patient Re-evaluated:Patient Re-evaluated prior to inductionOxygen Delivery Method: Circle System Utilized Preoxygenation: Pre-oxygenation with 100% oxygen Intubation Type: IV induction Ventilation: Mask ventilation without difficulty LMA: LMA inserted LMA Size: 4.0 Number of attempts: 1 Placement Confirmation: positive ETCO2 and breath sounds checked- equal and bilateral

## 2016-04-14 NOTE — Discharge Instructions (Signed)

## 2016-04-14 NOTE — Anesthesia Postprocedure Evaluation (Signed)
Anesthesia Post Note  Patient: Allison Hart  Procedure(s) Performed: Procedure(s) (LRB): RIGHT INGUINAL LYMPH NODE BIOPSY (Right)  Patient location during evaluation: PACU Anesthesia Type: General Level of consciousness: awake Pain management: satisfactory to patient Vital Signs Assessment: post-procedure vital signs reviewed and stable Respiratory status: spontaneous breathing and patient connected to nasal cannula oxygen Cardiovascular status: stable Anesthetic complications: no    Last Vitals:  Filed Vitals:   04/14/16 0825 04/14/16 0830  BP: 138/95   Pulse: 74 74  Temp: 36.6 C   Resp: 14 19    Last Pain:  Filed Vitals:   04/14/16 0833  PainSc: Asleep                 Drucie Opitz

## 2016-04-14 NOTE — Interval H&P Note (Signed)
History and Physical Interval Note:  04/14/2016 7:21 AM  Allison Hart  has presented today for surgery, with the diagnosis of lymphadenopathy  The various methods of treatment have been discussed with the patient and family. After consideration of risks, benefits and other options for treatment, the patient has consented to  Procedure(s): INGUINAL LYMPH NODE BIOPSY (Right) as a surgical intervention .  The patient's history has been reviewed, patient examined, no change in status, stable for surgery.  I have reviewed the patient's chart and labs.  Questions were answered to the patient's satisfaction.     Aviva Signs A

## 2016-04-18 ENCOUNTER — Encounter: Payer: BLUE CROSS/BLUE SHIELD | Admitting: Family Medicine

## 2016-04-20 ENCOUNTER — Encounter (HOSPITAL_COMMUNITY): Payer: Self-pay | Admitting: General Surgery

## 2016-04-25 ENCOUNTER — Encounter (HOSPITAL_COMMUNITY): Payer: Self-pay | Admitting: Hematology & Oncology

## 2016-04-25 ENCOUNTER — Ambulatory Visit (HOSPITAL_COMMUNITY)
Admission: RE | Admit: 2016-04-25 | Discharge: 2016-04-25 | Disposition: A | Discharge status: Home / Self Care | Payer: BLUE CROSS/BLUE SHIELD | Source: Ambulatory Visit | Attending: Hematology & Oncology | Admitting: Hematology & Oncology

## 2016-04-25 ENCOUNTER — Encounter (HOSPITAL_COMMUNITY): Payer: BLUE CROSS/BLUE SHIELD | Attending: Hematology & Oncology | Admitting: Hematology & Oncology

## 2016-04-25 VITALS — BP 150/81 | HR 79 | Temp 98.8°F | Resp 16 | Wt 218.0 lb

## 2016-04-25 DIAGNOSIS — R1900 Intra-abdominal and pelvic swelling, mass and lump, unspecified site: Secondary | ICD-10-CM | POA: Diagnosis not present

## 2016-04-25 DIAGNOSIS — R222 Localized swelling, mass and lump, trunk: Secondary | ICD-10-CM | POA: Diagnosis not present

## 2016-04-25 DIAGNOSIS — R1909 Other intra-abdominal and pelvic swelling, mass and lump: Secondary | ICD-10-CM

## 2016-04-25 DIAGNOSIS — R599 Enlarged lymph nodes, unspecified: Secondary | ICD-10-CM

## 2016-04-25 DIAGNOSIS — R591 Generalized enlarged lymph nodes: Secondary | ICD-10-CM | POA: Diagnosis not present

## 2016-04-25 LAB — BUN: BUN: 11 mg/dL (ref 7–25)

## 2016-04-25 LAB — CREATININE WITH EST GFR
Creat: 0.91 mg/dL (ref 0.50–1.05)
GFR, EST AFRICAN AMERICAN: 83 mL/min (ref >=60)
GFR, Est Non African American: 72 mL/min (ref >=60)

## 2016-04-25 MED ORDER — CEPHALEXIN 500 MG PO CAPS: 500.0000 mg | ORAL_CAPSULE | Freq: Four times a day (QID) | ORAL | Status: DC

## 2016-04-25 MED ORDER — FLUCONAZOLE 150 MG PO TABS: ORAL_TABLET | ORAL | Status: DC

## 2016-04-25 NOTE — Patient Instructions (Signed)
[place] at [facility] Discharge Instructions  RECOMMENDATIONS MADE BY THE CONSULTANT AND ANY TEST RESULTS WILL BE SENT TO YOUR REFERRING PHYSICIAN.  Exam done and seen today by Dr. Whitney Muse Ultrasound done today You can come to see her anytime, continue to see Dr.Simpson Please call the clinic if you have any questions or concerns  Thank you for choosing [place] at [facility] to provide your oncology and hematology care.  To afford each patient quality time with our provider, please arrive at least 15 minutes before your scheduled appointment time.   Beginning January 23rd 2017 lab work for the [facility] will be done in the  Main lab at [facility] on 1st floor. If you have a lab appointment with the [facility] please come in thru the  Main Entrance and check in at the main information desk  You need to re-schedule your appointment should you arrive 10 or more minutes late.  We strive to give you quality time with our providers, and arriving late affects you and other patients whose appointments are after yours.  Also, if you no show three or more times for appointments you may be dismissed from the clinic at the providers discretion.     Again, thank you for choosing [facility].  Our hope is that these requests will decrease the amount of time that you wait before being seen by our physicians.       _____________________________________________________________  Should you have questions after your visit to [facility], please contact our office at [phone] between the hours of 8:30 a.m. and 4:30 p.m.  Voicemails left after 4:30 p.m. will not be returned until the following business day.  For prescription refill requests, have your pharmacy contact our office.         Resources For Cancer Patients and their Caregivers ? American Cancer Society: Can assist with transportation, wigs, general needs,  runs Look Good Feel Better.        619-089-6068 ? Cancer Care: Provides financial assistance, online support groups, medication/co-pay assistance.  1-800-813-HOPE (601)040-1330) ? [place] Assists [place] cancer patients and their families through emotional , educational and financial support.  306-126-7767 ? [place] DSS Where to apply for food stamps, Medicaid and utility assistance. (204) 554-0014 ? [facility]: Transportation to medical appointments. (940) 015-7548 ? Social Security Administration: May apply for disability if have a Stage IV cancer. 223 115 0569 208-622-3634 ? [employer], Disability and Transit Services: Assists with nutrition, care and transit needs. [place] Support Programs: @10RELATIVEDAYS @ > Cancer Support Group  2nd Tuesday of the month 1pm-2pm, Journey Room  > Creative Journey  3rd Tuesday of the month 1130am-1pm, Journey Room  > Look Good Feel Better  1st Wednesday of the month 10am-12 noon, Journey Room (Call [place] to register [phone])

## 2016-04-25 NOTE — Progress Notes (Signed)
[place] at [facility] Note  Patient Care Team: Fayrene Helper, MD as PCP - General Izora Gala, MD as Consulting Physician (Otolaryngology)  CHIEF COMPLAINTS/PURPOSE OF CONSULTATION:  Lymphadenopathy Screening mammogram on 11/09/2015 with lymphadenopathy in the L axillae Bilateral breast U/S showing bilateral axillary adenopathy Pathology from Left LN biopsy (needle/core biopsy) on 11/23/2015 with normal flow cytometry, lymphoid hyperplasia CT chest on 03/15/2016 with similar number and size of bilateral axillary and subpectoral nodes, indolent lymphoproliferative process cannot be excluded. Consider imaging surveillance at 6-9 months to confirm ongoing stability. 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 female is here because of referral by Dr. Moshe Cipro for Lymphadenopathy found on mammography. Needle core biopsy showed normal flow cytometry and lymphoid hyperplasia. Remainder of evaluation was unremarkable. Adenopathy persists.   At her last visit we had discussed ongoing follow-up versus open biopsy, she ultimately chose open biopsy. She presents today to review pathology and for further follow-up recommendations. Her sister is with her today.   She notes significant groin swelling and pain. She saw Dr. Arnoldo Morale and notes that he tried to remove fluid but was unable. The swelling has gotten worse. No fever or chills.   MEDICAL HISTORY:  Past Medical History  Diagnosis Date  . GERD (gastroesophageal reflux disease)   . Essential hypertension, benign   . Obesity   . Murmur   . Hypercholesteremia   . Herpes   . Sinusitis   . Urinary frequency     SURGICAL HISTORY: Past Surgical History  Procedure Laterality Date  . Umbilical hernia repair  infancy   . Left inguinal hernia repair and endometriosis  2007    No definitive records   . Left  parotid bx benign  2005  . Embolization of uterine artery x 2    . Colonoscopy  11/13/2012    Procedure: COLONOSCOPY;  Surgeon: Rogene Houston, MD;  [facility]: [facility];  Service: Endoscopy;  Laterality: N/A;  930  . Radiology with anesthesia N/A 01/18/2016    Procedure: RADIOLOGY WITH ANESTHESIA;  Surgeon: Luanne Bras, MD;  Location: [facility];  Service: Radiology;  Laterality: N/A;  . Renal artery aneurysm repair right Right 01/2016  . Inguinal lymph node biopsy Right 04/14/2016    Procedure: RIGHT INGUINAL LYMPH NODE BIOPSY;  Surgeon: Aviva Signs, MD;  Location: [facility] OR[facility];  Service: General;  Laterality: Right;    SOCIAL HISTORY: Social History   Social History  . Marital Status: Married    Spouse Name: N/A  . Number of Children: N/A  . Years of Education: N/A   Occupational History  . Not on file.   Social History Main Topics  . Smoking status: Former Smoker -- 0.25 packs/day for 5 years    Types: Cigarettes  . Smokeless tobacco: Never Used     Comment: "15-20 years ago" 01/13/16  . Alcohol Use: Yes     Comment: occasionally  . Drug Use: No  . Sexual Activity: Not on file   Other Topics Concern  . Not on file   Social History Narrative   Married 58 years Son Previously smoked socially.  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  FAMILY HISTORY: Family History  Problem Relation Age of Onset  . Hypertension Mother   . Hyperlipidemia Mother   . Thyroid disease Mother   . Coronary  artery disease Mother   . Irregular heart beat Mother   . Fibroids Sister   . Hypertension Sister   . Ulcerative colitis Son   . Colon cancer Neg Hx    indicated that her mother is alive. She indicated that her father is alive. She indicated that her sister is alive. She indicated that her brother is alive. She indicated that her son is alive.   Mother died from heart disease in 12-Dec-2008, in her early 57s. Father is 60 years-old with dementia.  He has prostate cancer in his lumbar spine, otherwise healthy. 2 sisters, 1 brother. Brother has mild schizophrenia Younger sister has high blood pressure and A fib Other sister also has high blood pressure  ALLERGIES:  is allergic to versed.  MEDICATIONS:  Current Outpatient Prescriptions  Medication Sig Dispense Refill  . acyclovir (ZOVIRAX) 800 MG tablet TAKE 1 TABLET BY MOUTH TWICE A DAY 10 tablet 0  . amLODipine (NORVASC) 5 MG tablet TAKE 1 TABLET BY MOUTH EVERY DAY 90 tablet 0  . aspirin 325 MG tablet Take 325 mg by mouth daily.    Marland Kitchen atorvastatin (LIPITOR) 20 MG tablet TAKE 1 TABLET BY MOUTH EVERY DAY 90 tablet 1  . azelastine (ASTELIN) 0.1 % nasal spray Place 2 sprays into both nostrils 2 (two) times daily. Use in each nostril as directed (Patient taking differently: Place 2 sprays into both nostrils 2 (two) times daily as needed (chronic sinusitis). Use in each nostril as directed) 30 mL 12  . clopidogrel (PLAVIX) 75 MG tablet Take 1 tablet (75 mg total) by mouth daily. 60 tablet 0  . fluticasone (FLONASE) 50 MCG/ACT nasal spray PLACE 2 SPRAYS INTO BOTH NOSTRILS DAILY. 48 g 0  . HYDROcodone-acetaminophen (NORCO/VICODIN) 5-325 MG tablet Take 1-2 tablets by mouth every 6 (six) hours as needed for moderate pain. 40 tablet 0  . loratadine (CLARITIN) 10 MG tablet TAKE ONE TABLET BY MOUTH ONCE DAILY 90 tablet 2  . triamterene-hydrochlorothiazide (MAXZIDE) 75-50 MG tablet TAKE 1 TABLET BY MOUTH EVERY DAY 90 tablet 1  . cephALEXin (KEFLEX) 500 MG capsule Take 1 capsule (500 mg total) by mouth 4 (four) times daily. 28 capsule 0  . fluconazole (DIFLUCAN) 150 MG tablet Take one tablet by mouth at onset of yeast infection then repeat in 3 days 2 tablet 0   No current facility-administered medications for this visit.    Review of Systems  Constitutional: Negative for fever, chills, weight loss, malaise/fatigue and diaphoresis.  HENT: Negative.  Negative for congestion, hearing loss,  nosebleeds, sore throat and tinnitus.   Eyes: Negative.  Negative for blurred vision, double vision, pain and discharge.  Respiratory: Negative.  Negative for cough, hemoptysis, sputum production, shortness of breath and wheezing.   Cardiovascular: Negative for chest pain, palpitations, claudication, leg swelling and PND.  Gastrointestinal: Negative.  Negative for heartburn, nausea, vomiting, abdominal pain, diarrhea, constipation, blood in stool and melena.  Genitourinary: Negative.  Negative for dysuria, urgency, frequency and hematuria.  Musculoskeletal: Negative.  Negative for myalgias, joint pain and falls.  Skin: Negative.  Negative for itching and rash.  Neurological: Negative.  Negative for dizziness, tingling, tremors, sensory change, speech change, focal weakness, seizures, loss of consciousness, weakness and headaches.  Endo/Heme/Allergies: Negative.  Does not bruise/bleed easily.       Chronic sinus problems.  Psychiatric/Behavioral: Negative.  Negative for depression, suicidal ideas, memory loss and substance abuse. The patient is not nervous/anxious and does not have insomnia.   All other systems  reviewed and are negative. 14 point ROS was done and is otherwise as detailed above or in HPI   PHYSICAL EXAMINATION: ECOG PERFORMANCE STATUS: 0 - Asymptomatic  Filed Vitals:   04/25/16 1002  BP: 150/81  Pulse: 79  Temp: 98.8 F (37.1 C)  Resp: 16   Filed Weights   04/25/16 1002  Weight: 218 lb (98.884 kg)    Physical Exam  Constitutional: She is oriented to person, place, and time and well-developed, well-nourished, and in no distress.  HENT:  Head: Normocephalic and atraumatic.  Nose: Nose normal.  Mouth/Throat: Oropharynx is clear and moist. No oropharyngeal exudate.  Eyes: Conjunctivae and EOM are normal. Pupils are equal, round, and reactive to light. Right eye exhibits no discharge. Left eye exhibits no discharge. No scleral icterus.  Neck: Normal range of motion.  Neck supple. No tracheal deviation present. No thyromegaly present.  Cardiovascular: Normal rate and normal heart sounds.  Exam reveals no gallop and no friction rub.   No murmur heard. Occasional Cardiac ectopy  Pulmonary/Chest: Effort normal and breath sounds normal. She has no wheezes. She has no rales.  Abdominal: Soft. Bowel sounds are normal. She exhibits no distension and no mass. There is no tenderness. There is no rebound and no guarding.  Genitourinary:  R groin with significant swelling that extends into perineum, firm, erythema noted. Swelling extends into r labia  Musculoskeletal: Normal range of motion. She exhibits no edema.  Lymphadenopathy:    She has no cervical adenopathy.    She has axillary adenopathy.       Right: Inguinal adenopathy present.       Left: Inguinal and supraclavicular adenopathy present.  .  Neurological: She is alert and oriented to person, place, and time. She has normal reflexes. No cranial nerve deficit. Gait normal. Coordination normal.  Skin: Skin is warm and dry. No rash noted.  Psychiatric: Mood, memory, affect and judgment normal.  Nursing note and vitals reviewed.  LABORATORY DATA:  I have reviewed the data as listed Results for Allison, Hart (MRN [id number]) as of 04/28/2016 09:42  Ref. Range 04/10/2016 13:00  Sodium Latest Ref Range: 135-145 mmol/L 137  Potassium Latest Ref Range: 3.5-5.1 mmol/L 3.2 (L)  Chloride Latest Ref Range: 101-111 mmol/L 98 (L)  CO2 Latest Ref Range: 22-32 mmol/L 31  BUN Latest Ref Range: 6-20 mg/dL 11  Creatinine Latest Ref Range: 0.44-1.00 mg/dL 0.67  Calcium Latest Ref Range: 8.9-10.3 mg/dL 8.7 (L)  EGFR (Non-African Amer.) Latest Ref Range: >60 mL/min >60  EGFR (African American) Latest Ref Range: >60 mL/min >60  Glucose Latest Ref Range: 65-99 mg/dL 75  Anion gap Latest Ref Range: 5-15  8  WBC Latest Ref Range: 4.0-10.5 K/uL 4.8  RBC Latest Ref Range: 3.87-5.11 MIL/uL 4.03  Hemoglobin Latest Ref Range:  12.0-15.0 g/dL 11.3 (L)  HCT Latest Ref Range: 36.0-46.0 % 36.6  MCV Latest Ref Range: 78.0-100.0 fL 90.8  MCH Latest Ref Range: 26.0-34.0 pg 28.0  MCHC Latest Ref Range: 30.0-36.0 g/dL 30.9  RDW Latest Ref Range: 11.5-15.5 % 13.8  Platelets Latest Ref Range: 150-400 K/uL 244  Neutrophils Latest Units: % 55  Lymphocytes Latest Units: % 31  Monocytes Relative Latest Units: % 9  Eosinophil Latest Units: % 4  Basophil Latest Units: % 1  NEUT# Latest Ref Range: 1.7-7.7 K/uL 2.7  Lymphocyte # Latest Ref Range: 0.7-4.0 K/uL 1.5  Monocyte # Latest Ref Range: 0.1-1.0 K/uL 0.5  Eosinophils Absolute Latest Ref Range: 0.0-0.7 K/uL 0.2  Basophils Absolute Latest Ref Range: 0.0-0.1 K/uL 0.0          RADIOGRAPHIC STUDIES: I have personally reviewed the radiological images as listed and agreed with the findings in the report. CLINICAL DATA: Followup of lymphadenopathy. No current complaints. History of "Kidney stent coils ". Hypertension. Obesity. Gastroesophageal reflux disease.  EXAM: CT CHEST WITH CONTRAST  TECHNIQUE: Multidetector CT imaging of the chest was performed during intravenous contrast administration.  CONTRAST: 92m ISOVUE-300 IOPAMIDOL (ISOVUE-300) INJECTION 61%  COMPARISON: 12/15/2015 chest CT. Abdominal pelvic CT also 12/15/2015.  FINDINGS: Mediastinum/Nodes: Small supraclavicular and low jugular nodes are similar.  Increased number of bilateral subpectoral and axillary nodes again identified.  Index left axillary node measures 10 x 19 mm on image 54/series 2 versus 11 x 16 mm previously. Similar.  An index right subpectoral node measures 8 mm on image 43/series 2 and is unchanged.  Subcentimeter right thyroid hypo attenuating nodule is nonspecific. Tortuous thoracic aorta. Moderate cardiomegaly, without pericardial effusion. Pulmonary artery enlargement, including a 3.4 cm outflow tract. No central pulmonary embolism, on this non-dedicated  study. No mediastinal or hilar adenopathy. No internal mammary adenopathy.  Lungs/Pleura: No pleural fluid. Clear lungs.  Upper abdomen: Normal imaged portions of the liver, spleen, stomach, pancreas, adrenal glands, right kidney. An upper pole left renal 12 mm lesion is consistent with a cyst.  Incompletely imaged right renal artery peripherally calcified saccular aneurysm is similar, 2.1 cm.  Musculoskeletal: Thoracolumbar spondylosis.  IMPRESSION: 1. Since 12/15/2015, similar number and size of bilateral axillary and subpectoral nodes. Although these could be reactive, an indolent lymphoproliferative process cannot be excluded. Consider imaging surveillance at 6-9 months to confirm ongoing stability. 2. 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 3. Pulmonary artery enlargement suggests pulmonary arterial hypertension. 4. Cardiomegaly   Electronically Signed  By: KAbigail MiyamotoM.D.  On: 03/15/2016 11:01  [facility]   Status: Final result       PACS Images     Show images for [facility]     Study Result     CLINICAL DATA: Recent resection of right groin lymph node now with significant swelling  EXAM: ULTRASOUND right LOWER EXTREMITY LIMITED  TECHNIQUE: Ultrasound examination of the lower extremity soft tissues was performed in the area of clinical concern.  COMPARISON: None  FINDINGS: Ultrasound over the right groin was performed. There is a complex structure present measuring 5.5 x 4.8 x 7.3 cm. No blood flow is seen within this complex structure, and therefore this is most consistent with a hematoma. Clinical followup is recommended.  IMPRESSION: Complex structure in the right groin most consistent with hematoma. Recommend clinical followup.   Electronically Signed  By: PIvar DrapeM.D.  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 At this point I feel it is reasonable to discharge her back to [facility]. I anticipate that over time her adenopathy should resolve.     R groin ultrasound was obtained. This was discussed with Dr. JArnoldo Morale  He is going to see the patient today.  She  is agreeable.  I have given her a prescription for keflex and diflucan.   She was instructed to call if symptoms worse or if new adenopathy develops. All pathologic findings however reveal a reactive process without any evidence of malignancy.  This document serves as a record of services personally performed by Ancil Linsey, MD. It was created on her behalf by Toni Amend, a trained medical scribe. The creation of this record is based on the scribe's personal observations and the provider's statements to them. This document has been checked and approved by the attending provider.  I have reviewed the above documentation for accuracy and completeness, and I agree with the above.  This note was electronically signed.  Molli Hazard, MD  04/25/2016 12:51 PM

## 2016-04-27 ENCOUNTER — Ambulatory Visit
Admission: RE | Admit: 2016-04-27 | Discharge: 2016-04-27 | Disposition: A | Discharge status: Home / Self Care | Payer: BLUE CROSS/BLUE SHIELD | Source: Ambulatory Visit | Attending: Interventional Radiology | Admitting: Interventional Radiology

## 2016-04-27 ENCOUNTER — Ambulatory Visit (HOSPITAL_COMMUNITY)
Admission: RE | Admit: 2016-04-27 | Discharge: 2016-04-27 | Disposition: A | Discharge status: Home / Self Care | Payer: BLUE CROSS/BLUE SHIELD | Source: Ambulatory Visit | Attending: Interventional Radiology | Admitting: Interventional Radiology

## 2016-04-27 DIAGNOSIS — I722 Aneurysm of renal artery: Secondary | ICD-10-CM

## 2016-04-27 DIAGNOSIS — K5730 Diverticulosis of large intestine without perforation or abscess without bleeding: Secondary | ICD-10-CM | POA: Diagnosis not present

## 2016-04-27 HISTORY — PX: IR GENERIC HISTORICAL: IMG1180011

## 2016-04-27 MED ORDER — IOPAMIDOL (ISOVUE-370) INJECTION 76%
100.0000 mL | Freq: Once | INTRAVENOUS | Status: AC | PRN
  Administered 2016-04-27: 100 mL via INTRAVENOUS

## 2016-04-27 NOTE — Progress Notes (Signed)
Chief Complaint: Patient was seen in consultation today for  Chief Complaint  Patient presents with  . Follow-up    3 mo follow up Right Renal Artery Aneurym Stent Placement   at the request of [place]  Referring Physician(s): Jolynn Bajorek  History of Present Illness: Allison Hart is a 55 y.o. 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 She underwent endovascular repair with placement of coaxial overlapping pipeline flow diverting stents earlier this spring and now presents for scheduled 3 month follow-up.  Medically, Allison Hart is asymptomatic. She denies flank pain, hematuria or dysuria. Since my intervention she has undergone an excisional biopsy of a right inguinal lymph node. She is having some issues with fluid accumulation and drainage at this site which may represent a postoperative seroma or lymphocele. This is being managed by her surgeon.   Past Medical History  Diagnosis Date  . GERD (gastroesophageal reflux disease)   . Essential hypertension, benign   . Obesity   . Murmur   . Hypercholesteremia   . Herpes   . Sinusitis   . Urinary frequency     Past Surgical History  Procedure Laterality Date  . Umbilical hernia repair  infancy   . Left inguinal hernia repair and endometriosis  2007    No definitive records   . Left parotid bx benign  2005  . Embolization of uterine artery x 2    . Colonoscopy  11/13/2012    Procedure: COLONOSCOPY;  Surgeon: Rogene Houston, MD;  Location: [facility];  Service: Endoscopy;  Laterality: N/A;  930  . Radiology with anesthesia N/A 01/18/2016    Procedure: RADIOLOGY WITH ANESTHESIA;  Surgeon: Luanne Bras, MD;  Location: [place];  Service: Radiology;  Laterality: N/A;  . Renal artery aneurysm repair right Right 01/2016  . Inguinal lymph node biopsy Right 04/14/2016    Procedure: RIGHT INGUINAL LYMPH NODE BIOPSY;  Surgeon: Aviva Signs, MD;   Location: [facility];  Service: General;  Laterality: Right;    Allergies: Versed  Medications: Prior to Admission medications   Medication Sig Start Date End Date Taking? Authorizing Provider  acyclovir (ZOVIRAX) 800 MG tablet TAKE 1 TABLET BY MOUTH TWICE A DAY 02/15/16   Fayrene Helper, MD  amLODipine (NORVASC) 5 MG tablet TAKE 1 TABLET BY MOUTH EVERY DAY 02/07/16   Fayrene Helper, MD  aspirin 325 MG tablet Take 325 mg by mouth daily.    Historical Provider, MD  atorvastatin (LIPITOR) 20 MG tablet TAKE 1 TABLET BY MOUTH EVERY DAY 12/27/15   Fayrene Helper, MD  azelastine (ASTELIN) 0.1 % nasal spray Place 2 sprays into both nostrils 2 (two) times daily. Use in each nostril as directed Patient taking differently: Place 2 sprays into both nostrils 2 (two) times daily as needed (chronic sinusitis). Use in each nostril as directed 11/09/15   Fayrene Helper, MD  cephALEXin (KEFLEX) 500 MG capsule Take 1 capsule (500 mg total) by mouth 4 (four) times daily. 04/25/16   Patrici Ranks, MD  clopidogrel (PLAVIX) 75 MG tablet Take 1 tablet (75 mg total) by mouth daily. 03/22/16   Jacqulynn Cadet, MD  fluconazole (DIFLUCAN) 150 MG tablet Take one tablet by mouth at onset of yeast infection then repeat in 3 days 04/25/16   Patrici Ranks, MD  fluticasone (FLONASE) 50 MCG/ACT nasal spray PLACE 2 SPRAYS INTO BOTH NOSTRILS DAILY. 11/08/15   Norwood Levo  Moshe Cipro, MD  HYDROcodone-acetaminophen (NORCO/VICODIN) 5-325 MG tablet Take 1-2 tablets by mouth every 6 (six) hours as needed for moderate pain. 04/14/16 04/14/17  Aviva Signs, MD  loratadine (CLARITIN) 10 MG tablet TAKE ONE TABLET BY MOUTH ONCE DAILY 03/07/16   Fayrene Helper, MD  triamterene-hydrochlorothiazide [facility]) 75-50 MG tablet TAKE 1 TABLET BY MOUTH EVERY DAY 11/05/15   Fayrene Helper, MD     Family History  Problem Relation Age of Onset  . Hypertension Mother   . Hyperlipidemia Mother   . Thyroid disease Mother   . Coronary  artery disease Mother   . Irregular heart beat Mother   . Fibroids Sister   . Hypertension Sister   . Ulcerative colitis Son   . Colon cancer Neg Hx     Social History   Social History  . Marital Status: Married    Spouse Name: N/A  . Number of Children: N/A  . Years of Education: N/A   Social History Main Topics  . Smoking status: Former Smoker -- 0.25 packs/day for 5 years    Types: Cigarettes  . Smokeless tobacco: Never Used     Comment: "15-20 years ago" 01/13/16  . Alcohol Use: Yes     Comment: occasionally  . Drug Use: No  . Sexual Activity: Not on file   Other Topics Concern  . Not on file   Social History Narrative     Review of Systems: A 12 point ROS discussed and pertinent positives are indicated in the HPI above.  All other systems are negative.  Review of Systems  Vital Signs: BP 138/89 mmHg  Pulse 89  Temp(Src) 97.9 F (36.6 C) (Oral)  Resp 14  SpO2 98%  LMP 01/10/2016  Physical Exam  Constitutional: She is oriented to person, place, and time. She appears well-developed and well-nourished. No distress.  HENT:  Head: Normocephalic and atraumatic.  Eyes: No scleral icterus.  Cardiovascular: Normal rate and regular rhythm.   Pulmonary/Chest: Effort normal.  Abdominal: Soft.  Neurological: She is alert and oriented to person, place, and time.  Skin: Skin is warm and dry.  Psychiatric: She has a normal mood and affect. Her behavior is normal.  Nursing note and vitals reviewed.    Imaging: Ct Angio Abdomen W/cm &/or Wo Contrast  04/27/2016  CLINICAL DATA:  55 year old female with complex right renal artery aneurysm now 3 months status post repair with flow diverting stent EXAM: CT ANGIOGRAPHY ABDOMEN TECHNIQUE: Multidetector CT imaging of the abdomen was performed using the standard protocol during bolus administration of intravenous contrast. Multiplanar reconstructed images including MIPs were obtained and reviewed to evaluate the vascular  anatomy. CONTRAST:  100 mL Isovue 370 COMPARISON:  Prior CTA abdomen and pelvis 12/24/2015 FINDINGS: VASCULAR Aorta: Normal caliber abdominal aorta. Mild tortuosity but no aneurysm or dissection. No significant atherosclerotic plaque. Celiac: Widely patent and unremarkable. No visceral artery aneurysm. SMA: Widely patent and unremarkable.  Replaced right hepatic artery. Renals: Single dominant renal arteries bilaterally. Patent flow diverting stent in the right main renal artery crossing the necks of both aneurysms. The smaller aneurysm remains essentially unchanged at 1.4 cm. There is faint peripheral thrombus formation. The larger aneurysm demonstrates some interval negative remodeling measuring only 2.2 cm today compared to 2.4 cm previously. There is developing crescentic thrombus which significantly decreases the opacified portion of the aneurysm. IMA: Patent and unremarkable. Inflow: Patent and unremarkable. Veins: No focal venous abnormality. NON-VASCULAR Lower Chest: The lung bases are clear. Visualized cardiac structures  are within normal limits for size. No pericardial effusion. Unremarkable visualized distal thoracic esophagus. Abdomen: Unremarkable CT appearance of the stomach, duodenum, spleen, adrenal glands and pancreas. Normal hepatic contour and morphology. No discrete hepatic lesion. Gallbladder is unremarkable. No intra or extrahepatic biliary ductal dilatation. Unremarkable appearance of the bilateral kidneys. No focal solid lesion, hydronephrosis or nephrolithiasis. Specifically, no evidence of ischemia or infarction involving the right kidney. Small simple cyst upper pole left kidney. Colonic diverticular disease without CT evidence of active inflammation. Bones/Soft Tissues: No acute fracture or aggressive appearing lytic or blastic osseous lesion. Review of the MIP images confirms the above findings. IMPRESSION: VASCULAR 1. Interval negative remodeling of the larger right renal artery  aneurysm which now measures 2.2 cm compared to 2.4 cm previously. Additionally, there is developing smooth wall adherent thrombus occupying approximately 2/3 its of the aneurysm volume. 2. Stable appearance of the smaller 1.4 cm right renal artery aneurysm. NON VASCULAR 1. Colonic diverticular disease without CT evidence of active inflammation. Signed, Criselda Peaches, MD [facility] Electronically Signed   By: Jacqulynn Cadet M.D.   On: 04/27/2016 14:16   [place] Extrem Low Right Ltd  04/25/2016  CLINICAL DATA:  Recent resection of right groin lymph node now with significant swelling EXAM: ULTRASOUND right LOWER EXTREMITY LIMITED TECHNIQUE: Ultrasound examination of the lower extremity soft tissues was performed in the area of clinical concern. COMPARISON:  None FINDINGS: Ultrasound over the right groin was performed. There is a complex structure present measuring 5.5 x 4.8 x 7.3 cm. No blood flow is seen within this complex structure, and therefore this is most consistent with a hematoma. Clinical followup is recommended. IMPRESSION: Complex structure in the right groin most consistent with hematoma. Recommend clinical followup. Electronically Signed   By: Ivar Drape M.D.   On: 04/25/2016 11:37    Labs:  CBC:  Recent Labs  01/04/16 1100 01/18/16 0657 01/19/16 0536 04/10/16 1300  WBC 3.7* 4.4 4.6 4.8  HGB 11.9* 11.6* 10.5* 11.3*  HCT 37.8 37.7 33.1* 36.6  PLT 223 190 184 244    COAGS:  Recent Labs  01/04/16 1100 01/18/16 0657  INR 1.09 1.17  APTT 30 31    BMP:  Recent Labs  01/18/16 0657 01/19/16 0536 01/28/16 1529 04/10/16 1300 04/24/16 1048  NA 140 141 141 137  --   K 2.9* 3.1* 3.8 3.2*  --   CL 102 102 102 98*  --   CO2 26 29 30 31   --   GLUCOSE 90 99 114* 75  --   BUN 9 7 13 11 11   CALCIUM 9.2 8.5* 9.0 8.7*  --   CREATININE 0.61 0.66 0.75 0.67 0.91  GFRNONAA >60 >60 >89 >60 72  GFRAA >60 >60 >89 >60 83     LIVER FUNCTION TESTS:  Recent Labs  11/01/15 0739  BILITOT 0.7  AST 32  ALT 32*  ALKPHOS 97  PROT 7.3  ALBUMIN 4.1    TUMOR MARKERS: No results for input(s): AFPTM, CEA, CA199, CHROMGRNA in the last 8760 hours.  Assessment and Plan:  Excellent progress on 3 month follow-up CT arteriogram today. There has been successful negative remodeling of the larger renal artery aneurysm in the smaller one remains unchanged.  1.) Continue Plavix for an additional 3 months (6 months total)  2.) Return clinic visit in 6 months with CTA abdomen.    Electronically Signed: Jacqulynn Cadet 04/27/2016, 3:12 PM   I spent a  total of  15 Minutes in face to face in clinical consultation, greater than 50% of which was counseling/coordinating care forComplex right-sided renal artery aneurysm.

## 2016-04-28 ENCOUNTER — Encounter (HOSPITAL_COMMUNITY): Payer: Self-pay | Admitting: Hematology & Oncology

## 2016-05-02 ENCOUNTER — Other Ambulatory Visit: Payer: Self-pay | Admitting: Family Medicine

## 2016-05-03 ENCOUNTER — Other Ambulatory Visit: Payer: Self-pay | Admitting: Family Medicine

## 2016-05-05 ENCOUNTER — Other Ambulatory Visit: Payer: Self-pay | Admitting: Family Medicine

## 2016-05-22 ENCOUNTER — Other Ambulatory Visit: Payer: Self-pay | Admitting: Family Medicine

## 2016-05-22 ENCOUNTER — Telehealth: Payer: Self-pay

## 2016-05-22 DIAGNOSIS — I10 Essential (primary) hypertension: Secondary | ICD-10-CM

## 2016-05-22 DIAGNOSIS — E785 Hyperlipidemia, unspecified: Secondary | ICD-10-CM

## 2016-05-22 LAB — COMPREHENSIVE METABOLIC PANEL
ALBUMIN: 4.2 g/dL (ref 3.6–5.1)
ALK PHOS: 88 U/L (ref 33–130)
ALT: 20 U/L (ref 6–29)
AST: 24 U/L (ref 10–35)
BILIRUBIN TOTAL: 0.7 mg/dL (ref 0.2–1.2)
BUN: 12 mg/dL (ref 7–25)
CALCIUM: 9.1 mg/dL (ref 8.6–10.4)
CO2: 34 mmol/L — AB (ref 20–31)
CREATININE: 0.85 mg/dL (ref 0.50–1.05)
Chloride: 96 mmol/L — ABNORMAL LOW (ref 98–110)
GLUCOSE: 76 mg/dL (ref 65–99)
Potassium: 3.2 mmol/L — ABNORMAL LOW (ref 3.5–5.3)
SODIUM: 139 mmol/L (ref 135–146)
Total Protein: 7.3 g/dL (ref 6.1–8.1)

## 2016-05-22 LAB — TSH: TSH: 0.69 mIU/L

## 2016-05-22 LAB — CBC
HEMATOCRIT: 39.4 % (ref 35.0–45.0)
Hemoglobin: 12.4 g/dL (ref 11.7–15.5)
MCH: 27.9 pg (ref 27.0–33.0)
MCHC: 31.5 g/dL — ABNORMAL LOW (ref 32.0–36.0)
MCV: 88.7 fL (ref 80.0–100.0)
MPV: 9.4 fL (ref 7.5–12.5)
Platelets: 241 10*3/uL (ref 140–400)
RBC: 4.44 MIL/uL (ref 3.80–5.10)
RDW: 13.6 % (ref 11.0–15.0)
WBC: 4.4 10*3/uL (ref 3.8–10.8)

## 2016-05-22 LAB — LIPID PANEL
CHOL/HDL RATIO: 3 ratio (ref <=5.0)
CHOLESTEROL: 169 mg/dL (ref 125–200)
HDL: 56 mg/dL (ref >=46)
LDL Cholesterol: 98 mg/dL (ref <130)
Triglycerides: 75 mg/dL (ref <150)
VLDL: 15 mg/dL (ref <30)

## 2016-05-22 NOTE — Telephone Encounter (Signed)
Labs ordered.

## 2016-05-26 LAB — HEPATITIS C ANTIBODY: HCV Ab: NEGATIVE

## 2016-05-29 ENCOUNTER — Ambulatory Visit (INDEPENDENT_AMBULATORY_CARE_PROVIDER_SITE_OTHER): Payer: BLUE CROSS/BLUE SHIELD | Admitting: Family Medicine

## 2016-05-29 ENCOUNTER — Encounter: Payer: Self-pay | Admitting: Family Medicine

## 2016-05-29 ENCOUNTER — Other Ambulatory Visit (HOSPITAL_COMMUNITY)
Admission: RE | Admit: 2016-05-29 | Discharge: 2016-05-29 | Disposition: A | Discharge status: Home / Self Care | Payer: BLUE CROSS/BLUE SHIELD | Source: Ambulatory Visit | Attending: Family Medicine | Admitting: Family Medicine

## 2016-05-29 VITALS — BP 142/76 | HR 80 | Resp 18 | Ht 69.0 in | Wt 216.0 lb

## 2016-05-29 DIAGNOSIS — Z124 Encounter for screening for malignant neoplasm of cervix: Secondary | ICD-10-CM | POA: Diagnosis not present

## 2016-05-29 DIAGNOSIS — Z1211 Encounter for screening for malignant neoplasm of colon: Secondary | ICD-10-CM

## 2016-05-29 DIAGNOSIS — R599 Enlarged lymph nodes, unspecified: Secondary | ICD-10-CM

## 2016-05-29 DIAGNOSIS — Z1151 Encounter for screening for human papillomavirus (HPV): Secondary | ICD-10-CM | POA: Diagnosis not present

## 2016-05-29 DIAGNOSIS — Z23 Encounter for immunization: Secondary | ICD-10-CM | POA: Diagnosis not present

## 2016-05-29 DIAGNOSIS — Z01419 Encounter for gynecological examination (general) (routine) without abnormal findings: Secondary | ICD-10-CM | POA: Insufficient documentation

## 2016-05-29 DIAGNOSIS — Z0000 Encounter for general adult medical examination without abnormal findings: Secondary | ICD-10-CM | POA: Diagnosis not present

## 2016-05-29 DIAGNOSIS — I722 Aneurysm of renal artery: Secondary | ICD-10-CM

## 2016-05-29 DIAGNOSIS — R591 Generalized enlarged lymph nodes: Secondary | ICD-10-CM

## 2016-05-29 DIAGNOSIS — I10 Essential (primary) hypertension: Secondary | ICD-10-CM | POA: Diagnosis not present

## 2016-05-29 LAB — POC HEMOCCULT BLD/STL (OFFICE/1-CARD/DIAGNOSTIC): FECAL OCCULT BLD: NEGATIVE

## 2016-05-29 MED ORDER — POTASSIUM CHLORIDE CRYS ER 20 MEQ PO TBCR: 20.0000 meq | EXTENDED_RELEASE_TABLET | Freq: Every day | ORAL | 5 refills | Status: DC

## 2016-05-29 NOTE — Patient Instructions (Addendum)
F/u in 6 month, call if you need me before  Thankful that tests are good sio far  Will call you after I speak with the radiologist re follow up  Flu vaccine today  Take potassium one daily sent in  Non fast chem 7 in 5.5 month Please work on good  health habits so that your health will improve. 1. Commitment to daily physical activity for 30 to 60  minutes, if you are able to do this.  2. Commitment to wise food choices. Aim for half of your  food intake to be vegetable and fruit, one quarter starchy foods, and one quarter protein. Try to eat on a regular schedule  3 meals per day, snacking between meals should be limited to vegetables or fruits or small portions of nuts. 64 ounces of water per day is generally recommended, unless you have specific health conditions, like heart failure or kidney failure where you will need to limit fluid intake.  3. Commitment to sufficient and a  good quality of physical and mental rest daily, generally between 6 to 8 hours per day.  WITH PERSISTANCE AND PERSEVERANCE, THE IMPOSSIBLE , BECOMES THE NORM! Thank you  for choosing [facility]. We consider it a privelige to serve you.  Delivering excellent health care in a caring and  compassionate way is our goal.  Partnering with you,  so that together we can achieve this goal is our strategy.

## 2016-05-31 LAB — CYTOLOGY - PAP

## 2016-06-03 ENCOUNTER — Encounter: Payer: Self-pay | Admitting: Family Medicine

## 2016-06-03 DIAGNOSIS — Z0000 Encounter for general adult medical examination without abnormal findings: Secondary | ICD-10-CM | POA: Insufficient documentation

## 2016-06-03 DIAGNOSIS — R599 Enlarged lymph nodes, unspecified: Secondary | ICD-10-CM | POA: Insufficient documentation

## 2016-06-03 DIAGNOSIS — R591 Generalized enlarged lymph nodes: Secondary | ICD-10-CM | POA: Insufficient documentation

## 2016-06-03 HISTORY — DX: Enlarged lymph nodes, unspecified: R59.9

## 2016-06-03 NOTE — Progress Notes (Signed)
    Allison Hart     MRN: [id number]      DOB: 1961-06-01  HPI: Patient is in for annual physical exam. Reviewed with patient her extensive tests which she has had since last seen for lymphadenopathy, now on blood thinner for clot seen in renal artery, need clarification as to proposed follow up including length of treatment and re imaging plan Recent labs, if available are reviewed. Immunization is reviewed , and  updated if needed.   PE: Pleasant  female, alert and oriented x 3, in no cardio-pulmonary distress. Afebrile. HEENT No facial trauma or asymetry. Sinuses non tender.  Extra occullar muscles intact, pupils equally reactive to light. External ears normal, tympanic membranes clear. Oropharynx moist, no exudate. Neck: supple, no adenopathy,JVD or thyromegaly.No bruits.  Chest: Clear to ascultation bilaterally.No crackles or wheezes. Non tender to palpation  Breast: No asymetry,no masses or lumps. No tenderness. No nipple discharge or inversion. Axillary  and supraclavicular adenopathy present  Cardiovascular system; Heart sounds normal,  S1 and  S2 ,no S3.  No murmur, or thrill. Apical beat not displaced Peripheral pulses normal.  Abdomen: Soft, non tender, no organomegaly or masses. No bruits. Bowel sounds normal. No guarding, tenderness or rebound.  Rectal:  Normal sphincter tone. No rectal mass. Guaiac negative stool.  GU: External genitalia normal female genitalia , normal female distribution of hair. Bilateral inguinal adenopathy, resolving right groin swelling from prior biopsy site Urethral meatus normal in size, no  Prolapse, no lesions visibly  Present. Bladder non tender. Vagina pink and moist , with no visible lesions , discharge present . Adequate pelvic support no  cystocele or rectocele noted Cervix pink and appears healthy, no lesions or ulcerations noted, no discharge noted from os Uterus normal size, no adnexal masses, no cervical motion  or adnexal tenderness.   Musculoskeletal exam: Full ROM of spine, hips , shoulders and knees. No deformity ,swelling or crepitus noted. No muscle wasting or atrophy.   Neurologic: Cranial nerves 2 to 12 intact. Power, tone ,sensation and reflexes normal throughout. No disturbance in gait. No tremor.  Skin: Intact, no ulceration, erythema , scaling or rash noted. Pigmentation normal throughout  Psych; Normal mood and affect. Judgement and concentration normal   Assessment & Plan:  Annual physical exam Annual exam as documented. Counseling done  re healthy lifestyle involving commitment to 150 minutes exercise per week, heart healthy diet, and attaining healthy weight.The importance of adequate sleep also discussed. Regular seat belt use and home safety, is also discussed. Changes in health habits are decided on by the patient with goals and time frames  set for achieving them. Immunization and cancer screening needs are specifically addressed at this visit.   Need for prophylactic vaccination and inoculation against influenza After obtaining informed consent, the vaccine is  administered by LPN.   Adenopathy Will require ongoing oncology surveillance , and appropriate f/u already in place  Renal artery aneurysm [facility]) Currently on blood thinner after intervention by  Radiology in 0000000, need to ascertain f/u with treating MD

## 2016-06-03 NOTE — Assessment & Plan Note (Signed)
Currently on blood thinner after intervention by  Radiology in 0000000, need to ascertain f/u with treating MD

## 2016-06-03 NOTE — Assessment & Plan Note (Signed)
Will require ongoing oncology surveillance , and appropriate f/u already in place

## 2016-06-03 NOTE — Assessment & Plan Note (Signed)
After obtaining informed consent, the vaccine is  administered by LPN.  

## 2016-06-03 NOTE — Assessment & Plan Note (Signed)

## 2016-06-05 ENCOUNTER — Telehealth: Payer: Self-pay | Admitting: Family Medicine

## 2016-06-05 NOTE — Telephone Encounter (Signed)
Noted recorded on prior visit

## 2016-06-05 NOTE — Telephone Encounter (Signed)
Pls let her know  that the radiologist has been in touch with me. She will be contacted from his office for a f/u in January, and he will continue to monitor the aneurysm

## 2016-06-14 NOTE — Telephone Encounter (Signed)
Patient aware.

## 2016-07-07 ENCOUNTER — Encounter: Payer: Self-pay | Admitting: Interventional Radiology

## 2016-08-02 ENCOUNTER — Other Ambulatory Visit: Payer: Self-pay | Admitting: Family Medicine

## 2016-09-02 ENCOUNTER — Other Ambulatory Visit: Payer: Self-pay | Admitting: Family Medicine

## 2016-10-03 ENCOUNTER — Telehealth: Payer: Self-pay

## 2016-10-03 NOTE — Telephone Encounter (Signed)
Patient aware.

## 2016-10-03 NOTE — Telephone Encounter (Signed)
I recommend she directly contact prescribing mD as I am uncertain as to how long she nEEDS to be on plavix

## 2016-10-18 ENCOUNTER — Other Ambulatory Visit: Payer: Self-pay

## 2016-10-18 LAB — BASIC METABOLIC PANEL
BUN: 14 mg/dL (ref 7–25)
CALCIUM: 9.2 mg/dL (ref 8.6–10.4)
CO2: 39 mmol/L — AB (ref 20–31)
CREATININE: 0.95 mg/dL (ref 0.50–1.05)
Chloride: 93 mmol/L — ABNORMAL LOW (ref 98–110)
Glucose, Bld: 103 mg/dL — ABNORMAL HIGH (ref 65–99)
Potassium: 3.1 mmol/L — ABNORMAL LOW (ref 3.5–5.3)
SODIUM: 139 mmol/L (ref 135–146)

## 2016-10-18 MED ORDER — POTASSIUM CHLORIDE CRYS ER 20 MEQ PO TBCR: 20.0000 meq | EXTENDED_RELEASE_TABLET | Freq: Two times a day (BID) | ORAL | 5 refills | Status: DC

## 2016-11-08 ENCOUNTER — Other Ambulatory Visit: Payer: Self-pay | Admitting: Family Medicine

## 2016-11-09 ENCOUNTER — Other Ambulatory Visit (HOSPITAL_COMMUNITY): Payer: Self-pay | Admitting: Interventional Radiology

## 2016-11-09 DIAGNOSIS — I722 Aneurysm of renal artery: Secondary | ICD-10-CM

## 2016-11-15 ENCOUNTER — Other Ambulatory Visit: Payer: Self-pay | Admitting: Family Medicine

## 2016-11-15 DIAGNOSIS — Z1231 Encounter for screening mammogram for malignant neoplasm of breast: Secondary | ICD-10-CM

## 2016-11-20 ENCOUNTER — Encounter: Payer: Self-pay | Admitting: Family Medicine

## 2016-11-20 ENCOUNTER — Ambulatory Visit (INDEPENDENT_AMBULATORY_CARE_PROVIDER_SITE_OTHER): Payer: BLUE CROSS/BLUE SHIELD | Admitting: Family Medicine

## 2016-11-20 ENCOUNTER — Ambulatory Visit (HOSPITAL_COMMUNITY)
Admission: RE | Admit: 2016-11-20 | Discharge: 2016-11-20 | Disposition: A | Discharge status: Home / Self Care | Payer: BLUE CROSS/BLUE SHIELD | Source: Ambulatory Visit | Attending: Family Medicine | Admitting: Family Medicine

## 2016-11-20 VITALS — BP 140/98 | HR 81 | Resp 15 | Ht 69.0 in | Wt 210.0 lb

## 2016-11-20 DIAGNOSIS — Z1231 Encounter for screening mammogram for malignant neoplasm of breast: Secondary | ICD-10-CM | POA: Diagnosis present

## 2016-11-20 DIAGNOSIS — R591 Generalized enlarged lymph nodes: Secondary | ICD-10-CM

## 2016-11-20 DIAGNOSIS — G5603 Carpal tunnel syndrome, bilateral upper limbs: Secondary | ICD-10-CM

## 2016-11-20 DIAGNOSIS — I10 Essential (primary) hypertension: Secondary | ICD-10-CM | POA: Diagnosis not present

## 2016-11-20 DIAGNOSIS — E663 Overweight: Secondary | ICD-10-CM | POA: Diagnosis not present

## 2016-11-20 DIAGNOSIS — R599 Enlarged lymph nodes, unspecified: Secondary | ICD-10-CM

## 2016-11-20 DIAGNOSIS — J302 Other seasonal allergic rhinitis: Secondary | ICD-10-CM | POA: Diagnosis not present

## 2016-11-20 MED ORDER — LORATADINE 10 MG PO TABS: 10.0000 mg | ORAL_TABLET | Freq: Every day | ORAL | 3 refills | Status: DC

## 2016-11-20 NOTE — Patient Instructions (Addendum)
Annual exam in mid September, call if you need me before  It is important that you exercise regularly at least 30 minutes 5 times a week. If you develop chest pain, have severe difficulty breathing, or feel very tired, stop exercising immediately and seek medical attention   Please work on good  health habits so that your health will improve. 1. Commitment to daily physical activity for 30 to 60  minutes, if you are able to do this.  2. Commitment to wise food choices. Aim for half of your  food intake to be vegetable and fruit, one quarter starchy foods, and one quarter protein. Try to eat on a regular schedule  3 meals per day, snacking between meals should be limited to vegetables or fruits or small portions of nuts. 64 ounces of water per day is generally recommended, unless you have specific health conditions, like heart failure or kidney failure where you will need to limit fluid intake.  3. Commitment to sufficient and a  good quality of physical and mental rest daily, generally between 6 to 8 hours per day.  WITH PERSISTANCE AND PERSEVERANCE, THE IMPOSSIBLE , BECOMES THE NORM!  September,call if you need me before  Fasting lipid, chem 7, CBC, tSH, vit D early Sept ,   DASH Eating Plan DASH stands for "Dietary Approaches to Stop Hypertension." The DASH eating plan is a healthy eating plan that has been shown to reduce high blood pressure (hypertension). Additional health benefits may include reducing the risk of type 2 diabetes mellitus, heart disease, and stroke. The DASH eating plan may also help with weight loss. What do I need to know about the DASH eating plan? For the DASH eating plan, you will follow these general guidelines:  Choose foods with less than 150 milligrams of sodium per serving (as listed on the food label).  Use salt-free seasonings or herbs instead of table salt or sea salt.  Check with your health care provider or pharmacist before using salt  substitutes.  Eat lower-sodium products. These are often labeled as "low-sodium" or "no salt added."  Eat fresh foods. Avoid eating a lot of canned foods.  Eat more vegetables, fruits, and low-fat dairy products.  Choose whole grains. Look for the word "whole" as the first word in the ingredient list.  Choose fish and skinless chicken or [place] more often than red meat. Limit fish, poultry, and meat to 6 oz (170 g) each day.  Limit sweets, desserts, sugars, and sugary drinks.  Choose heart-healthy fats.  Eat more home-cooked food and less restaurant, buffet, and fast food.  Limit fried foods.  Do not fry foods. Cook foods using methods such as baking, boiling, grilling, and broiling instead.  When eating at a restaurant, ask that your food be prepared with less salt, or no salt if possible. What foods can I eat? Seek help from a dietitian for individual calorie needs. Grains  Whole grain or whole wheat bread. Brown rice. Whole grain or whole wheat pasta. Quinoa, bulgur, and whole grain cereals. Low-sodium cereals. Corn or whole wheat flour tortillas. Whole grain cornbread. Whole grain crackers. Low-sodium crackers. Vegetables  Fresh or frozen vegetables (raw, steamed, roasted, or grilled). Low-sodium or reduced-sodium tomato and vegetable juices. Low-sodium or reduced-sodium tomato sauce and paste. Low-sodium or reduced-sodium canned vegetables. Fruits  All fresh, canned (in natural juice), or frozen fruits. Meat and Other Protein Products  Ground beef (85% or leaner), grass-fed beef, or beef trimmed of fat. Skinless chicken or  [place]. Ground chicken or [place]. Pork trimmed of fat. All fish and seafood. Eggs. Dried beans, peas, or lentils. Unsalted nuts and seeds. Unsalted canned beans. Dairy  Low-fat dairy products, such as skim or 1% milk, 2% or reduced-fat cheeses, low-fat ricotta or cottage cheese, or plain low-fat yogurt. Low-sodium or reduced-sodium cheeses. Fats and Oils   Tub margarines without trans fats. Light or reduced-fat mayonnaise and salad dressings (reduced sodium). Avocado. Safflower, olive, or canola oils. Natural peanut or almond butter. Other  Unsalted popcorn and pretzels. The items listed above may not be a complete list of recommended foods or beverages. Contact your dietitian for more options.  What foods are not recommended? Grains  White bread. White pasta. White rice. Refined cornbread. Bagels and croissants. Crackers that contain trans fat. Vegetables  Creamed or fried vegetables. Vegetables in a cheese sauce. Regular canned vegetables. Regular canned tomato sauce and paste. Regular tomato and vegetable juices. Fruits  Canned fruit in light or heavy syrup. Fruit juice. Meat and Other Protein Products  Fatty cuts of meat. Ribs, chicken wings, bacon, sausage, bologna, salami, chitterlings, fatback, hot dogs, bratwurst, and packaged luncheon meats. Salted nuts and seeds. Canned beans with salt. Dairy  Whole or 2% milk, cream, half-and-half, and cream cheese. Whole-fat or sweetened yogurt. Full-fat cheeses or blue cheese. Nondairy creamers and whipped toppings. Processed cheese, cheese spreads, or cheese curds. Condiments  Onion and garlic salt, seasoned salt, table salt, and sea salt. Canned and packaged gravies. Worcestershire sauce. Tartar sauce. Barbecue sauce. Teriyaki sauce. Soy sauce, including reduced sodium. Steak sauce. Fish sauce. Oyster sauce. Cocktail sauce. Horseradish. Ketchup and mustard. Meat flavorings and tenderizers. Bouillon cubes. Hot sauce. Tabasco sauce. Marinades. Taco seasonings. Relishes. Fats and Oils  Butter, stick margarine, lard, shortening, ghee, and bacon fat. Coconut, palm kernel, or palm oils. Regular salad dressings. Other  Pickles and olives. Salted popcorn and pretzels. The items listed above may not be a complete list of foods and beverages to avoid. Contact your dietitian for more information.  Where can  I find more information? National Heart, Lung, and Blood Institute: travelstabloid.com This information is not intended to replace advice given to you by your health care provider. Make sure you discuss any questions you have with your health care provider. Document Released: 09/14/2011 Document Revised: 03/02/2016 Document Reviewed: 07/30/2013 Elsevier Interactive Patient Education  2017 [employer].

## 2016-11-21 ENCOUNTER — Other Ambulatory Visit: Payer: Self-pay

## 2016-11-21 MED ORDER — LORATADINE 10 MG PO TABS
10.0000 mg | ORAL_TABLET | Freq: Every day | ORAL | 3 refills | Status: DC
Start: 2016-11-21 — End: 2017-11-23

## 2016-11-23 ENCOUNTER — Other Ambulatory Visit: Payer: Self-pay | Admitting: Family Medicine

## 2016-11-23 NOTE — Assessment & Plan Note (Signed)
Discharged from oncology after negative workup in 2017, return prn

## 2016-11-23 NOTE — Assessment & Plan Note (Signed)
Controlled, no change in medication  

## 2016-11-23 NOTE — Assessment & Plan Note (Signed)
Elevated , pt has not been taking medication consistently, importance of same discussed No med chane DASH diet and commitment to daily physical activity for a minimum of 30 minutes discussed and encouraged, as a part of hypertension management. The importance of attaining a healthy weight is also discussed.  BP/Weight 11/20/2016 05/29/2016 04/27/2016 04/25/2016 04/14/2016 04/10/2016 [id number]  Systolic BP [id number]  Diastolic BP 98 76 89 81 92 84 93  Wt. (Lbs) 210 216 - 218 217 217 217  BMI 31.01 31.9 - 32.18 32.03 32.03 32.03

## 2016-11-23 NOTE — Assessment & Plan Note (Signed)
Worsening , especially in right han unable to do surgery currently

## 2016-11-23 NOTE — Progress Notes (Signed)
   Allison Hart     MRN: [id number]      DOB: 1961-06-08   HPI [name] is here for follow up and re-evaluation of chronic medical conditions, medication management and review of any available recent lab and radiology data.  Preventive health is updated, specifically  Cancer screening and Immunization.   Questions or concerns regarding consultations or procedures which the PT has had in the interim are  addressed. The PT denies any adverse reactions to current medications since the last visit.  There are no new concerns.  There are no specific complaints   ROS Denies recent fever or chills. Denies sinus pressure, nasal congestion, ear pain or sore throat. Denies chest congestion, productive cough or wheezing. Denies chest pains, palpitations and leg swelling Denies abdominal pain, nausea, vomiting,diarrhea or constipation.   Denies dysuria, frequency, hesitancy or incontinence. Denies joint pain, swelling and limitation in mobility. Denies headaches, seizures, numbness, or tingling. Denies depression, anxiety or insomnia. Denies skin break down or rash.   PE  BP (!) 140/98   Pulse 81   Resp 15   Ht 5\' 9"  (1.753 m)   Wt 210 lb (95.3 kg)   SpO2 97%   BMI 31.01 kg/m   Patient alert and oriented and in no cardiopulmonary distress.  HEENT: No facial asymmetry, EOMI,   oropharynx pink and moist.  Neck supple no JVD, no mass.  Chest: Clear to auscultation bilaterally.  CVS: S1, S2 no murmurs, no S3.Regular rate.  ABD: Soft non tender.   Ext: No edema  MS: Adequate ROM spine, shoulders, hips and knees.  Skin: Intact, no ulcerations or rash noted.  Psych: Good eye contact, normal affect. Memory intact not anxious or depressed appearing.  CNS: CN 2-12 intact, grade 3 power in grip of right hand with thenar wasting due to severe carpal  Tunnel syndrome  Assessment & Plan  Essential hypertension Elevated , pt has not been taking medication consistently, importance  of same discussed No med chane DASH diet and commitment to daily physical activity for a minimum of 30 minutes discussed and encouraged, as a part of hypertension management. The importance of attaining a healthy weight is also discussed.  BP/Weight 11/20/2016 05/29/2016 04/27/2016 04/25/2016 04/14/2016 04/10/2016 [id number]  Systolic BP [id number]  Diastolic BP 98 76 89 81 92 84 93  Wt. (Lbs) 210 216 - 218 217 217 217  BMI 31.01 31.9 - 32.18 32.03 32.03 32.03       Overweight (BMI 25.0-29.9) Improved Patient re-educated about  the importance of commitment to a  minimum of 150 minutes of exercise per week.  The importance of healthy food choices with portion control discussed. Encouraged to start a food diary, count calories and to consider  joining a support group. Sample diet sheets offered. Goals set by the patient for the next several months.   Weight /BMI 11/20/2016 05/29/2016 04/25/2016  WEIGHT 210 lb 216 lb 218 lb  HEIGHT 5\' 9"  5\' 9"  -  BMI 31.01 kg/m2 31.9 kg/m2 32.18 kg/m2      Allergic rhinitis Controlled, no change in medication   Adenopathy Discharged from oncology after negative workup in 2017, return prn  Carpal tunnel syndrome Worsening , especially in right han unable to do surgery currently

## 2016-11-23 NOTE — Assessment & Plan Note (Addendum)
Improved Patient re-educated about  the importance of commitment to a  minimum of 150 minutes of exercise per week.  The importance of healthy food choices with portion control discussed. Encouraged to start a food diary, count calories and to consider  joining a support group. Sample diet sheets offered. Goals set by the patient for the next several months.   Weight /BMI 11/20/2016 05/29/2016 04/25/2016  WEIGHT 210 lb 216 lb 218 lb  HEIGHT 5\' 9"  5\' 9"  -  BMI 31.01 kg/m2 31.9 kg/m2 32.18 kg/m2

## 2016-11-27 ENCOUNTER — Ambulatory Visit: Payer: BLUE CROSS/BLUE SHIELD | Admitting: Family Medicine

## 2016-12-05 ENCOUNTER — Other Ambulatory Visit: Payer: Self-pay | Admitting: Family Medicine

## 2016-12-05 ENCOUNTER — Ambulatory Visit (HOSPITAL_COMMUNITY)
Admission: RE | Admit: 2016-12-05 | Discharge: 2016-12-05 | Disposition: A | Discharge status: Home / Self Care | Payer: BLUE CROSS/BLUE SHIELD | Source: Ambulatory Visit | Attending: Interventional Radiology | Admitting: Interventional Radiology

## 2016-12-05 ENCOUNTER — Ambulatory Visit
Admission: RE | Admit: 2016-12-05 | Discharge: 2016-12-05 | Disposition: A | Discharge status: Home / Self Care | Payer: BLUE CROSS/BLUE SHIELD | Source: Ambulatory Visit | Attending: Interventional Radiology | Admitting: Interventional Radiology

## 2016-12-05 DIAGNOSIS — I722 Aneurysm of renal artery: Secondary | ICD-10-CM

## 2016-12-05 DIAGNOSIS — J3089 Other allergic rhinitis: Secondary | ICD-10-CM

## 2016-12-05 DIAGNOSIS — Z95828 Presence of other vascular implants and grafts: Secondary | ICD-10-CM | POA: Diagnosis not present

## 2016-12-05 HISTORY — PX: IR GENERIC HISTORICAL: IMG1180011

## 2016-12-05 MED ORDER — IOPAMIDOL (ISOVUE-370) INJECTION 76%
100.0000 mL | Freq: Once | INTRAVENOUS | Status: AC | PRN
  Administered 2016-12-05: 100 mL via INTRAVENOUS

## 2016-12-05 NOTE — Progress Notes (Signed)
Referring Physician(s): Simpson,M/Penland,S  Chief Complaint: The patient is seen in follow up today s/p pipeline stent placements of a bilobed right renal artery aneurysm in April 2017  History of present illness: Allison Hart is a 56 y.o. female with a complex bilobed right renal artery aneurysm who underwent endovascular repair with placement of coaxial overlapping pipeline flow diverting stents in April 2017 and now presents for scheduled 10 month follow up. She reports no major complaints at this time other than some intermittent right SI joint discomfort. She specifically denies fever, significant headache, respiratory difficulties, abdominal/groin pain, nausea, vomiting, hematuria or dysuria. She does have carpal tunnel syndrome in her right hand. She is currently off her Plavix.   Past Medical History:  Diagnosis Date  . Essential hypertension, benign   . GERD (gastroesophageal reflux disease)   . Herpes   . Hypercholesteremia   . Murmur   . Obesity   . Sinusitis   . Urinary frequency     Past Surgical History:  Procedure Laterality Date  . COLONOSCOPY  11/13/2012   Procedure: COLONOSCOPY;  Surgeon: Rogene Houston, MD;  [facility];  Service: Endoscopy;  Laterality: N/A;  930  . embolization of uterine artery X 2    . INGUINAL LYMPH NODE BIOPSY Right 04/14/2016   Procedure: RIGHT INGUINAL LYMPH NODE BIOPSY;  Surgeon: Aviva Signs, MD;  Location: [facility];  Service: General;  Laterality: Right;  . IR GENERIC HISTORICAL  04/27/2016   IR RADIOLOGIST EVAL & MGMT 04/27/2016 Jacqulynn Cadet, MD [facility] INTERV RAD  . Left inguinal hernia repair and endometriosis  2007   No definitive records   . Left parotid bx benign  2005  . RADIOLOGY WITH ANESTHESIA N/A 01/18/2016   Procedure: RADIOLOGY WITH ANESTHESIA;  Surgeon: Luanne Bras, MD;  Location: [place];  Service: Radiology;  Laterality: N/A;  . renal artery aneurysm repair right Right 01/2016  . UMBILICAL HERNIA  REPAIR  infancy     Allergies: Versed [midazolam]  Medications: Prior to Admission medications   Medication Sig Start Date End Date Taking? Authorizing Provider  acyclovir (ZOVIRAX) 800 MG tablet TAKE 1 TABLET BY MOUTH TWICE A DAY 02/15/16   Fayrene Helper, MD  amLODipine (NORVASC) 5 MG tablet TAKE 1 TABLET BY MOUTH EVERY DAY 11/08/16   Fayrene Helper, MD  aspirin 325 MG tablet Take 325 mg by mouth daily.    Historical Provider, MD  azelastine (ASTELIN) 0.1 % nasal spray PLACE 2 SPRAYS INTO BOTH NOSTRILS 2 (TWO) TIMES DAILY. USE IN EACH NOSTRIL AS DIRECTED 12/05/16   Fayrene Helper, MD  fluticasone [facility]) 50 MCG/ACT nasal spray INSTILL 2 SPRAYS INTO BOTH NOSTRILS DAILY 09/02/16   Fayrene Helper, MD  loratadine (CLARITIN) 10 MG tablet Take 1 tablet (10 mg total) by mouth daily. 11/21/16   Fayrene Helper, MD  potassium chloride SA (K-DUR,KLOR-CON) 20 MEQ tablet Take 1 tablet (20 mEq total) by mouth 2 (two) times daily. 10/18/16   Fayrene Helper, MD  triamterene-hydrochlorothiazide (MAXZIDE) 75-50 MG tablet TAKE 1 TABLET BY MOUTH EVERY DAY 11/23/16   Fayrene Helper, MD     Family History  Problem Relation Age of Onset  . Hypertension Mother   . Hyperlipidemia Mother   . Thyroid disease Mother   . Coronary artery disease Mother   . Irregular heart beat Mother   . Fibroids Sister   . Hypertension Sister   . Ulcerative colitis Son   . Colon  cancer Neg Hx     Social History   Social History  . Marital status: Married    Spouse name: N/A  . Number of children: N/A  . Years of education: N/A   Social History Main Topics  . Smoking status: Former Smoker    Packs/day: 0.25    Years: 5.00    Types: Cigarettes  . Smokeless tobacco: Never Used     Comment: "15-20 years ago" 01/13/16  . Alcohol use Yes     Comment: occasionally  . Drug use: No  . Sexual activity: Not on file   Other Topics Concern  . Not on file   Social History Narrative  . No narrative  on file     Vital Signs: BP (!) 131/91 (BP Location: Left Arm, Patient Position: Sitting, Cuff Size: Normal)   Pulse 86   Temp 98.7 F (37.1 C) (Oral)   Resp 14   Ht 5\' 9"  (1.753 m)   Wt 220 lb (99.8 kg)   SpO2 98%   BMI 32.49 kg/m   Physical Exam awake, alert. Chest clear to auscultation bilaterally. Heart with regular rate and rhythm. Abdomen soft, positive bowel sounds, nontender. Lower extremities with no edema.  Imaging: Ct Angio Abdomen W &/or Wo Contrast  Result Date: 12/05/2016 CLINICAL DATA:  10 month follow up endovascular repair w/stents of rt renal artery aneurysm; htn; no complaints today EXAM: CT ANGIOGRAPHY ABDOMEN TECHNIQUE: Multidetector CT imaging of the abdomen was performed using the standard protocol during bolus administration of intravenous contrast. Multiplanar reconstructed images and MIPs were obtained and reviewed to evaluate the vascular anatomy. CONTRAST:  100 mL Isovue 370 IV COMPARISON:  04/27/2016 FINDINGS: VASCULAR Aorta: Mild tortuosity. No dissection or stenosis. No significant atheromatous change. Celiac: Patent without evidence of aneurysm, dissection, vasculitis or significant stenosis. SMA: Patent without evidence of aneurysm, dissection, vasculitis or significant stenosis. Replaced right hepatic arterial supply, an anatomic variant. Renals: Single left, unremarkable. Single right, patent proximally. Patent flow diverting stent in the distal main renal artery extending into the anterior trunk. The anterior aneurysm with heavy peripheral calcification is stable in maximum transverse diameter 22 mm. There has been continued thrombosis within this aneurysm with a small residual of patent component probably related to distal supply to a small upper pole renal artery branch. The posterior moiety of the aneurysm complex also remains stable in size, 14 mm transverse diameter. There has been progressive thrombosis in the lumen of the aneurysm since previous scan  with continued flow into the posterior trunk of the right renal artery through the stent. IMA: Patent without evidence of aneurysm, dissection, vasculitis or significant stenosis. Inflow: Minimal calcified plaque in the visualized proximal right common iliac artery. Veins: Patent hepatic veins, portal vein, superior mesenteric vein, splenic vein, bilateral renal veins, IVC. Review of the MIP images confirms the above findings. NON-VASCULAR Lower chest: Minimal dependent atelectasis posteriorly in the visualized lung bases. No pleural or pericardial effusion. Hepatobiliary: No focal liver abnormality is seen. No gallstones, gallbladder wall thickening, or biliary dilatation. Pancreas: Unremarkable. No pancreatic ductal dilatation or surrounding inflammatory changes. Spleen: Normal in size without focal abnormality. Adrenals/Urinary Tract: Normal adrenal glands. Stable left upper pole renal cyst. Some parenchymal atrophy in the posterior right kidney extending into the upper pole probably related to diminished arterial flow in the posterior trunk of the right renal artery. No solid renal mass, hydronephrosis, or urolithiasis. Stomach/Bowel: Stomach and visualized portions of small bowel and colon unremarkable. Lymphatic: No adenopathy identified.  Other: No ascites.  No free air. Musculoskeletal: Mild spondylitic changes in the visualized lower thoracic spine. No fracture or worrisome osseous lesion IMPRESSION: VASCULAR 1. Continued patency of right renal artery stent, with progressive thrombosis within the anterior and posterior aneurysms at the renal artery bifurcation. NON-VASCULAR 1. No acute findings Electronically Signed   By: Lucrezia Europe M.D.   On: 12/05/2016 11:55    Labs:  CBC:  Recent Labs  01/18/16 0657 01/19/16 0536 04/10/16 1300 05/22/16 1403  WBC 4.4 4.6 4.8 4.4  HGB 11.6* 10.5* 11.3* 12.4  HCT 37.7 33.1* 36.6 39.4  PLT 190 184 244 241    COAGS:  Recent Labs  01/04/16 1100  01/18/16 0657  INR 1.09 1.17  APTT 30 31    BMP:  Recent Labs  01/19/16 0536 01/28/16 1529 04/10/16 1300 04/24/16 1048 05/22/16 1403 10/17/16 1140  NA 141 141 137  --  139 139  K 3.1* 3.8 3.2*  --  3.2* 3.1*  CL 102 102 98*  --  96* 93*  CO2 29 30 31   --  34* 39*  GLUCOSE 99 114* 75  --  76 103*  BUN 7 13 11 11 12 14   CALCIUM 8.5* 9.0 8.7*  --  9.1 9.2  CREATININE 0.66 0.75 0.67 0.91 0.85 0.95  GFRNONAA >60 >89 >60 72  --   --   GFRAA >60 >89 >60 83  --   --     LIVER FUNCTION TESTS:  Recent Labs  05/22/16 1403  BILITOT 0.7  AST 24  ALT 20  ALKPHOS 88  PROT 7.3  ALBUMIN 4.2    Assessment: Pt status post deployment of  pipeline flow diverting stents across neck of a bilobed right renal artery aneurysm in April 2017. Patient currently stable. Follow-up CT angiography of the abdomen today reveals continued patency of the right renal artery stents with progressive thrombosis within the anterior and posterior aneurysms at the renal artery bifurcation. No other acute findings. Continue current primary care with Dr. Moshe Cipro for hypertension management. Patient will be scheduled for follow-up with Dr.  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 She'll also be scheduled for follow-up IR clinic visit and CT angiography of the abdomen in 1 year. She was told to contact our service in the interim with any additional questions or concerns.   Signed: D. Rowe Robert 12/05/2016, 2:42 PM   Please refer to Dr. Katrinka Blazing attestation of this note for management and plan.      Patient ID: Allison Hart, female   DOB: May 20, 1961, 56 y.o.   MRN: [id number]

## 2016-12-06 ENCOUNTER — Other Ambulatory Visit (HOSPITAL_COMMUNITY): Payer: Self-pay | Admitting: Interventional Radiology

## 2016-12-06 DIAGNOSIS — I729 Aneurysm of unspecified site: Secondary | ICD-10-CM

## 2016-12-15 ENCOUNTER — Encounter: Payer: Self-pay | Admitting: Interventional Radiology

## 2017-01-22 ENCOUNTER — Ambulatory Visit (HOSPITAL_COMMUNITY)
Admission: RE | Admit: 2017-01-22 | Discharge: 2017-01-22 | Disposition: A | Discharge status: Home / Self Care | Payer: BLUE CROSS/BLUE SHIELD | Source: Ambulatory Visit | Attending: Interventional Radiology | Admitting: Interventional Radiology

## 2017-01-22 ENCOUNTER — Other Ambulatory Visit (HOSPITAL_COMMUNITY): Payer: Self-pay | Admitting: Radiology

## 2017-01-22 ENCOUNTER — Other Ambulatory Visit (HOSPITAL_COMMUNITY): Payer: Self-pay | Admitting: Interventional Radiology

## 2017-01-22 DIAGNOSIS — I729 Aneurysm of unspecified site: Secondary | ICD-10-CM

## 2017-01-22 DIAGNOSIS — I671 Cerebral aneurysm, nonruptured: Secondary | ICD-10-CM

## 2017-01-22 HISTORY — PX: IR RADIOLOGIST EVAL & MGMT: IMG5224

## 2017-01-22 MED ORDER — CLOPIDOGREL BISULFATE 75 MG PO TABS: 75.0000 mg | ORAL_TABLET | Freq: Every day | ORAL | 0 refills | Status: DC

## 2017-01-23 ENCOUNTER — Other Ambulatory Visit: Payer: Self-pay

## 2017-01-23 ENCOUNTER — Encounter (HOSPITAL_COMMUNITY): Payer: Self-pay | Admitting: Interventional Radiology

## 2017-01-23 ENCOUNTER — Telehealth: Payer: Self-pay | Admitting: Family Medicine

## 2017-01-23 MED ORDER — POTASSIUM CHLORIDE CRYS ER 20 MEQ PO TBCR: 20.0000 meq | EXTENDED_RELEASE_TABLET | Freq: Two times a day (BID) | ORAL | 5 refills | Status: DC

## 2017-01-23 NOTE — Telephone Encounter (Signed)
Patient calling to let you know that she is being scheduled for angiogram w/intent to treat for an aneurysm on right side or brain.    Patient is also calling in reference to the Potassium Rx that should be written for 2x per day, but instead  It is for only 30 tabs. She would like to have 60 for the month.

## 2017-01-23 NOTE — Telephone Encounter (Signed)
Corrected quantity sent 

## 2017-01-27 ENCOUNTER — Other Ambulatory Visit: Payer: Self-pay | Admitting: Family Medicine

## 2017-02-07 ENCOUNTER — Encounter (HOSPITAL_COMMUNITY): Payer: Self-pay

## 2017-02-07 ENCOUNTER — Other Ambulatory Visit (HOSPITAL_COMMUNITY): Payer: Self-pay | Admitting: *Deleted

## 2017-02-07 ENCOUNTER — Encounter (HOSPITAL_COMMUNITY)
Admission: RE | Admit: 2017-02-07 | Discharge: 2017-02-07 | Disposition: A | Discharge status: Home / Self Care | Payer: BLUE CROSS/BLUE SHIELD | Source: Ambulatory Visit | Attending: Interventional Radiology | Admitting: Interventional Radiology

## 2017-02-07 DIAGNOSIS — E785 Hyperlipidemia, unspecified: Secondary | ICD-10-CM | POA: Insufficient documentation

## 2017-02-07 DIAGNOSIS — I722 Aneurysm of renal artery: Secondary | ICD-10-CM | POA: Insufficient documentation

## 2017-02-07 DIAGNOSIS — J309 Allergic rhinitis, unspecified: Secondary | ICD-10-CM | POA: Diagnosis not present

## 2017-02-07 DIAGNOSIS — G5600 Carpal tunnel syndrome, unspecified upper limb: Secondary | ICD-10-CM | POA: Insufficient documentation

## 2017-02-07 DIAGNOSIS — I10 Essential (primary) hypertension: Secondary | ICD-10-CM | POA: Insufficient documentation

## 2017-02-07 DIAGNOSIS — E663 Overweight: Secondary | ICD-10-CM | POA: Diagnosis not present

## 2017-02-07 DIAGNOSIS — R590 Localized enlarged lymph nodes: Secondary | ICD-10-CM | POA: Insufficient documentation

## 2017-02-07 DIAGNOSIS — Z01812 Encounter for preprocedural laboratory examination: Secondary | ICD-10-CM | POA: Diagnosis present

## 2017-02-07 DIAGNOSIS — R9430 Abnormal result of cardiovascular function study, unspecified: Secondary | ICD-10-CM | POA: Insufficient documentation

## 2017-02-07 HISTORY — DX: Unspecified osteoarthritis, unspecified site: M19.90

## 2017-02-07 HISTORY — DX: Anxiety disorder, unspecified: F41.9

## 2017-02-07 LAB — CBC
HEMATOCRIT: 37.6 % (ref 36.0–46.0)
Hemoglobin: 11.9 g/dL — ABNORMAL LOW (ref 12.0–15.0)
MCH: 28.7 pg (ref 26.0–34.0)
MCHC: 31.6 g/dL (ref 30.0–36.0)
MCV: 90.6 fL (ref 78.0–100.0)
Platelets: 225 10*3/uL (ref 150–400)
RBC: 4.15 MIL/uL (ref 3.87–5.11)
RDW: 14.4 % (ref 11.5–15.5)
WBC: 4 10*3/uL (ref 4.0–10.5)

## 2017-02-07 LAB — BASIC METABOLIC PANEL
ANION GAP: 5 (ref 5–15)
BUN: 11 mg/dL (ref 6–20)
CHLORIDE: 95 mmol/L — AB (ref 101–111)
CO2: 38 mmol/L — ABNORMAL HIGH (ref 22–32)
Calcium: 9.4 mg/dL (ref 8.9–10.3)
Creatinine, Ser: 0.94 mg/dL (ref 0.44–1.00)
Glucose, Bld: 104 mg/dL — ABNORMAL HIGH (ref 65–99)
POTASSIUM: 2.9 mmol/L — AB (ref 3.5–5.1)
SODIUM: 138 mmol/L (ref 135–145)

## 2017-02-07 NOTE — Progress Notes (Signed)
PCP  Allison Hart  No Cardiologist    ECHO stress done in 2013 Denies any recent chest pain discomfort

## 2017-02-07 NOTE — Pre-Procedure Instructions (Signed)
Allison Hart  02/07/2017      [place], [place] [address] Phone: [phone] Fax: [phone]  [id number] [place], [place] [address] Phone: [phone] Fax: [phone]  [id number] - [place], [place] [address] [facility] [address] [place] [address] Phone: [phone] Fax: [phone]  [place] [facility] [place], [place] - [address] [place] [address] [place] [address] [place] [address] Phone: [phone] Fax: [phone]    Your procedure is scheduled on 02-12-2017  Monday   Report to [place] at [place].M.   Call this number if you have problems the morning of surgery:  [phone]   Remember:  Do not eat food or drink liquids after midnight.   Take these medicines the morning of surgery with A SIP OF WATER Amlodipine(Norvasc),Aspirin,azelastine nasal spray,clopidogrel)Plavix),Flonase nasal spray,Potasium chloride(K-Dur),   Do not wear jewelry, make-up or nail polish.  Do not wear lotions, powders, or perfumes, or deoderant.  Do not shave 48 hours prior to surgery.  Men may shave face and neck.   Do not bring valuables to the hospital.  [facility] is not responsible for any belongings or valuables.  Contacts, dentures or bridgework may not be worn into surgery.  Leave your suitcase in the car.  After surgery it may be brought to your room.  For patients admitted to the hospital, discharge time will be determined by your treatment team.  Patients discharged the day of surgery will not be allowed to drive home.    [place] - Preparing for Surgery  Before surgery, you can play an important role.  Because skin is not sterile, your skin needs to be as free of germs as possible.  You can reduce the number of germs on you skin by washing with CHG (chlorahexidine  gluconate) soap before surgery.  CHG is an antiseptic cleaner which kills germs and bonds with the skin to continue killing germs even after washing.  Please DO NOT use if you have an allergy to CHG or antibacterial soaps.  If your skin becomes reddened/irritated stop using the CHG and inform your nurse when you arrive at Short Stay.  Do not shave (including legs and underarms) for at least 48 hours prior to the first CHG shower.  You may shave your face.  Please follow these instructions carefully:   1.  Shower with CHG Soap the night before surgery and the   morning of Surgery.  2.  If you choose to wash your hair, wash your hair first as usual with your   normal shampoo.  3.  After you shampoo, rinse your hair and body thoroughly to remove the                      Shampoo.  4.  Use CHG as you would any other liquid soap.  You can apply chg directly       to the skin and wash gently with scrungie or a clean washcloth.  5.  Apply the CHG Soap to your body ONLY FROM THE NECK DOWN  Do not use on open wounds or open sores.  Avoid contact with your eyes,   ears, mouth and genitals (private parts).  Wash genitals (private parts)  with your normal  soap.  6.  Wash thoroughly, paying special attention to the area where your surgery  will be performed.  7.  Thoroughly rinse your body with warm water from the neck down.  8.  DO NOT shower/wash with your normal soap after using and rinsing off the CHG Soap.  9.  Pat yourself dry with a clean towel.            10.  Wear clean pajamas.            11.  Place clean sheets on your bed the night of your first shower and do not sleep with pets.  Day of Surgery  Do not apply any lotions/deoderants the morning of surgery.  Please wear clean clothes to the hospital/surgery center.    Please read over the following fact sheets that you were given. Surgical Site Infection Prevention

## 2017-02-08 ENCOUNTER — Other Ambulatory Visit: Payer: Self-pay | Admitting: Radiology

## 2017-02-08 NOTE — Progress Notes (Signed)
Anesthesia Chart Review: Patient is a 56 year old female scheduled for IR procedure by Dr. Estanislado Pandy on 02/12/17. She has a right PCOM aneurysm.  Other history includes former smoker, GERD, HTN, murmur (no murmur noted 11/20/16 exam), hypercholesterolemia, left IHR, embolization of uterine artery X 2, right renal artery complex bilobed aneurysm s/p pipeline stent 01/18/16, right inguinal LN biopsy 04/14/16 (reactive lymphoid hyperplasia; T cells with non-specific changes; no monoclonal B cell population seen). BMI is consistent with mild obesity.   PCP is Dr. Tula Nakayama, last visit 11/23/16. Hematologist is Dr. Ancil Linsey, last visit 04/25/16 for follow-up lymphadenopathy. Following LN pathology results, he referred her back to Dr. Moshe Cipro for on-going care.  Meds include Zovirax PRN, amlodipine, ASA 81 mg, Lipitor, Plavix, Flonase, Claritin, KCl, Vitamin B6, triamterene-HCTZ.   BP (!) 154/86   Pulse 89   Temp 36.7 C   Resp 20   Ht 5\' 9"  (1.753 m)   Wt 208 lb 4.8 oz (94.5 kg)   SpO2 98%   BMI 30.76 kg/m    EKG 04/10/16: SR with PACs, moderate voltage criteria for LVH, may be normal variant. Non-specific T wave abnormality. Low r waves in inferior leads III, aVF (old). Overall I think tracing appears stable.  Stress Echo 04/01/12 [facility]): There was no clearly diagnostic wall motion abnormalities to indicate ischemia based on review of all images obtained during the study. Resting EF 55-60%.  CT chest 03/15/16: IMPRESSION: 1. Since 12/15/2015, similar number and size of bilateral axillary and subpectoral nodes. Although these could be reactive, an indolent lymphoproliferative process cannot be excluded. Consider imaging surveillance at 6-9 months to confirm ongoing stability. 2. 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 3. Pulmonary artery enlargement suggests pulmonary arterial hypertension. 4. Cardiomegaly. (I did send Dr. Moshe Cipro a staff message  regarding chest CT results for follow-up as she felt indicated.)  CTA abd/pelvis 12/05/16: IMPRESSION: VASCULAR 1. Continued patency of right renal artery stent, with progressive thrombosis within the anterior and posterior aneurysms at the renal artery bifurcation. NON-VASCULAR 1. No acute findings  Preoperative labs noted. K 2.9 (previously 3.1-3.2 since 04/10/16), Cr 0.94. H/H 11.9/37.6. Glucose 104. Orders were pending at PAT, so additional lab orders on the day of surgery. Will also need to repeat K+. I notified Anderson Malta with IR of low potassium and need for treatment prior to procedure. We also discussed that any additional IR labs will need to be done day of surgery.  Above reviewed with anesthesiologist Dr. Linna Caprice since 2017 chest CT showed PA enlargement. Patient is not on home oxygen. No acute cardiopulmonary issues documented at her last PCP visit. She has had two procedures with anesthesia in the last 13 months. If no acute changes and hypokalemia improved then it is anticipated that she can proceed as planned from an anesthesia standpoint.   George Hugh [facility] Short Stay Center/Anesthesiology Phone [phone] 02/08/2017 3:40 PM

## 2017-02-09 ENCOUNTER — Other Ambulatory Visit: Payer: Self-pay | Admitting: Radiology

## 2017-02-11 ENCOUNTER — Encounter (HOSPITAL_COMMUNITY): Payer: Self-pay | Admitting: Anesthesiology

## 2017-02-11 NOTE — Anesthesia Preprocedure Evaluation (Addendum)
Anesthesia Evaluation  Patient identified by MRN, date of birth, ID band Patient awake    Reviewed: Allergy & Precautions, NPO status , Patient's Chart, lab work & pertinent test results  Airway Mallampati: III  TM Distance: >3 FB Neck ROM: Full    Dental  (+) Teeth Intact, Dental Advisory Given   Pulmonary neg pulmonary ROS, former smoker,    breath sounds clear to auscultation       Cardiovascular hypertension, + Peripheral Vascular Disease  negative cardio ROS  + Valvular Problems/Murmurs  Rhythm:Regular Rate:Normal     Neuro/Psych Anxiety  Neuromuscular disease    GI/Hepatic Neg liver ROS, GERD  ,  Endo/Other  negative endocrine ROS  Renal/GU Renal disease  negative genitourinary   Musculoskeletal  (+) Arthritis , Osteoarthritis,    Abdominal   Peds negative pediatric ROS (+)  Hematology negative hematology ROS (+)   Anesthesia Other Findings Day of surgery medications reviewed with the patient.  Reproductive/Obstetrics negative OB ROS                            Lab Results  Component Value Date   WBC 4.0 02/07/2017   HGB 11.9 (L) 02/07/2017   HCT 37.6 02/07/2017   MCV 90.6 02/07/2017   PLT 225 02/07/2017   Lab Results  Component Value Date   CREATININE 0.94 02/07/2017   BUN 11 02/07/2017   NA 138 02/07/2017   K 2.9 (L) 02/07/2017   CL 95 (L) 02/07/2017   CO2 38 (H) 02/07/2017   Lab Results  Component Value Date   INR 1.17 01/18/2016   INR 1.09 01/04/2016   EKG: NSR w/ PAC's  Anesthesia Physical Anesthesia Plan  ASA: III  Anesthesia Plan: General   Post-op Pain Management:    Induction: Intravenous  Airway Management Planned: Oral ETT  Additional Equipment: Arterial line  Intra-op Plan:   Post-operative Plan: Possible Post-op intubation/ventilation  Informed Consent: I have reviewed the patients History and Physical, chart, labs and discussed the  procedure including the risks, benefits and alternatives for the proposed anesthesia with the patient or authorized representative who has indicated his/her understanding and acceptance.   Dental advisory given  Plan Discussed with: CRNA  Anesthesia Plan Comments:         Anesthesia Quick Evaluation

## 2017-02-12 ENCOUNTER — Ambulatory Visit (HOSPITAL_COMMUNITY)
Admission: RE | Admit: 2017-02-12 | Discharge: 2017-02-12 | Disposition: A | Discharge status: Home / Self Care | Payer: BLUE CROSS/BLUE SHIELD | Source: Ambulatory Visit | Attending: Interventional Radiology | Admitting: Interventional Radiology

## 2017-02-12 ENCOUNTER — Other Ambulatory Visit: Payer: Self-pay

## 2017-02-12 ENCOUNTER — Encounter (HOSPITAL_COMMUNITY)
Admission: RE | Disposition: A | Discharge status: Home / Self Care | Payer: Self-pay | Source: Ambulatory Visit | Attending: Interventional Radiology

## 2017-02-12 ENCOUNTER — Encounter (HOSPITAL_COMMUNITY): Payer: Self-pay | Admitting: Anesthesiology

## 2017-02-12 ENCOUNTER — Ambulatory Visit (HOSPITAL_COMMUNITY): Payer: BLUE CROSS/BLUE SHIELD | Admitting: Vascular Surgery

## 2017-02-12 DIAGNOSIS — I671 Cerebral aneurysm, nonruptured: Secondary | ICD-10-CM

## 2017-02-12 DIAGNOSIS — I739 Peripheral vascular disease, unspecified: Secondary | ICD-10-CM | POA: Insufficient documentation

## 2017-02-12 DIAGNOSIS — E669 Obesity, unspecified: Secondary | ICD-10-CM | POA: Insufficient documentation

## 2017-02-12 DIAGNOSIS — Z87891 Personal history of nicotine dependence: Secondary | ICD-10-CM

## 2017-02-12 DIAGNOSIS — I6521 Occlusion and stenosis of right carotid artery: Secondary | ICD-10-CM | POA: Diagnosis not present

## 2017-02-12 DIAGNOSIS — I10 Essential (primary) hypertension: Secondary | ICD-10-CM

## 2017-02-12 DIAGNOSIS — Z7982 Long term (current) use of aspirin: Secondary | ICD-10-CM

## 2017-02-12 DIAGNOSIS — K219 Gastro-esophageal reflux disease without esophagitis: Secondary | ICD-10-CM | POA: Insufficient documentation

## 2017-02-12 DIAGNOSIS — I720 Aneurysm of carotid artery: Secondary | ICD-10-CM

## 2017-02-12 DIAGNOSIS — R51 Headache: Secondary | ICD-10-CM | POA: Insufficient documentation

## 2017-02-12 DIAGNOSIS — M1990 Unspecified osteoarthritis, unspecified site: Secondary | ICD-10-CM

## 2017-02-12 DIAGNOSIS — F419 Anxiety disorder, unspecified: Secondary | ICD-10-CM | POA: Insufficient documentation

## 2017-02-12 HISTORY — PX: IR ANGIO INTRA EXTRACRAN SEL COM CAROTID INNOMINATE BILAT MOD SED: IMG5360

## 2017-02-12 HISTORY — PX: RADIOLOGY WITH ANESTHESIA: SHX6223

## 2017-02-12 HISTORY — PX: IR ANGIO VERTEBRAL SEL VERTEBRAL BILAT MOD SED: IMG5369

## 2017-02-12 LAB — COMPREHENSIVE METABOLIC PANEL
ALBUMIN: 3.9 g/dL (ref 3.5–5.0)
ALT: 28 U/L (ref 14–54)
AST: 40 U/L (ref 15–41)
Alkaline Phosphatase: 72 U/L (ref 38–126)
Anion gap: 8 (ref 5–15)
BUN: 16 mg/dL (ref 6–20)
CHLORIDE: 97 mmol/L — AB (ref 101–111)
CO2: 32 mmol/L (ref 22–32)
Calcium: 9.1 mg/dL (ref 8.9–10.3)
Creatinine, Ser: 0.85 mg/dL (ref 0.44–1.00)
GFR calc Af Amer: >60 mL/min (ref >60)
Glucose, Bld: 90 mg/dL (ref 65–99)
POTASSIUM: 2.9 mmol/L — AB (ref 3.5–5.1)
SODIUM: 137 mmol/L (ref 135–145)
Total Bilirubin: 0.4 mg/dL (ref 0.3–1.2)
Total Protein: 7.3 g/dL (ref 6.5–8.1)

## 2017-02-12 LAB — CBC WITH DIFFERENTIAL/PLATELET
BASOS ABS: 0 10*3/uL (ref 0.0–0.1)
BASOS PCT: 1 %
EOS ABS: 0.3 10*3/uL (ref 0.0–0.7)
Eosinophils Relative: 9 %
HCT: 35.8 % — ABNORMAL LOW (ref 36.0–46.0)
Hemoglobin: 11.2 g/dL — ABNORMAL LOW (ref 12.0–15.0)
Lymphocytes Relative: 24 %
Lymphs Abs: 0.9 10*3/uL (ref 0.7–4.0)
MCH: 28.1 pg (ref 26.0–34.0)
MCHC: 31.3 g/dL (ref 30.0–36.0)
MCV: 89.9 fL (ref 78.0–100.0)
Monocytes Absolute: 0.6 10*3/uL (ref 0.1–1.0)
Monocytes Relative: 15 %
NEUTROS ABS: 1.9 10*3/uL (ref 1.7–7.7)
NEUTROS PCT: 51 %
PLATELETS: 224 10*3/uL (ref 150–400)
RBC: 3.98 MIL/uL (ref 3.87–5.11)
RDW: 14.2 % (ref 11.5–15.5)
WBC: 3.7 10*3/uL — AB (ref 4.0–10.5)

## 2017-02-12 LAB — APTT: aPTT: 33 seconds (ref 24–36)

## 2017-02-12 LAB — PLATELET INHIBITION P2Y12: PLATELET FUNCTION P2Y12: 61 [PRU] — AB (ref 194–418)

## 2017-02-12 LAB — PROTIME-INR
INR: 1.09
PROTHROMBIN TIME: 14.2 s (ref 11.4–15.2)

## 2017-02-12 SURGERY — RADIOLOGY WITH ANESTHESIA
Anesthesia: Monitor Anesthesia Care

## 2017-02-12 MED ORDER — SODIUM CHLORIDE 0.9 % IV SOLN: INTRAVENOUS | Status: DC

## 2017-02-12 MED ORDER — SODIUM CHLORIDE 0.9 % IV SOLN
INTRAVENOUS | Status: DC
  Administered 2017-02-12: 08:00:00 via INTRAVENOUS

## 2017-02-12 MED ORDER — CLOPIDOGREL BISULFATE 75 MG PO TABS
75.0000 mg | ORAL_TABLET | ORAL | Status: DC
  Filled 2017-02-12: qty 1

## 2017-02-12 MED ORDER — IOPAMIDOL (ISOVUE-300) INJECTION 61%
INTRAVENOUS | Status: AC
  Administered 2017-02-12: 30 mL
  Filled 2017-02-12: qty 150

## 2017-02-12 MED ORDER — ASPIRIN 81 MG PO CHEW
CHEWABLE_TABLET | ORAL | Status: AC
Start: 2017-02-12 — End: 2017-02-12
  Administered 2017-02-12: 243 mg via ORAL
  Filled 2017-02-12: qty 3

## 2017-02-12 MED ORDER — ASPIRIN 81 MG PO CHEW
243.0000 mg | CHEWABLE_TABLET | Freq: Once | ORAL | Status: AC
  Administered 2017-02-12: 243 mg via ORAL

## 2017-02-12 MED ORDER — LABETALOL HCL 5 MG/ML IV SOLN
INTRAVENOUS | Status: DC | PRN
  Administered 2017-02-12: 10 mg via INTRAVENOUS
  Administered 2017-02-12: 5 mg via INTRAVENOUS

## 2017-02-12 MED ORDER — POTASSIUM CHLORIDE 10 MEQ/50ML IV SOLN
10.0000 meq | INTRAVENOUS | Status: AC
  Administered 2017-02-12 (×2): 10 meq via INTRAVENOUS
  Filled 2017-02-12 (×3): qty 50

## 2017-02-12 MED ORDER — LIDOCAINE HCL 1 % IJ SOLN
INTRAMUSCULAR | Status: AC
  Filled 2017-02-12: qty 20

## 2017-02-12 MED ORDER — SODIUM CHLORIDE 0.9 % IV SOLN: INTRAVENOUS | Status: AC

## 2017-02-12 MED ORDER — ASPIRIN 81 MG PO CHEW
CHEWABLE_TABLET | ORAL | Status: AC
  Filled 2017-02-12: qty 3

## 2017-02-12 MED ORDER — LACTATED RINGERS IV SOLN
INTRAVENOUS | Status: DC | PRN
  Administered 2017-02-12: 08:00:00 via INTRAVENOUS

## 2017-02-12 MED ORDER — LIDOCAINE HCL 1 % IJ SOLN
INTRAMUSCULAR | Status: AC | PRN
  Administered 2017-02-12: 10 mL

## 2017-02-12 MED ORDER — NIMODIPINE 30 MG PO CAPS
0.0000 mg | ORAL_CAPSULE | ORAL | Status: AC
  Administered 2017-02-12: 60 mg via ORAL
  Filled 2017-02-12 (×2): qty 2

## 2017-02-12 MED ORDER — CEFAZOLIN SODIUM-DEXTROSE 2-4 GM/100ML-% IV SOLN
2.0000 g | INTRAVENOUS | Status: AC
  Administered 2017-02-12: 2 g via INTRAVENOUS
  Filled 2017-02-12 (×2): qty 100

## 2017-02-12 MED ORDER — HEPARIN SODIUM (PORCINE) 1000 UNIT/ML IJ SOLN
INTRAMUSCULAR | Status: DC | PRN
  Administered 2017-02-12: 1000 [IU] via INTRAVENOUS

## 2017-02-12 MED ORDER — ASPIRIN EC 325 MG PO TBEC
325.0000 mg | DELAYED_RELEASE_TABLET | ORAL | Status: DC
  Filled 2017-02-12: qty 1

## 2017-02-12 MED ORDER — FENTANYL CITRATE (PF) 100 MCG/2ML IJ SOLN
INTRAMUSCULAR | Status: DC | PRN
  Administered 2017-02-12 (×3): 50 ug via INTRAVENOUS

## 2017-02-12 NOTE — Transfer of Care (Signed)
Immediate Anesthesia Transfer of Care Note  Patient: Allison Hart  Procedure(s) Performed: Procedure(s): ANUERYSM EMBOLIZATION (N/A)  Patient Location: PACU  Anesthesia Type:MAC  Level of Consciousness: awake, alert , oriented and patient cooperative  Airway & Oxygen Therapy: Patient Spontanous Breathing  Post-op Assessment: Report given to RN, Post -op Vital signs reviewed and stable and Patient moving all extremities  Post vital signs: Reviewed and stable  Last Vitals:  Vitals:   02/12/17 0730 02/12/17 0824  BP: (!) 146/94 (!) 165/104  Pulse: 77 74  Resp:    Temp:      Last Pain:  Vitals:   02/12/17 0656  TempSrc: Oral      Patients Stated Pain Goal: 2 (11/65/79 0383)  Complications: No apparent anesthesia complications

## 2017-02-12 NOTE — Procedures (Addendum)
S/P 4 vessel cerebral arteriogram RT CFA approach. Findings. 1.Approx 2.5 mm RT PCOM infundibulum. 2.50 % stenosi sof mid one third of cervical RT ICA

## 2017-02-12 NOTE — Sedation Documentation (Signed)
Anesthesia case 

## 2017-02-12 NOTE — Anesthesia Postprocedure Evaluation (Addendum)
Anesthesia Post Note  Patient: Allison Hart  Procedure(s) Performed: Procedure(s) (LRB): ANUERYSM EMBOLIZATION (N/A)  Patient location during evaluation: PACU Anesthesia Type: MAC Level of consciousness: awake and alert Pain management: pain level controlled Vital Signs Assessment: post-procedure vital signs reviewed and stable Respiratory status: spontaneous breathing, nonlabored ventilation, respiratory function stable and patient connected to nasal cannula oxygen Cardiovascular status: stable and blood pressure returned to baseline Anesthetic complications: no       Last Vitals:  Vitals:   02/12/17 1045 02/12/17 1055  BP: 125/90 124/87  Pulse:  66  Resp:  16  Temp: 36.9 C     Last Pain:  Vitals:   02/12/17 0656  TempSrc: Oral                 Effie Berkshire

## 2017-02-12 NOTE — H&P (Signed)
Chief Complaint: Patient was seen in consultation today for cerebral arteriogram with possible Right posterior communicating artery aneurysm embolization at the request of Dr Bari Mantis  Referring Physician(s): Dr Bari Mantis  Supervising Physician: Luanne Bras  Patient Status: [facility] - Out-pt  History of Present Illness: Allison Hart is a 56 y.o. 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 MRI 01/2016 IMPRESSION: 1. 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 2. 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 3. 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 Now scheduled for cerebral arteriogram with possible embolization R PCOM aneurysm  Denies neurologic symptoms Denies headache; dizziness; visual or speech issues Denies weakness; numbness; tingling of extremities   Past Medical History:  Diagnosis Date  . Anxiety   . Arthritis   . Essential hypertension, benign   . GERD (gastroesophageal reflux disease)   . Herpes   . Murmur   . Obesity   . Sinusitis   . Urinary frequency     Past Surgical History:  Procedure Laterality Date  . COLONOSCOPY  11/13/2012   Procedure: COLONOSCOPY;  Surgeon: Rogene Houston, MD;  Location: [facility] ENDO SUITE;  Service: Endoscopy;  Laterality: N/A;  930  . embolization of uterine artery X 2    . INGUINAL LYMPH NODE BIOPSY Right 04/14/2016   Procedure: RIGHT INGUINAL LYMPH NODE BIOPSY;  Surgeon: Aviva Signs, MD;  Location: [facility] ORS;  Service: General;  Laterality: Right;  . IR GENERIC HISTORICAL  04/27/2016   IR RADIOLOGIST EVAL & MGMT  04/27/2016 Jacqulynn Cadet, MD [facility] INTERV RAD  . IR GENERIC HISTORICAL  12/05/2016   IR RADIOLOGIST EVAL & MGMT 12/05/2016 Jacqulynn Cadet, MD [facility] INTERV RAD  . IR RADIOLOGIST EVAL & MGMT  01/22/2017  . Left inguinal hernia repair and endometriosis  2007   No definitive records   . Left parotid bx benign  2005  . RADIOLOGY WITH ANESTHESIA N/A 01/18/2016   Procedure: RADIOLOGY WITH ANESTHESIA;  Surgeon: Luanne Bras, MD;  Location: [facility];  Service: Radiology;  Laterality: N/A;  . renal artery aneurysm repair right Right 01/2016  . UMBILICAL HERNIA REPAIR  infancy     Allergies: Versed [midazolam]  Medications: Prior to Admission medications   Medication Sig Start Date End Date Taking? Authorizing Provider  amLODipine (NORVASC) 5 MG tablet TAKE 1 TABLET BY MOUTH EVERY DAY 11/08/16  Yes Fayrene Helper, MD  aspirin EC 81 MG tablet Take 81 mg by mouth daily.   Yes [provider]  azelastine (ASTELIN) 0.1 % nasal spray PLACE 2 SPRAYS INTO BOTH NOSTRILS 2 (TWO) TIMES DAILY. USE IN EACH NOSTRIL AS DIRECTED 12/05/16  Yes Fayrene Helper, MD  cholecalciferol (VITAMIN D) 1000 units tablet Take 1,000 Units by mouth daily.   Yes [provider]  clopidogrel (PLAVIX) 75 MG tablet Take 1 tablet (75 mg total) by mouth daily. Start medication on February 05, 2017 01/22/17 04/22/17 Yes Deveshwar, Willaim Rayas, MD  fluticasone [facility]) 50 MCG/ACT nasal spray INSTILL 2 SPRAYS INTO BOTH NOSTRILS DAILY 09/02/16  Yes Fayrene Helper, MD  loratadine (CLARITIN) 10 MG tablet Take 1 tablet (10 mg total) by mouth daily.  11/21/16  Yes Fayrene Helper, MD  potassium chloride SA (K-DUR,KLOR-CON) 20 MEQ tablet Take 1 tablet (20 mEq total) by mouth 2 (two) times daily. 01/23/17  Yes Fayrene Helper, MD  Pyridoxine HCl (VITAMIN B-6 PO) Take 1 tablet by mouth daily.   Yes [provider]  triamterene-hydrochlorothiazide (MAXZIDE) 75-50 MG tablet TAKE 1 TABLET BY MOUTH EVERY DAY  11/23/16  Yes Fayrene Helper, MD  acyclovir (ZOVIRAX) 800 MG tablet TAKE 1 TABLET BY MOUTH TWICE A DAY Patient taking differently: TAKE 1 TABLET BY MOUTH TWICE A DAY AS NEEDED 02/15/16   Fayrene Helper, MD     Family History  Problem Relation Age of Onset  . Hypertension Mother   . Hyperlipidemia Mother   . Thyroid disease Mother   . Coronary artery disease Mother   . Irregular heart beat Mother   . Fibroids Sister   . Hypertension Sister   . Ulcerative colitis Son   . Colon cancer Neg Hx     Social History   Social History  . Marital status: Married    Spouse name: N/A  . Number of children: N/A  . Years of education: N/A   Social History Main Topics  . Smoking status: Former Smoker    Packs/day: 0.25    Years: 5.00    Types: Cigarettes    Quit date: 1997  . Smokeless tobacco: Never Used  . Alcohol use Yes     Comment: occasionally  . Drug use: No  . Sexual activity: Not Asked   Other Topics Concern  . None   Social History Narrative  . None    Review of Systems: A 12 point ROS discussed and pertinent positives are indicated in the HPI above.  All other systems are negative.  Review of Systems  Constitutional: Negative for activity change, appetite change, fatigue and fever.  HENT: Negative for tinnitus, trouble swallowing and voice change.   Eyes: Negative for visual disturbance.  Respiratory: Negative for cough and shortness of breath.   Cardiovascular: Negative for chest pain.  Gastrointestinal: Negative for abdominal pain.  Musculoskeletal: Negative for back pain and gait problem.  Neurological: Negative for dizziness, tremors, seizures, syncope, facial asymmetry, speech difficulty, weakness, light-headedness, numbness and headaches.  Psychiatric/Behavioral: Negative for behavioral problems and confusion.    Vital Signs: BP (!) 146/94   Pulse 77   Temp 98.2 F (36.8 C) (Oral)   Resp 20   LMP 09/22/2016   SpO2 97%   Physical Exam    Constitutional: She is oriented to person, place, and time. She appears well-nourished.  HENT:  Head: Atraumatic.  Eyes: EOM are normal.  Neck: Neck supple.  Cardiovascular: Normal rate, regular rhythm and normal heart sounds.   Pulmonary/Chest: Effort normal and breath sounds normal. She has no wheezes.  Abdominal: Soft. Bowel sounds are normal. There is no tenderness.  Musculoskeletal: Normal range of motion. She exhibits no edema or tenderness.  Neurological: She is alert and oriented to person, place, and time.  Skin: Skin is warm and dry.  Psychiatric: She has a normal mood and affect. Her behavior is normal. Judgment and thought content normal.  Nursing note and vitals reviewed.   Mallampati Score:  MD Evaluation Airway: WNL Heart: WNL Abdomen: WNL Chest/ Lungs: WNL ASA  Classification: 2 Mallampati/Airway Score: One  Imaging: Ir Radiologist Eval & Mgmt  Result Date: 01/23/2017 EXAM: NEW PATIENT OFFICE VISIT CHIEF COMPLAINT: Occasional headaches. Discovery of probable intracranial aneurysm involving the right  intracranial anterior circulation. Current Pain Level: 1-10 HISTORY OF PRESENT ILLNESS: The patient is a 56 year old right-handed lady who has been referred for evaluation of a recently discovered probable intracranial aneurysm. She does complain of occasional headaches although these are not out of the extraordinary for her. The patient's intracranial aneurysm was apparently noted on an MRI scan of the brain performed on 11/29/2015. This revealed apparently an approximately 2-2.5 mm outpouching in the right posterior communicating artery region. No other aneurysms were noted on the MRI MRA of the brain. Clinically, she denies any symptoms of unusual sudden severe headaches, loss of consciousness, seizures, vertigo, perioral numbness, swallowing difficulties, gait instability, visual problems of diplopia, blindness, speech difficulties, or motor or sensory lateralizing  features. Constitutionally, she denies any recent chills, fever or rigors. Her appetite is normal.  Her weight is steady. The patient denies any symptoms pertainable to chest pain, shortness of breath, paroxysmal nocturnal dyspnea, pedal edema or palpitations. She denies any unusual cough which is productive, wheezing or hemoptysis. She denies any nausea, vomiting, diarrhea, constipation or melena. Past Medical History: Essential hypertension, gastroesophageal reflux, genital herpes, hypercholesterolemia, murmur, obesity, sinusitis and urinary frequency. Past surgical history of having had a colonoscopy performed, inguinal lymph node biopsy, inguinal hernia repair on the left side. History of endometriosis, also drainage of a cyst from the left parotid gland. She has had also a left inguinal hernia repair in the past. Medications: Acyclovir, amlodipine, aspirin 325 mg a day, Astelin 0.1 nasal spray, Flonase, Claritin, potassium chloride supplementation, triamterene-hydrochlorothiazide combination. Allergies: Versed apparently causes her to become jittery and feverish. Social History: Married. Has one child alive and well. Has three siblings, two sisters with heart disease, and one brother with COPD. She denies smoking cigarettes having stopped 20 years ago. Denies drinking alcohol or using illicit chemicals. Family History: No family history of intracranial aneurysms or of abdominal aortic aneurysms. REVIEW OF SYSTEMS: Negative unless as mentioned above. PHYSICAL EXAMINATION: In no acute distress.  Affect normal. Neurologically, alert, awake and oriented to time, place, space. Speech and comprehension normal. No lateralizing cranial nerve abnormalities, or motor, sensory, coordination or station and gait abnormalities. ASSESSMENT AND PLAN: The patient's recent MRI of the brain and MRA of the brain was reviewed with her. The outpouching in the right posterior communicating artery region measuring approximately 2  mm-2.5 mm was brought to her attention. She was informed that this probably represented an aneurysm. However, a more definitive study in the form of diagnostic catheter arteriogram will be needed to be absolutely sure. Should this reveal that she does have an aneurysm, the patient was informed that this could be either managed conservatively with routine surveillance with MRA examination of the head at yearly intervals versus consideration of obliteration of the aneurysm from the circulation to prevent future rupture. The risk of rupture should this truly represent an intracranial aneurysm was 1-2% per year with increased the risk given the patient's gender, hypertension and hypercholesterolemia. The patient does not have a family history of intracranial aneurysms. Additionally she does not smoke. Should the patient have a confirmed intracranial aneurysm, the treatment options were those of endovascular obliteration with primary coiling, versus stent assisted coiling versus treatment with a flow diverter device versus consideration of neurosurgical consultation with possible clipping. The patient has expressed her desire to have this aneurysm treated if it were confirmed to be an aneurysm. She would like to have this treated endovascularly. The procedure, the risks, benefits and alternatives as above were reviewed in  detail. Risk of a complication of thromboembolic stroke versus a remote possibility of intra procedural rupture of 1% in combination was reviewed with the patient. Questions were answered to her satisfaction. The patient has expressed her desire to proceed with a diagnostic catheter angiogram, and with endovascular intervention if indicated under general anesthesia. Meanwhile, the patient was asked to reduce her aspirin to 81 mg a day, and the patient was also given a script of Plavix 75 mg a day to be started 7 days prior to the scheduled procedure. In the meantime, she was asked to call should she  have any concerns or questions. The patient leaves with good understanding and agreement with the above management plan. Electronically Signed   By: Luanne Bras M.D.   On: 01/22/2017 23:16    Labs:  CBC:  Recent Labs  04/10/16 1300 05/22/16 1403 02/07/17 1043 02/12/17 0644  WBC 4.8 4.4 4.0 3.7*  HGB 11.3* 12.4 11.9* 11.2*  HCT 36.6 39.4 37.6 35.8*  PLT 244 241 225 224    COAGS:  Recent Labs  02/12/17 0644  INR 1.09  APTT 33    BMP:  Recent Labs  04/10/16 1300  04/24/16 1048 05/22/16 1403 10/17/16 1140 02/07/17 1043 02/12/17 0644  NA 137  --   --  139 139 138 137  K 3.2*  --   --  3.2* 3.1* 2.9* 2.9*  CL 98*  --   --  96* 93* 95* 97*  CO2 31  --   --  34* 39* 38* 32  GLUCOSE 75  --   --  76 103* 104* 90  BUN 11  --  11 12 14 11 16   CALCIUM 8.7*  --   --  9.1 9.2 9.4 9.1  CREATININE 0.67  < > 0.91 0.85 0.95 0.94 0.85  GFRNONAA >60  --  72  --   --  >60 >60  GFRAA >60  --  83  --   --  >60 >60  < > = values in this interval not displayed.  LIVER FUNCTION TESTS:  Recent Labs  05/22/16 1403 02/12/17 0644  BILITOT 0.7 0.4  AST 24 40  ALT 20 28  ALKPHOS 88 72  PROT 7.3 7.3  ALBUMIN 4.2 3.9    TUMOR MARKERS: No results for input(s): AFPTM, CEA, CA199, CHROMGRNA in the last 8760 hours.  Assessment and Plan:  Hx Rt renal artery aneurysms endovascular repair 12/2015 Work up did reveal Rt PCOM asymptomatic aneurysm Now scheduled cerebral arteriogram with possible R PCOM artery aneurysm embolization Risks and Benefits discussed with the patient including, but not limited to bleeding, infection, vascular injury, contrast induced renal failure, stroke or even death. All of the patient's questions were answered, patient is agreeable to proceed. Consent signed and in chart.  Pt and family aware , if intervention is performed, she will be admitted overnight to Neuro ICU and planned for discharge in am. Agreeable to proceed  Thank you for this  interesting consult.  I greatly enjoyed meeting Allison Hart and look forward to participating in their care.  A copy of this report was sent to the requesting provider on this date.  Electronically Signed: Travor Royce A 02/12/2017, 7:38 AM   I spent a total of  30 Minutes   in face to face in clinical consultation, greater than 50% of which was counseling/coordinating care for cerebral arteriogram with poss R PCOM aneurysm embolization.

## 2017-02-12 NOTE — Sedation Documentation (Signed)
R groin level 0, Drsg CDI.

## 2017-02-12 NOTE — Discharge Instructions (Signed)
Femoral Site Care Refer to this sheet in the next few weeks. These instructions provide you with information about caring for yourself after your procedure. Your health care provider may also give you more specific instructions. Your treatment has been planned according to current medical practices, but problems sometimes occur. Call your health care provider if you have any problems or questions after your procedure. What can I expect after the procedure? After your procedure, it is typical to have the following:  Bruising at the site that usually fades within 1-2 weeks.  Blood collecting in the tissue (hematoma) that may be painful to the touch. It should usually decrease in size and tenderness within 1-2 weeks. Follow these instructions at home:  Take medicines only as directed by your health care provider.  You may shower 24-48 hours after the procedure or as directed by your health care provider. Remove the bandage (dressing) and gently wash the site with plain soap and water. Pat the area dry with a clean towel. Do not rub the site, because this may cause bleeding.  Do not take baths, swim, or use a hot tub until your health care provider approves.  Check your insertion site every day for redness, swelling, or drainage.  Do not apply powder or lotion to the site.  Limit use of stairs to twice a day for the first 2-3 days or as directed by your health care provider.  Do not squat for the first 2-3 days or as directed by your health care provider.  Do not lift over 10 lb (4.5 kg) for 5 days after your procedure or as directed by your health care provider.  Ask your health care provider when it is okay to:  Return to work or school.  Resume usual physical activities or sports.  Resume sexual activity.  Do not drive home if you are discharged the same day as the procedure. Have someone else drive you.  You may drive 24 hours after the procedure unless otherwise instructed by  your health care provider.  Do not operate machinery or power tools for 24 hours after the procedure or as directed by your health care provider.  If your procedure was done as an outpatient procedure, which means that you went home the same day as your procedure, a responsible adult should be with you for the first 24 hours after you arrive home.  Keep all follow-up visits as directed by your health care provider. This is important. Contact a health care provider if:  You have a fever.  You have chills.  You have increased bleeding from the site. Hold pressure on the site. Get help right away if:  You have unusual pain at the site.  You have redness, warmth, or swelling at the site.  You have drainage (other than a small amount of blood on the dressing) from the site.  The site is bleeding, and the bleeding does not stop after 30 minutes of holding steady pressure on the site.  Your leg or foot becomes pale, cool, tingly, or numb. This information is not intended to replace advice given to you by your health care provider. Make sure you discuss any questions you have with your health care provider. Document Released: 05/29/2014 Document Revised: 03/02/2016 Document Reviewed: 04/14/2014 Elsevier Interactive Patient Education  2017 [employer] Cerebral Angiogram, Care After Refer to this sheet in the next few weeks. These instructions provide you with information on caring for yourself after your procedure. Your health care  provider may also give you more specific instructions. Your treatment has been planned according to current medical practices, but problems sometimes occur. Call your health care provider if you have any problems or questions after your procedure. What can I expect after the procedure? After your procedure, it is typical to have the following:  Bruising at the catheter insertion site that usually fades within 1-2 weeks.  Blood collecting in the tissue  (hematoma) that may be painful to the touch. It should usually decrease in size and tenderness within 1-2 weeks.  A mild headache. Follow these instructions at home:  Take medicines only as directed by your health care provider.  You may shower 24-48 hours after the procedure or as directed by your health care provider. Remove the bandage (dressing) and gently wash the site with plain soap and water. Pat the area dry with a clean towel. Do not rub the site, because this may cause bleeding.  Do not take baths, swim, or use a hot tub until your health care provider approves.  Check your insertion site every day for redness, swelling, or drainage.  Do not apply powder or lotion to the site.  Do not lift over 10 lb (4.5 kg) for 5 days after your procedure or as directed by your health care provider.  Ask your health care provider when it is okay to:  Return to work or school.  Resume usual physical activities or sports.  Resume sexual activity.  Do not drive home if you are discharged the same day as the procedure. Have someone else drive you.  You may drive 24 hours after the procedure unless otherwise instructed by your health care provider.  Do not operate machinery or power tools for 24 hours after the procedure or as directed by your health care provider.  If your procedure was done as an outpatient procedure, which means that you went home the same day as your procedure, a responsible adult should be with you for the first 24 hours after you arrive home.  Keep all follow-up visits as directed by your health care provider. This is important. Contact a health care provider if:  You have a fever.  You have chills.  You have increased bleeding from the catheter insertion site. Hold pressure on the site. Get help right away if:  You have vision changes or loss of vision.  You have numbness or weakness on one side of your body.  You have difficulty talking, or you have  slurred speech or cannot speak (aphasia).  You feel confused or have difficulty remembering.  You have unusual pain at the catheter insertion site.  You have redness, warmth, or swelling at the catheter insertion site.  You have drainage (other than a small amount of blood on the dressing) from the catheter insertion site.  The catheter insertion site is bleeding, and the bleeding does not stop after 30 minutes of holding steady pressure on the site. These symptoms may represent a serious problem that is an emergency. Do not wait to see if the symptoms will go away. Get medical help right away. Call your local emergency services (911 in [place] Do not drive yourself to the hospital. This information is not intended to replace advice given to you by your health care provider. Make sure you discuss any questions you have with your health care provider. Document Released: 02/09/2014 Document Revised: 03/02/2016 Document Reviewed: 10/08/2013 Elsevier Interactive Patient Education  2017 [employer].

## 2017-02-12 NOTE — Sedation Documentation (Signed)
Gauze/tegaderm bandage applied to R femoral artery puncture site. Groin level 0, 4+RDP/2+RPT, drsg CDI.

## 2017-02-12 NOTE — Sedation Documentation (Signed)
5 Fr sheath removed from R femoral artery by [facility], RTR. Hemostasis achieved using Exoseal closure device. Groin level 0, 4+RDP, 2+RPT.

## 2017-02-13 ENCOUNTER — Encounter: Payer: Self-pay | Admitting: Family Medicine

## 2017-02-13 ENCOUNTER — Encounter (HOSPITAL_COMMUNITY): Payer: Self-pay | Admitting: Interventional Radiology

## 2017-02-13 LAB — I-STAT BETA HCG BLOOD, ED (NOT ORDERABLE): I-stat hCG, quantitative: <5 m[IU]/mL (ref <5)

## 2017-02-14 ENCOUNTER — Telehealth: Payer: Self-pay

## 2017-02-14 DIAGNOSIS — I10 Essential (primary) hypertension: Secondary | ICD-10-CM

## 2017-02-14 NOTE — Telephone Encounter (Signed)
Lab ordered per dr. Patient made aware by pcp

## 2017-02-14 NOTE — Telephone Encounter (Signed)
-----   Message from Fayrene Helper, MD sent at 02/13/2017  9:54 PM EDT ----- Regarding: pls order chem 7 not stat Pt to collect next week Tuesday am at 7:50 am , I will alos speak to her then briefly about a cardiology referral she needs. Dx is low potassium, hypokalemia and hypertension  ?? pls ask. I sent a my chart message to her so she knows

## 2017-02-15 ENCOUNTER — Other Ambulatory Visit: Payer: Self-pay | Admitting: Family Medicine

## 2017-02-20 ENCOUNTER — Other Ambulatory Visit: Payer: Self-pay | Admitting: Family Medicine

## 2017-02-20 DIAGNOSIS — Z87898 Personal history of other specified conditions: Secondary | ICD-10-CM

## 2017-02-20 DIAGNOSIS — R9389 Abnormal findings on diagnostic imaging of other specified body structures: Secondary | ICD-10-CM

## 2017-02-20 DIAGNOSIS — I270 Primary pulmonary hypertension: Secondary | ICD-10-CM

## 2017-02-20 DIAGNOSIS — I10 Essential (primary) hypertension: Secondary | ICD-10-CM

## 2017-02-20 LAB — BASIC METABOLIC PANEL
BUN: 15 mg/dL (ref 7–25)
CO2: 38 mmol/L — ABNORMAL HIGH (ref 20–31)
CREATININE: 0.92 mg/dL (ref 0.50–1.05)
Calcium: 9.4 mg/dL (ref 8.6–10.4)
Chloride: 96 mmol/L — ABNORMAL LOW (ref 98–110)
GLUCOSE: 79 mg/dL (ref 65–99)
POTASSIUM: 3.4 mmol/L — AB (ref 3.5–5.3)
Sodium: 140 mmol/L (ref 135–146)

## 2017-02-22 ENCOUNTER — Other Ambulatory Visit: Payer: Self-pay | Admitting: Family Medicine

## 2017-02-22 DIAGNOSIS — I270 Primary pulmonary hypertension: Secondary | ICD-10-CM

## 2017-02-25 NOTE — Progress Notes (Signed)
Cardiology Office Note   Date:  02/27/2017   ID:  Allison Hart, DOB Mar 01, 1961, MRN [id number]  PCP:  Fayrene Helper, MD    No chief complaint on file. Pulmonary hypertension   Wt Readings from Last 3 Encounters:  02/27/17 208 lb 12.8 oz (94.7 kg)  02/07/17 208 lb 4.8 oz (94.5 kg)  12/05/16 220 lb (99.8 kg)       History of Present Illness: Allison Hart is a 56 y.o. female  Who had a negative stress echo in 2013.  Allison Hart is a 56 y.o. female who is being seen today for the evaluation of Pulmonary hypertension at the request of Fayrene Helper, MD.   She was found to have enlarged lymph nodes.  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  THere was a question of brain aneurysm, but angio was negative.  Somehow, there was a question of pulmonary hypertension.   She reports some shortness of breath.  She does not exercise  [facility] feels it with exertion.  Going up a flight of stairs can make her SHOB.    Family h/o c/w stroke.  Uncle has a pacemaker.  Arrhythmia runs in the family.  She has a rare fluttering sensation.  No chest pain.     Past Medical History:  Diagnosis Date  . Anxiety   . Arthritis   . Essential hypertension, benign   . GERD (gastroesophageal reflux disease)   . Herpes   . Murmur   . Obesity   . Sinusitis   . Urinary frequency     Past Surgical History:  Procedure Laterality Date  . COLONOSCOPY  11/13/2012   Procedure: COLONOSCOPY;  Surgeon: Rogene Houston, MD;  [facility];  Service: Endoscopy;  Laterality: N/A;  930  . embolization of uterine artery X 2    . INGUINAL LYMPH NODE BIOPSY Right 04/14/2016   Procedure: RIGHT INGUINAL LYMPH NODE BIOPSY;  Surgeon: Aviva Signs, MD;  Location: [facility];  Service: General;  Laterality: Right;  . IR ANGIO INTRA EXTRACRAN SEL COM CAROTID INNOMINATE BILAT MOD SED  02/12/2017  . IR ANGIO VERTEBRAL SEL VERTEBRAL BILAT MOD SED  02/12/2017  . IR GENERIC  HISTORICAL  04/27/2016   IR RADIOLOGIST EVAL & MGMT 04/27/2016 Jacqulynn Cadet, MD [facility] INTERV RAD  . IR GENERIC HISTORICAL  12/05/2016   IR RADIOLOGIST EVAL & MGMT 12/05/2016 Jacqulynn Cadet, MD [facility] INTERV RAD  . IR RADIOLOGIST EVAL & MGMT  01/22/2017  . Left inguinal hernia repair and endometriosis  2007   No definitive records   . Left parotid bx benign  2005  . RADIOLOGY WITH ANESTHESIA N/A 01/18/2016   Procedure: RADIOLOGY WITH ANESTHESIA;  Surgeon: Luanne Bras, MD;  Location: [place];  Service: Radiology;  Laterality: N/A;  . RADIOLOGY WITH ANESTHESIA N/A 02/12/2017   Procedure: Cherie Dark EMBOLIZATION;  Surgeon: Luanne Bras, MD;  Location: [place];  Service: Radiology;  Laterality: N/A;  . renal artery aneurysm repair right Right 01/2016  . UMBILICAL HERNIA REPAIR  infancy      Current Outpatient Prescriptions  Medication Sig Dispense Refill  . acyclovir (ZOVIRAX) 800 MG tablet TAKE 1 TABLET BY MOUTH TWICE A DAY (Patient taking differently: TAKE 1 TABLET BY MOUTH TWICE A DAY AS NEEDED) 10 tablet 0  . amLODipine (NORVASC) 5 MG tablet TAKE 1 TABLET BY MOUTH EVERY DAY 90 tablet 1  . aspirin EC 81 MG tablet Take  81 mg by mouth daily.    Marland Kitchen azelastine (ASTELIN) 0.1 % nasal spray PLACE 2 SPRAYS INTO BOTH NOSTRILS 2 (TWO) TIMES DAILY. USE IN EACH NOSTRIL AS DIRECTED 90 mL 1  . cholecalciferol (VITAMIN D) 1000 units tablet Take 1,000 Units by mouth daily.    . fluticasone (FLONASE) 50 MCG/ACT nasal spray INSTILL 2 SPRAYS INTO BOTH NOSTRILS DAILY 48 g 1  . loratadine (CLARITIN) 10 MG tablet Take 1 tablet (10 mg total) by mouth daily. 90 tablet 3  . potassium chloride SA (K-DUR,KLOR-CON) 20 MEQ tablet Take 1 tablet (20 mEq total) by mouth 2 (two) times daily. 60 tablet 5  . Pyridoxine HCl (VITAMIN B-6 PO) Take 1 tablet by mouth daily.    Marland Kitchen triamterene-hydrochlorothiazide (MAXZIDE) 75-50 MG tablet TAKE 1 TABLET BY MOUTH EVERY DAY 90 tablet 1  . clopidogrel (PLAVIX) 75 MG tablet Take 1  tablet (75 mg total) by mouth daily. Start medication on February 05, 2017 (Patient not taking: Reported on 02/27/2017) 90 tablet 0   No current facility-administered medications for this visit.     Allergies:   Versed [midazolam]    Social History:  The patient  reports that she quit smoking about 21 years ago. Her smoking use included Cigarettes. She has a 1.25 pack-year smoking history. She has never used smokeless tobacco. She reports that she drinks alcohol. She reports that she does not use drugs.   Family History:  The patient's family history includes Coronary artery disease in her mother; Fibroids in her sister; Hyperlipidemia in her mother; Hypertension in her mother and sister; Irregular heart beat in her mother; Thyroid disease in her mother; Ulcerative colitis in her son.    ROS:  Please see the history of present illness.   Otherwise, review of systems are positive for DOE.   All other systems are reviewed and negative.    PHYSICAL EXAM: VS:  BP (!) 140/110 (BP Location: Left Arm, Patient Position: Sitting, Cuff Size: Normal)   Pulse 92   Ht 5\' 9"  (1.753 m)   Wt 208 lb 12.8 oz (94.7 kg)   SpO2 96%   BMI 30.83 kg/m  , BMI Body mass index is 30.83 kg/m. GEN: Well nourished, well developed, in no acute distress  HEENT: normal  Neck: no JVD, carotid bruits, or masses Cardiac: RRR; no murmurs, rubs, or gallops,no edema  Respiratory:  clear to auscultation bilaterally, normal work of breathing GI: soft, nontender, nondistended, + BS MS: no deformity or atrophy  Skin: warm and dry, no rash Neuro:  Strength and sensation are intact Psych: euthymic mood, full affect   EKG:   The ekg ordered today demonstrates NSR, LVH, NSST   Recent Labs: 05/22/2016: TSH 0.69 02/12/2017: ALT 28; Hemoglobin 11.2; Platelets 224 02/20/2017: BUN 15; Creat 0.92; Potassium 3.4; Sodium 140   Lipid Panel    Component Value Date/Time   CHOL 169 05/22/2016 1403   TRIG 75 05/22/2016 1403   HDL 56  05/22/2016 1403   CHOLHDL 3.0 05/22/2016 1403   VLDL 15 05/22/2016 1403   LDLCALC 98 05/22/2016 1403     Other studies Reviewed: Additional studies/ records that were reviewed today with results demonstrating: 2013 stress echo reviewed.   ASSESSMENT AND PLAN:  1. DOE: No signs of pulmonary HTN on exam or ECG.  Given DOE, will check echo.  This will be the best noninvasively check pulmonary artery pressures. She does not require right heart catheterization at this time. 2. Longterm she will need  regular exercise. 3. Following with IR regarding aneurysms. 4. HTN: COntinue current BP lowering meds.  Readings better controlled outside of MDs office.   Current medicines are reviewed at length with the patient today.  The patient concerns regarding her medicines were addressed.  The following changes have been made:  No change  Labs/ tests ordered today include:   Orders Placed This Encounter  Procedures  . EKG 12-Lead  . ECHOCARDIOGRAM COMPLETE    Recommend 150 minutes/week of aerobic exercise Low fat, low carb, high fiber diet recommended  Disposition:   FU for echo   Signed, Larae Grooms, MD  02/27/2017 12:45 PM    [facility] [place], [place], [place]  [address] Phone: [phone]; Fax: [phone]

## 2017-02-27 ENCOUNTER — Ambulatory Visit (INDEPENDENT_AMBULATORY_CARE_PROVIDER_SITE_OTHER): Payer: BLUE CROSS/BLUE SHIELD | Admitting: Interventional Cardiology

## 2017-02-27 ENCOUNTER — Encounter: Payer: Self-pay | Admitting: Interventional Cardiology

## 2017-02-27 ENCOUNTER — Telehealth: Payer: Self-pay | Admitting: Interventional Cardiology

## 2017-02-27 VITALS — BP 140/110 | HR 92 | Ht 69.0 in | Wt 208.8 lb

## 2017-02-27 DIAGNOSIS — I10 Essential (primary) hypertension: Secondary | ICD-10-CM | POA: Diagnosis not present

## 2017-02-27 DIAGNOSIS — R0602 Shortness of breath: Secondary | ICD-10-CM | POA: Diagnosis not present

## 2017-02-27 NOTE — Patient Instructions (Signed)
Medication Instructions:  Your physician recommends that you continue on your current medications as directed. Please refer to the Current Medication list given to you today.   Labwork: None ordered  Testing/Procedures: Your physician has requested that you have an echocardiogram. Echocardiography is a painless test that uses sound waves to create images of your heart. It provides your doctor with information about the size and shape of your heart and how well your heart's chambers and valves are working. This procedure takes approximately one hour. There are no restrictions for this procedure.    Follow-Up: As needed   Any Other Special Instructions Will Be Listed Below (If Applicable).  Echocardiogram An echocardiogram, or echocardiography, uses sound waves (ultrasound) to produce an image of your heart. The echocardiogram is simple, painless, obtained within a short period of time, and offers valuable information to your health care provider. The images from an echocardiogram can provide information such as:  Evidence of coronary artery disease (CAD).  Heart size.  Heart muscle function.  Heart valve function.  Aneurysm detection.  Evidence of a past heart attack.  Fluid buildup around the heart.  Heart muscle thickening.  Assess heart valve function. Tell a health care provider about:  Any allergies you have.  All medicines you are taking, including vitamins, herbs, eye drops, creams, and over-the-counter medicines.  Any problems you or family members have had with anesthetic medicines.  Any blood disorders you have.  Any surgeries you have had.  Any medical conditions you have.  Whether you are pregnant or may be pregnant. What happens before the procedure? No special preparation is needed. Eat and drink normally. What happens during the procedure?  In order to produce an image of your heart, gel will be applied to your chest and a wand-like tool  (transducer) will be moved over your chest. The gel will help transmit the sound waves from the transducer. The sound waves will harmlessly bounce off your heart to allow the heart images to be captured in real-time motion. These images will then be recorded.  You may need an IV to receive a medicine that improves the quality of the pictures. What happens after the procedure? You may return to your normal schedule including diet, activities, and medicines, unless your health care provider tells you otherwise. This information is not intended to replace advice given to you by your health care provider. Make sure you discuss any questions you have with your health care provider. Document Released: 09/22/2000 Document Revised: 05/13/2016 Document Reviewed: 06/02/2013 Elsevier Interactive Patient Education  2017 [employer].    If you need a refill on your cardiac medications before your next appointment, please call your pharmacy.

## 2017-02-27 NOTE — Telephone Encounter (Signed)
Scheduled echo in [facility] March 28, 2017

## 2017-03-08 ENCOUNTER — Other Ambulatory Visit: Payer: Self-pay | Admitting: Family Medicine

## 2017-03-09 NOTE — Addendum Note (Signed)
Addendum  created 03/09/17 1145 by Effie Berkshire, MD   Sign clinical note

## 2017-03-13 ENCOUNTER — Ambulatory Visit: Payer: BLUE CROSS/BLUE SHIELD | Admitting: Cardiology

## 2017-03-15 ENCOUNTER — Other Ambulatory Visit: Payer: Self-pay

## 2017-03-28 ENCOUNTER — Ambulatory Visit (INDEPENDENT_AMBULATORY_CARE_PROVIDER_SITE_OTHER): Payer: BLUE CROSS/BLUE SHIELD

## 2017-03-28 ENCOUNTER — Other Ambulatory Visit: Payer: Self-pay

## 2017-03-28 DIAGNOSIS — R0602 Shortness of breath: Secondary | ICD-10-CM | POA: Diagnosis not present

## 2017-04-04 ENCOUNTER — Other Ambulatory Visit: Payer: Self-pay

## 2017-04-04 DIAGNOSIS — I10 Essential (primary) hypertension: Secondary | ICD-10-CM

## 2017-04-04 MED ORDER — FUROSEMIDE 40 MG PO TABS: 40.0000 mg | ORAL_TABLET | Freq: Every day | ORAL | 3 refills | Status: DC

## 2017-04-04 MED ORDER — POTASSIUM CHLORIDE CRYS ER 20 MEQ PO TBCR: 20.0000 meq | EXTENDED_RELEASE_TABLET | Freq: Every day | ORAL | 5 refills | Status: DC

## 2017-04-15 NOTE — Progress Notes (Signed)
Cardiology Office Note   Date:  04/16/2017   ID:  Allison Hart, DOB 05-Feb-1961, MRN [id number]  PCP:  Fayrene Helper, MD    No chief complaint on file. f/u pulmonary HTN   Wt Readings from Last 3 Encounters:  04/16/17 209 lb (94.8 kg)  02/27/17 208 lb 12.8 oz (94.7 kg)  02/07/17 208 lb 4.8 oz (94.5 kg)       History of Present Illness: Allison Hart is a 56 y.o. female  Who had a negative stress echo in 2013.   She was found to have enlarged lymph nodes.  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  THere was a question of brain aneurysm, but angio was negative.  Somehow, there was a question of pulmonary hypertension.   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  We changed diuretic to Lasix to see if this would help with increased PA pressrure seen on echo.   She never started the Lasix due to a pharmacy.  She just started walking recently for exercise.    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        Past Medical History:  Diagnosis Date  . Anxiety   . Arthritis   . Essential hypertension, benign   . GERD (gastroesophageal reflux disease)   . Herpes   . Murmur   . Obesity   . Sinusitis   . Urinary frequency     Past Surgical History:  Procedure Laterality Date  . COLONOSCOPY  11/13/2012   Procedure: COLONOSCOPY;  Surgeon: Rogene Houston, MD;  [facility];  Service: Endoscopy;  Laterality: N/A;  930  . embolization of uterine artery X 2    . INGUINAL LYMPH NODE BIOPSY Right 04/14/2016   Procedure: RIGHT INGUINAL LYMPH NODE BIOPSY;  Surgeon: Aviva Signs, MD;  [facility];  Service: General;  Laterality: Right;  . IR ANGIO INTRA EXTRACRAN SEL COM CAROTID INNOMINATE BILAT MOD SED  02/12/2017  . IR ANGIO VERTEBRAL SEL VERTEBRAL BILAT MOD SED  02/12/2017  . IR GENERIC HISTORICAL  04/27/2016   IR RADIOLOGIST EVAL & MGMT 04/27/2016 Jacqulynn Cadet, MD [facility] INTERV RAD  . IR GENERIC HISTORICAL   12/05/2016   IR RADIOLOGIST EVAL & MGMT 12/05/2016 Jacqulynn Cadet, MD [facility] INTERV RAD  . IR RADIOLOGIST EVAL & MGMT  01/22/2017  . Left inguinal hernia repair and endometriosis  2007   No definitive records   . Left parotid bx benign  2005  . RADIOLOGY WITH ANESTHESIA N/A 01/18/2016   Procedure: RADIOLOGY WITH ANESTHESIA;  Surgeon: Luanne Bras, MD;  Location: [place];  Service: Radiology;  Laterality: N/A;  . RADIOLOGY WITH ANESTHESIA N/A 02/12/2017   Procedure: Cherie Dark EMBOLIZATION;  Surgeon: Luanne Bras, MD;  Location: [place];  Service: Radiology;  Laterality: N/A;  . renal artery aneurysm repair right Right 01/2016  . UMBILICAL HERNIA REPAIR  infancy      Current Outpatient Prescriptions  Medication Sig Dispense Refill  . acyclovir (ZOVIRAX) 800 MG tablet TAKE 1 TABLET BY MOUTH TWICE A DAY (Patient taking differently: TAKE 1 TABLET BY MOUTH TWICE A DAY AS NEEDED) 10 tablet 0  . amLODipine (NORVASC) 5 MG tablet TAKE 1 TABLET BY MOUTH EVERY DAY 90 tablet 1  . aspirin EC 81 MG tablet Take 81 mg by mouth daily.    Marland Kitchen azelastine (ASTELIN) 0.1 % nasal spray PLACE 2 SPRAYS INTO BOTH NOSTRILS  2 (TWO) TIMES DAILY. USE IN EACH NOSTRIL AS DIRECTED 90 mL 1  . cholecalciferol (VITAMIN D) 1000 units tablet Take 1,000 Units by mouth daily.    . fluticasone (FLONASE) 50 MCG/ACT nasal spray INSTILL 2 SPRAYS INTO BOTH NOSTRILS DAILY 48 g 1  . furosemide (LASIX) 40 MG tablet Take 1 tablet (40 mg total) by mouth daily. 90 tablet 3  . loratadine (CLARITIN) 10 MG tablet Take 1 tablet (10 mg total) by mouth daily. 90 tablet 3  . potassium chloride SA (K-DUR,KLOR-CON) 20 MEQ tablet Take 1 tablet (20 mEq total) by mouth daily. 60 tablet 5  . Pyridoxine HCl (VITAMIN B-6 PO) Take 1 tablet by mouth daily.     No current facility-administered medications for this visit.     Allergies:   Versed [midazolam]    Social History:  The patient  reports that she quit smoking about 21 years ago. Her smoking  use included Cigarettes. She has a 1.25 pack-year smoking history. She has never used smokeless tobacco. She reports that she drinks alcohol. She reports that she does not use drugs.   Family History:  The patient's family history includes Coronary artery disease in her mother; Fibroids in her sister; Hyperlipidemia in her mother; Hypertension in her mother and sister; Irregular heart beat in her mother; Thyroid disease in her mother; Ulcerative colitis in her son.    ROS:  Please see the history of present illness.   Otherwise, review of systems are positive for fatigue.   All other systems are reviewed and negative.    PHYSICAL EXAM: VS:  BP (!) 132/100   Pulse 83   Ht 5\' 9"  (1.753 m)   Wt 209 lb (94.8 kg)   SpO2 98%   BMI 30.86 kg/m  , BMI Body mass index is 30.86 kg/m. GEN: Well nourished, well developed, in no acute distress  HEENT: normal  Neck: no JVD, carotid bruits, or masses Cardiac: RRR; no murmurs, rubs, or gallops,no edema  Respiratory:  clear to auscultation bilaterally, normal work of breathing GI: soft, nontender, nondistended, + BS MS: no deformity or atrophy  Skin: warm and dry, no rash Neuro:  Strength and sensation are intact Psych: euthymic mood, full affect    Recent Labs: 05/22/2016: TSH 0.69 02/12/2017: ALT 28; Hemoglobin 11.2; Platelets 224 02/20/2017: BUN 15; Creat 0.92; Potassium 3.4; Sodium 140   Lipid Panel    Component Value Date/Time   CHOL 169 05/22/2016 1403   TRIG 75 05/22/2016 1403   HDL 56 05/22/2016 1403   CHOLHDL 3.0 05/22/2016 1403   VLDL 15 05/22/2016 1403   LDLCALC 98 05/22/2016 1403     Other studies Reviewed: Additional studies/ records that were reviewed today with results demonstrating: echo showed increased PA pressure in the setting of dilated IVC with increased central venous pressure.   ASSESSMENT AND PLAN:  1. Pulmonary HTN: May be related to volume overload based on the size or IVC at the time of echo. Make the change  to Lasix 40 mg daily from [facility].  Check labs week after she changes diuretic. 2. HTN: Increased readings at times. May improve with low salt diet and diuresis as well. 3. DOE: Hopefully, will improve with diuresis. 4. Prior aneurysms: Renal stents placed.   Current medicines are reviewed at length with the patient today.  The patient concerns regarding her medicines were addressed.  The following changes have been made:  No change  Labs/ tests ordered today include:  No orders of  the defined types were placed in this encounter.   Recommend 150 minutes/week of aerobic exercise Low fat, low carb, high fiber diet recommended  Disposition:   FU in 6-8 weeks   Signed, Larae Grooms, MD  04/16/2017 12:05 PM    [facility] [place], [place], [place]  [address] Phone: [phone]; Fax: [phone]

## 2017-04-16 ENCOUNTER — Ambulatory Visit (INDEPENDENT_AMBULATORY_CARE_PROVIDER_SITE_OTHER): Payer: Self-pay | Admitting: Interventional Cardiology

## 2017-04-16 ENCOUNTER — Encounter: Payer: Self-pay | Admitting: Interventional Cardiology

## 2017-04-16 VITALS — BP 132/100 | HR 83 | Ht 69.0 in | Wt 209.0 lb

## 2017-04-16 DIAGNOSIS — I272 Pulmonary hypertension, unspecified: Secondary | ICD-10-CM | POA: Insufficient documentation

## 2017-04-16 DIAGNOSIS — I10 Essential (primary) hypertension: Secondary | ICD-10-CM

## 2017-04-16 MED ORDER — POTASSIUM CHLORIDE CRYS ER 20 MEQ PO TBCR: 20.0000 meq | EXTENDED_RELEASE_TABLET | Freq: Two times a day (BID) | ORAL | 3 refills | Status: DC

## 2017-04-16 NOTE — Addendum Note (Signed)
Addended by: Drue Novel I on: 04/16/2017 06:07 PM   Modules accepted: Orders

## 2017-04-16 NOTE — Patient Instructions (Addendum)
Medication Instructions:  START taking the furosemide (lasix) 40 mg daily. CONTINUE Postassium 20 MEQ twice daily.  Labwork: Your physician recommends that you return for lab work in: 1 week for BMET   Testing/Procedures: None ordered  Follow-Up: Your physician wants you to follow-up in: 6-8 weeks with Dr. Irish Lack.   Any Other Special Instructions Will Be Listed Below (If Applicable).   DASH Eating Plan DASH stands for "Dietary Approaches to Stop Hypertension." The DASH eating plan is a healthy eating plan that has been shown to reduce high blood pressure (hypertension). It may also reduce your risk for type 2 diabetes, heart disease, and stroke. The DASH eating plan may also help with weight loss. What are tips for following this plan? General guidelines  Avoid eating more than 2,300 mg (milligrams) of salt (sodium) a day. If you have hypertension, you may need to reduce your sodium intake to 1,500 mg a day.  Limit alcohol intake to no more than 1 drink a day for nonpregnant women and 2 drinks a day for men. One drink equals 12 oz of beer, 5 oz of wine, or 1 oz of hard liquor.  Work with your health care provider to maintain a healthy body weight or to lose weight. Ask what an ideal weight is for you.  Get at least 30 minutes of exercise that causes your heart to beat faster (aerobic exercise) most days of the week. Activities may include walking, swimming, or biking.  Work with your health care provider or diet and nutrition specialist (dietitian) to adjust your eating plan to your individual calorie needs. Reading food labels  Check food labels for the amount of sodium per serving. Choose foods with less than 5 percent of the Daily Value of sodium. Generally, foods with less than 300 mg of sodium per serving fit into this eating plan.  To find whole grains, look for the word "whole" as the first word in the ingredient list. Shopping  Buy products labeled as "low-sodium" or  "no salt added."  Buy fresh foods. Avoid canned foods and premade or frozen meals. Cooking  Avoid adding salt when cooking. Use salt-free seasonings or herbs instead of table salt or sea salt. Check with your health care provider or pharmacist before using salt substitutes.  Do not fry foods. Cook foods using healthy methods such as baking, boiling, grilling, and broiling instead.  Cook with heart-healthy oils, such as olive, canola, soybean, or sunflower oil. Meal planning   Eat a balanced diet that includes: ? 5 or more servings of fruits and vegetables each day. At each meal, try to fill half of your plate with fruits and vegetables. ? Up to 6-8 servings of whole grains each day. ? Less than 6 oz of lean meat, poultry, or fish each day. A 3-oz serving of meat is about the same size as a deck of cards. One egg equals 1 oz. ? 2 servings of low-fat dairy each day. ? A serving of nuts, seeds, or beans 5 times each week. ? Heart-healthy fats. Healthy fats called Omega-3 fatty acids are found in foods such as flaxseeds and coldwater fish, like sardines, salmon, and mackerel.  Limit how much you eat of the following: ? Canned or prepackaged foods. ? Food that is high in trans fat, such as fried foods. ? Food that is high in saturated fat, such as fatty meat. ? Sweets, desserts, sugary drinks, and other foods with added sugar. ? Full-fat dairy products.  Do not  salt foods before eating.  Try to eat at least 2 vegetarian meals each week.  Eat more home-cooked food and less restaurant, buffet, and fast food.  When eating at a restaurant, ask that your food be prepared with less salt or no salt, if possible. What foods are recommended? The items listed may not be a complete list. Talk with your dietitian about what dietary choices are best for you. Grains Whole-grain or whole-wheat bread. Whole-grain or whole-wheat pasta. Brown rice. [place]row. Bulgur. Whole-grain and low-sodium  cereals. Pita bread. Low-fat, low-sodium crackers. Whole-wheat flour tortillas. Vegetables Fresh or frozen vegetables (raw, steamed, roasted, or grilled). Low-sodium or reduced-sodium tomato and vegetable juice. Low-sodium or reduced-sodium tomato sauce and tomato paste. Low-sodium or reduced-sodium canned vegetables. Fruits All fresh, dried, or frozen fruit. Canned fruit in natural juice (without added sugar). Meat and other protein foods Skinless chicken or [place]. Ground chicken or [place]. Pork with fat trimmed off. Fish and seafood. Egg whites. Dried beans, peas, or lentils. Unsalted nuts, nut butters, and seeds. Unsalted canned beans. Lean cuts of beef with fat trimmed off. Low-sodium, lean deli meat. Dairy Low-fat (1%) or fat-free (skim) milk. Fat-free, low-fat, or reduced-fat cheeses. Nonfat, low-sodium ricotta or cottage cheese. Low-fat or nonfat yogurt. Low-fat, low-sodium cheese. Fats and oils Soft margarine without trans fats. Vegetable oil. Low-fat, reduced-fat, or light mayonnaise and salad dressings (reduced-sodium). Canola, safflower, olive, soybean, and sunflower oils. Avocado. Seasoning and other foods Herbs. Spices. Seasoning mixes without salt. Unsalted popcorn and pretzels. Fat-free sweets. What foods are not recommended? The items listed may not be a complete list. Talk with your dietitian about what dietary choices are best for you. Grains Baked goods made with fat, such as croissants, muffins, or some breads. Dry pasta or rice meal packs. Vegetables Creamed or fried vegetables. Vegetables in a cheese sauce. Regular canned vegetables (not low-sodium or reduced-sodium). Regular canned tomato sauce and paste (not low-sodium or reduced-sodium). Regular tomato and vegetable juice (not low-sodium or reduced-sodium). Angie Fava. Olives. Fruits Canned fruit in a light or heavy syrup. Fried fruit. Fruit in cream or butter sauce. Meat and other protein foods Fatty cuts of meat. Ribs.  Fried meat. Berniece Salines. Sausage. Bologna and other processed lunch meats. Salami. Fatback. Hotdogs. Bratwurst. Salted nuts and seeds. Canned beans with added salt. Canned or smoked fish. Whole eggs or egg yolks. Chicken or [place] with skin. Dairy Whole or 2% milk, cream, and half-and-half. Whole or full-fat cream cheese. Whole-fat or sweetened yogurt. Full-fat cheese. Nondairy creamers. Whipped toppings. Processed cheese and cheese spreads. Fats and oils Butter. Stick margarine. Lard. Shortening. Ghee. Bacon fat. Tropical oils, such as coconut, palm kernel, or palm oil. Seasoning and other foods Salted popcorn and pretzels. Onion salt, garlic salt, seasoned salt, table salt, and sea salt. Worcestershire sauce. Tartar sauce. Barbecue sauce. Teriyaki sauce. Soy sauce, including reduced-sodium. Steak sauce. Canned and packaged gravies. Fish sauce. Oyster sauce. Cocktail sauce. Horseradish that you find on the shelf. Ketchup. Mustard. Meat flavorings and tenderizers. Bouillon cubes. Hot sauce and Tabasco sauce. Premade or packaged marinades. Premade or packaged taco seasonings. Relishes. Regular salad dressings. Where to find more information:  National Heart, Lung, and [place]: https://wilson-eaton.com/  American Heart Association: www.heart.org Summary  The DASH eating plan is a healthy eating plan that has been shown to reduce high blood pressure (hypertension). It may also reduce your risk for type 2 diabetes, heart disease, and stroke.  With the DASH eating plan, you should limit salt (sodium) intake to  2,300 mg a day. If you have hypertension, you may need to reduce your sodium intake to 1,500 mg a day.  When on the DASH eating plan, aim to eat more fresh fruits and vegetables, whole grains, lean proteins, low-fat dairy, and heart-healthy fats.  Work with your health care provider or diet and nutrition specialist (dietitian) to adjust your eating plan to your individual calorie needs. This  information is not intended to replace advice given to you by your health care provider. Make sure you discuss any questions you have with your health care provider. Document Released: 09/14/2011 Document Revised: 09/18/2016 Document Reviewed: 09/18/2016 Elsevier Interactive Patient Education  2017 [employer].    If you need a refill on your cardiac medications before your next appointment, please call your pharmacy.

## 2017-04-23 ENCOUNTER — Telehealth: Payer: Self-pay | Admitting: Interventional Cardiology

## 2017-04-23 ENCOUNTER — Other Ambulatory Visit: Payer: Self-pay

## 2017-04-23 NOTE — Telephone Encounter (Signed)
Faxed over lab requisition for BMET to [phone]. Confirmation received that fax went through.

## 2017-04-23 NOTE — Telephone Encounter (Signed)
Left message to call back  

## 2017-04-23 NOTE — Telephone Encounter (Signed)
New Message  [facility] at [facility] called to get the lab orders for pt to have lab work in there office. Pt is currently at the office. Please call back to discuss if needed

## 2017-05-10 ENCOUNTER — Other Ambulatory Visit: Payer: Self-pay | Admitting: Family Medicine

## 2017-05-27 NOTE — Progress Notes (Signed)
Cardiology Office Note   Date:  05/28/2017   ID:  Allison Hart, DOB 03-07-1961, MRN [id number]  PCP:  Fayrene Helper, MD    No chief complaint on file.  DOE  [facility] Readings from Last 3 Encounters:  05/28/17 197 lb 6.4 oz (89.5 kg)  04/16/17 209 lb (94.8 kg)  02/27/17 208 lb 12.8 oz (94.7 kg)       History of Present Illness: Allison Hart is a 56 y.o. female  Who had a negative stress echo in 2013.   She was found to have enlarged lymph nodes. 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  THere was a question of brain aneurysm, but angio was negative.  Somehow, there was a question of pulmonary hypertension.   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  We changed diuretic to Lasix to see if this would help with increased PA pressrure seen on echo.   There was a delay in starting the Lasix.  Since changing diuretic, she has lost 12 lbs.  She has been exercising and trying to eat healthy.  She does chest sometimes on her diet.  Her walking has slowed down as well.  When she was walking, she notes that her stamina was increasing.  Denies : Chest pain. Dizziness. Leg edema. Nitroglycerin use. Orthopnea. Palpitations. Paroxysmal nocturnal dyspnea. Shortness of breath. Syncope.      Past Medical History:  Diagnosis Date  . Anxiety   . Arthritis   . Essential hypertension, benign   . GERD (gastroesophageal reflux disease)   . Herpes   . Murmur   . Obesity   . Sinusitis   . Urinary frequency     Past Surgical History:  Procedure Laterality Date  . COLONOSCOPY  11/13/2012   Procedure: COLONOSCOPY;  Surgeon: Rogene Houston, MD;  [facility]: [facility];  Service: Endoscopy;  Laterality: N/A;  930  . embolization of uterine artery X 2    . INGUINAL LYMPH NODE BIOPSY Right 04/14/2016   Procedure: RIGHT INGUINAL LYMPH NODE BIOPSY;  Surgeon: Aviva Signs, MD;  Location: [facility];  Service: General;  Laterality: Right;  . IR ANGIO INTRA EXTRACRAN  SEL COM CAROTID INNOMINATE BILAT MOD SED  02/12/2017  . IR ANGIO VERTEBRAL SEL VERTEBRAL BILAT MOD SED  02/12/2017  . IR GENERIC HISTORICAL  04/27/2016   IR RADIOLOGIST EVAL & MGMT 04/27/2016 Jacqulynn Cadet, MD [facility] INTER[facility]  . IR GENERIC HISTORICAL  12/05/2016   IR RADIOLOGIST EVAL & MGMT 12/05/2016 Jacqulynn Cadet, MD [facility] INTER[facility] RAD  . IR RADIOLOGIST EVAL & MGMT  01/22/2017  . Left inguinal hernia repair and endometriosis  2007   No definitive records   . Left parotid bx benign  2005  . RADIOLOGY WITH ANESTHESIA N/A 01/18/2016   Procedure: RADIOLOGY WITH ANESTHESIA;  Surgeon: Luanne Bras, MD;  Location: [facility];  Service: Radiology;  Laterality: N/A;  . RADIOLOGY WITH ANESTHESIA N/A 02/12/2017   Procedure: Cherie Dark EMBOLIZATION;  Surgeon: Luanne Bras, MD;  Location: [facility];  Service: Radiology;  Laterality: N/A;  . renal artery aneurysm repair right Right 01/2016  . UMBILICAL HERNIA REPAIR  infancy      Current Outpatient Prescriptions  Medication Sig Dispense Refill  . acyclovir (ZOVIRAX) 800 MG tablet Take 800 mg by mouth at bedtime as needed.    Marland Kitchen amLODipine (NORVASC) 5 MG tablet TAKE 1 TABLET BY MOUTH EVERY DAY 90 tablet 1  . aspirin EC 81 MG  tablet Take 81 mg by mouth daily.    Marland Kitchen azelastine (ASTELIN) 0.1 % nasal spray PLACE 2 SPRAYS INTO BOTH NOSTRILS 2 (TWO) TIMES DAILY. USE IN EACH NOSTRIL AS DIRECTED 90 mL 1  . cholecalciferol (VITAMIN D) 1000 units tablet Take 1,000 Units by mouth daily.    . fluticasone (FLONASE) 50 MCG/ACT nasal spray INSTILL 2 SPRAYS INTO BOTH NOSTRILS DAILY 48 g 1  . furosemide (LASIX) 40 MG tablet Take 1 tablet (40 mg total) by mouth daily. 90 tablet 3  . loratadine (CLARITIN) 10 MG tablet Take 1 tablet (10 mg total) by mouth daily. 90 tablet 3  . potassium chloride SA (K-DUR,KLOR-CON) 20 MEQ tablet Take 1 tablet (20 mEq total) by mouth 2 (two) times daily. 180 tablet 3  . Pyridoxine HCl (VITAMIN B-6 PO) Take 1 tablet by mouth daily.     No  current facility-administered medications for this visit.     Allergies:   Versed [midazolam]    Social History:  The patient  reports that she quit smoking about 21 years ago. Her smoking use included Cigarettes. She has a 1.25 pack-year smoking history. She has never used smokeless tobacco. She reports that she drinks alcohol. She reports that she does not use drugs.   Family History:  The patient's family history includes Coronary artery disease in her mother; Fibroids in her sister; Hyperlipidemia in her mother; Hypertension in her mother and sister; Irregular heart beat in her mother; Thyroid disease in her mother; Ulcerative colitis in her son.    ROS:  Please see the history of present illness.   Otherwise, review of systems are positive for weight loss.   All other systems are reviewed and negative.    PHYSICAL EXAM: VS:  BP (!) 148/82   Pulse 65   Ht 5\' 9"  (1.753 m)   Wt 197 lb 6.4 oz (89.5 kg)   SpO2 96%   BMI 29.15 kg/m  , BMI Body mass index is 29.15 kg/m. GEN: Well nourished, well developed, in no acute distress  HEENT: normal  Neck: no JVD, carotid bruits, or masses Cardiac: RRR; premature beats, no murmurs, rubs, or gallops,no edema  Respiratory:  clear to auscultation bilaterally, normal work of breathing GI: soft, nontender, nondistended, + BS MS: no deformity or atrophy  Skin: warm and dry, no rash Neuro:  Strength and sensation are intact Psych: euthymic mood, full affect   EKG:   The ekg ordered today demonstrates NSR, blocked PACs, NSST   Recent Labs: 02/12/2017: ALT 28; Hemoglobin 11.2; Platelets 224 02/20/2017: BUN 15; Creat 0.92; Potassium 3.4; Sodium 140   Lipid Panel    Component Value Date/Time   CHOL 169 05/22/2016 1403   TRIG 75 05/22/2016 1403   HDL 56 05/22/2016 1403   CHOLHDL 3.0 05/22/2016 1403   VLDL 15 05/22/2016 1403   LDLCALC 98 05/22/2016 1403     Other studies Reviewed: Additional studies/ records that were reviewed today with  results demonstrating: 2018 echo result reviewed.   ASSESSMENT AND PLAN:  1. Pulmonary HTN: Was noted by echo.  SHe has diuresed well and I susect that her CVP and thus PA pressure would have decreased.  Needs potassium recheck due to prior hypokalemia.  She has labs scheduled with her PMD.  WOuld not repeat echo unless thereis a change in symptoms.  2. HTN: Borderline controlled today. Continue current medications.  Better readings at other times. Could increase amlodipine if readings stay high. 3. DOE: Improved. Continue current  diuretic therapy. She will have blood work checked with her primary care doctor in the near future. 4. Prior aneurysm: renal stents placed in the past.    Current medicines are reviewed at length with the patient today.  The patient concerns regarding her medicines were addressed.  The following changes have been made:  No change  Labs/ tests ordered today include:  No orders of the defined types were placed in this encounter.   Recommend 150 minutes/week of aerobic exercise Low fat, low carb, high fiber diet recommended  Disposition:   FU in 1 year   Signed, Larae Grooms, MD  05/28/2017 11:21 AM    [facility] [address], [place], [place]  [address] Phone: [phone]; Fax: [phone]

## 2017-05-28 ENCOUNTER — Encounter: Payer: Self-pay | Admitting: Interventional Cardiology

## 2017-05-28 ENCOUNTER — Ambulatory Visit (INDEPENDENT_AMBULATORY_CARE_PROVIDER_SITE_OTHER): Payer: Self-pay | Admitting: Interventional Cardiology

## 2017-05-28 VITALS — BP 148/82 | HR 65 | Ht 69.0 in | Wt 197.4 lb

## 2017-05-28 DIAGNOSIS — R0609 Other forms of dyspnea: Secondary | ICD-10-CM

## 2017-05-28 DIAGNOSIS — I272 Pulmonary hypertension, unspecified: Secondary | ICD-10-CM

## 2017-05-28 DIAGNOSIS — I722 Aneurysm of renal artery: Secondary | ICD-10-CM

## 2017-05-28 DIAGNOSIS — I10 Essential (primary) hypertension: Secondary | ICD-10-CM

## 2017-05-28 NOTE — Patient Instructions (Signed)

## 2017-06-04 ENCOUNTER — Encounter: Payer: Self-pay | Admitting: Family Medicine

## 2017-06-27 ENCOUNTER — Encounter: Payer: BLUE CROSS/BLUE SHIELD | Admitting: Family Medicine

## 2017-07-25 IMAGING — CT CT ABD-PELV W/ CM
2 of 5 series · 12 of 36 positions shown, 15 images · IV contrast (omnipaque)
Comparison: None.

CLINICAL DATA: Axillary lymphadenopathy.

EXAM:
CT CHEST, ABDOMEN, AND PELVIS WITH CONTRAST
TECHNIQUE: Multidetector CT imaging of the chest, abdomen and pelvis was
performed following the standard protocol during bolus
administration of intravenous contrast.
CONTRAST:  100mL OMNIPAQUE IOHEXOL 300 MG/ML  SOLN

[Series 2: cap with 5.0 b40f · axial · 0.70mm/px · z∈[-658,-78]mm · 9 of 134 slices shown, 12 images]
[im 9/134  mediastinal]
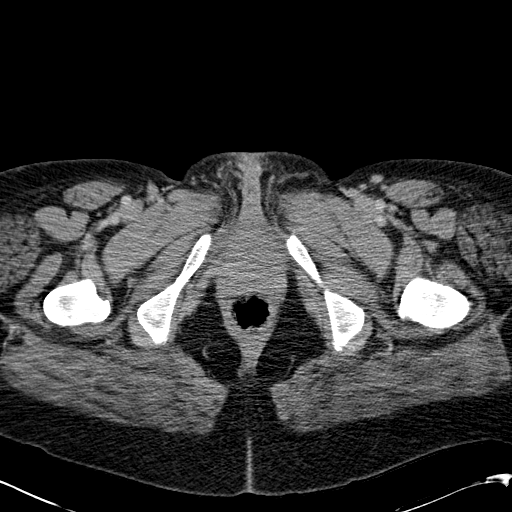
[im 9/134  lung]
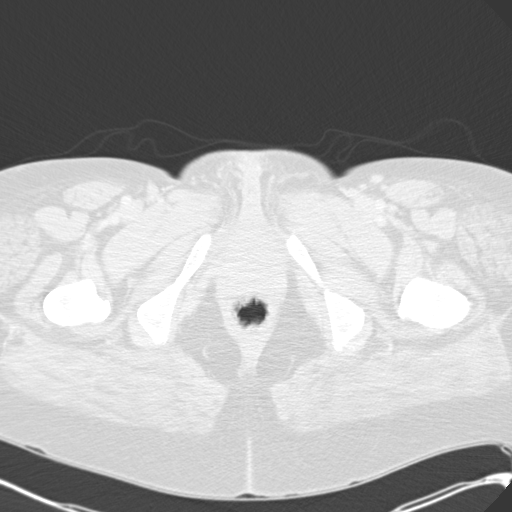
[im 27/134  lung]
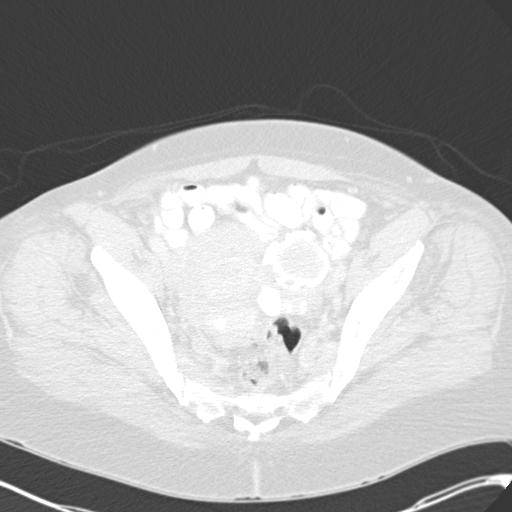
[im 36/134  lung]
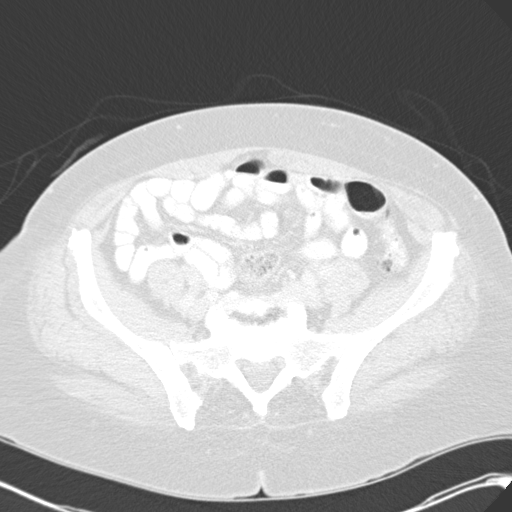
[im 54/134  lung]
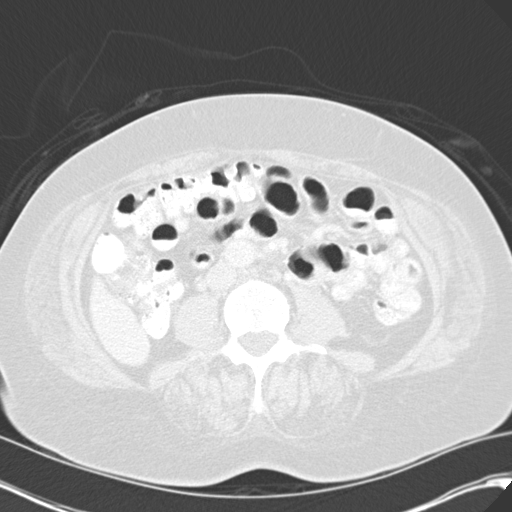
[im 71/134  mediastinal]
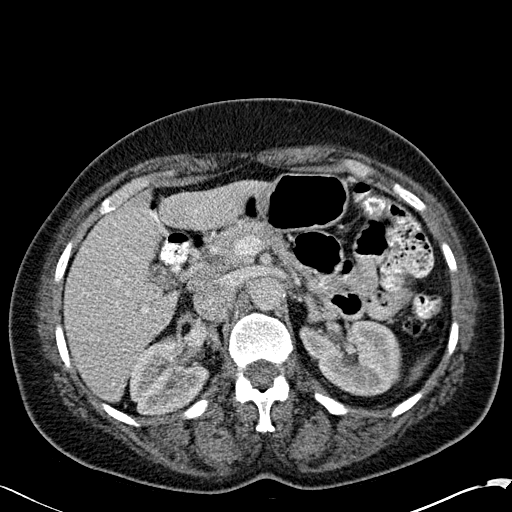
[im 71/134  lung]
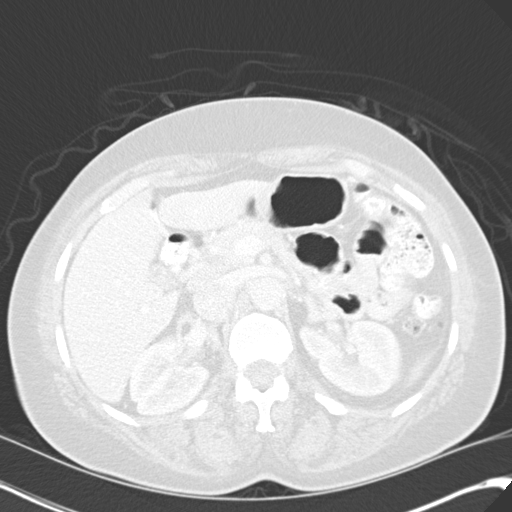
[im 80/134  lung]
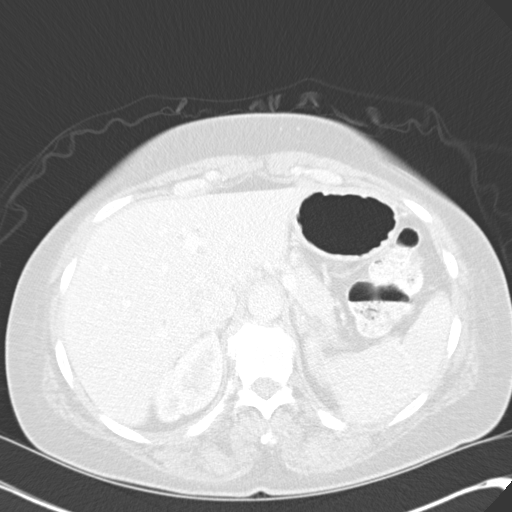
[im 98/134  lung]
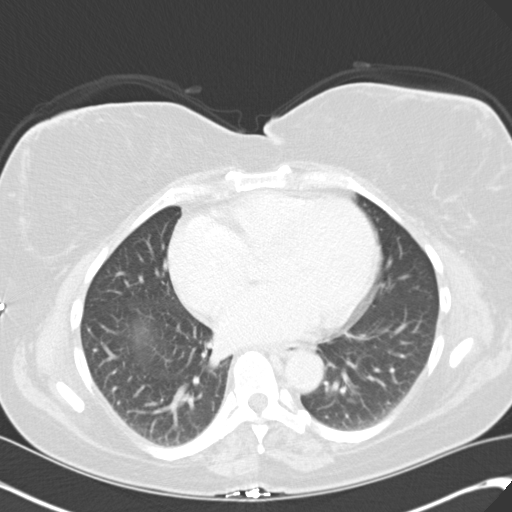
[im 107/134  lung]
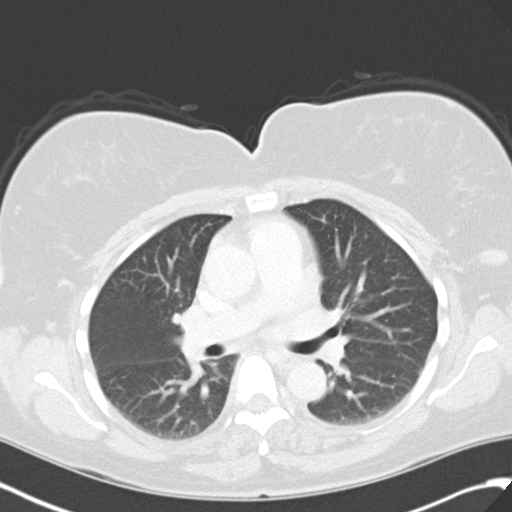
[im 125/134  mediastinal]
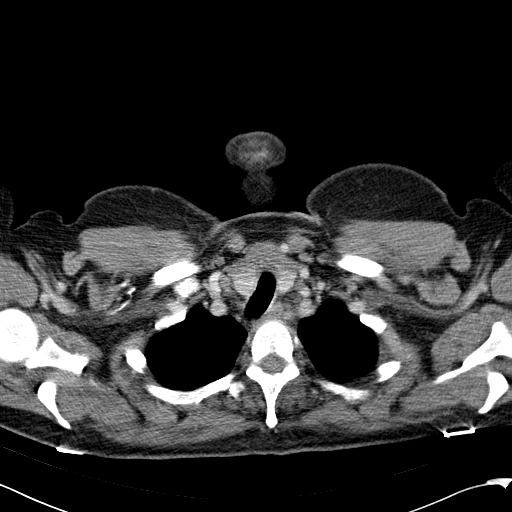
[im 125/134  lung]
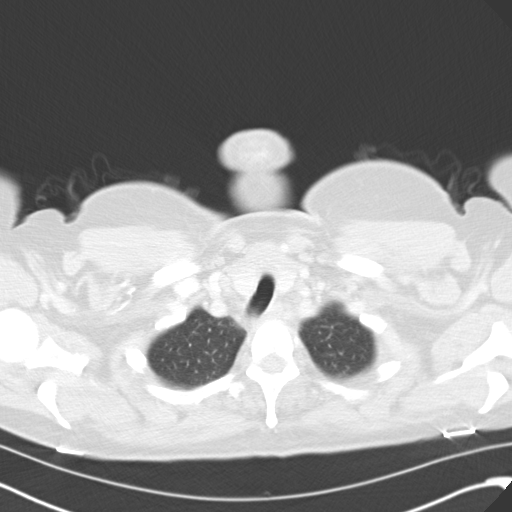

[Series 4: mpr cor post contrast 3.0mm · coronal · 0.72mm/px · 3 of 101 slices shown]
[im 21/101  lung]
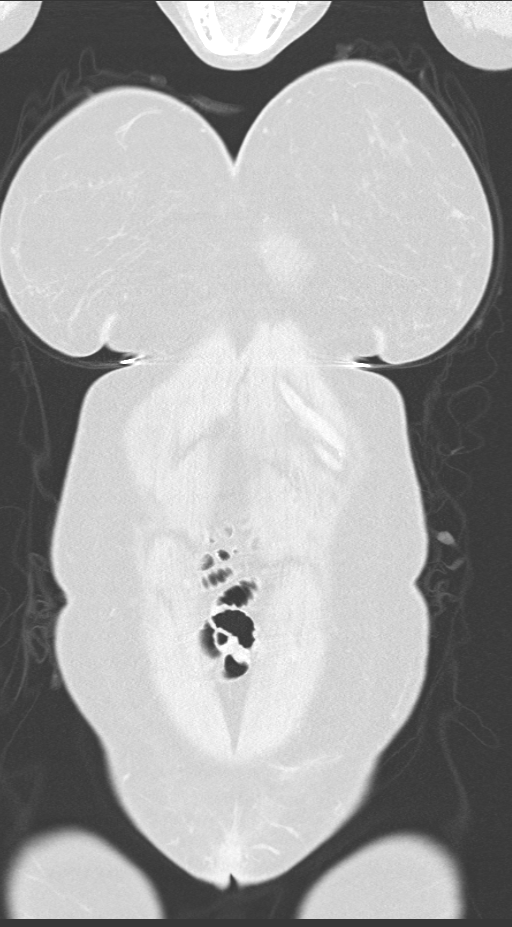
[im 41/101  lung]
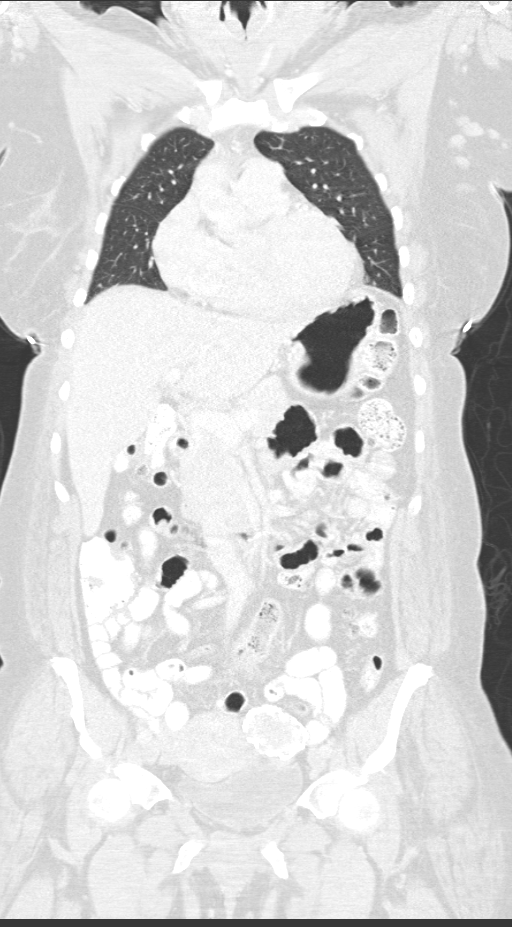
[im 61/101  lung]
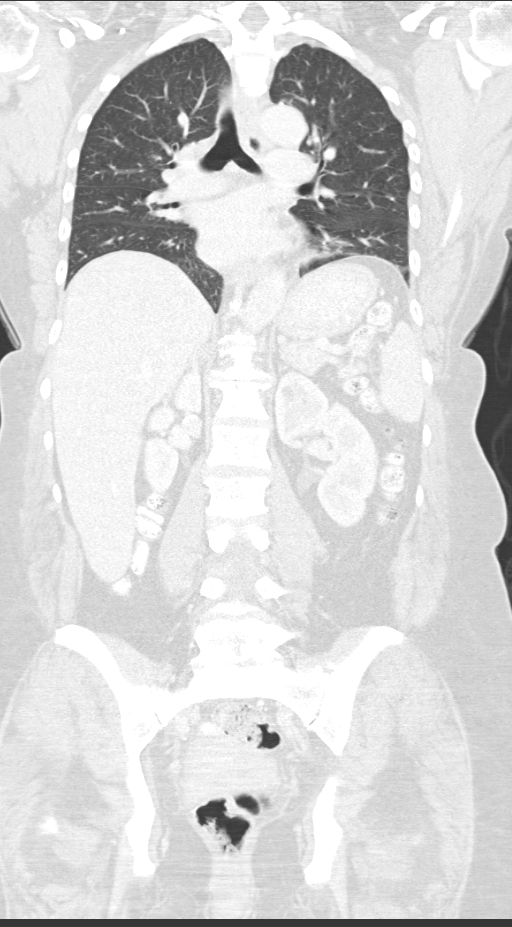

[12 of 36 positions shown; findings below may reference images not displayed]

FINDINGS: CT CHEST FINDINGS

Mediastinum/Lymph Nodes: Borderline to mild bilateral subpectoral
and axillary lymphadenopathy is evident. Dominant lymph node in the
right axilla measures 13 mm short axis. One of the dominant lymph
nodes in the left axilla measures 11 mm in short axis. No
mediastinal lymphadenopathy. There is no hilar lymphadenopathy. The
heart size is normal. No pericardial effusion. The esophagus has
normal imaging features.

Lungs/Pleura: Lungs are clear without focal airspace consolidation
or pulmonary edema. No pleural effusion. No suspicious pulmonary
nodule or mass.

Musculoskeletal: Bone windows reveal no worrisome lytic or sclerotic
osseous lesions.

CT ABDOMEN PELVIS FINDINGS

Hepatobiliary: 11 mm low-density lesion inferior right liver cannot
be fully characterized but is likely a cyst. Gallbladder is
unremarkable. No intrahepatic or extrahepatic biliary dilation.

Pancreas: No focal mass lesion. No dilatation of the main duct. No
intraparenchymal cyst. No peripancreatic edema.

Spleen: No splenomegaly. No focal mass lesion.

Adrenals/Urinary Tract: No adrenal nodule or mass. 13 mm water
density lesion in the upper pole left kidney is compatible with a
cyst. No evidence for enhancing lesion in either kidney. No evidence
for hydroureter. The urinary bladder appears normal for the degree
of distention.

Stomach/Bowel: Stomach is nondistended. No gastric wall thickening.
No evidence of outlet obstruction. Duodenum is normally positioned
as is the ligament of Treitz. No small bowel wall thickening. No
small bowel dilatation. The terminal ileum is normal. The appendix
is normal. No gross colonic mass. No colonic wall thickening. No
substantial diverticular change.

Vascular/Lymphatic: 2.2 x 1.6 x 2.0 cm saccular aneurysm of the
right renal artery noted. No abdominal aortic aneurysm. There is no
gastrohepatic or hepatoduodenal ligament lymphadenopathy. No
intraperitoneal or retroperitoneal lymphadenopathy. No pelvic
sidewall lymphadenopathy. One of the largest right groin lymph nodes
is 8 mm in short axis with an obvious fatty hilum.

Reproductive: Calcified fibroids are seen in the uterus including a
left pedunculated fundal fibroid. There is no adnexal mass.

Other: No intraperitoneal free fluid.

Musculoskeletal: Bone windows reveal no worrisome lytic or sclerotic
osseous lesions.
IMPRESSION: 1. Borderline to mildly enlarged lymph nodes in both subpectoral and
axillary regions without clear etiology.
2. 2.2 cm saccular aneurysm of the right renal artery. Consultation
with interventional radiology may prove helpful to assess treatment
options.
3. Calcified fibroids.
4. Probable hepatic and renal cysts.
These results will be called to the ordering clinician or
representative by the Radiologist Assistant, and communication
documented in the PACS or zVision Dashboard.

## 2017-08-09 ENCOUNTER — Telehealth: Payer: Self-pay | Admitting: *Deleted

## 2017-08-09 DIAGNOSIS — I10 Essential (primary) hypertension: Secondary | ICD-10-CM

## 2017-08-09 DIAGNOSIS — E785 Hyperlipidemia, unspecified: Secondary | ICD-10-CM

## 2017-08-09 NOTE — Telephone Encounter (Signed)
Patient aware.

## 2017-08-09 NOTE — Telephone Encounter (Signed)
Patient called wanting her lab order to be sent to [facility], patient would like a call back to let her know what type of labs will be sent.

## 2017-08-10 LAB — CBC
HEMATOCRIT: 38.8 % (ref 35.0–45.0)
Hemoglobin: 12.2 g/dL (ref 11.7–15.5)
MCH: 27.9 pg (ref 27.0–33.0)
MCHC: 31.4 g/dL — ABNORMAL LOW (ref 32.0–36.0)
MCV: 88.6 fL (ref 80.0–100.0)
MPV: 10.7 fL (ref 7.5–12.5)
PLATELETS: 208 10*3/uL (ref 140–400)
RBC: 4.38 10*6/uL (ref 3.80–5.10)
RDW: 11.9 % (ref 11.0–15.0)
WBC: 3.4 10*3/uL — ABNORMAL LOW (ref 3.8–10.8)

## 2017-08-10 LAB — LIPID PANEL
Cholesterol: 237 mg/dL — ABNORMAL HIGH (ref <200)
HDL: 57 mg/dL (ref >50)
LDL CHOLESTEROL (CALC): 161 mg/dL — AB
NON-HDL CHOLESTEROL (CALC): 180 mg/dL — AB (ref <130)
TRIGLYCERIDES: 86 mg/dL (ref <150)
Total CHOL/HDL Ratio: 4.2 (calc) (ref <5.0)

## 2017-08-10 LAB — COMPLETE METABOLIC PANEL WITH GFR
AG RATIO: 1.3 (calc) (ref 1.0–2.5)
ALT: 33 U/L — AB (ref 6–29)
AST: 36 U/L — AB (ref 10–35)
Albumin: 4.1 g/dL (ref 3.6–5.1)
Alkaline phosphatase (APISO): 102 U/L (ref 33–130)
BUN: 12 mg/dL (ref 7–25)
CALCIUM: 9.5 mg/dL (ref 8.6–10.4)
CO2: 38 mmol/L — AB (ref 20–32)
CREATININE: 0.83 mg/dL (ref 0.50–1.05)
Chloride: 99 mmol/L (ref 98–110)
GFR, EST AFRICAN AMERICAN: 92 mL/min/{1.73_m2} (ref >=60)
GFR, EST NON AFRICAN AMERICAN: 79 mL/min/{1.73_m2} (ref >=60)
GLOBULIN: 3.2 g/dL (ref 1.9–3.7)
Glucose, Bld: 86 mg/dL (ref 65–99)
POTASSIUM: 3.5 mmol/L (ref 3.5–5.3)
SODIUM: 143 mmol/L (ref 135–146)
TOTAL PROTEIN: 7.3 g/dL (ref 6.1–8.1)
Total Bilirubin: 0.6 mg/dL (ref 0.2–1.2)

## 2017-08-10 LAB — TSH: TSH: 1.45 mIU/L

## 2017-08-14 ENCOUNTER — Encounter: Payer: Self-pay | Admitting: Family Medicine

## 2017-08-14 ENCOUNTER — Ambulatory Visit (INDEPENDENT_AMBULATORY_CARE_PROVIDER_SITE_OTHER): Payer: Self-pay | Admitting: Family Medicine

## 2017-08-14 VITALS — BP 160/100 | HR 82 | Resp 16 | Ht 69.0 in | Wt 215.0 lb

## 2017-08-14 DIAGNOSIS — I10 Essential (primary) hypertension: Secondary | ICD-10-CM

## 2017-08-14 DIAGNOSIS — E785 Hyperlipidemia, unspecified: Secondary | ICD-10-CM

## 2017-08-14 DIAGNOSIS — E663 Overweight: Secondary | ICD-10-CM

## 2017-08-14 DIAGNOSIS — J309 Allergic rhinitis, unspecified: Secondary | ICD-10-CM

## 2017-08-14 MED ORDER — AMLODIPINE BESYLATE 10 MG PO TABS: 10.0000 mg | ORAL_TABLET | Freq: Every day | ORAL | 3 refills | Status: DC

## 2017-08-14 NOTE — Patient Instructions (Signed)
Nurse BP check in mid December  MD appt in August,  Stool test for  hidden blood  Then Fasting lipid and chem 7 1 week before August visit, call for lab sheet in June  Increase norvasc to 10 mg daily, may take TWO 5 mg tablets together till done  NEED to work on changing food choice and to eat regularly, need to lose weight goal of 10 to 15 pounds  It is important that you exercise regularly at least 30 minutes 5 times a week. If you develop chest pain, have severe difficulty breathing, or feel very tired, stop exercising immediately and seek medical attention    Needs to stop the nuts , fried and fatty foods, bad cholesterol is one and a half times normal and now have fatty liver again  Apply for free mammogram , system offers at different campuses throughout the year, need one in February  Free flu vaccines available in the community

## 2017-08-16 ENCOUNTER — Other Ambulatory Visit (HOSPITAL_COMMUNITY): Payer: Self-pay | Admitting: Interventional Radiology

## 2017-08-16 ENCOUNTER — Telehealth (HOSPITAL_COMMUNITY): Payer: Self-pay

## 2017-08-16 NOTE — Telephone Encounter (Signed)
Called to schedule, pt does not have any insurance at the moment. She will give [place] a call to schedule once she gets new insurance. AW

## 2017-08-17 NOTE — Assessment & Plan Note (Signed)
Uncontrolled , increase dos of amlodipine, return in 4 to 6 weeks for nurse BP check DASH diet and commitment to daily physical activity for a minimum of 30 minutes discussed and encouraged, as a part of hypertension management. The importance of attaining a healthy weight is also discussed.  BP/Weight 08/14/2017 05/28/2017 04/16/2017 02/27/2017 02/12/2017 02/15/7415 12/15/4534  Systolic BP 468 032 122 482 500 370 488  Diastolic BP 891 82 694 503 86 87 86  Wt. (Lbs) 215 197.4 209 208.8 - - 208.3  BMI 31.75 29.15 30.86 30.83 - - 30.76

## 2017-08-17 NOTE — Progress Notes (Signed)
   Allison Hart     MRN: [id number]      DOB: Mar 10, 1961   HPI Allison Hart is here for follow up and re-evaluation of chronic medical conditions, medication management and review of any available recent lab and radiology data.  Preventive health is reviewed , she is directed to community resources for flu vaccine and mammogram which are available at no cost. Has been under increased stress in last several months financially challenged, spouse recently became disabled , and as a hair stylist , she is the sole bread winner. She has been over eating with weight gain ROS Denies recent fever or chills. Denies sinus pressure, nasal congestion, ear pain or sore throat. Denies chest congestion, productive cough or wheezing. Denies chest pains, palpitations and leg swelling Denies abdominal pain, nausea, vomiting,diarrhea or constipation.   Denies dysuria, frequency, hesitancy or incontinence. Denies joint pain, swelling and limitation in mobility. Denies headaches, seizures, numbness, or tingling. Denies skin break down or rash.   PE  BP (!) 160/100   Pulse 82   Resp 16   Ht 5\' 9"  (1.753 m)   Wt 215 lb (97.5 kg)   SpO2 96%   BMI 31.75 kg/m   Patient alert and oriented and in no cardiopulmonary distress.  HEENT: No facial asymmetry, EOMI,   oropharynx pink and moist.  Neck supple no JVD, no mass.  Chest: Clear to auscultation bilaterally.  CVS: S1, S2 no murmurs, no S3.Regular rate.  ABD: Soft non tender.   Ext: No edema  MS: Adequate ROM spine, shoulders, hips and knees.  Skin: Intact, no ulcerations or rash noted.  Psych: Good eye contact, normal affect. Memory intact mildly  anxious not  depressed appearing.  CNS: CN 2-12 intact, power,  normal throughout.no focal deficits noted.   Assessment & Plan  Essential hypertension Uncontrolled , increase dos of amlodipine, return in 4 to 6 weeks for nurse BP check DASH diet and commitment to daily physical activity for a  minimum of 30 minutes discussed and encouraged, as a part of hypertension management. The importance of attaining a healthy weight is also discussed.  BP/Weight 08/14/2017 05/28/2017 04/16/2017 02/27/2017 02/12/2017 04/16/2955 11/09/3084  Systolic BP 578 469 629 528 413 244 010  Diastolic BP 272 82 536 644 86 87 86  Wt. (Lbs) 215 197.4 209 208.8 - - 208.3  BMI 31.75 29.15 30.86 30.83 - - 30.76       Overweight (BMI 25.0-29.9) Deteriorated. Patient re-educated about  the importance of commitment to a  minimum of 150 minutes of exercise per week.  The importance of healthy food choices with portion control discussed. Encouraged to start a food diary, count calories and to consider  joining a support group. Sample diet sheets offered. Goals set by the patient for the next several months.   Weight /BMI 08/14/2017 05/28/2017 04/16/2017  WEIGHT 215 lb 197 lb 6.4 oz 209 lb  HEIGHT 5\' 9"  5\' 9"  5\' 9"   BMI 31.75 kg/m2 29.15 kg/m2 30.86 kg/m2      Hyperlipidemia LDL goal <100 Hyperlipidemia:Low fat diet discussed and encouraged.   Lipid Panel  Lab Results  Component Value Date   CHOL 237 (H) 08/10/2017   HDL 57 08/10/2017   LDLCALC 98 05/22/2016   TRIG 86 08/10/2017   CHOLHDL 4.2 08/10/2017   Needs to reduce fried and fatty food in diet    Allergic rhinitis Controlled, no change in medication

## 2017-08-17 NOTE — Assessment & Plan Note (Signed)
Controlled, no change in medication  

## 2017-08-17 NOTE — Assessment & Plan Note (Signed)
Deteriorated. Patient re-educated about  the importance of commitment to a  minimum of 150 minutes of exercise per week.  The importance of healthy food choices with portion control discussed. Encouraged to start a food diary, count calories and to consider  joining a support group. Sample diet sheets offered. Goals set by the patient for the next several months.   Weight /BMI 08/14/2017 05/28/2017 04/16/2017  WEIGHT 215 lb 197 lb 6.4 oz 209 lb  HEIGHT 5\' 9"  5\' 9"  5\' 9"   BMI 31.75 kg/m2 29.15 kg/m2 30.86 kg/m2

## 2017-08-17 NOTE — Assessment & Plan Note (Signed)
Hyperlipidemia:Low fat diet discussed and encouraged.   Lipid Panel  Lab Results  Component Value Date   CHOL 237 (H) 08/10/2017   HDL 57 08/10/2017   LDLCALC 98 05/22/2016   TRIG 86 08/10/2017   CHOLHDL 4.2 08/10/2017   Needs to reduce fried and fatty food in diet

## 2017-08-28 ENCOUNTER — Other Ambulatory Visit: Payer: Self-pay | Admitting: Family Medicine

## 2017-10-03 ENCOUNTER — Other Ambulatory Visit: Payer: Self-pay

## 2017-10-03 DIAGNOSIS — J3089 Other allergic rhinitis: Secondary | ICD-10-CM

## 2017-10-03 MED ORDER — AZELASTINE HCL 0.1 % NA SOLN: 2.0000 | Freq: Two times a day (BID) | NASAL | 1 refills | Status: DC

## 2017-10-17 ENCOUNTER — Encounter: Payer: Self-pay | Admitting: Family Medicine

## 2017-10-17 ENCOUNTER — Ambulatory Visit (INDEPENDENT_AMBULATORY_CARE_PROVIDER_SITE_OTHER): Payer: Self-pay | Admitting: Family Medicine

## 2017-10-17 VITALS — BP 140/90 | HR 88 | Resp 16 | Ht 69.0 in | Wt 216.0 lb

## 2017-10-17 DIAGNOSIS — M79642 Pain in left hand: Secondary | ICD-10-CM

## 2017-10-17 DIAGNOSIS — E669 Obesity, unspecified: Secondary | ICD-10-CM

## 2017-10-17 DIAGNOSIS — I10 Essential (primary) hypertension: Secondary | ICD-10-CM

## 2017-10-17 DIAGNOSIS — E66811 Obesity, class 1: Secondary | ICD-10-CM

## 2017-10-17 MED ORDER — SPIRONOLACTONE 25 MG PO TABS: 25.0000 mg | ORAL_TABLET | Freq: Every day | ORAL | 4 refills | Status: DC

## 2017-10-17 MED ORDER — IBUPROFEN 800 MG PO TABS: 800.0000 mg | ORAL_TABLET | Freq: Three times a day (TID) | ORAL | 0 refills | Status: DC

## 2017-10-17 MED ORDER — PREDNISONE 5 MG (21) PO TBPK: 5.0000 mg | ORAL_TABLET | ORAL | 0 refills | Status: DC

## 2017-10-17 NOTE — Patient Instructions (Addendum)
Nurse BP check in 4 weeks  Keep MD f/u as before  Start spironolactone 25 mg one daily along with your other medications for blood pressure  For acute arthritis of left wrist, short course of prednisone and ibuprofen are prescribed  STOP aspirin while taking medication for wrist pain and swelling  It is important that you exercise regularly at least 30 minutes 5 times a week. If you develop chest pain, have severe difficulty breathing, or feel very tired, stop exercising immediately and seek medical attention   Please work on good  health habits so that your health will improve. 1. Commitment to daily physical activity for 30 to 60  minutes, if you are able to do this.  2. Commitment to wise food choices. Aim for half of your  food intake to be vegetable and fruit, one quarter starchy foods, and one quarter protein. Try to eat on a regular schedule  3 meals per day, snacking between meals should be limited to vegetables or fruits or small portions of nuts. 64 ounces of water per day is generally recommended, unless you have specific health conditions, like heart failure or kidney failure where you will need to limit fluid intake.  3. Commitment to sufficient and a  good quality of physical and mental rest daily, generally between 6 to 8 hours per day.  WITH PERSISTANCE AND PERSEVERANCE, THE IMPOSSIBLE , BECOMES THE NORM! Thank you  for choosing [facility]. We consider it a privelige to serve you.  Delivering excellent health care in a caring and  compassionate way is our goal.  Partnering with you,  so that together we can achieve this goal is our strategy.

## 2017-10-27 ENCOUNTER — Encounter: Payer: Self-pay | Admitting: Family Medicine

## 2017-10-27 DIAGNOSIS — M79642 Pain in left hand: Secondary | ICD-10-CM | POA: Insufficient documentation

## 2017-10-27 NOTE — Assessment & Plan Note (Signed)
Uncontrolled spironolactone added Nurse bp check in 3 t0 4 weeks DASH diet and commitment to daily physical activity for a minimum of 30 minutes discussed and encouraged, as a part of hypertension management. The importance of attaining a healthy weight is also discussed.  BP/Weight 10/17/2017 08/14/2017 05/28/2017 04/16/2017 02/27/2017 02/08/7942 11/15/6145  Systolic BP 092 957 473 403 709 643 838  Diastolic BP 90 184 82 037 110 86 87  Wt. (Lbs) 216 215 197.4 209 208.8 - -  BMI 31.9 31.75 29.15 30.86 30.83 - -

## 2017-10-27 NOTE — Progress Notes (Signed)
   Allison Hart     MRN: [id number]      DOB: 1961-06-08   HPI Allison Hart is here c/o  Acute wrist and forearm pain and swelling,no recent trauma,this is the first episode, grip is weak as a result Has taken some ibuprofen with some relief ROS Denies recent fever or chills. Denies sinus pressure, nasal congestion, ear pain or sore throat. Denies chest congestion, productive cough or wheezing. Denies chest pains, palpitations and leg swelling Denies abdominal pain, nausea, vomiting,diarrhea or constipation.   Denies dysuria, frequency, hesitancy or incontinence.  Denies headaches, seizures, numbness, or tingling. Denies depression, anxiety or insomnia.Less stress as spouse now has disability Denies skin break down or rash.   PE  BP 140/90   Pulse 88   Resp 16   Ht 5\' 9"  (1.753 m)   Wt 216 lb (98 kg)   SpO2 96%   BMI 31.90 kg/m   Patient alert and oriented and in no cardiopulmonary distress.  HEENT: No facial asymmetry, EOMI,   oropharynx pink and moist.  Neck supple no JVD, no mass.  Chest: Clear to auscultation bilaterally.  CVS: S1, S2 no murmurs, no S3.Regular rate.  ABD: Soft non tender.   Ext: No edema  MS: Adequate ROM spine, shoulders, hips and knees.Left wrist swollen , warm and tender and red with reduced ROM  Skin: Intact, no ulcerations or rash noted.  Psych: Good eye contact, normal affect. Memory intact not anxious or depressed appearing.  CNS: CN 2-12 intact, power,  normal throughout.no focal deficits noted.   Assessment & Plan  Essential hypertension Uncontrolled spironolactone added Nurse bp check in 3 t0 4 weeks DASH diet and commitment to daily physical activity for a minimum of 30 minutes discussed and encouraged, as a part of hypertension management. The importance of attaining a healthy weight is also discussed.  BP/Weight 10/17/2017 08/14/2017 05/28/2017 04/16/2017 02/27/2017 12/15/1827 06/11/7168  Systolic BP 678 938 101 751 140 025 852    Diastolic BP 90 778 82 242 110 86 87  Wt. (Lbs) 216 215 197.4 209 208.8 - -  BMI 31.9 31.75 29.15 30.86 30.83 - -       Hand pain, left Acute left hand pain, short course of ibuprofen and prednisone prescribed  Obesity (BMI 30.0-34.9) Deteriorated. Patient re-educated about  the importance of commitment to a  minimum of 150 minutes of exercise per week.  The importance of healthy food choices with portion control discussed. Encouraged to start a food diary, count calories and to consider  joining a support group. Sample diet sheets offered. Goals set by the patient for the next several months.   Weight /BMI 10/17/2017 08/14/2017 05/28/2017  WEIGHT 216 lb 215 lb 197 lb 6.4 oz  HEIGHT 5\' 9"  5\' 9"  5\' 9"   BMI 31.9 kg/m2 31.75 kg/m2 29.15 kg/m2

## 2017-10-27 NOTE — Assessment & Plan Note (Signed)
Acute left hand pain, short course of ibuprofen and prednisone prescribed

## 2017-10-27 NOTE — Assessment & Plan Note (Signed)
Deteriorated. Patient re-educated about  the importance of commitment to a  minimum of 150 minutes of exercise per week.  The importance of healthy food choices with portion control discussed. Encouraged to start a food diary, count calories and to consider  joining a support group. Sample diet sheets offered. Goals set by the patient for the next several months.   Weight /BMI 10/17/2017 08/14/2017 05/28/2017  WEIGHT 216 lb 215 lb 197 lb 6.4 oz  HEIGHT 5\' 9"  5\' 9"  5\' 9"   BMI 31.9 kg/m2 31.75 kg/m2 29.15 kg/m2

## 2017-10-29 ENCOUNTER — Other Ambulatory Visit: Payer: Self-pay | Admitting: Family Medicine

## 2017-11-13 ENCOUNTER — Other Ambulatory Visit (HOSPITAL_COMMUNITY): Payer: Self-pay | Admitting: Interventional Radiology

## 2017-11-13 DIAGNOSIS — I722 Aneurysm of renal artery: Secondary | ICD-10-CM

## 2017-11-16 ENCOUNTER — Other Ambulatory Visit: Payer: Self-pay | Admitting: Family Medicine

## 2017-11-19 ENCOUNTER — Other Ambulatory Visit: Payer: Self-pay

## 2017-11-19 ENCOUNTER — Ambulatory Visit: Payer: Self-pay

## 2017-11-19 ENCOUNTER — Telehealth: Payer: Self-pay | Admitting: Family Medicine

## 2017-11-19 VITALS — BP 136/84

## 2017-11-19 DIAGNOSIS — I10 Essential (primary) hypertension: Secondary | ICD-10-CM

## 2017-11-19 MED ORDER — FLUTICASONE PROPIONATE 50 MCG/ACT NA SUSP: 2.0000 | Freq: Every day | NASAL | 6 refills | Status: DC

## 2017-11-19 NOTE — Telephone Encounter (Signed)
She states her Astelin spray is $70- needs something else.

## 2017-11-19 NOTE — Telephone Encounter (Signed)
Sent in flonase  

## 2017-11-23 ENCOUNTER — Other Ambulatory Visit: Payer: Self-pay | Admitting: Family Medicine

## 2017-11-25 ENCOUNTER — Encounter: Payer: Self-pay | Admitting: Family Medicine

## 2018-01-03 ENCOUNTER — Telehealth: Payer: Self-pay

## 2018-01-03 ENCOUNTER — Other Ambulatory Visit: Payer: Self-pay

## 2018-01-03 ENCOUNTER — Encounter: Payer: Self-pay | Admitting: Family Medicine

## 2018-01-03 DIAGNOSIS — J3089 Other allergic rhinitis: Secondary | ICD-10-CM

## 2018-01-03 MED ORDER — FUROSEMIDE 40 MG PO TABS: 40.0000 mg | ORAL_TABLET | Freq: Every day | ORAL | 3 refills | Status: DC

## 2018-01-03 MED ORDER — AMLODIPINE BESYLATE 10 MG PO TABS: 10.0000 mg | ORAL_TABLET | Freq: Every day | ORAL | 3 refills | Status: DC

## 2018-01-03 MED ORDER — AZELASTINE HCL 0.1 % NA SOLN: 2.0000 | Freq: Two times a day (BID) | NASAL | 3 refills | Status: DC

## 2018-01-03 NOTE — Telephone Encounter (Signed)
Patient states she found a coupon on [facility] for acyclovir 800 #60 tabs and wants this sent to [place]. Can this be sent in?

## 2018-01-04 ENCOUNTER — Other Ambulatory Visit: Payer: Self-pay | Admitting: Family Medicine

## 2018-01-04 MED ORDER — ACYCLOVIR 800 MG PO TABS: 800.0000 mg | ORAL_TABLET | Freq: Every day | ORAL | 1 refills | Status: DC

## 2018-01-04 NOTE — Telephone Encounter (Signed)
Script is sent in 3#50 tabs I left a message

## 2018-03-20 ENCOUNTER — Telehealth: Payer: Self-pay | Admitting: Radiology

## 2018-03-20 NOTE — Telephone Encounter (Signed)
Patient reports that she still does not have insurance.  She has decided to postpone follow up with Dr. Laurence Ferrari.  She has requested that our office contact her Sept 2019 for update regarding employment and insurance.  Allison Hart Riki Rusk, RN 03/20/2018 11:05 AM

## 2018-03-24 ENCOUNTER — Other Ambulatory Visit: Payer: Self-pay | Admitting: Family Medicine

## 2018-04-05 ENCOUNTER — Other Ambulatory Visit: Payer: Self-pay | Admitting: Family Medicine

## 2018-04-26 ENCOUNTER — Other Ambulatory Visit: Payer: Self-pay | Admitting: Interventional Cardiology

## 2018-05-09 ENCOUNTER — Telehealth: Payer: Self-pay | Admitting: Family Medicine

## 2018-05-09 NOTE — Telephone Encounter (Signed)
Patient called in this morning to canc her followup for Monday. She wants you to know she will call back to rescheduled, but her son has cancer and they are in the hospital right now so she is not sure when she will reschedule.

## 2018-05-13 ENCOUNTER — Ambulatory Visit: Payer: Self-pay | Admitting: Family Medicine

## 2018-05-27 ENCOUNTER — Other Ambulatory Visit: Payer: Self-pay | Admitting: Interventional Cardiology

## 2018-05-27 MED ORDER — POTASSIUM CHLORIDE CRYS ER 20 MEQ PO TBCR: 20.0000 meq | EXTENDED_RELEASE_TABLET | Freq: Two times a day (BID) | ORAL | 0 refills | Status: DC

## 2018-05-27 NOTE — Addendum Note (Signed)
Addended by: Derl Barrow on: 05/27/2018 08:50 AM   Modules accepted: Orders

## 2018-05-29 ENCOUNTER — Other Ambulatory Visit: Payer: Self-pay | Admitting: Interventional Cardiology

## 2018-06-03 ENCOUNTER — Telehealth: Payer: Self-pay

## 2018-06-03 DIAGNOSIS — E785 Hyperlipidemia, unspecified: Secondary | ICD-10-CM

## 2018-06-03 DIAGNOSIS — I10 Essential (primary) hypertension: Secondary | ICD-10-CM

## 2018-06-03 NOTE — Telephone Encounter (Signed)
Labs re-ordered due to previous ones expiring. Patient in office and was given lab orders to take to lab with her.

## 2018-06-07 NOTE — Telephone Encounter (Signed)
noted 

## 2018-06-12 LAB — LIPID PANEL
Cholesterol: 217 mg/dL — ABNORMAL HIGH (ref <200)
HDL: 51 mg/dL (ref >50)
LDL Cholesterol (Calc): 147 mg/dL (calc) — ABNORMAL HIGH
NON-HDL CHOLESTEROL (CALC): 166 mg/dL — AB (ref <130)
Total CHOL/HDL Ratio: 4.3 (calc) (ref <5.0)
Triglycerides: 88 mg/dL (ref <150)

## 2018-06-12 LAB — BASIC METABOLIC PANEL
BUN: 16 mg/dL (ref 7–25)
CHLORIDE: 101 mmol/L (ref 98–110)
CO2: 34 mmol/L — ABNORMAL HIGH (ref 20–32)
CREATININE: 0.93 mg/dL (ref 0.50–1.05)
Calcium: 9.9 mg/dL (ref 8.6–10.4)
GLUCOSE: 87 mg/dL (ref 65–99)
Potassium: 4.1 mmol/L (ref 3.5–5.3)
SODIUM: 142 mmol/L (ref 135–146)

## 2018-06-18 ENCOUNTER — Telehealth: Payer: Self-pay | Admitting: Family Medicine

## 2018-06-18 ENCOUNTER — Ambulatory Visit: Payer: Self-pay | Admitting: Family Medicine

## 2018-06-18 NOTE — Telephone Encounter (Signed)
Pt called to advise that she needed to cxl her appt today, as her son is in [place] and they have called the family in.

## 2018-06-19 ENCOUNTER — Encounter: Payer: Self-pay | Admitting: Family Medicine

## 2018-06-19 ENCOUNTER — Other Ambulatory Visit: Payer: Self-pay | Admitting: Family Medicine

## 2018-06-19 ENCOUNTER — Other Ambulatory Visit: Payer: Self-pay

## 2018-06-19 MED ORDER — SPIRONOLACTONE 25 MG PO TABS: 25.0000 mg | ORAL_TABLET | Freq: Every day | ORAL | 5 refills | Status: DC

## 2018-06-19 MED ORDER — POTASSIUM CHLORIDE CRYS ER 20 MEQ PO TBCR: 20.0000 meq | EXTENDED_RELEASE_TABLET | Freq: Two times a day (BID) | ORAL | 5 refills | Status: DC

## 2018-06-20 NOTE — Telephone Encounter (Signed)
I tried to speak with her yesterday

## 2018-07-16 ENCOUNTER — Other Ambulatory Visit: Payer: Self-pay | Admitting: *Deleted

## 2018-07-16 ENCOUNTER — Other Ambulatory Visit (HOSPITAL_COMMUNITY): Payer: Self-pay | Admitting: Interventional Radiology

## 2018-07-16 DIAGNOSIS — I722 Aneurysm of renal artery: Secondary | ICD-10-CM

## 2018-07-29 ENCOUNTER — Encounter: Payer: Self-pay | Admitting: Family Medicine

## 2018-07-30 ENCOUNTER — Ambulatory Visit (INDEPENDENT_AMBULATORY_CARE_PROVIDER_SITE_OTHER): Payer: Self-pay | Admitting: Family Medicine

## 2018-07-30 ENCOUNTER — Encounter: Payer: Self-pay | Admitting: Family Medicine

## 2018-07-30 VITALS — BP 130/82 | HR 90 | Resp 12 | Ht 69.0 in | Wt 221.1 lb

## 2018-07-30 DIAGNOSIS — L03119 Cellulitis of unspecified part of limb: Secondary | ICD-10-CM

## 2018-07-30 DIAGNOSIS — I10 Essential (primary) hypertension: Secondary | ICD-10-CM

## 2018-07-30 DIAGNOSIS — E669 Obesity, unspecified: Secondary | ICD-10-CM

## 2018-07-30 MED ORDER — SULFAMETHOXAZOLE-TRIMETHOPRIM 800-160 MG PO TABS: 1.0000 | ORAL_TABLET | Freq: Two times a day (BID) | ORAL | 0 refills | Status: DC

## 2018-07-30 MED ORDER — ASPIRIN EC 81 MG PO TBEC: 81.0000 mg | DELAYED_RELEASE_TABLET | Freq: Every day | ORAL | 2 refills | Status: AC

## 2018-07-30 MED ORDER — FLUCONAZOLE 150 MG PO TABS: 150.0000 mg | ORAL_TABLET | Freq: Once | ORAL | 0 refills | Status: AC

## 2018-07-30 NOTE — Patient Instructions (Signed)
F/u in 6 months, call if you need me sooner  You are treated for cellulitis, antibiotic and 1 yeast pill are prescribed.  Do not scratch  BP is excellent , stay on medication  Check health dept for flu vaccine '

## 2018-07-31 ENCOUNTER — Encounter: Payer: Self-pay | Admitting: Family Medicine

## 2018-08-02 ENCOUNTER — Other Ambulatory Visit: Payer: Self-pay | Admitting: Family Medicine

## 2018-08-02 ENCOUNTER — Telehealth: Payer: Self-pay

## 2018-08-02 MED ORDER — CEPHALEXIN 500 MG PO CAPS: 500.0000 mg | ORAL_CAPSULE | Freq: Three times a day (TID) | ORAL | 0 refills | Status: AC

## 2018-08-02 MED ORDER — PREDNISONE 5 MG PO TABS: ORAL_TABLET | ORAL | 0 refills | Status: DC

## 2018-08-02 NOTE — Telephone Encounter (Signed)
Patient states that she is having a reaction to the septra antibiotic that she was started on. She has broke out in a rash all over and has no appetite. Advised to stop the medication and it was ok to take benadryl if needed and I would see what you recommend and wanted to change the septra to (and I added it to her allergy list)

## 2018-08-02 NOTE — Telephone Encounter (Signed)
I have prescribed keflex, pls let her know  May also need prednisone for 5 days ,for allergic reaction, and I have sent this in , she only needs to fill and  take this for the allergic reaction, if the benadryl has not been working, she should take the keflex as the antibiotic, it is a penicillin based med and this is not on her allergy list

## 2018-08-02 NOTE — Telephone Encounter (Signed)
Patient aware.

## 2018-08-03 ENCOUNTER — Encounter: Payer: Self-pay | Admitting: Family Medicine

## 2018-08-03 DIAGNOSIS — L0390 Cellulitis, unspecified: Secondary | ICD-10-CM | POA: Insufficient documentation

## 2018-08-03 NOTE — Progress Notes (Signed)
   Allison Hart     MRN: [id number]      DOB: May 09, 1961   HPI Allison Hart is here for follow up , unfortunately she recently lost her only child, as son, to pancreatic cancer. Dealing with it 1 day at a time as best able, says his quality of life went downhill extremely fast  C/o  Rash on legfs where she has been scratching with open sores   ROS Denies recent fever or chills. Denies sinus pressure, nasal congestion, ear pain or sore throat. Denies chest congestion, productive cough or wheezing. Denies chest pains, palpitations and leg swelling Denies abdominal pain, nausea, vomiting,diarrhea or constipation.   Denies dysuria, frequency, hesitancy or incontinence. Denies joint pain, swelling and limitation in mobility. Denies headaches, seizures, numbness, or tingling.  PE  BP 130/82 (BP Location: Right Arm, Patient Position: Sitting, Cuff Size: Normal)   Pulse 90   Resp 12   Ht 5\' 9"  (1.753 m)   Wt 221 lb 1.9 oz (100.3 kg)   SpO2 96% Comment: room air  BMI 32.65 kg/m   Patient alert and oriented and in no cardiopulmonary distress.  HEENT: No facial asymmetry, EOMI,   oropharynx pink and moist.  Neck supple no JVD, no mass.  Chest: Clear to auscultation bilaterally.  CVS: S1, S2 no murmurs, no S3.Regular rate.  ABD: Soft non tender.   Ext: No edema  MS: Adequate ROM spine, shoulders, hips and knees.  Skin:ulceration with hyperemia on lower extremities Psych: Good eye contact, normal affect. Memory intact not anxious mildly  depressed appearing.  CNS: CN 2-12 intact, power,  normal throughout.no focal deficits noted.   Assessment & Plan  Cellulitis antibiotoc and fluconazole prescribed, educated re need to avoid scratching  Essential hypertension Controlled, no change in medication DASH diet and commitment to daily physical activity for a minimum of 30 minutes discussed and encouraged, as a part of hypertension management. The importance of attaining a  healthy weight is also discussed.  BP/Weight 07/30/2018 11/19/2017 10/17/2017 08/14/2017 05/28/2017 04/16/2017 3/83/2919  Systolic BP 166 060 045 997 741 423 953  Diastolic BP 82 84 90 202 82 100 110  Wt. (Lbs) 221.12 - 216 215 197.4 209 208.8  BMI 32.65 - 31.9 31.75 29.15 30.86 30.83       Obesity (BMI 30.0-34.9) Deteriorated. Patient re-educated about  the importance of commitment to a  minimum of 150 minutes of exercise per week.  The importance of healthy food choices with portion control discussed. Encouraged to start a food diary, count calories and to consider  joining a support group. Sample diet sheets offered. Goals set by the patient for the next several months.   Weight /BMI 07/30/2018 10/17/2017 08/14/2017  WEIGHT 221 lb 1.9 oz 216 lb 215 lb  HEIGHT 5\' 9"  5\' 9"  5\' 9"   BMI 32.65 kg/m2 31.9 kg/m2 31.75 kg/m2

## 2018-08-03 NOTE — Assessment & Plan Note (Signed)
Controlled, no change in medication DASH diet and commitment to daily physical activity for a minimum of 30 minutes discussed and encouraged, as a part of hypertension management. The importance of attaining a healthy weight is also discussed.  BP/Weight 07/30/2018 11/19/2017 10/17/2017 08/14/2017 05/28/2017 04/16/2017 7/71/1657  Systolic BP 903 833 383 291 916 606 004  Diastolic BP 82 84 90 599 82 100 110  Wt. (Lbs) 221.12 - 216 215 197.4 209 208.8  BMI 32.65 - 31.9 31.75 29.15 30.86 30.83

## 2018-08-03 NOTE — Assessment & Plan Note (Signed)
antibiotoc and fluconazole prescribed, educated re need to avoid scratching

## 2018-08-03 NOTE — Assessment & Plan Note (Signed)
Deteriorated. Patient re-educated about  the importance of commitment to a  minimum of 150 minutes of exercise per week.  The importance of healthy food choices with portion control discussed. Encouraged to start a food diary, count calories and to consider  joining a support group. Sample diet sheets offered. Goals set by the patient for the next several months.   Weight /BMI 07/30/2018 10/17/2017 08/14/2017  WEIGHT 221 lb 1.9 oz 216 lb 215 lb  HEIGHT 5\' 9"  5\' 9"  5\' 9"   BMI 32.65 kg/m2 31.9 kg/m2 31.75 kg/m2

## 2018-08-05 ENCOUNTER — Ambulatory Visit: Payer: Self-pay | Admitting: Family Medicine

## 2018-08-06 ENCOUNTER — Ambulatory Visit (HOSPITAL_COMMUNITY)
Admission: RE | Admit: 2018-08-06 | Discharge: 2018-08-06 | Disposition: A | Discharge status: Home / Self Care | Payer: Self-pay | Source: Ambulatory Visit | Attending: Interventional Radiology | Admitting: Interventional Radiology

## 2018-08-06 ENCOUNTER — Encounter (HOSPITAL_COMMUNITY): Payer: Self-pay

## 2018-08-06 ENCOUNTER — Ambulatory Visit
Admission: RE | Admit: 2018-08-06 | Discharge: 2018-08-06 | Disposition: A | Discharge status: Home / Self Care | Payer: No Typology Code available for payment source | Source: Ambulatory Visit | Attending: Interventional Radiology | Admitting: Interventional Radiology

## 2018-08-06 ENCOUNTER — Telehealth: Payer: Self-pay | Admitting: Family Medicine

## 2018-08-06 DIAGNOSIS — I722 Aneurysm of renal artery: Secondary | ICD-10-CM

## 2018-08-06 HISTORY — PX: IR RADIOLOGIST EVAL & MGMT: IMG5224

## 2018-08-06 LAB — POCT I-STAT CREATININE: CREATININE: 1 mg/dL (ref 0.44–1.00)

## 2018-08-06 MED ORDER — IOPAMIDOL (ISOVUE-370) INJECTION 76%
100.0000 mL | Freq: Once | INTRAVENOUS | Status: AC | PRN
  Administered 2018-08-06: 100 mL via INTRAVENOUS

## 2018-08-06 MED ORDER — IOPAMIDOL (ISOVUE-370) INJECTION 76%
INTRAVENOUS | Status: AC
  Filled 2018-08-06: qty 100

## 2018-08-06 MED ORDER — SODIUM CHLORIDE 0.9 % IJ SOLN
INTRAMUSCULAR | Status: AC
  Filled 2018-08-06: qty 50

## 2018-08-06 NOTE — Progress Notes (Signed)
.       Chief Complaint: Patient was seen in follow-up today for  Chief Complaint  Patient presents with  . Follow-up   at the request of [place]  Referring Physician(s): Jaylun Fleener  History of Present Illness: Allison Hart is a 57 y.o. 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 She underwent endovascular repair with placement of coaxial overlapping pipeline flow diverting stents earlier this spring and now presents for scheduled follow-up.  Medically, Ms. Brinley is asymptomatic. She denies flank pain, hematuria or dysuria.  Unfortunately, she has been dealing with quite a bit in her personal life including the loss of her son to complications from pancreatic cancer and multiple recent health problems.  Today, she is asymptomatic.  In light of the abnormal findings in the gastric fundus, I questioned her specifically about symptoms of mid epigastric or chest pain, early satiety, pain with eating, nausea, vomiting, fever, chills, night sweats and weight loss.  She denies all of these symptoms.  Past Medical History:  Diagnosis Date  . Anxiety   . Arthritis   . Essential hypertension, benign   . GERD (gastroesophageal reflux disease)   . Herpes   . Murmur   . Obesity   . Sinusitis   . Urinary frequency     Past Surgical History:  Procedure Laterality Date  . COLONOSCOPY  11/13/2012   Procedure: COLONOSCOPY;  Surgeon: Rogene Houston, MD;  [facility];  Service: Endoscopy;  Laterality: N/A;  930  . embolization of uterine artery X 2    . INGUINAL LYMPH NODE BIOPSY Right 04/14/2016   Procedure: RIGHT INGUINAL LYMPH NODE BIOPSY;  Surgeon: Aviva Signs, MD;  Location: [facility];  Service: General;  Laterality: Right;  . IR ANGIO INTRA EXTRACRAN SEL COM CAROTID INNOMINATE BILAT MOD SED  02/12/2017  . IR ANGIO VERTEBRAL SEL VERTEBRAL BILAT MOD SED  02/12/2017  . IR GENERIC HISTORICAL  04/27/2016   IR  RADIOLOGIST EVAL & MGMT 04/27/2016 Jacqulynn Cadet, MD [facility] INTERV RAD  . IR GENERIC HISTORICAL  12/05/2016   IR RADIOLOGIST EVAL & MGMT 12/05/2016 Jacqulynn Cadet, MD [facility]  . IR RADIOLOGIST EVAL & MGMT  01/22/2017  . IR RADIOLOGIST EVAL & MGMT  08/06/2018  . Left inguinal hernia repair and endometriosis  2007   No definitive records   . Left parotid bx benign  2005  . RADIOLOGY WITH ANESTHESIA N/A 01/18/2016   Procedure: RADIOLOGY WITH ANESTHESIA;  Surgeon: Luanne Bras, MD;  Location: [place];  Service: Radiology;  Laterality: N/A;  . RADIOLOGY WITH ANESTHESIA N/A 02/12/2017   Procedure: Cherie Dark EMBOLIZATION;  Surgeon: Luanne Bras, MD;  Location: [place];  Service: Radiology;  Laterality: N/A;  . renal artery aneurysm repair right Right 01/2016  . UMBILICAL HERNIA REPAIR  infancy     Allergies: Versed [midazolam] and Sulfa antibiotics  Medications: Prior to Admission medications   Medication Sig Start Date End Date Taking? Authorizing Provider  acyclovir (ZOVIRAX) 800 MG tablet Take 1 tablet (800 mg total) by mouth 5 (five) times daily. 01/04/18   Fayrene Helper, MD  amLODipine (NORVASC) 10 MG tablet Take 1 tablet (10 mg total) by mouth daily. 01/03/18   Fayrene Helper, MD  aspirin EC 81 MG tablet Take 1 tablet (81 mg total) by mouth daily. 07/30/18   Fayrene Helper, MD  cephALEXin (KEFLEX) 500 MG capsule Take 1 capsule (500 mg total)  by mouth 3 (three) times daily for 7 days. 08/02/18 08/09/18  Fayrene Helper, MD  cholecalciferol (VITAMIN D) 1000 units tablet Take 1,000 Units by mouth daily.    [provider]  furosemide (LASIX) 40 MG tablet Take 1 tablet (40 mg total) by mouth daily. 01/03/18 07/30/18  Fayrene Helper, MD  loratadine (CLARITIN) 10 MG tablet TAKE 1 TABLET BY MOUTH ONCE DAILY 11/23/17   Fayrene Helper, MD  potassium chloride SA (K-DUR,KLOR-CON) 20 MEQ tablet Take 1 tablet (20 mEq total) by mouth 2 (two) times daily.  06/19/18   Fayrene Helper, MD  predniSONE (DELTASONE) 5 MG tablet Take one tablet two times daily for 5 days for allergic reaction 08/02/18   Fayrene Helper, MD  Pyridoxine HCl (VITAMIN B-6 PO) Take 1 tablet by mouth daily.    [provider]  spironolactone (ALDACTONE) 25 MG tablet Take 1 tablet (25 mg total) by mouth daily. 06/19/18   Fayrene Helper, MD  sulfamethoxazole-trimethoprim (BACTRIM DS,SEPTRA DS) 800-160 MG tablet Take 1 tablet by mouth 2 (two) times daily. 07/30/18   Fayrene Helper, MD     Family History  Problem Relation Age of Onset  . Hypertension Mother   . Hyperlipidemia Mother   . Thyroid disease Mother   . Coronary artery disease Mother   . Irregular heart beat Mother   . Fibroids Sister   . Hypertension Sister   . Ulcerative colitis Son   . Colon cancer Neg Hx     Social History   Socioeconomic History  . Marital status: Married    Spouse name: Not on file  . Number of children: Not on file  . Years of education: Not on file  . Highest education level: Not on file  Occupational History  . Not on file  Social Needs  . Financial resource strain: Not on file  . Food insecurity:    Worry: Not on file    Inability: Not on file  . Transportation needs:    Medical: Not on file    Non-medical: Not on file  Tobacco Use  . Smoking status: Former Smoker    Packs/day: 0.25    Years: 5.00    Pack years: 1.25    Types: Cigarettes    Last attempt to quit: 1997    Years since quitting: 22.8  . Smokeless tobacco: Never Used  Substance and Sexual Activity  . Alcohol use: Yes    Comment: occasionally  . Drug use: No  . Sexual activity: Not on file  Lifestyle  . Physical activity:    Days per week: Not on file    Minutes per session: Not on file  . Stress: Not on file  Relationships  . Social connections:    Talks on phone: Not on file    Gets together: Not on file    Attends religious service: Not on file    Active member of  club or organization: Not on file    Attends meetings of clubs or organizations: Not on file    Relationship status: Not on file  Other Topics Concern  . Not on file  Social History Narrative  . Not on file    Review of Systems: A 12 point ROS discussed and pertinent positives are indicated in the HPI above.  All other systems are negative.  Review of Systems  Vital Signs: BP (!) 142/85   Pulse 72   Temp 98.3 F (36.8 C) (Oral)  Resp 20   Ht 5\' 9"  (1.753 m)   Wt 100.2 kg   SpO2 99%   BMI 32.64 kg/m   Physical Exam  Constitutional: She is oriented to person, place, and time. She appears well-developed and well-nourished. No distress.  HENT:  Head: Normocephalic and atraumatic.  Eyes: No scleral icterus.  Cardiovascular: Normal rate.  Pulmonary/Chest: Effort normal.  Abdominal: Soft. She exhibits no distension. There is no tenderness.  Neurological: She is alert and oriented to person, place, and time.  Skin: Skin is warm and dry.  Psychiatric: She has a normal mood and affect. Her behavior is normal.  Nursing note and vitals reviewed.   Imaging: Ct Angio Abdomen W &/or Wo Contrast  Result Date: 08/06/2018 CLINICAL DATA:  complex right renal artery aneurysm measuring up to 2.4 cm. She underwent endovascular repair with placement of coaxial overlapping pipeline flow diverting stents 2017 and now presents for scheduled follow-up. EXAM: CT ANGIOGRAPHY ABDOMEN TECHNIQUE: Multidetector CT imaging of the abdomen was performed using the standard protocol during bolus administration of intravenous contrast. Multiplanar reconstructed images and MIPs were obtained and reviewed to evaluate the vascular anatomy. CONTRAST:  141mL ISOVUE-370 IOPAMIDOL (ISOVUE-370) INJECTION 76% COMPARISON:  12/05/2016 and previous FINDINGS: VASCULAR Aorta: Mild tortuosity. No aneurysm, dissection, or stenosis. Minimal plaque. Celiac: Patent without evidence of aneurysm, dissection, vasculitis or  significant stenosis. SMA: Patent without evidence of aneurysm, dissection, vasculitis or significant stenosis. Replaced right hepatic arterial supply, an anatomic variant. Renals: Single left, widely patent Single right, widely patent. Continued patency of stent across the bifurcation of the main renal artery with thrombosis throughout much of the bifurcation aneurysm. There is some high attenuation centrally within the dominant peripherally calcified anterior moiety of the aneurysm which in the absence of any precontrast images may represent calcification or small amount of persistent flow. The anterior moiety of the aneurysm complex measures 2.2 cm maximum transverse diameter, stable. Posterior moiety measures 14 mm maximum transverse diameter as before, with continued flow into the posterior division. Peripheral branch vessels unremarkable. IMA: Patent without evidence of aneurysm, dissection, vasculitis or significant stenosis. Inflow: Minimal calcified plaque in the proximal common iliac arteries with mild tortuosity, no dissection or stenosis. Veins: Patent hepatic veins, portal vein, SMV, splenic vein, bilateral renal veins, IVC. Circumaortic left renal vein, an anatomic variant. No venous pathology identified. Review of the MIP images confirms the above findings. NON-VASCULAR Lower chest: No acute abnormality. Hepatobiliary: No focal liver abnormality is seen. No gallstones, gallbladder wall thickening, or biliary dilatation. Pancreas: Unremarkable. No pancreatic ductal dilatation or surrounding inflammatory changes. Spleen: Normal in size without focal abnormality. Adrenals/Urinary Tract: Normal adrenals. 1.9 cm left upper pole renal cyst. Mild diffuse right renal parenchymal atrophy. Right renal artery bifurcation aneurysm as detailed above. No solid renal mass or hydronephrosis. Stomach/Bowel: Stomach is incompletely distended. Diffuse wall thickening with enhancement in the fundus is suspected.  Visualized portions of small bowel and colon are nondilated. Scattered colonic diverticula. Lymphatic: No abdominal or mesenteric adenopathy. Other: No ascites.  No free air. Musculoskeletal: Anterior vertebral endplate spurring at multiple levels in the thoracolumbar spine. No fracture or worrisome bone lesion. IMPRESSION: VASCULAR 1. Stable appearance of partially thrombosed bilobed right renal artery bifurcation aneurysm post intervention. 2. No acute findings. NON-VASCULAR 1. Suspected diffuse wall thickening in the gastric fundus. Consider upper GI or endoscopy to exclude lymphoma or other infiltrative process. These results will be called to the ordering clinician or representative by the Radiologist Assistant, and communication documented in  the PACS or zVision Dashboard. Electronically Signed   By: Lucrezia Europe M.D.   On: 08/06/2018 10:06   Ir Radiologist Eval & Mgmt  Result Date: 08/06/2018 Please refer to notes tab for details about interventional procedure. (Op Note)   Labs:  CBC: Recent Labs    08/10/17 0837  WBC 3.4*  HGB 12.2  HCT 38.8  PLT 208    COAGS: No results for input(s): INR, APTT in the last 8760 hours.  BMP: Recent Labs    08/10/17 0837 06/12/18 0822 08/06/18 0824  NA 143 142  --   K 3.5 4.1  --   CL 99 101  --   CO2 38* 34*  --   GLUCOSE 86 87  --   BUN 12 16  --   CALCIUM 9.5 9.9  --   CREATININE 0.83 0.93 1.00  GFRNONAA 79  --   --   GFRAA 92  --   --     LIVER FUNCTION TESTS: Recent Labs    08/10/17 0837  BILITOT 0.6  AST 36*  ALT 33*  PROT 7.3    TUMOR MARKERS: No results for input(s): AFPTM, CEA, CA199, CHROMGRNA in the last 8760 hours.  Assessment and Plan:  Ms. Nordhoff is doing very well 2.5 years status post a flow diverting stent repair of a complex bilobed right renal artery aneurysm.  Her aneurysm remains thrombosed and stable in size on follow-up CTA.  However, she does have an unexpected finding of enhancing and slightly  irregular thickening of the gastric wall in the region of the fundus.  These findings are concerning for underlying gastritis or gastric neoplasm/lymphoma.  1.  Return to interventional radiology clinic with repeat CT arteriogram of the abdomen in 2 years.   2.  Recommend referral to gastroenterology for endoscopy to evaluate the abnormal CT findings in the gastric fundus.  Electronically Signed: Jacqulynn Cadet 08/06/2018, 12:39 PM   I spent a total of  15 Minutes in face to face in clinical consultation, greater than 50% of which was counseling/coordinating care for renal artery aneurysm.

## 2018-08-06 NOTE — Telephone Encounter (Signed)
Allison Hart from [facility] radiology call report at 10:23 am. Scan printed and sat scan on desk to make her aware.

## 2018-08-06 NOTE — Telephone Encounter (Signed)
I discussed the scan report with pt , was finally able to contact her, the iR had already made her aware that there was abnormality. Of note she has diffuse thickening of her stomach, unclear etiology, she is being referred  asap to GI for eval and management

## 2018-08-06 NOTE — Telephone Encounter (Signed)
Please call [facility] at [phone]-- CALL REPORT

## 2018-08-07 ENCOUNTER — Other Ambulatory Visit: Payer: Self-pay | Admitting: Family Medicine

## 2018-08-07 DIAGNOSIS — R935 Abnormal findings on diagnostic imaging of other abdominal regions, including retroperitoneum: Secondary | ICD-10-CM

## 2018-08-07 NOTE — Progress Notes (Signed)
amb gastro  

## 2018-08-08 ENCOUNTER — Encounter: Payer: Self-pay | Admitting: Internal Medicine

## 2018-08-12 ENCOUNTER — Encounter: Payer: Self-pay | Admitting: Family Medicine

## 2018-08-13 ENCOUNTER — Encounter: Payer: Self-pay | Admitting: Nurse Practitioner

## 2018-08-13 ENCOUNTER — Encounter: Payer: Self-pay | Admitting: *Deleted

## 2018-08-13 ENCOUNTER — Ambulatory Visit (INDEPENDENT_AMBULATORY_CARE_PROVIDER_SITE_OTHER): Payer: Self-pay | Admitting: Nurse Practitioner

## 2018-08-13 ENCOUNTER — Other Ambulatory Visit: Payer: Self-pay | Admitting: *Deleted

## 2018-08-13 DIAGNOSIS — R935 Abnormal findings on diagnostic imaging of other abdominal regions, including retroperitoneum: Secondary | ICD-10-CM

## 2018-08-13 NOTE — Progress Notes (Signed)
Primary Care Physician:  Fayrene Helper, MD Primary Gastroenterologist:  Dr. Gala Romney  Chief Complaint  Patient presents with  . abnormal CT    HPI:   Allison Hart is a 57 y.o. female who presents on referral from primary care for abnormal CT of the abdomen demonstrating thickening of the gastric fundus.  Her son recently died at a young age (in his 6s) from pancreatic cancer.  Reviewed the most recent primary care office visit dated 07/30/2018 which were for cellulitis, hypertension, obesity.  Discussed the recent loss of her son and "dealing with it one day at a time."  Last colonoscopy that could be found in our system was dated 11/13/2012.  Findings include scattered diverticula, no evidence of polyps, small external hemorrhoids.  Recommended repeat exam in 10 years (2024).  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  Her vascular evaluation status post stenting was essentially normal.  However, at that time they noted suspected diffuse wall thickening in the gastric fundus and recommended upper GI endoscopy.  Today she states she's doing ok overall. Taking one day at a time. Denies significant abdominal pain (but hasn't been paying attention). Has been under a lot of stress. Was recently on an antibiotic for cellulitis and developed a rash (is now on her allergy list "sulfa antibiotics"). Denies N/V, GERD symptoms, hematochezia, melena, fever, chills, unintentional weight loss. Denies chest pain, dyspnea, dizziness, lightheadedness, syncope, near syncope. Denies any other upper or lower GI symptoms.  Past Medical History:  Diagnosis Date  . Anxiety   . Arthritis   . Essential hypertension, benign   . GERD (gastroesophageal reflux disease)   . Herpes   . Murmur   . Obesity   . Sinusitis   . Urinary frequency      Past Surgical History:  Procedure Laterality Date  . COLONOSCOPY  11/13/2012   Procedure: COLONOSCOPY;  Surgeon: Rogene Houston, MD;  [facility];  Service: Endoscopy;  Laterality: N/A;  930  . embolization of uterine artery X 2    . INGUINAL LYMPH NODE BIOPSY Right 04/14/2016   Procedure: RIGHT INGUINAL LYMPH NODE BIOPSY;  Surgeon: Aviva Signs, MD;  Location: [facility];  Service: General;  Laterality: Right;  . IR ANGIO INTRA EXTRACRAN SEL COM CAROTID INNOMINATE BILAT MOD SED  02/12/2017  . IR ANGIO VERTEBRAL SEL VERTEBRAL BILAT MOD SED  02/12/2017  . IR GENERIC HISTORICAL  04/27/2016   IR RADIOLOGIST EVAL & MGMT 04/27/2016 Jacqulynn Cadet, MD [facility] INTERV RAD  . IR GENERIC HISTORICAL  12/05/2016   IR RADIOLOGIST EVAL & MGMT 12/05/2016 Jacqulynn Cadet, MD [facility] INTER[facility] RAD  . IR RADIOLOGIST EVAL & MGMT  01/22/2017  . IR RADIOLOGIST EVAL & MGMT  08/06/2018  . Left inguinal hernia repair and endometriosis  2007   No definitive records   . Left parotid bx benign  2005  . RADIOLOGY WITH ANESTHESIA N/A 01/18/2016   Procedure: RADIOLOGY WITH ANESTHESIA;  Surgeon: Luanne Bras, MD;  Location: [place];  Service: Radiology;  Laterality: N/A;  . RADIOLOGY WITH ANESTHESIA N/A 02/12/2017   Procedure: Cherie Dark EMBOLIZATION;  Surgeon: Luanne Bras, MD;  Location: [place];  Service: Radiology;  Laterality: N/A;  . renal artery aneurysm repair right Right 01/2016  . UMBILICAL HERNIA REPAIR  infancy     Current Outpatient Medications  Medication  Sig Dispense Refill  . acyclovir (ZOVIRAX) 800 MG tablet Take 1 tablet (800 mg total) by mouth 5 (five) times daily. (Patient taking differently: Take 800 mg by mouth as needed. ) 50 tablet 1  . amLODipine (NORVASC) 10 MG tablet Take 1 tablet (10 mg total) by mouth daily. 90 tablet 3  . aspirin EC 81 MG tablet Take 1 tablet (81 mg total) by mouth daily. 100 tablet 2  . cholecalciferol (VITAMIN D) 1000 units tablet Take 1,000 Units by mouth daily.     . furosemide (LASIX) 40 MG tablet Take 1 tablet (40 mg total) by mouth daily. 90 tablet 3  . loratadine (CLARITIN) 10 MG tablet TAKE 1 TABLET BY MOUTH ONCE DAILY 90 tablet 3  . potassium chloride SA (K-DUR,KLOR-CON) 20 MEQ tablet Take 1 tablet (20 mEq total) by mouth 2 (two) times daily. 60 tablet 5  . Pyridoxine HCl (VITAMIN B-6 PO) Take 1 tablet by mouth daily.    Marland Kitchen spironolactone (ALDACTONE) 25 MG tablet Take 1 tablet (25 mg total) by mouth daily. 30 tablet 5   No current facility-administered medications for this visit.     Allergies as of 08/13/2018 - Review Complete 08/13/2018  Allergen Reaction Noted  . Versed [midazolam] Shortness Of Breath and Other (See Comments) 01/18/2016  . Sulfa antibiotics  08/02/2018    Family History  Problem Relation Age of Onset  . Hypertension Mother   . Hyperlipidemia Mother   . Thyroid disease Mother   . Coronary artery disease Mother   . Irregular heart beat Mother   . Fibroids Sister   . Hypertension Sister   . Ulcerative colitis Son   . Colon cancer Neg Hx     Social History   Socioeconomic History  . Marital status: Married    Spouse name: Not on file  . Number of children: Not on file  . Years of education: Not on file  . Highest education level: Not on file  Occupational History  . Not on file  Social Needs  . Financial resource strain: Not on file  . Food insecurity:    Worry: Not on file    Inability: Not on file  . Transportation needs:    Medical: Not on file    Non-medical: Not on file  Tobacco Use  . Smoking status: Former Smoker    Packs/day: 0.25    Years: 5.00    Pack years: 1.25    Types: Cigarettes    Last attempt to quit: 1997    Years since quitting: 22.8  . Smokeless tobacco: Never Used  Substance and Sexual Activity  . Alcohol use: Yes    Comment: rare  . Drug use: No  . Sexual activity: Not on file  Lifestyle  . Physical activity:    Days per week: Not on file    Minutes per session: Not on  file  . Stress: Not on file  Relationships  . Social connections:    Talks on phone: Not on file    Gets together: Not on file    Attends religious service: Not on file    Active member of club or organization: Not on file    Attends meetings of clubs or organizations: Not on file    Relationship status: Not on file  . Intimate partner violence:    Fear of current or ex partner: Not on file    Emotionally abused: Not on file    Physically abused: Not on file  Forced sexual activity: Not on file  Other Topics Concern  . Not on file  Social History Narrative  . Not on file    Review of Systems: General: Negative for anorexia, weight loss, fever, chills, fatigue, weakness. ENT: Negative for hoarseness, difficulty swallowing. CV: Negative for chest pain, angina, palpitations, peripheral edema.  Respiratory: Negative for dyspnea at rest, cough, sputum, wheezing.  GI: See history of present illness. MS: Negative for joint pain, low back pain.  Derm: Negative for rash or itching.  Endo: Negative for unusual weight change.  Heme: Negative for bruising or bleeding. Allergy: Negative for rash or hives.    Physical Exam: BP (!) 138/93   Pulse 76   Temp 98.8 F (37.1 C) (Oral)   Ht 5\' 9"  (1.753 m)   Wt 215 lb 3.2 oz (97.6 kg)   BMI 31.78 kg/m  General:   Alert and oriented. Pleasant and cooperative. Well-nourished and well-developed.  Eyes:  Without icterus, sclera clear and conjunctiva pink.  Ears:  Normal auditory acuity. Cardiovascular:  S1, S2 present without murmurs appreciated. Extremities without clubbing or edema. Respiratory:  Clear to auscultation bilaterally. No wheezes, rales, or rhonchi. No distress.  Gastrointestinal:  +BS, soft, non-tender and non-distended. No HSM noted. No guarding or rebound. No masses appreciated.  Rectal:  Deferred  Musculoskalatal:  Symmetrical without gross deformities. Neurologic:  Alert and oriented x4;  grossly normal  neurologically. Psych:  Alert and cooperative. Normal mood and affect. Heme/Lymph/Immune: No excessive bruising noted.    08/13/2018 9:47 AM   Disclaimer: This note was dictated with voice recognition software. Similar sounding words can inadvertently be transcribed and may not be corrected upon review.

## 2018-08-13 NOTE — Progress Notes (Signed)
cc'ed to pcp °

## 2018-08-13 NOTE — Patient Instructions (Signed)
1. We will schedule your upper endoscopy for you. 2. Further recommendations will be made after your upper endoscopy. 3. Return for follow-up based on the recommendations made after your endoscopy. 4. Call us if you have any questions or concerns.  At [facility] we value your feedback. You may receive a survey about your visit today. Please share your experience as we strive to create trusting relationships with our patients to provide genuine, compassionate, quality care.  We appreciate your understanding and patience as we review any laboratory studies, imaging, and other diagnostic tests that are ordered as we care for you. Our office policy is 5 business days for review of these results, and any emergent or urgent results are addressed in a timely manner for your best interest. If you do not hear from our office in 1 week, please contact us.   We also encourage the use of MyChart, which contains your medical information for your review as well. If you are not enrolled in this feature, an access code is on this after visit summary for your convenience. Thank you for allowing [place] to be involved in your care.  It was great to meet you today!  Hang in there and I hope you have a great Fall!!

## 2018-08-13 NOTE — H&P (View-Only) (Signed)
Primary Care Physician:  Fayrene Helper, MD Primary Gastroenterologist:  Dr. Gala Romney  Chief Complaint  Patient presents with  . abnormal CT    HPI:   Allison Hart is a 57 y.o. female who presents on referral from primary care for abnormal CT of the abdomen demonstrating thickening of the gastric fundus.  Her son recently died at a young age (in his 42s) from pancreatic cancer.  Reviewed the most recent primary care office visit dated 07/30/2018 which were for cellulitis, hypertension, obesity.  Discussed the recent loss of her son and "dealing with it one day at a time."  Last colonoscopy that could be found in our system was dated 11/13/2012.  Findings include scattered diverticula, no evidence of polyps, small external hemorrhoids.  Recommended repeat exam in 10 years (2024).  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  Her vascular evaluation status post stenting was essentially normal.  However, at that time they noted suspected diffuse wall thickening in the gastric fundus and recommended upper GI endoscopy.  Today she states she's doing ok overall. Taking one day at a time. Denies significant abdominal pain (but hasn't been paying attention). Has been under a lot of stress. Was recently on an antibiotic for cellulitis and developed a rash (is now on her allergy list "sulfa antibiotics"). Denies N/V, GERD symptoms, hematochezia, melena, fever, chills, unintentional weight loss. Denies chest pain, dyspnea, dizziness, lightheadedness, syncope, near syncope. Denies any other upper or lower GI symptoms.  Past Medical History:  Diagnosis Date  . Anxiety   . Arthritis   . Essential hypertension, benign   . GERD (gastroesophageal reflux disease)   . Herpes   . Murmur   . Obesity   . Sinusitis   . Urinary frequency      Past Surgical History:  Procedure Laterality Date  . COLONOSCOPY  11/13/2012   Procedure: COLONOSCOPY;  Surgeon: Rogene Houston, MD;  Location: [facility];  Service: Endoscopy;  Laterality: N/A;  930  . embolization of uterine artery X 2    . INGUINAL LYMPH NODE BIOPSY Right 04/14/2016   Procedure: RIGHT INGUINAL LYMPH NODE BIOPSY;  Surgeon: Aviva Signs, MD;  Location: [facility];  Service: General;  Laterality: Right;  . IR ANGIO INTRA EXTRACRAN SEL COM CAROTID INNOMINATE BILAT MOD SED  02/12/2017  . IR ANGIO VERTEBRAL SEL VERTEBRAL BILAT MOD SED  02/12/2017  . IR GENERIC HISTORICAL  04/27/2016   IR RADIOLOGIST EVAL & MGMT 04/27/2016 Jacqulynn Cadet, MD [facility]  . IR GENERIC HISTORICAL  12/05/2016   IR RADIOLOGIST EVAL & MGMT 12/05/2016 Jacqulynn Cadet, MD [facility]  . IR RADIOLOGIST EVAL & MGMT  01/22/2017  . IR RADIOLOGIST EVAL & MGMT  08/06/2018  . Left inguinal hernia repair and endometriosis  2007   No definitive records   . Left parotid bx benign  2005  . RADIOLOGY WITH ANESTHESIA N/A 01/18/2016   Procedure: RADIOLOGY WITH ANESTHESIA;  Surgeon: Luanne Bras, MD;  Location: [place];  Service: Radiology;  Laterality: N/A;  . RADIOLOGY WITH ANESTHESIA N/A 02/12/2017   Procedure: Cherie Dark EMBOLIZATION;  Surgeon: Luanne Bras, MD;  Location: [place];  Service: Radiology;  Laterality: N/A;  . renal artery aneurysm repair right Right 01/2016  . UMBILICAL HERNIA REPAIR  infancy     Current Outpatient Medications  Medication  Sig Dispense Refill  . acyclovir (ZOVIRAX) 800 MG tablet Take 1 tablet (800 mg total) by mouth 5 (five) times daily. (Patient taking differently: Take 800 mg by mouth as needed. ) 50 tablet 1  . amLODipine (NORVASC) 10 MG tablet Take 1 tablet (10 mg total) by mouth daily. 90 tablet 3  . aspirin EC 81 MG tablet Take 1 tablet (81 mg total) by mouth daily. 100 tablet 2  . cholecalciferol (VITAMIN D) 1000 units tablet Take 1,000 Units by mouth daily.     . furosemide (LASIX) 40 MG tablet Take 1 tablet (40 mg total) by mouth daily. 90 tablet 3  . loratadine (CLARITIN) 10 MG tablet TAKE 1 TABLET BY MOUTH ONCE DAILY 90 tablet 3  . potassium chloride SA (K-DUR,KLOR-CON) 20 MEQ tablet Take 1 tablet (20 mEq total) by mouth 2 (two) times daily. 60 tablet 5  . Pyridoxine HCl (VITAMIN B-6 PO) Take 1 tablet by mouth daily.    Marland Kitchen spironolactone (ALDACTONE) 25 MG tablet Take 1 tablet (25 mg total) by mouth daily. 30 tablet 5   No current facility-administered medications for this visit.     Allergies as of 08/13/2018 - Review Complete 08/13/2018  Allergen Reaction Noted  . Versed [midazolam] Shortness Of Breath and Other (See Comments) 01/18/2016  . Sulfa antibiotics  08/02/2018    Family History  Problem Relation Age of Onset  . Hypertension Mother   . Hyperlipidemia Mother   . Thyroid disease Mother   . Coronary artery disease Mother   . Irregular heart beat Mother   . Fibroids Sister   . Hypertension Sister   . Ulcerative colitis Son   . Colon cancer Neg Hx     Social History   Socioeconomic History  . Marital status: Married    Spouse name: Not on file  . Number of children: Not on file  . Years of education: Not on file  . Highest education level: Not on file  Occupational History  . Not on file  Social Needs  . Financial resource strain: Not on file  . Food insecurity:    Worry: Not on file    Inability: Not on file  . Transportation needs:    Medical: Not on file    Non-medical: Not on file  Tobacco Use  . Smoking status: Former Smoker    Packs/day: 0.25    Years: 5.00    Pack years: 1.25    Types: Cigarettes    Last attempt to quit: 1997    Years since quitting: 22.8  . Smokeless tobacco: Never Used  Substance and Sexual Activity  . Alcohol use: Yes    Comment: rare  . Drug use: No  . Sexual activity: Not on file  Lifestyle  . Physical activity:    Days per week: Not on file    Minutes per session: Not on  file  . Stress: Not on file  Relationships  . Social connections:    Talks on phone: Not on file    Gets together: Not on file    Attends religious service: Not on file    Active member of club or organization: Not on file    Attends meetings of clubs or organizations: Not on file    Relationship status: Not on file  . Intimate partner violence:    Fear of current or ex partner: Not on file    Emotionally abused: Not on file    Physically abused: Not on file  Forced sexual activity: Not on file  Other Topics Concern  . Not on file  Social History Narrative  . Not on file    Review of Systems: General: Negative for anorexia, weight loss, fever, chills, fatigue, weakness. ENT: Negative for hoarseness, difficulty swallowing. CV: Negative for chest pain, angina, palpitations, peripheral edema.  Respiratory: Negative for dyspnea at rest, cough, sputum, wheezing.  GI: See history of present illness. MS: Negative for joint pain, low back pain.  Derm: Negative for rash or itching.  Endo: Negative for unusual weight change.  Heme: Negative for bruising or bleeding. Allergy: Negative for rash or hives.    Physical Exam: BP (!) 138/93   Pulse 76   Temp 98.8 F (37.1 C) (Oral)   Ht 5\' 9"  (1.753 m)   Wt 215 lb 3.2 oz (97.6 kg)   BMI 31.78 kg/m  General:   Alert and oriented. Pleasant and cooperative. Well-nourished and well-developed.  Eyes:  Without icterus, sclera clear and conjunctiva pink.  Ears:  Normal auditory acuity. Cardiovascular:  S1, S2 present without murmurs appreciated. Extremities without clubbing or edema. Respiratory:  Clear to auscultation bilaterally. No wheezes, rales, or rhonchi. No distress.  Gastrointestinal:  +BS, soft, non-tender and non-distended. No HSM noted. No guarding or rebound. No masses appreciated.  Rectal:  Deferred  Musculoskalatal:  Symmetrical without gross deformities. Neurologic:  Alert and oriented x4;  grossly normal  neurologically. Psych:  Alert and cooperative. Normal mood and affect. Heme/Lymph/Immune: No excessive bruising noted.    08/13/2018 9:47 AM   Disclaimer: This note was dictated with voice recognition software. Similar sounding words can inadvertently be transcribed and may not be corrected upon review.

## 2018-08-13 NOTE — Assessment & Plan Note (Signed)
The patient was referred to our practice for ASAP visit due to CT incidental finding of suspected diffuse wall thickening of the gastric fundus with recommended endoscopic evaluation.  Today she is generally asymptomatic from a GI standpoint.  She has been under a lot of stress lately as her son just recently passed from pancreatic cancer at a young age (in his 24s).  She denies ongoing abdominal pain, nausea, vomiting.  No melena.  At this point we will proceed with EGD to further evaluate.  Follow-up based on recommendations made after her procedure.  Proceed with EGD with Dr. Gala Romney in near future: the risks, benefits, and alternatives have been discussed with the patient in detail. The patient states understanding and desires to proceed.  The patient is not on any anticoagulants, anxiolytics, chronic pain medications, or antidepressants.  Conscious sedation should be adequate for her procedure.

## 2018-08-27 ENCOUNTER — Other Ambulatory Visit: Payer: Self-pay | Admitting: *Deleted

## 2018-08-27 ENCOUNTER — Telehealth: Payer: Self-pay | Admitting: General Practice

## 2018-08-27 DIAGNOSIS — R935 Abnormal findings on diagnostic imaging of other abdominal regions, including retroperitoneum: Secondary | ICD-10-CM

## 2018-08-27 NOTE — Telephone Encounter (Signed)
The patient is aware to be at [facility] department tomorrow, 11/20 at 1:15 pm.  She is also aware her new procedure date is 11/21.   We discussed her instruction via telephone.

## 2018-08-27 NOTE — Telephone Encounter (Signed)
We received a call from endoscopy nurse at [facility] stating the patient has a Versed allergy.  Almyra Free spoke with the Dr. Gala Romney and he stated that the patient needed to be done with Propofol.  I tried to call the patient, no answer, lmom

## 2018-08-27 NOTE — Patient Instructions (Signed)
Allison Hart  08/27/2018     @PREFPERIOPPHARMACY @   Your procedure is scheduled on   08/29/2018  Report to [facility] at  1330   P.M.  Call this number if you have problems the morning of surgery:  [phone]   Remember:  Follow the diet instructions given to you by Dr Roseanne Kaufman office.                      Take these medicines the morning of surgery with A SIP OF WATER  Amlodipine, claritin    Do not wear jewelry, make-up or nail polish.  Do not wear lotions, powders, or perfumes, or deodorant.  Do not shave 48 hours prior to surgery.  Men may shave face and neck.  Do not bring valuables to the hospital.  [facility] is not responsible for any belongings or valuables.  Contacts, dentures or bridgework may not be worn into surgery.  Leave your suitcase in the car.  After surgery it may be brought to your room.  For patients admitted to the hospital, discharge time will be determined by your treatment team.  Patients discharged the day of surgery will not be allowed to drive home.   Name and phone number of your driver:   family Special instructions:  None  Please read over the following fact sheets that you were given. Anesthesia Post-op Instructions and Care and Recovery After Surgery       Esophagogastroduodenoscopy Esophagogastroduodenoscopy (EGD) is a procedure to examine the lining of the esophagus, stomach, and first part of the small intestine (duodenum). This procedure is done to check for problems such as inflammation, bleeding, ulcers, or growths. During this procedure, a long, flexible, lighted tube with a camera attached (endoscope) is inserted down the throat. Tell a health care provider about:  Any allergies you have.  All medicines you are taking, including vitamins, herbs, eye drops, creams, and over-the-counter medicines.  Any problems you or family members have had with anesthetic medicines.  Any blood disorders you  have.  Any surgeries you have had.  Any medical conditions you have.  Whether you are pregnant or may be pregnant. What are the risks? Generally, this is a safe procedure. However, problems may occur, including:  Infection.  Bleeding.  A tear (perforation) in the esophagus, stomach, or duodenum.  Trouble breathing.  Excessive sweating.  Spasms of the larynx.  A slowed heartbeat.  Low blood pressure.  What happens before the procedure?  Follow instructions from your health care provider about eating or drinking restrictions.  Ask your health care provider about: ? Changing or stopping your regular medicines. This is especially important if you are taking diabetes medicines or blood thinners. ? Taking medicines such as aspirin and ibuprofen. These medicines can thin your blood. Do not take these medicines before your procedure if your health care provider instructs you not to.  Plan to have someone take you home after the procedure.  If you wear dentures, be ready to remove them before the procedure. What happens during the procedure?  To reduce your risk of infection, your health care team will wash or sanitize their hands.  An IV tube will be put in a vein in your hand or arm. You will get medicines and fluids through this tube.  You will be given one or more of the following: ? A medicine to help you relax (sedative). ? A  medicine to numb the area (local anesthetic). This medicine may be sprayed into your throat. It will make you feel more comfortable and keep you from gagging or coughing during the procedure. ? A medicine for pain.  A mouth guard may be placed in your mouth to protect your teeth and to keep you from biting on the endoscope.  You will be asked to lie on your left side.  The endoscope will be lowered down your throat into your esophagus, stomach, and duodenum.  Air will be put into the endoscope. This will help your health care provider see  better.  The lining of your esophagus, stomach, and duodenum will be examined.  Your health care provider may: ? Take a tissue sample so it can be looked at in a lab (biopsy). ? Remove growths. ? Remove objects (foreign bodies) that are stuck. ? Treat any bleeding with medicines or other devices that stop tissue from bleeding. ? Widen (dilate) or stretch narrowed areas of your esophagus and stomach.  The endoscope will be taken out. The procedure may vary among health care providers and hospitals. What happens after the procedure?  Your blood pressure, heart rate, breathing rate, and blood oxygen level will be monitored often until the medicines you were given have worn off.  Do not eat or drink anything until the numbing medicine has worn off and your gag reflex has returned. This information is not intended to replace advice given to you by your health care provider. Make sure you discuss any questions you have with your health care provider. Document Released: 01/26/2005 Document Revised: 03/02/2016 Document Reviewed: 08/19/2015 Elsevier Interactive Patient Education  2018 [employer]. Esophagogastroduodenoscopy, Care After Refer to this sheet in the next few weeks. These instructions provide you with information about caring for yourself after your procedure. Your health care provider may also give you more specific instructions. Your treatment has been planned according to current medical practices, but problems sometimes occur. Call your health care provider if you have any problems or questions after your procedure. What can I expect after the procedure? After the procedure, it is common to have:  A sore throat.  Nausea.  Bloating.  Dizziness.  Fatigue.  Follow these instructions at home:  Do not eat or drink anything until the numbing medicine (local anesthetic) has worn off and your gag reflex has returned. You will know that the local anesthetic has worn off when you  can swallow comfortably.  Do not drive for 24 hours if you received a medicine to help you relax (sedative).  If your health care provider took a tissue sample for testing during the procedure, make sure to get your test results. This is your responsibility. Ask your health care provider or the department performing the test when your results will be ready.  Keep all follow-up visits as told by your health care provider. This is important. Contact a health care provider if:  You cannot stop coughing.  You are not urinating.  You are urinating less than usual. Get help right away if:  You have trouble swallowing.  You cannot eat or drink.  You have throat or chest pain that gets worse.  You are dizzy or light-headed.  You faint.  You have nausea or vomiting.  You have chills.  You have a fever.  You have severe abdominal pain.  You have black, tarry, or bloody stools. This information is not intended to replace advice given to you by your health care  provider. Make sure you discuss any questions you have with your health care provider. Document Released: 09/11/2012 Document Revised: 03/02/2016 Document Reviewed: 08/19/2015 Elsevier Interactive Patient Education  2018 [place] Anesthesia is a term that refers to techniques, procedures, and medicines that help a person stay safe and comfortable during a medical procedure. Monitored anesthesia care, or sedation, is one type of anesthesia. Your anesthesia specialist may recommend sedation if you will be having a procedure that does not require you to be unconscious, such as:  Cataract surgery.  A dental procedure.  A biopsy.  A colonoscopy.  During the procedure, you may receive a medicine to help you relax (sedative). There are three levels of sedation:  Mild sedation. At this level, you may feel awake and relaxed. You will be able to follow directions.  Moderate sedation. At this  level, you will be sleepy. You may not remember the procedure.  Deep sedation. At this level, you will be asleep. You will not remember the procedure.  The more medicine you are given, the deeper your level of sedation will be. Depending on how you respond to the procedure, the anesthesia specialist may change your level of sedation or the type of anesthesia to fit your needs. An anesthesia specialist will monitor you closely during the procedure. Let your health care provider know about:  Any allergies you have.  All medicines you are taking, including vitamins, herbs, eye drops, creams, and over-the-counter medicines.  Any use of steroids (by mouth or as a cream).  Any problems you or family members have had with sedatives and anesthetic medicines.  Any blood disorders you have.  Any surgeries you have had.  Any medical conditions you have, such as sleep apnea.  Whether you are pregnant or may be pregnant.  Any use of cigarettes, alcohol, or street drugs. What are the risks? Generally, this is a safe procedure. However, problems may occur, including:  Getting too much medicine (oversedation).  Nausea.  Allergic reaction to medicines.  Trouble breathing. If this happens, a breathing tube may be used to help with breathing. It will be removed when you are awake and breathing on your own.  Heart trouble.  Lung trouble.  Before the procedure Staying hydrated Follow instructions from your health care provider about hydration, which may include:  Up to 2 hours before the procedure - you may continue to drink clear liquids, such as water, clear fruit juice, black coffee, and plain tea.  Eating and drinking restrictions Follow instructions from your health care provider about eating and drinking, which may include:  8 hours before the procedure - stop eating heavy meals or foods such as meat, fried foods, or fatty foods.  6 hours before the procedure - stop eating light  meals or foods, such as toast or cereal.  6 hours before the procedure - stop drinking milk or drinks that contain milk.  2 hours before the procedure - stop drinking clear liquids.  Medicines Ask your health care provider about:  Changing or stopping your regular medicines. This is especially important if you are taking diabetes medicines or blood thinners.  Taking medicines such as aspirin and ibuprofen. These medicines can thin your blood. Do not take these medicines before your procedure if your health care provider instructs you not to.  Tests and exams  You will have a physical exam.  You may have blood tests done to show: ? How well your kidneys and liver are working. ?  How well your blood can clot.  General instructions  Plan to have someone take you home from the hospital or clinic.  If you will be going home right after the procedure, plan to have someone with you for 24 hours.  What happens during the procedure?  Your blood pressure, heart rate, breathing, level of pain and overall condition will be monitored.  An IV tube will be inserted into one of your veins.  Your anesthesia specialist will give you medicines as needed to keep you comfortable during the procedure. This may mean changing the level of sedation.  The procedure will be performed. After the procedure  Your blood pressure, heart rate, breathing rate, and blood oxygen level will be monitored until the medicines you were given have worn off.  Do not drive for 24 hours if you received a sedative.  You may: ? Feel sleepy, clumsy, or nauseous. ? Feel forgetful about what happened after the procedure. ? Have a sore throat if you had a breathing tube during the procedure. ? Vomit. This information is not intended to replace advice given to you by your health care provider. Make sure you discuss any questions you have with your health care provider. Document Released: 06/21/2005 Document Revised:  03/03/2016 Document Reviewed: 01/16/2016 Elsevier Interactive Patient Education  2018 [place], Care After These instructions provide you with information about caring for yourself after your procedure. Your health care provider may also give you more specific instructions. Your treatment has been planned according to current medical practices, but problems sometimes occur. Call your health care provider if you have any problems or questions after your procedure. What can I expect after the procedure? After your procedure, it is common to:  Feel sleepy for several hours.  Feel clumsy and have poor balance for several hours.  Feel forgetful about what happened after the procedure.  Have poor judgment for several hours.  Feel nauseous or vomit.  Have a sore throat if you had a breathing tube during the procedure.  Follow these instructions at home: For at least 24 hours after the procedure:   Do not: ? Participate in activities in which you could fall or become injured. ? Drive. ? Use heavy machinery. ? Drink alcohol. ? Take sleeping pills or medicines that cause drowsiness. ? Make important decisions or sign legal documents. ? Take care of children on your own.  Rest. Eating and drinking  Follow the diet that is recommended by your health care provider.  If you vomit, drink water, juice, or soup when you can drink without vomiting.  Make sure you have little or no nausea before eating solid foods. General instructions  Have a responsible adult stay with you until you are awake and alert.  Take over-the-counter and prescription medicines only as told by your health care provider.  If you smoke, do not smoke without supervision.  Keep all follow-up visits as told by your health care provider. This is important. Contact a health care provider if:  You keep feeling nauseous or you keep vomiting.  You feel light-headed.  You develop a  rash.  You have a fever. Get help right away if:  You have trouble breathing. This information is not intended to replace advice given to you by your health care provider. Make sure you discuss any questions you have with your health care provider. Document Released: 01/16/2016 Document Revised: 05/17/2016 Document Reviewed: 01/16/2016 Elsevier Interactive Patient Education  [employer].

## 2018-08-28 ENCOUNTER — Encounter (HOSPITAL_COMMUNITY): Admission: RE | Payer: Self-pay | Source: Ambulatory Visit

## 2018-08-28 ENCOUNTER — Encounter (HOSPITAL_COMMUNITY): Payer: Self-pay

## 2018-08-28 ENCOUNTER — Encounter (HOSPITAL_COMMUNITY)
Admission: RE | Admit: 2018-08-28 | Discharge: 2018-08-28 | Disposition: A | Discharge status: Home / Self Care | Payer: Self-pay | Source: Ambulatory Visit | Attending: Internal Medicine | Admitting: Internal Medicine

## 2018-08-28 ENCOUNTER — Ambulatory Visit (HOSPITAL_COMMUNITY): Admission: RE | Admit: 2018-08-28 | Payer: Self-pay | Source: Ambulatory Visit | Admitting: Internal Medicine

## 2018-08-28 ENCOUNTER — Other Ambulatory Visit: Payer: Self-pay

## 2018-08-28 DIAGNOSIS — Z01818 Encounter for other preprocedural examination: Secondary | ICD-10-CM | POA: Insufficient documentation

## 2018-08-28 HISTORY — DX: Personal history of urinary calculi: Z87.442

## 2018-08-28 LAB — PREGNANCY, URINE: Preg Test, Ur: NEGATIVE

## 2018-08-28 SURGERY — EGD (ESOPHAGOGASTRODUODENOSCOPY)
Anesthesia: Moderate Sedation

## 2018-08-29 ENCOUNTER — Ambulatory Visit (HOSPITAL_COMMUNITY): Payer: Self-pay | Admitting: Anesthesiology

## 2018-08-29 ENCOUNTER — Encounter (HOSPITAL_COMMUNITY)
Admission: RE | Disposition: A | Discharge status: Home / Self Care | Payer: Self-pay | Source: Ambulatory Visit | Attending: Internal Medicine

## 2018-08-29 ENCOUNTER — Ambulatory Visit (HOSPITAL_COMMUNITY)
Admission: RE | Admit: 2018-08-29 | Discharge: 2018-08-29 | Disposition: A | Discharge status: Home / Self Care | Payer: Self-pay | Source: Ambulatory Visit | Attending: Internal Medicine | Admitting: Internal Medicine

## 2018-08-29 ENCOUNTER — Telehealth: Payer: Self-pay | Admitting: *Deleted

## 2018-08-29 ENCOUNTER — Other Ambulatory Visit: Payer: Self-pay

## 2018-08-29 ENCOUNTER — Encounter (HOSPITAL_COMMUNITY): Payer: Self-pay | Admitting: *Deleted

## 2018-08-29 DIAGNOSIS — E669 Obesity, unspecified: Secondary | ICD-10-CM | POA: Insufficient documentation

## 2018-08-29 DIAGNOSIS — R935 Abnormal findings on diagnostic imaging of other abdominal regions, including retroperitoneum: Secondary | ICD-10-CM

## 2018-08-29 DIAGNOSIS — R933 Abnormal findings on diagnostic imaging of other parts of digestive tract: Secondary | ICD-10-CM

## 2018-08-29 DIAGNOSIS — K219 Gastro-esophageal reflux disease without esophagitis: Secondary | ICD-10-CM | POA: Insufficient documentation

## 2018-08-29 DIAGNOSIS — M1990 Unspecified osteoarthritis, unspecified site: Secondary | ICD-10-CM | POA: Insufficient documentation

## 2018-08-29 DIAGNOSIS — R599 Enlarged lymph nodes, unspecified: Secondary | ICD-10-CM

## 2018-08-29 DIAGNOSIS — I722 Aneurysm of renal artery: Secondary | ICD-10-CM | POA: Insufficient documentation

## 2018-08-29 DIAGNOSIS — I10 Essential (primary) hypertension: Secondary | ICD-10-CM | POA: Insufficient documentation

## 2018-08-29 DIAGNOSIS — Z87891 Personal history of nicotine dependence: Secondary | ICD-10-CM | POA: Insufficient documentation

## 2018-08-29 DIAGNOSIS — K229 Disease of esophagus, unspecified: Secondary | ICD-10-CM

## 2018-08-29 DIAGNOSIS — Z7982 Long term (current) use of aspirin: Secondary | ICD-10-CM | POA: Insufficient documentation

## 2018-08-29 DIAGNOSIS — F419 Anxiety disorder, unspecified: Secondary | ICD-10-CM | POA: Insufficient documentation

## 2018-08-29 DIAGNOSIS — Z79899 Other long term (current) drug therapy: Secondary | ICD-10-CM | POA: Insufficient documentation

## 2018-08-29 DIAGNOSIS — K2950 Unspecified chronic gastritis without bleeding: Secondary | ICD-10-CM | POA: Insufficient documentation

## 2018-08-29 DIAGNOSIS — Z6831 Body mass index (BMI) 31.0-31.9, adult: Secondary | ICD-10-CM | POA: Insufficient documentation

## 2018-08-29 DIAGNOSIS — K317 Polyp of stomach and duodenum: Secondary | ICD-10-CM | POA: Insufficient documentation

## 2018-08-29 HISTORY — PX: BIOPSY: SHX5522

## 2018-08-29 HISTORY — PX: ESOPHAGOGASTRODUODENOSCOPY (EGD) WITH PROPOFOL: SHX5813

## 2018-08-29 SURGERY — ESOPHAGOGASTRODUODENOSCOPY (EGD) WITH PROPOFOL
Anesthesia: General

## 2018-08-29 MED ORDER — HYDROMORPHONE HCL 1 MG/ML IJ SOLN: 0.2500 mg | INTRAMUSCULAR | Status: DC | PRN

## 2018-08-29 MED ORDER — CHLORHEXIDINE GLUCONATE CLOTH 2 % EX PADS: 6.0000 | MEDICATED_PAD | Freq: Once | CUTANEOUS | Status: DC

## 2018-08-29 MED ORDER — MIDAZOLAM HCL 2 MG/2ML IJ SOLN: 0.5000 mg | Freq: Once | INTRAMUSCULAR | Status: DC | PRN

## 2018-08-29 MED ORDER — PROMETHAZINE HCL 25 MG/ML IJ SOLN: 6.2500 mg | INTRAMUSCULAR | Status: DC | PRN

## 2018-08-29 MED ORDER — LACTATED RINGERS IV SOLN
INTRAVENOUS | Status: DC
  Administered 2018-08-29: 15:00:00 via INTRAVENOUS

## 2018-08-29 MED ORDER — SODIUM CHLORIDE 0.9 % IV SOLN: INTRAVENOUS | Status: DC

## 2018-08-29 MED ORDER — PROPOFOL 500 MG/50ML IV EMUL
INTRAVENOUS | Status: DC | PRN
  Administered 2018-08-29: 150 ug/kg/min via INTRAVENOUS

## 2018-08-29 MED ORDER — PROPOFOL 10 MG/ML IV BOLUS
INTRAVENOUS | Status: DC | PRN
  Administered 2018-08-29 (×3): 20 mg via INTRAVENOUS
  Administered 2018-08-29: 10 mg via INTRAVENOUS

## 2018-08-29 MED ORDER — HYDROCODONE-ACETAMINOPHEN 7.5-325 MG PO TABS: 1.0000 | ORAL_TABLET | Freq: Once | ORAL | Status: DC | PRN

## 2018-08-29 NOTE — Anesthesia Postprocedure Evaluation (Signed)
Anesthesia Post Note  Patient: Allison Hart  Procedure(s) Performed: ESOPHAGOGASTRODUODENOSCOPY (EGD) WITH PROPOFOL (N/A ) BIOPSY  Patient location during evaluation: PACU Anesthesia Type: General Level of consciousness: awake and alert and oriented Pain management: pain level controlled Vital Signs Assessment: post-procedure vital signs reviewed and stable Cardiovascular status: blood pressure returned to baseline Postop Assessment: no apparent nausea or vomiting Anesthetic complications: no     Last Vitals:  Vitals:   08/29/18 1512 08/29/18 1515  BP: 113/78 113/78  Pulse: 71 73  Resp: 16 20  Temp: 36.4 C   SpO2: 95% 95%    Last Pain:  Vitals:   08/29/18 1515  TempSrc:   PainSc: 0-No pain                 Reshawn Ostlund

## 2018-08-29 NOTE — Telephone Encounter (Signed)
Received call from [place] in Endo. Patient needs CT Chest w/ contrast to f/u previously enlarged lymph nodes, abn esophagus.  Order has been entered. Will schedule once patient is d/c'd.

## 2018-08-29 NOTE — Progress Notes (Signed)
CC'D TO PCP °

## 2018-08-29 NOTE — Transfer of Care (Signed)
Immediate Anesthesia Transfer of Care Note  Patient: Allison Hart  Procedure(s) Performed: ESOPHAGOGASTRODUODENOSCOPY (EGD) WITH PROPOFOL (N/A ) BIOPSY  Patient Location: PACU  Anesthesia Type:General  Level of Consciousness: awake, alert  and oriented  Airway & Oxygen Therapy: Patient Spontanous Breathing  Post-op Assessment: Report given to RN  Post vital signs: Reviewed  Last Vitals:  Vitals Value Taken Time  BP 113/78 08/29/2018  3:12 PM  Temp 36.4 C 08/29/2018  3:12 PM  Pulse 73 08/29/2018  3:14 PM  Resp 20 08/29/2018  3:14 PM  SpO2 95 % 08/29/2018  3:14 PM  Vitals shown include unvalidated device data.  Last Pain:  Vitals:   08/29/18 1338  TempSrc: Oral  PainSc: 0-No pain      Patients Stated Pain Goal: 6 (38/25/05 3976)  Complications: No apparent anesthesia complications

## 2018-08-29 NOTE — Op Note (Addendum)
[facility] Patient Name: Allison Hart Procedure Date: 08/29/2018 2:28 PM MRN: [id number] Date of Birth: 1961-06-19 Attending MD: Norvel Richards , MD CSN: [id number] Age: 57 Admit Type: Outpatient Procedure:                Upper GI endoscopy Indications:              Abnormal CT of the GI tract Providers:                Norvel Richards, MD, Lurline Del, RN, Randa Spike, Technician Referring MD:             Norwood Levo. Simpson MD, MD Medicines:                Propofol per Anesthesia Complications:            No immediate complications. Estimated Blood Loss:     Estimated blood loss was minimal. Procedure:                After obtaining informed consent, the endoscope was                            passed under direct vision. Throughout the                            procedure, the patient's blood pressure, pulse, and                            oxygen saturations were monitored continuously. The                            [id number]-[id number] ([id number]) was introduced through the and                            advanced to the second part of duodenum. Scope In: 2:48:30 PM Scope Out: 3:03:46 PM Total Procedure Duration: 0 hours 15 minutes 16 seconds  Findings:      3 x 3 cm area of significant extrinsic compression on the esophageal       lumen mid body. Pulsatile component. Slightly denuded appearing mucosal       surface over this lesion what appeared to be a superficial vascular       bleb. Please see photos. Otherwise, the tubular esophagus appeared       normal.      Abnormal gastric mucosa thickened nodular esophageal mucosa extending       from the superior aspect of the angularis down to the superior aspect of       the pyloric channel. There was some central area of ulceration more       distally. Please see photographs.      Nodular area in stomach biopsied with jumbo biopsy forceps. Eroded       mucosa elsewhere biopsied with regular cold  biopsy forceps. Because of       the pulsatile nature of the esophageal lesion it was not manipulated. Impression:               -Extrinsic mass-effect on mid esophagus of  uncertain significance. Pulsatile Component. No                            Biopsies taken. Abnormal gastric mucosa as                            described above?"status post biopsy.                           In reviewing the record, back in 2017, patient was                            seen by oncology for intrathoracic adenopathy. She                            had significant adenopathy. Follow-up chest CT was                            recommended but apparently this was not done. Moderate Sedation:      Moderate (conscious) sedation was personally administered by an       anesthesia professional. The following parameters were monitored: oxygen       saturation, heart rate, blood pressure, respiratory rate, EKG, adequacy       of pulmonary ventilation, and response to care. Recommendation:           - Patient has a contact number available for                            emergencies. The signs and symptoms of potential                            delayed complications were discussed with the                            patient. Return to normal activities tomorrow.                            Written discharge instructions were provided to the                            patient.                           - Advance diet as tolerated.                           - Continue present medications.                           - Await pathology results.                           - Repeat upper endoscopy at appointment to be                            scheduled for surveillance.                           -  Return to GI clinic (date not yet determined).                            Follow-up on biopsies. Repeat chest CT scan ASAP. Procedure Code(s):        --- Professional ---                           [phone],  Esophagogastroduodenoscopy, flexible,                            transoral; diagnostic, including collection of                            specimen(s) by brushing or washing, when performed                            (separate procedure) Diagnosis Code(s):        --- Professional ---                           R93.3, Abnormal findings on diagnostic imaging of                            other parts of digestive tract CPT copyright 2018 American Medical Association. All rights reserved. The codes documented in this report are preliminary and upon coder review may  be revised to meet current compliance requirements. Cristopher Estimable. Rourk, MD Norvel Richards, MD 08/29/2018 3:39:53 PM This report has been signed electronically. Number of Addenda: 0

## 2018-08-29 NOTE — Discharge Instructions (Signed)
EGD Discharge instructions Please read the instructions outlined below and refer to this sheet in the next few weeks. These discharge instructions provide you with general information on caring for yourself after you leave the hospital. Your doctor may also give you specific instructions. While your treatment has been planned according to the most current medical practices available, unavoidable complications occasionally occur. If you have any problems or questions after discharge, please call your doctor. ACTIVITY  You may resume your regular activity but move at a slower pace for the next 24 hours.   Take frequent rest periods for the next 24 hours.   Walking will help expel (get rid of) the air and reduce the bloated feeling in your abdomen.   No driving for 24 hours (because of the anesthesia (medicine) used during the test).   You may shower.   Do not sign any important legal documents or operate any machinery for 24 hours (because of the anesthesia used during the test).  NUTRITION  Drink plenty of fluids.   You may resume your normal diet.   Begin with a light meal and progress to your normal diet.   Avoid alcoholic beverages for 24 hours or as instructed by your caregiver.  MEDICATIONS  You may resume your normal medications unless your caregiver tells you otherwise.  WHAT YOU CAN EXPECT TODAY  You may experience abdominal discomfort such as a feeling of fullness or gas pains.  FOLLOW-UP  Your doctor will discuss the results of your test with you.  SEEK IMMEDIATE MEDICAL ATTENTION IF ANY OF THE FOLLOWING OCCUR:  Excessive nausea (feeling sick to your stomach) and/or vomiting.   Severe abdominal pain and distention (swelling).   Trouble swallowing.   Temperature over 101 F (37.8 C).   Rectal bleeding or vomiting of blood.    Contrast CT of the chest to follow-up on previously noted enlarged lymph nodes and abnormal esophagus today  (**Mindy at Dr. Roseanne Kaufman  office notified of request)  Further recommendations to follow pending review of pathology report      Monitored Anesthesia Care, Care After These instructions provide you with information about caring for yourself after your procedure. Your health care provider may also give you more specific instructions. Your treatment has been planned according to current medical practices, but problems sometimes occur. Call your health care provider if you have any problems or questions after your procedure. What can I expect after the procedure? After your procedure, it is common to:  Feel sleepy for several hours.  Feel clumsy and have poor balance for several hours.  Feel forgetful about what happened after the procedure.  Have poor judgment for several hours.  Feel nauseous or vomit.  Have a sore throat if you had a breathing tube during the procedure.  Follow these instructions at home: For at least 24 hours after the procedure:   Do not: ? Participate in activities in which you could fall or become injured. ? Drive. ? Use heavy machinery. ? Drink alcohol. ? Take sleeping pills or medicines that cause drowsiness. ? Make important decisions or sign legal documents. ? Take care of children on your own.  Rest. Eating and drinking  Follow the diet that is recommended by your health care provider.  If you vomit, drink water, juice, or soup when you can drink without vomiting.  Make sure you have little or no nausea before eating solid foods. General instructions  Have a responsible adult stay with you until you are  awake and alert.  Take over-the-counter and prescription medicines only as told by your health care provider.  If you smoke, do not smoke without supervision.  Keep all follow-up visits as told by your health care provider. This is important. Contact a health care provider if:  You keep feeling nauseous or you keep vomiting.  You feel light-headed.  You  develop a rash.  You have a fever. Get help right away if:  You have trouble breathing. This information is not intended to replace advice given to you by your health care provider. Make sure you discuss any questions you have with your health care provider. Document Released: 01/16/2016 Document Revised: 05/17/2016 Document Reviewed: 01/16/2016 Elsevier Interactive Patient Education  [employer].

## 2018-08-29 NOTE — Anesthesia Preprocedure Evaluation (Addendum)
Anesthesia Evaluation  Patient identified by MRN, date of birth, ID band Patient awake    Reviewed: Allergy & Precautions, NPO status , Patient's Chart, lab work & pertinent test results  Airway Mallampati: I  TM Distance: >3 FB Neck ROM: Full    Dental no notable dental hx. (+) Teeth Intact   Pulmonary neg pulmonary ROS, former smoker,    Pulmonary exam normal breath sounds clear to auscultation       Cardiovascular Exercise Tolerance: Good hypertension, Pt. on medications negative cardio ROS Normal cardiovascular examI Rhythm:Regular Rate:Normal     Neuro/Psych Anxiety  Neuromuscular disease negative psych ROS   GI/Hepatic Neg liver ROS, GERD  Medicated and Controlled,EGD for abnormal CT   Endo/Other  negative endocrine ROS  Renal/GU negative Renal ROSH/o Renal A. Stent in 2017 -found spuriously -no recent issues   negative genitourinary   Musculoskeletal  (+) Arthritis , Osteoarthritis,    Abdominal   Peds negative pediatric ROS (+)  Hematology negative hematology ROS (+)   Anesthesia Other Findings   Reproductive/Obstetrics negative OB ROS                            Anesthesia Physical Anesthesia Plan  ASA: II  Anesthesia Plan: General   Post-op Pain Management:    Induction: Intravenous  PONV Risk Score and Plan:   Airway Management Planned: Nasal Cannula and Simple Face Mask  Additional Equipment:   Intra-op Plan:   Post-operative Plan:   Informed Consent: I have reviewed the patients History and Physical, chart, labs and discussed the procedure including the risks, benefits and alternatives for the proposed anesthesia with the patient or authorized representative who has indicated his/her understanding and acceptance.   Dental advisory given  Plan Discussed with: CRNA  Anesthesia Plan Comments:         Anesthesia Quick Evaluation

## 2018-08-29 NOTE — Interval H&P Note (Signed)
History and Physical Interval Note:  08/29/2018 2:29 PM  Allison Hart  has presented today for surgery, with the diagnosis of abdnormal CT  The various methods of treatment have been discussed with the patient and family. After consideration of risks, benefits and other options for treatment, the patient has consented to  Procedure(s) with comments: ESOPHAGOGASTRODUODENOSCOPY (EGD) WITH PROPOFOL (N/A) - 3:00pm as a surgical intervention .  The patient's history has been reviewed, patient examined, no change in status, stable for surgery.  I have reviewed the patient's chart and labs.  Questions were answered to the patient's satisfaction.      No upper GI tract symptoms whatsoever.  Diagnostic EGD per plan. The risks, benefits, limitations, alternatives and imponderables have been reviewed with the patient. Potential for esophageal dilation, biopsy, etc. have also been reviewed.  Questions have been answered. All parties agreeable.  Manus Rudd

## 2018-08-30 NOTE — Telephone Encounter (Signed)
CT scheduled for 12/3 at 10:30am, arrival time 10:15am, liquids only 4 hrs prior to test. Date okay per RMR.  Called made patient aware of appt details. She voiced understanding.

## 2018-08-30 NOTE — Addendum Note (Signed)
Addendum  created 08/30/18 1129 by Lenice Llamas, MD   Attestation recorded in [place], [place] filed

## 2018-09-03 ENCOUNTER — Telehealth: Payer: Self-pay

## 2018-09-03 ENCOUNTER — Encounter: Payer: Self-pay | Admitting: Internal Medicine

## 2018-09-03 NOTE — Telephone Encounter (Signed)
Noted. Pt aware of results and letter mailed to pt.

## 2018-09-03 NOTE — Telephone Encounter (Signed)
Per RMR- Send letter to patient.  Send copy of letter with path to referring provider and PCP.   Please call with results in addition to the letter. Should Be getting a chest CT soon.

## 2018-09-03 NOTE — Telephone Encounter (Signed)
cc'ed to pcp °

## 2018-09-04 ENCOUNTER — Encounter (HOSPITAL_COMMUNITY): Payer: Self-pay | Admitting: Internal Medicine

## 2018-09-10 ENCOUNTER — Ambulatory Visit (HOSPITAL_COMMUNITY)
Admission: RE | Admit: 2018-09-10 | Discharge: 2018-09-10 | Disposition: A | Discharge status: Home / Self Care | Payer: Self-pay | Source: Ambulatory Visit | Attending: Internal Medicine | Admitting: Internal Medicine

## 2018-09-10 DIAGNOSIS — R599 Enlarged lymph nodes, unspecified: Secondary | ICD-10-CM

## 2018-09-10 DIAGNOSIS — K229 Disease of esophagus, unspecified: Secondary | ICD-10-CM

## 2018-09-10 MED ORDER — IOHEXOL 300 MG/ML  SOLN
75.0000 mL | Freq: Once | INTRAMUSCULAR | Status: AC | PRN
  Administered 2018-09-10: 75 mL via INTRAVENOUS

## 2018-09-13 ENCOUNTER — Encounter: Payer: Self-pay | Admitting: Family Medicine

## 2018-11-06 ENCOUNTER — Ambulatory Visit: Payer: Self-pay | Admitting: Nurse Practitioner

## 2018-12-03 ENCOUNTER — Encounter: Payer: Self-pay | Admitting: Family Medicine

## 2018-12-03 ENCOUNTER — Telehealth: Payer: Self-pay

## 2018-12-03 NOTE — Telephone Encounter (Signed)
pls follow through as we discussed with direct call to a patient who stated that in the past she had used a  "christian Counsellor " in [place], if that does not work unless you can access a list of "Christian therapists" unfortunately I have no specific names to provide and would encourage her to see a Psychologist, sport and exercise and use her spiritual leader/ Doristine Bosworth , for the Religious/ christian aspect of dealing with grief

## 2018-12-03 NOTE — Telephone Encounter (Signed)
When I called pt back to let her know about Allison Hart counseling- she declined. She doesn't want a group, she said she needs intensive counseling with a therapist, preferably a christian therapist. (states she does not have insurance) but she desperately needs it. Currently trying to get medicaid for her husband because of his dementia and other health issues. Has a lot on her place.

## 2018-12-03 NOTE — Telephone Encounter (Signed)
Direct call made to patient to ask what center she attended. No answer, left voicemail

## 2018-12-09 ENCOUNTER — Other Ambulatory Visit: Payer: Self-pay | Admitting: Family Medicine

## 2018-12-10 ENCOUNTER — Other Ambulatory Visit: Payer: Self-pay | Admitting: Family Medicine

## 2018-12-11 NOTE — Telephone Encounter (Signed)
Have been communicating with pt via mychart. See mychart correspondence

## 2018-12-18 ENCOUNTER — Other Ambulatory Visit: Payer: Self-pay | Admitting: Family Medicine

## 2018-12-25 ENCOUNTER — Telehealth: Payer: Self-pay | Admitting: Family

## 2018-12-25 DIAGNOSIS — B9789 Other viral agents as the cause of diseases classified elsewhere: Principal | ICD-10-CM

## 2018-12-25 DIAGNOSIS — J069 Acute upper respiratory infection, unspecified: Secondary | ICD-10-CM

## 2018-12-25 MED ORDER — FLUTICASONE PROPIONATE 50 MCG/ACT NA SUSP: 2.0000 | Freq: Every day | NASAL | 6 refills | Status: DC

## 2018-12-25 MED ORDER — BENZONATATE 100 MG PO CAPS: 100.0000 mg | ORAL_CAPSULE | Freq: Three times a day (TID) | ORAL | 0 refills | Status: DC | PRN

## 2018-12-25 NOTE — Progress Notes (Signed)
We are sorry you are not feeling well.  Here is how we plan to help!  I called patient and discussed SOB and chest pain. States she has allergies and these are the same symptoms she gets every year. Not active SOB or chest pain at this time.   Approximately 5 minutes was spent documenting and reviewing patient's chart.     Based on what you have shared with me, it looks like you may have a viral upper respiratory infection.  Upper respiratory infections are caused by a large number of viruses; however, rhinovirus is the most common cause.   Symptoms vary from person to person, with common symptoms including sore throat, cough, and fatigue or lack of energy.  A low-grade fever of up to 100.4 may present, but is often uncommon.  Symptoms vary however, and are closely related to a person's age or underlying illnesses.  The most common symptoms associated with an upper respiratory infection are nasal discharge or congestion, cough, sneezing, headache and pressure in the ears and face.  These symptoms usually persist for about 3 to 10 days, but can last up to 2 weeks.  It is important to know that upper respiratory infections do not cause serious illness or complications in most cases.    Upper respiratory infections can be transmitted from person to person, with the most common method of transmission being a person's hands.  The virus is able to live on the skin and can infect other persons for up to 2 hours after direct contact.  Also, these can be transmitted when someone coughs or sneezes; thus, it is important to cover the mouth to reduce this risk.  To keep the spread of the illness at [place], good hand hygiene is very important.  This is an infection that is most likely caused by a virus. There are no specific treatments other than to help you with the symptoms until the infection runs its course.  We are sorry you are not feeling well.  Here is how we plan to help!   For nasal congestion, you may  use an oral decongestants such as Mucinex D or if you have glaucoma or high blood pressure use plain Mucinex.  Saline nasal spray or nasal drops can help and can safely be used as often as needed for congestion.  For your congestion, I have prescribed Fluticasone nasal spray one spray in each nostril twice a day  If you do not have a history of heart disease, hypertension, diabetes or thyroid disease, prostate/bladder issues or glaucoma, you may also use Sudafed to treat nasal congestion.  It is highly recommended that you consult with a pharmacist or your primary care physician to ensure this medication is safe for you to take.     If you have a cough, you may use cough suppressants such as Delsym and Robitussin.  If you have glaucoma or high blood pressure, you can also use Coricidin HBP.   For cough I have prescribed for you A prescription cough medication called Tessalon Perles 100 mg. You may take 1-2 capsules every 8 hours as needed for cough.  If you have a sore or scratchy throat, use a saltwater gargle-  to  teaspoon of salt dissolved in a 4-ounce to 8-ounce glass of warm water.  Gargle the solution for approximately 15-30 seconds and then spit.  It is important not to swallow the solution.  You can also use throat lozenges/cough drops and Chloraseptic spray to help with  throat pain or discomfort.  Warm or cold liquids can also be helpful in relieving throat pain.  For headache, pain or general discomfort, you can use Ibuprofen or Tylenol as directed.   Some authorities believe that zinc sprays or the use of Echinacea may shorten the course of your symptoms.   HOME CARE . Only take medications as instructed by your medical team. . Be sure to drink plenty of fluids. Water is fine as well as fruit juices, sodas and electrolyte beverages. You may want to stay away from caffeine or alcohol. If you are nauseated, try taking small sips of liquids. How do you know if you are getting enough fluid?  Your urine should be a pale yellow or almost colorless. . Get rest. . Taking a steamy shower or using a humidifier may help nasal congestion and ease sore throat pain. You can place a towel over your head and breathe in the steam from hot water coming from a faucet. . Using a saline nasal spray works much the same way. . Cough drops, hard candies and sore throat lozenges may ease your cough. . Avoid close contacts especially the very young and the elderly . Cover your mouth if you cough or sneeze . Always remember to wash your hands.   GET HELP RIGHT AWAY IF: . You develop worsening fever. . If your symptoms do not improve within 10 days . You develop yellow or green discharge from your nose over 3 days. . You have coughing fits . You develop a severe head ache or visual changes. . You develop shortness of breath, difficulty breathing or start having chest pain . Your symptoms persist after you have completed your treatment plan  MAKE SURE YOU   Understand these instructions.  Will watch your condition.  Will get help right away if you are not doing well or get worse.  Your e-visit answers were reviewed by a board certified advanced clinical practitioner to complete your personal care plan. Depending upon the condition, your plan could have included both over the counter or prescription medications. Please review your pharmacy choice. If there is a problem, you may call our nursing hot line at and have the prescription routed to another pharmacy. Your safety is important to [place]. If you have drug allergies check your prescription carefully.   You can use MyChart to ask questions about today's visit, request a non-urgent call back, or ask for a work or school excuse for 24 hours related to this e-Visit. If it has been greater than 24 hours you will need to follow up with your provider, or enter a new e-Visit to address those concerns. You will get an e-mail in the next two days asking  about your experience.  I hope that your e-visit has been valuable and will speed your recovery. Thank you for using e-visits.

## 2018-12-26 ENCOUNTER — Other Ambulatory Visit: Payer: Self-pay | Admitting: Family Medicine

## 2019-01-19 ENCOUNTER — Other Ambulatory Visit: Payer: Self-pay | Admitting: Family Medicine

## 2019-01-22 ENCOUNTER — Encounter: Payer: Self-pay | Admitting: Family Medicine

## 2019-01-22 ENCOUNTER — Telehealth: Payer: Self-pay | Admitting: *Deleted

## 2019-01-22 NOTE — Telephone Encounter (Signed)
Called pt about mychart message she just wants to cancel appt

## 2019-01-28 ENCOUNTER — Ambulatory Visit: Payer: Self-pay | Admitting: Family Medicine

## 2019-02-27 ENCOUNTER — Telehealth: Payer: Self-pay | Admitting: Family Medicine

## 2019-02-27 DIAGNOSIS — I10 Essential (primary) hypertension: Secondary | ICD-10-CM

## 2019-02-27 DIAGNOSIS — E785 Hyperlipidemia, unspecified: Secondary | ICD-10-CM

## 2019-02-27 NOTE — Telephone Encounter (Signed)
Please send labs for the pt over to [facility], pt is a self pay (no insurance) please call the pt back to advise.

## 2019-02-27 NOTE — Telephone Encounter (Signed)
Spoke with patient and advised her labs have been ordered and they are fasting labs

## 2019-03-11 LAB — BASIC METABOLIC PANEL WITH GFR
BUN: 15 mg/dL (ref 7–25)
CO2: 33 mmol/L — ABNORMAL HIGH (ref 20–32)
Calcium: 9.5 mg/dL (ref 8.6–10.4)
Chloride: 103 mmol/L (ref 98–110)
Creat: 0.94 mg/dL (ref 0.50–1.05)
GFR, Est African American: 79 mL/min/{1.73_m2} (ref >=60)
GFR, Est Non African American: 68 mL/min/{1.73_m2} (ref >=60)
Glucose, Bld: 89 mg/dL (ref 65–99)
Potassium: 4.1 mmol/L (ref 3.5–5.3)
Sodium: 143 mmol/L (ref 135–146)

## 2019-03-11 LAB — LIPID PANEL
Cholesterol: 246 mg/dL — ABNORMAL HIGH (ref <200)
HDL: 56 mg/dL (ref >=50)
LDL Cholesterol (Calc): 173 mg/dL (calc) — ABNORMAL HIGH
Non-HDL Cholesterol (Calc): 190 mg/dL (calc) — ABNORMAL HIGH (ref <130)
Total CHOL/HDL Ratio: 4.4 (calc) (ref <5.0)
Triglycerides: 71 mg/dL (ref <150)

## 2019-03-13 ENCOUNTER — Other Ambulatory Visit: Payer: Self-pay | Admitting: Family Medicine

## 2019-03-17 ENCOUNTER — Other Ambulatory Visit: Payer: Self-pay

## 2019-03-17 MED ORDER — LORATADINE 10 MG PO TABS: ORAL_TABLET | ORAL | 1 refills | Status: DC

## 2019-03-24 ENCOUNTER — Ambulatory Visit (INDEPENDENT_AMBULATORY_CARE_PROVIDER_SITE_OTHER): Payer: Self-pay | Admitting: Family Medicine

## 2019-03-24 ENCOUNTER — Encounter: Payer: Self-pay | Admitting: Family Medicine

## 2019-03-24 ENCOUNTER — Other Ambulatory Visit: Payer: Self-pay

## 2019-03-24 VITALS — BP 122/82 | HR 92 | Resp 12 | Ht 69.0 in | Wt 230.1 lb

## 2019-03-24 DIAGNOSIS — E669 Obesity, unspecified: Secondary | ICD-10-CM

## 2019-03-24 DIAGNOSIS — I10 Essential (primary) hypertension: Secondary | ICD-10-CM

## 2019-03-24 DIAGNOSIS — E785 Hyperlipidemia, unspecified: Secondary | ICD-10-CM

## 2019-03-24 MED ORDER — LORATADINE 10 MG PO TABS: ORAL_TABLET | ORAL | 3 refills | Status: DC

## 2019-03-24 MED ORDER — SPIRONOLACTONE 25 MG PO TABS: 25.0000 mg | ORAL_TABLET | Freq: Every day | ORAL | 3 refills | Status: DC

## 2019-03-24 MED ORDER — POTASSIUM CHLORIDE CRYS ER 20 MEQ PO TBCR: 20.0000 meq | EXTENDED_RELEASE_TABLET | Freq: Two times a day (BID) | ORAL | 3 refills | Status: DC

## 2019-03-24 MED ORDER — AMLODIPINE BESYLATE 10 MG PO TABS: 10.0000 mg | ORAL_TABLET | Freq: Every day | ORAL | 3 refills | Status: DC

## 2019-03-24 MED ORDER — FUROSEMIDE 40 MG PO TABS: 40.0000 mg | ORAL_TABLET | Freq: Every day | ORAL | 3 refills | Status: DC

## 2019-03-24 NOTE — Patient Instructions (Addendum)
F/U in January, call if you need me sooner  Pap at that visit  Pls look into getting a free mammogram through the system since you have no ins ( pls provide pt with number to call)  We will give you paperwork to to apply for your medication free through the health dept  CBC, fasting lipid, cad chem 7 and EGFr first week in January ( call back for lab order)  [facility] has an excellent grief support grp that I advise you to look at  I will let you know about any additional  Needed follow up, ( aneurysm, hematology)I will send a message later this week

## 2019-03-26 ENCOUNTER — Other Ambulatory Visit: Payer: Self-pay | Admitting: Family Medicine

## 2019-03-30 ENCOUNTER — Encounter: Payer: Self-pay | Admitting: Family Medicine

## 2019-03-30 NOTE — Assessment & Plan Note (Signed)
  Patient re-educated about  the importance of commitment to a  minimum of 150 minutes of exercise per week as able.  The importance of healthy food choices with portion control discussed, as well as eating regularly and within a 12 hour window most days. The need to choose "clean , green" food 50 to 75% of the time is discussed, as well as to make water the primary drink and set a goal of 64 ounces water daily.    Weight /BMI 03/24/2019 08/29/2018 08/28/2018  WEIGHT 230 lb 1.9 oz 215 lb 215 lb  HEIGHT 5\' 9"  5\' 9"  5\' 9"   BMI 33.98 kg/m2 31.75 kg/m2 31.75 kg/m2

## 2019-03-30 NOTE — Progress Notes (Signed)
Allison Hart     MRN: [id number]      DOB: [birth year]   HPI [name] is here for follow up and re-evaluation of chronic medical conditions, medication management and review of any available recent lab and radiology data.  Preventive health is updated, specifically  Cancer screening and Immunization.   Questions or concerns regarding consultations or procedures which the PT has had in the interim are  addressed. The PT denies any adverse reactions to current medications since the last visit.  There are no new concerns.  There are no specific complaints   ROS Denies recent fever or chills. Denies sinus pressure, nasal congestion, ear pain or sore throat. Denies chest congestion, productive cough or wheezing. Denies chest pains, palpitations and leg swelling Denies abdominal pain, nausea, vomiting,diarrhea or constipation.   Denies dysuria, frequency, hesitancy or incontinence. Denies joint pain, swelling and limitation in mobility. Denies headaches, seizures, numbness, or tingling. C/o depression, grieving loss of her son, not suicidal or homicidal will benefit from therapy  PE  BP 122/82   Pulse 92   Resp 12   Ht 5\' 9"  (1.753 m)   Wt 230 lb 1.9 oz (104.4 kg)   SpO2 95%   BMI 33.98 kg/m   Patient alert and oriented and in no cardiopulmonary distress.  HEENT: No facial asymmetry, EOMI,   oropharynx pink and moist.  Neck supple no JVD, no mass.  Chest: Clear to auscultation bilaterally.  CVS: S1, S2 no murmurs, no S3.Regular rate.  ABD: Soft non tender.   Ext: No edema  MS: Adequate ROM spine, shoulders, hips and knees.  Skin: Intact, no ulcerations or rash noted.  Psych: Good eye contact, normal affect. Memory intact not anxious or depressed appearing.  CNS: CN 2-12 intact, power,  normal throughout.no focal deficits noted.   Assessment & Plan  Essential hypertension Controlled, no change in medication DASH diet and commitment to daily physical activity  for a minimum of 30 minutes discussed and encouraged, as a part of hypertension management. The importance of attaining a healthy weight is also discussed.  BP/Weight 03/24/2019 08/29/2018 08/28/2018 08/13/2018 08/06/2018 07/30/2018 3/89/3734  Systolic BP 287 681 157 262 035 597 416  Diastolic BP 82 95 88 93 85 82 84  Wt. (Lbs) 230.12 215 215 215.2 221 221.12 -  BMI 33.98 31.75 31.75 31.78 32.64 32.65 -       Hyperlipidemia LDL goal <100 Hyperlipidemia:Low fat diet discussed and encouraged.   Lipid Panel  Lab Results  Component Value Date   CHOL 246 (H) 03/10/2019   HDL 56 03/10/2019   LDLCALC 173 (H) 03/10/2019   TRIG 71 03/10/2019   CHOLHDL 4.4 03/10/2019  uncontrolled, though risk ration for Cv disease  less than 5, needs to lower fat intakease     Obesity (BMI 30.0-34.9)  Patient re-educated about  the importance of commitment to a  minimum of 150 minutes of exercise per week as able.  The importance of healthy food choices with portion control discussed, as well as eating regularly and within a 12 hour window most days. The need to choose "clean , green" food 50 to 75% of the time is discussed, as well as to make water the primary drink and set a goal of 64 ounces water daily.    Weight /BMI 03/24/2019 08/29/2018 08/28/2018  WEIGHT 230 lb 1.9 oz 215 lb 215 lb  HEIGHT 5\' 9"  5\' 9"  5\' 9"   BMI 33.98 kg/m2 31.75 kg/m2 31.75 kg/m2

## 2019-03-30 NOTE — Assessment & Plan Note (Signed)
Hyperlipidemia:Low fat diet discussed and encouraged.   Lipid Panel  Lab Results  Component Value Date   CHOL 246 (H) 03/10/2019   HDL 56 03/10/2019   LDLCALC 173 (H) 03/10/2019   TRIG 71 03/10/2019   CHOLHDL 4.4 03/10/2019  uncontrolled, though risk ration for Cv disease  less than 5, needs to lower fat intakease

## 2019-03-30 NOTE — Assessment & Plan Note (Signed)
Controlled, no change in medication DASH diet and commitment to daily physical activity for a minimum of 30 minutes discussed and encouraged, as a part of hypertension management. The importance of attaining a healthy weight is also discussed.  BP/Weight 03/24/2019 08/29/2018 08/28/2018 08/13/2018 08/06/2018 07/30/2018 05/20/7516  Systolic BP 001 749 449 675 916 384 665  Diastolic BP 82 95 88 93 85 82 84  Wt. (Lbs) 230.12 215 215 215.2 221 221.12 -  BMI 33.98 31.75 31.75 31.78 32.64 32.65 -

## 2019-03-31 ENCOUNTER — Other Ambulatory Visit: Payer: Self-pay | Admitting: Family Medicine

## 2019-04-28 ENCOUNTER — Other Ambulatory Visit (HOSPITAL_COMMUNITY): Payer: Self-pay | Admitting: *Deleted

## 2019-04-28 DIAGNOSIS — Z1231 Encounter for screening mammogram for malignant neoplasm of breast: Secondary | ICD-10-CM

## 2019-05-19 ENCOUNTER — Encounter: Payer: Self-pay | Admitting: Family Medicine

## 2019-05-19 ENCOUNTER — Other Ambulatory Visit: Payer: Self-pay | Admitting: Family Medicine

## 2019-05-19 MED ORDER — SPIRONOLACTONE 25 MG PO TABS: 25.0000 mg | ORAL_TABLET | Freq: Every day | ORAL | 3 refills | Status: DC

## 2019-07-15 ENCOUNTER — Other Ambulatory Visit: Payer: Self-pay

## 2019-07-15 ENCOUNTER — Ambulatory Visit (HOSPITAL_COMMUNITY)
Admission: RE | Admit: 2019-07-15 | Discharge: 2019-07-15 | Disposition: A | Discharge status: Home / Self Care | Payer: Self-pay | Source: Ambulatory Visit | Attending: Obstetrics and Gynecology | Admitting: Obstetrics and Gynecology

## 2019-07-15 ENCOUNTER — Encounter (HOSPITAL_COMMUNITY): Payer: Self-pay

## 2019-07-15 ENCOUNTER — Ambulatory Visit
Admission: RE | Admit: 2019-07-15 | Discharge: 2019-07-15 | Disposition: A | Discharge status: Home / Self Care | Payer: No Typology Code available for payment source | Source: Ambulatory Visit | Attending: Obstetrics and Gynecology | Admitting: Obstetrics and Gynecology

## 2019-07-15 DIAGNOSIS — Z1231 Encounter for screening mammogram for malignant neoplasm of breast: Secondary | ICD-10-CM

## 2019-07-15 DIAGNOSIS — Z1239 Encounter for other screening for malignant neoplasm of breast: Secondary | ICD-10-CM | POA: Insufficient documentation

## 2019-07-15 NOTE — Patient Instructions (Signed)
Explained breast self awareness with Ridhima C Propps. Patient did not need a Pap smear today due to last Pap smear and HPV typing was 05/29/2016. Let her know [facility] will cover Pap smears and HPV typing every 5 years unless has a history of abnormal Pap smears. Referred patient to the [facility] for a screening mammogram. Appointment scheduled for Tuesday, July 15, 2019 at 1510. Patient aware of appointment and will be there. Let patient know the [facility] will follow up with her within the next couple weeks with results of her mammogram by letter or phone. Tiahna C Dabbs verbalized understanding.  Yasiel Goyne, Arvil Chaco, RN 1:33 PM

## 2019-07-15 NOTE — Progress Notes (Signed)
No complaints today.   Pap Smear: Pap smear not completed today. Last Pap smear was 05/29/2016 at [facility] and normal with negative HPV. Per patient has no history of an abnormal Pap smear. Last  Pap smear result is in Epic.  Physical exam: Breasts Left breast is larger than right breast that is normal per patient. No skin abnormalities bilateral breasts. No nipple retraction bilateral breasts. No nipple discharge bilateral breasts. No lymphadenopathy. No lumps palpated bilateral breasts. No complaints of pain or tenderness on exam. Referred patient to the [facility] for a screening mammogram. Appointment scheduled for Tuesday, July 15, 2019 at 1510.        Pelvic/Bimanual No Pap smear completed today since last Pap smear and HPV typing was 05/29/2016. Pap smear not indicated per [facility] guidelines.   Smoking History: Patient is a former smoker that quit in 1994.  Patient Navigation: Patient education provided. Access to services provided for patient through [facility] program.   Breast and Cervical Cancer Risk Assessment: Patient has no family history of breast cancer, known genetic mutations, or radiation treatment to the chest before age 33. Patient has no history of cervical dysplasia, immunocompromised, or DES exposure in-utero.  Risk Assessment    Risk Scores      07/15/2019   Last edited by: Loletta Parish, RN   5-year risk: 1.4 %   Lifetime risk: 7.5 %

## 2019-09-01 ENCOUNTER — Other Ambulatory Visit: Payer: Self-pay

## 2019-09-01 DIAGNOSIS — Z20822 Contact with and (suspected) exposure to covid-19: Secondary | ICD-10-CM

## 2019-09-02 LAB — NOVEL CORONAVIRUS, NAA: SARS-CoV-2, NAA: NOT DETECTED

## 2019-09-16 ENCOUNTER — Other Ambulatory Visit: Payer: Self-pay

## 2019-09-16 DIAGNOSIS — Z20822 Contact with and (suspected) exposure to covid-19: Secondary | ICD-10-CM

## 2019-09-18 LAB — NOVEL CORONAVIRUS, NAA: SARS-CoV-2, NAA: NOT DETECTED

## 2019-10-09 ENCOUNTER — Encounter: Payer: Self-pay | Admitting: Family Medicine

## 2019-10-13 ENCOUNTER — Ambulatory Visit (INDEPENDENT_AMBULATORY_CARE_PROVIDER_SITE_OTHER): Payer: 59 | Admitting: Family Medicine

## 2019-10-13 ENCOUNTER — Other Ambulatory Visit: Payer: Self-pay

## 2019-10-13 ENCOUNTER — Encounter: Payer: Self-pay | Admitting: Family Medicine

## 2019-10-13 VITALS — BP 122/84 | HR 87 | Resp 15 | Ht 69.0 in | Wt 232.0 lb

## 2019-10-13 DIAGNOSIS — Z23 Encounter for immunization: Secondary | ICD-10-CM | POA: Diagnosis not present

## 2019-10-13 DIAGNOSIS — E785 Hyperlipidemia, unspecified: Secondary | ICD-10-CM | POA: Diagnosis not present

## 2019-10-13 DIAGNOSIS — I10 Essential (primary) hypertension: Secondary | ICD-10-CM | POA: Diagnosis not present

## 2019-10-13 DIAGNOSIS — E669 Obesity, unspecified: Secondary | ICD-10-CM

## 2019-10-13 NOTE — Patient Instructions (Addendum)
Annual exam with pap in office in July 2021, call if you need me before  It is important that you exercise regularly at least 30 minutes 5 times a week. If you develop chest pain, have severe difficulty breathing, or feel very tired, stop exercising immediately and seek medical attention  C  No new medications at this time   Medications from med assist come through the health dept so please reach out for your meds this year again  Thanks for choosing [facility], we consider it a privelige to serve you.

## 2019-10-13 NOTE — Progress Notes (Signed)
   Allison Hart     MRN: [id number]      DOB: Dec 28, 1960   HPI Allison Hart is here for follow up and re-evaluation of chronic medical conditions, medication management and review of any available recent lab and radiology data.  Preventive health is updated, specifically  Cancer screening and Immunization.   There are no new concerns.  There are no specific complaints   ROS Denies recent fever or chills. Denies sinus pressure, nasal congestion, ear pain or sore throat. Denies chest congestion, productive cough or wheezing. Denies chest pains, palpitations and leg swelling Denies abdominal pain, nausea, vomiting,diarrhea or constipation.   Denies dysuria, frequency, hesitancy or incontinence. Denies joint pain, swelling and limitation in mobility. Denies headaches, seizures, numbness, or tingling. Denies depression, anxiety or insomnia. Denies skin break down or rash.   PE  BP 122/84   Pulse 87   Resp 15   Ht 5\' 9"  (1.753 m)   Wt 232 lb 0.6 oz (105.3 kg)   LMP  (LMP Unknown)   SpO2 100%   BMI 34.27 kg/m   Patient alert and oriented and in no cardiopulmonary distress.  HEENT: No facial asymmetry, EOMI,     Neck supple .  Chest: Clear to auscultation bilaterally.  CVS: S1, S2 no murmurs, no S3.Regular rate.  ABD: Soft non tender.   Ext: No edema  MS: Adequate ROM spine, shoulders, hips and knees.  Skin: Intact, no ulcerations or rash noted.  Psych: Good eye contact, normal affect. Memory intact not anxious or depressed appearing.  CNS: CN 2-12 intact, power,  normal throughout.no focal deficits noted.   Assessment & Plan  Essential hypertension Controlled, no change in medication DASH diet and commitment to daily physical activity for a minimum of 30 minutes discussed and encouraged, as a part of hypertension management. The importance of attaining a healthy weight is also discussed.  BP/Weight 10/13/2019 07/15/2019 03/24/2019 08/29/2018 08/28/2018 08/13/2018  0000000  Systolic BP 123XX123 123XX123 123XX123 Q000111Q Q000111Q 0000000 [id number]  Diastolic BP 84 80 82 95 88 93 85  Wt. (Lbs) 232.04 230.5 230.12 215 215 215.2 221  BMI 34.27 34.04 33.98 31.75 31.75 31.78 32.64       Obesity (BMI 30.0-34.9)  Patient re-educated about  the importance of commitment to a  minimum of 150 minutes of exercise per week as able.  The importance of healthy food choices with portion control discussed, as well as eating regularly and within a 12 hour window most days. The need to choose "clean , green" food 50 to 75% of the time is discussed, as well as to make water the primary drink and set a goal of 64 ounces water daily.    Weight /BMI 10/13/2019 07/15/2019 03/24/2019  WEIGHT 232 lb 0.6 oz 230 lb 8 oz 230 lb 1.9 oz  HEIGHT 5\' 9"  - 5\' 9"   BMI 34.27 kg/m2 34.04 kg/m2 33.98 kg/m2      Hyperlipidemia LDL goal <100 Hyperlipidemia:Low fat diet discussed and encouraged.   Lipid Panel  Lab Results  Component Value Date   CHOL 246 (H) 03/10/2019   HDL 56 03/10/2019   LDLCALC 173 (H) 03/10/2019   TRIG 71 03/10/2019   CHOLHDL 4.4 03/10/2019  needs to reduce fat intake, totl and LDL are markedly elevated

## 2019-10-17 ENCOUNTER — Encounter: Payer: Self-pay | Admitting: Family Medicine

## 2019-10-17 NOTE — Assessment & Plan Note (Signed)
Hyperlipidemia:Low fat diet discussed and encouraged.   Lipid Panel  Lab Results  Component Value Date   CHOL 246 (H) 03/10/2019   HDL 56 03/10/2019   LDLCALC 173 (H) 03/10/2019   TRIG 71 03/10/2019   CHOLHDL 4.4 03/10/2019  needs to reduce fat intake, totl and LDL are markedly elevated

## 2019-10-17 NOTE — Assessment & Plan Note (Signed)
Controlled, no change in medication DASH diet and commitment to daily physical activity for a minimum of 30 minutes discussed and encouraged, as a part of hypertension management. The importance of attaining a healthy weight is also discussed.  BP/Weight 10/13/2019 07/15/2019 03/24/2019 08/29/2018 08/28/2018 08/13/2018 0000000  Systolic BP 123XX123 123XX123 123XX123 Q000111Q Q000111Q 0000000 [id number]  Diastolic BP 84 80 82 95 88 93 85  Wt. (Lbs) 232.04 230.5 230.12 215 215 215.2 221  BMI 34.27 34.04 33.98 31.75 31.75 31.78 32.64

## 2019-10-17 NOTE — Assessment & Plan Note (Signed)
  Patient re-educated about  the importance of commitment to a  minimum of 150 minutes of exercise per week as able.  The importance of healthy food choices with portion control discussed, as well as eating regularly and within a 12 hour window most days. The need to choose "clean , green" food 50 to 75% of the time is discussed, as well as to make water the primary drink and set a goal of 64 ounces water daily.    Weight /BMI 10/13/2019 07/15/2019 03/24/2019  WEIGHT 232 lb 0.6 oz 230 lb 8 oz 230 lb 1.9 oz  HEIGHT 5\' 9"  - 5\' 9"   BMI 34.27 kg/m2 34.04 kg/m2 33.98 kg/m2

## 2019-10-20 ENCOUNTER — Encounter: Payer: Self-pay | Admitting: Family Medicine

## 2019-11-18 ENCOUNTER — Other Ambulatory Visit: Payer: Self-pay

## 2019-11-18 MED ORDER — POTASSIUM CHLORIDE CRYS ER 20 MEQ PO TBCR: EXTENDED_RELEASE_TABLET | ORAL | 0 refills | Status: DC

## 2019-11-19 ENCOUNTER — Encounter: Payer: Self-pay | Admitting: Family Medicine

## 2019-12-20 ENCOUNTER — Ambulatory Visit: Payer: 59 | Attending: Internal Medicine

## 2019-12-20 DIAGNOSIS — Z23 Encounter for immunization: Secondary | ICD-10-CM

## 2019-12-20 NOTE — Progress Notes (Signed)
   Covid-19 Vaccination Clinic  Name:  VERDEAN BRANDO    MRN: [id number] DOB: May 31, 1961  12/20/2019  Ms. Consuegra was observed post Covid-19 immunization for 15 minutes without incident. She was provided with Vaccine Information Sheet and instruction to access the V-Safe system.   Ms. Neukam was instructed to call 911 with any severe reactions post vaccine: Marland Kitchen Difficulty breathing  . Swelling of face and throat  . A fast heartbeat  . A bad rash all over body  . Dizziness and weakness   Immunizations Administered    Name Date Dose VIS Date Route   Moderna COVID-19 Vaccine 12/20/2019 10:12 AM 0.5 mL 09/09/2019 Intramuscular   Manufacturer: Moderna   Lot: [id number]   [id number]

## 2020-01-20 ENCOUNTER — Ambulatory Visit: Payer: 59

## 2020-01-21 ENCOUNTER — Ambulatory Visit: Payer: 59 | Attending: Internal Medicine

## 2020-01-21 ENCOUNTER — Ambulatory Visit: Payer: 59

## 2020-01-21 DIAGNOSIS — Z23 Encounter for immunization: Secondary | ICD-10-CM

## 2020-01-21 NOTE — Progress Notes (Signed)
   Covid-19 Vaccination Clinic  Name:  Allison Hart    MRN: [id number] DOB: 05-Oct-1961  01/21/2020  [name] was observed post Covid-19 immunization for 15 minutes without incident. She was provided with Vaccine Information Sheet and instruction to access the V-Safe system.   [name] was instructed to call 911 with any severe reactions post vaccine: Marland Kitchen Difficulty breathing  . Swelling of face and throat  . A fast heartbeat  . A bad rash all over body  . Dizziness and weakness   Immunizations Administered    Name Date Dose VIS Date Route   Moderna COVID-19 Vaccine 01/21/2020  2:18 PM 0.5 mL 09/09/2019 Intramuscular   Manufacturer: Moderna   Lot: [id number]   [id number]

## 2020-01-26 ENCOUNTER — Ambulatory Visit: Payer: 59 | Admitting: Interventional Cardiology

## 2020-02-01 NOTE — Progress Notes (Signed)
Cardiology Office Note   Date:  02/02/2020   ID:  Allison Hart, DOB Aug 03, 1961, MRN [id number]  PCP:  Fayrene Helper, MD    No chief complaint on file.  Pulmonary hypertension  Wt Readings from Last 3 Encounters:  02/02/20 221 lb (100.2 kg)  10/13/19 232 lb 0.6 oz (105.3 kg)  07/15/19 230 lb 8 oz (104.6 kg)       History of Present Illness: Allison Hart is a 59 y.o. female   Who had a negative stress echo in 2013.   She was found to have enlarged lymph nodes. 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  THere was a question of brain aneurysm, but angio was negative.  Somehow, there was a question of pulmonary hypertension in 2018 that prompted her visit with me.   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 We changed diuretic to Lasix to see if this would help with increased PA pressrure seen on echo.   There was a delay in starting the Lasix, but she did diurese well.  Patient was asymptomatic as her dyspnea had improved, so we did not repeat an echocardiogram at that time.  Since the last visit, she has felt well.  She describes a sensation of taking time to get started with an activity.    Not walking regularly.  She works several days a week. She describes herself as "lazy."  She got both of her COVID shots.   Denies : Chest pain. Dizziness. Leg edema. Nitroglycerin use. Orthopnea. Palpitations. Paroxysmal nocturnal dyspnea. Shortness of breath. Syncope.   Sx still better with Lasix.   Ate  A lot of fast food during COVID.    Past Medical History:  Diagnosis Date  . Anxiety   . Arthritis   . Essential hypertension, benign   . GERD (gastroesophageal reflux disease)   . Herpes   . History of kidney stones   . Murmur   . Obesity   . Sinusitis   . Urinary frequency     Past Surgical History:  Procedure Laterality Date  . BIOPSY  08/29/2018   Procedure: BIOPSY;  Surgeon: Daneil Dolin, MD;  [facility];   Service: Endoscopy;;  gastric  . COLONOSCOPY  11/13/2012   Procedure: COLONOSCOPY;  Surgeon: Rogene Houston, MD;  [facility];  Service: Endoscopy;  Laterality: N/A;  930  . embolization of uterine artery X 2    . ESOPHAGOGASTRODUODENOSCOPY (EGD) WITH PROPOFOL N/A 08/29/2018   Procedure: ESOPHAGOGASTRODUODENOSCOPY (EGD) WITH PROPOFOL;  Surgeon: Daneil Dolin, MD;  [facility];  Service: Endoscopy;  Laterality: N/A;  3:00pm  . INGUINAL LYMPH NODE BIOPSY Right 04/14/2016   Procedure: RIGHT INGUINAL LYMPH NODE BIOPSY;  Surgeon: Aviva Signs, MD;  Location: [facility];  Service: General;  Laterality: Right;  . IR ANGIO INTRA EXTRACRAN SEL COM CAROTID INNOMINATE BILAT MOD SED  02/12/2017  . IR ANGIO VERTEBRAL SEL VERTEBRAL BILAT MOD SED  02/12/2017  . IR GENERIC HISTORICAL  04/27/2016   IR RADIOLOGIST EVAL & MGMT 04/27/2016 Jacqulynn Cadet, MD [facility] INTERV RAD  . IR GENERIC HISTORICAL  12/05/2016   IR RADIOLOGIST EVAL & MGMT 12/05/2016 Jacqulynn Cadet, MD [facility] INTERV RAD  . IR RADIOLOGIST EVAL & MGMT  01/22/2017  . IR RADIOLOGIST EVAL & MGMT  08/06/2018  . Left inguinal hernia repair and endometriosis  2007   No definitive records   . Left parotid bx benign  2005  . RADIOLOGY WITH ANESTHESIA N/A 01/18/2016   Procedure: RADIOLOGY WITH ANESTHESIA;  Surgeon: Luanne Bras, MD;  Location: [facility];  Service: Radiology;  Laterality: N/A;  . RADIOLOGY WITH ANESTHESIA N/A 02/12/2017   Procedure: Cherie Dark EMBOLIZATION;  Surgeon: Luanne Bras, MD;  Location: [facility];  Service: Radiology;  Laterality: N/A;  . renal artery aneurysm repair right Right 01/2016  . UMBILICAL HERNIA REPAIR  infancy      Current Outpatient Medications  Medication Sig Dispense Refill  . acyclovir (ZOVIRAX) 800 MG tablet Take 800 mg by mouth 5 (five) times daily.    Marland Kitchen amLODipine (NORVASC) 10 MG tablet Take 1 tablet (10 mg total) by mouth daily. 90 tablet 3  . aspirin EC 81 MG tablet Take 1 tablet (81 mg  total) by mouth daily. 100 tablet 2  . cholecalciferol (VITAMIN D) 1000 units tablet Take 1,000 Units by mouth daily.    . fluticasone (FLONASE) 50 MCG/ACT nasal spray Place 2 sprays into both nostrils daily. 16 g 6  . furosemide (LASIX) 40 MG tablet Take 1 tablet by mouth once daily 90 tablet 0  . loratadine (CLARITIN) 10 MG tablet Once daily 90 tablet 3  . potassium chloride SA (KLOR-CON) 20 MEQ tablet TAKE 1  BY MOUTH TWICE DAILY 180 tablet 0  . Pyridoxine HCl (VITAMIN B-6 PO) Take 1 tablet by mouth daily.    Marland Kitchen spironolactone (ALDACTONE) 25 MG tablet Take 1 tablet (25 mg total) by mouth daily. 90 tablet 3   No current facility-administered medications for this visit.    Allergies:   Versed [midazolam] and Sulfa antibiotics    Social History:  The patient  reports that she quit smoking about 24 years ago. Her smoking use included cigarettes. She has a 1.25 pack-year smoking history. She has never used smokeless tobacco. She reports current alcohol use. She reports that she does not use drugs.   Family History:  The patient's family history includes Coronary artery disease in her mother; Fibroids in her sister; Hyperlipidemia in her mother; Hypertension in her mother and sister; Irregular heart beat in her mother; Thyroid disease in her mother; Ulcerative colitis in her son.    ROS:  Please see the history of present illness.   Otherwise, review of systems are positive for slow to get started.   All other systems are reviewed and negative.    PHYSICAL EXAM: VS:  BP 124/88   Pulse 80   Ht 5\' 9"  (1.753 m)   Wt 221 lb (100.2 kg)   LMP  (LMP Unknown)   SpO2 96%   BMI 32.64 kg/m  , BMI Body mass index is 32.64 kg/m. GEN: Well nourished, well developed, in no acute distress  HEENT: normal  Neck: no JVD, carotid bruits, or masses Cardiac: RRR; no murmurs, rubs, or gallops,no edema  Respiratory:  clear to auscultation bilaterally, normal work of breathing GI: soft, nontender,  nondistended, + BS MS: no deformity or atrophy  Skin: warm and dry, no rash Neuro:  Strength and sensation are intact Psych: euthymic mood, full affect   EKG:   The ekg ordered today demonstrates NSR, no ST changes   Recent Labs: 03/10/2019: BUN 15; Creat 0.94; Potassium 4.1; Sodium 143   Lipid Panel    Component Value Date/Time   CHOL 246 (H) 03/10/2019 0806   TRIG 71 03/10/2019 0806   HDL 56 03/10/2019 0806   CHOLHDL 4.4 03/10/2019 0806   VLDL 15 05/22/2016 1403   LDLCALC 173 (  H) 03/10/2019 [id number]     Other studies Reviewed: Additional studies/ records that were reviewed today with results demonstrating: LDL 173, in June 2020.   ASSESSMENT AND PLAN:  1. Pulmonary hypertension: She states she did not need CPAP after sleep study.  Breathing overall is good.  She is not interested in any testing at this time.  2. Hypertension: The current medical regimen is effective;  continue present plan and medications.  Avoid processed food.  Lw salt diet.  3. Dyspnea on exertion: Improved with diuresis back in 2018. 4. Prior aneurysm: Selective renal stents being placed.  She should f/u with Dr. Estanislado Pandy.   Current medicines are reviewed at length with the patient today.  The patient concerns regarding her medicines were addressed.  The following changes have been made:  No change  Labs/ tests ordered today include:  No orders of the defined types were placed in this encounter.   Recommend 150 minutes/week of aerobic exercise Low fat, low carb, high fiber diet recommended  Disposition:   FU in 1 year   Signed, Larae Grooms, MD  02/02/2020 12:17 PM    [facility] [address], [place], [place]  [address] Phone: [phone]; Fax: [phone]

## 2020-02-02 ENCOUNTER — Encounter: Payer: Self-pay | Admitting: Interventional Cardiology

## 2020-02-02 ENCOUNTER — Ambulatory Visit (INDEPENDENT_AMBULATORY_CARE_PROVIDER_SITE_OTHER): Payer: 59 | Admitting: Interventional Cardiology

## 2020-02-02 VITALS — BP 124/88 | HR 80 | Ht 69.0 in | Wt 221.0 lb

## 2020-02-02 DIAGNOSIS — I272 Pulmonary hypertension, unspecified: Secondary | ICD-10-CM | POA: Diagnosis not present

## 2020-02-02 DIAGNOSIS — R0609 Other forms of dyspnea: Secondary | ICD-10-CM

## 2020-02-02 DIAGNOSIS — I729 Aneurysm of unspecified site: Secondary | ICD-10-CM

## 2020-02-02 DIAGNOSIS — I10 Essential (primary) hypertension: Secondary | ICD-10-CM

## 2020-02-02 DIAGNOSIS — R0600 Dyspnea, unspecified: Secondary | ICD-10-CM | POA: Diagnosis not present

## 2020-02-02 NOTE — Patient Instructions (Signed)
Medication Instructions:  Your physician recommends that you continue on your current medications as directed. Please refer to the Current Medication list given to you today.  *If you need a refill on your cardiac medications before your next appointment, please call your pharmacy*   Lab Work: None ordered  If you have labs (blood work) drawn today and your tests are completely normal, you will receive your results only by: Marland Kitchen MyChart Message (if you have MyChart) OR . A paper copy in the mail If you have any lab test that is abnormal or we need to change your treatment, we will call you to review the results.   Testing/Procedures: None ordered   Follow-Up: At [facility], you and your health needs are our priority.  As part of our continuing mission to provide you with exceptional heart care, we have created designated Provider Care Teams.  These Care Teams include your primary Cardiologist (physician) and Advanced Practice Providers (APPs -  Physician Assistants and Nurse Practitioners) who all work together to provide you with the care you need, when you need it.  We recommend signing up for the patient portal called "MyChart".  Sign up information is provided on this After Visit Summary.  MyChart is used to connect with patients for Virtual Visits (Telemedicine).  Patients are able to view lab/test results, encounter notes, upcoming appointments, etc.  Non-urgent messages can be sent to your provider as well.   To learn more about what you can do with MyChart, go to [email or web address].    Your next appointment:   12 month(s)  The format for your next appointment:   In Person  Provider:   You may see Larae Grooms, MD or one of the following Advanced Practice Providers on your designated Care Team:    Melina Copa, PA-C  Ermalinda Barrios, PA-C  Other Instructions  Low-Sodium Eating Plan Sodium, which is an element that makes up salt, helps you maintain a healthy  balance of fluids in your body. Too much sodium can increase your blood pressure and cause fluid and waste to be held in your body. Your health care provider or dietitian may recommend following this plan if you have high blood pressure (hypertension), kidney disease, liver disease, or heart failure. Eating less sodium can help lower your blood pressure, reduce swelling, and protect your heart, liver, and kidneys. What are tips for following this plan? General guidelines Most people on this plan should limit their sodium intake to 1,500-2,000 mg (milligrams) of sodium each day. Reading food labels  The Nutrition Facts label lists the amount of sodium in one serving of the food. If you eat more than one serving, you must multiply the listed amount of sodium by the number of servings. Choose foods with less than 140 mg of sodium per serving. Avoid foods with 300 mg of sodium or more per serving. Shopping Look for lower-sodium products, often labeled as "low-sodium" or "no salt added." Always check the sodium content even if foods are labeled as "unsalted" or "no salt added". Buy fresh foods. Avoid canned foods and premade or frozen meals. Avoid canned, cured, or processed meats Buy breads that have less than 80 mg of sodium per slice. Cooking Eat more home-cooked food and less restaurant, buffet, and fast food. Avoid adding salt when cooking. Use salt-free seasonings or herbs instead of table salt or sea salt. Check with your health care provider or pharmacist before using salt substitutes. Cook with plant-based oils, such as canola, sunflower,  or olive oil. Meal planning When eating at a restaurant, ask that your food be prepared with less salt or no salt, if possible. Avoid foods that contain MSG (monosodium glutamate). MSG is sometimes added to [place] food, bouillon, and some canned foods. What foods are recommended? The items listed may not be a complete list. Talk with your dietitian  about what dietary choices are best for you. Grains Low-sodium cereals, including oats, puffed wheat and rice, and shredded wheat. Low-sodium crackers. Unsalted rice. Unsalted pasta. Low-sodium bread. Whole-grain breads and whole-grain pasta. Vegetables Fresh or frozen vegetables. "No salt added" canned vegetables. "No salt added" tomato sauce and paste. Low-sodium or reduced-sodium tomato and vegetable juice. Fruits Fresh, frozen, or canned fruit. Fruit juice. Meats and other protein foods Fresh or frozen (no salt added) meat, poultry, seafood, and fish. Low-sodium canned tuna and salmon. Unsalted nuts. Dried peas, beans, and lentils without added salt. Unsalted canned beans. Eggs. Unsalted nut butters. Dairy Milk. Soy milk. Cheese that is naturally low in sodium, such as ricotta cheese, fresh mozzarella, or Swiss cheese Low-sodium or reduced-sodium cheese. Cream cheese. Yogurt. Fats and oils Unsalted butter. Unsalted margarine with no trans fat. Vegetable oils such as canola or olive oils. Seasonings and other foods Fresh and dried herbs and spices. Salt-free seasonings. Low-sodium mustard and ketchup. Sodium-free salad dressing. Sodium-free light mayonnaise. Fresh or refrigerated horseradish. Lemon juice. Vinegar. Homemade, reduced-sodium, or low-sodium soups. Unsalted popcorn and pretzels. Low-salt or salt-free chips. What foods are not recommended? The items listed may not be a complete list. Talk with your dietitian about what dietary choices are best for you. Grains Instant hot cereals. Bread stuffing, pancake, and biscuit mixes. Croutons. Seasoned rice or pasta mixes. Noodle soup cups. Boxed or frozen macaroni and cheese. Regular salted crackers. Self-rising flour. Vegetables Sauerkraut, pickled vegetables, and relishes. Olives. [place] fries. Onion rings. Regular canned vegetables (not low-sodium or reduced-sodium). Regular canned tomato sauce and paste (not low-sodium or reduced-sodium).  Regular tomato and vegetable juice (not low-sodium or reduced-sodium). Frozen vegetables in sauces. Meats and other protein foods Meat or fish that is salted, canned, smoked, spiced, or pickled. Bacon, ham, sausage, hotdogs, corned beef, chipped beef, packaged lunch meats, salt pork, jerky, pickled herring, anchovies, regular canned tuna, sardines, salted nuts. Dairy Processed cheese and cheese spreads. Cheese curds. Blue cheese. Feta cheese. String cheese. Regular cottage cheese. Buttermilk. Canned milk. Fats and oils Salted butter. Regular margarine. Ghee. Bacon fat. Seasonings and other foods Onion salt, garlic salt, seasoned salt, table salt, and sea salt. Canned and packaged gravies. Worcestershire sauce. Tartar sauce. Barbecue sauce. Teriyaki sauce. Soy sauce, including reduced-sodium. Steak sauce. Fish sauce. Oyster sauce. Cocktail sauce. Horseradish that you find on the shelf. Regular ketchup and mustard. Meat flavorings and tenderizers. Bouillon cubes. Hot sauce and Tabasco sauce. Premade or packaged marinades. Premade or packaged taco seasonings. Relishes. Regular salad dressings. Salsa. Potato and tortilla chips. Corn chips and puffs. Salted popcorn and pretzels. Canned or dried soups. Pizza. Frozen entrees and pot pies. Summary Eating less sodium can help lower your blood pressure, reduce swelling, and protect your heart, liver, and kidneys. Most people on this plan should limit their sodium intake to 1,500-2,000 mg (milligrams) of sodium each day. Canned, boxed, and frozen foods are high in sodium. Restaurant foods, fast foods, and pizza are also very high in sodium. You also get sodium by adding salt to food. Try to cook at home, eat more fresh fruits and vegetables, and eat less fast food, canned,  processed, or prepared foods. This information is not intended to replace advice given to you by your health care provider. Make sure you discuss any questions you have with your health care  provider. Document Revised: 09/07/2017 Document Reviewed: 09/18/2016 Elsevier Patient Education  [place].  High-Fiber Diet Fiber, also called dietary fiber, is a type of carbohydrate that is found in fruits, vegetables, whole grains, and beans. A high-fiber diet can have many health benefits. Your health care provider may recommend a high-fiber diet to help:  Prevent constipation. Fiber can make your bowel movements more regular.  Lower your cholesterol.  Relieve the following conditions: ? Swelling of veins in the anus (hemorrhoids). ? Swelling and irritation (inflammation) of specific areas of the digestive tract (uncomplicated diverticulosis). ? A problem of the large intestine (colon) that sometimes causes pain and diarrhea (irritable bowel syndrome, IBS).  Prevent overeating as part of a weight-loss plan.  Prevent heart disease, type 2 diabetes, and certain cancers. What is my plan? The recommended daily fiber intake in grams (g) includes:  38 g for men age 58 or younger.  30 g for men over age 41.  44 g for women age 84 or younger.  21 g for women over age 31. You can get the recommended daily intake of dietary fiber by:  Eating a variety of fruits, vegetables, grains, and beans.  Taking a fiber supplement, if it is not possible to get enough fiber through your diet. What do I need to know about a high-fiber diet?  It is better to get fiber through food sources rather than from fiber supplements. There is not a lot of research about how effective supplements are.  Always check the fiber content on the nutrition facts label of any prepackaged food. Look for foods that contain 5 g of fiber or more per serving.  Talk with a diet and nutrition specialist (dietitian) if you have questions about specific foods that are recommended or not recommended for your medical condition, especially if those foods are not listed below.  Gradually increase how much fiber you  consume. If you increase your intake of dietary fiber too quickly, you may have bloating, cramping, or gas.  Drink plenty of water. Water helps you to digest fiber. What are tips for following this plan?  Eat a wide variety of high-fiber foods.  Make sure that half of the grains that you eat each day are whole grains.  Eat breads and cereals that are made with whole-grain flour instead of refined flour or white flour.  Eat brown rice, bulgur wheat, or millet instead of white rice.  Start the day with a breakfast that is high in fiber, such as a cereal that contains 5 g of fiber or more per serving.  Use beans in place of meat in soups, salads, and pasta dishes.  Eat high-fiber snacks, such as berries, raw vegetables, nuts, and popcorn.  Choose whole fruits and vegetables instead of processed forms like juice or sauce. What foods can I eat?  Fruits Berries. Pears. Apples. Oranges. Avocado. Prunes and raisins. Dried figs. Vegetables Sweet potatoes. Spinach. Kale. Artichokes. Cabbage. Broccoli. Cauliflower. Green peas. Carrots. Squash. Grains Whole-grain breads. Multigrain cereal. Oats and oatmeal. Brown rice. Barley. Bulgur wheat. [place]. Quinoa. Bran muffins. Popcorn. Rye wafer crackers. Meats and other proteins [place], kidney, and pinto beans. Soybeans. Split peas. Lentils. Nuts and seeds. Dairy Fiber-fortified yogurt. Beverages Fiber-fortified soy milk. Fiber-fortified orange juice. Other foods Fiber bars. The items listed above  may not be a complete list of recommended foods and beverages. Contact a dietitian for more options. What foods are not recommended? Fruits Fruit juice. Cooked, strained fruit. Vegetables Fried potatoes. Canned vegetables. Well-cooked vegetables. Grains White bread. Pasta made with refined flour. White rice. Meats and other proteins Fatty cuts of meat. Fried chicken or fried fish. Dairy Milk. Yogurt. Cream cheese. Sour cream. Fats and  oils Butters. Beverages Soft drinks. Other foods Cakes and pastries. The items listed above may not be a complete list of foods and beverages to avoid. Contact a dietitian for more information. Summary  Fiber is a type of carbohydrate. It is found in fruits, vegetables, whole grains, and beans.  There are many health benefits of eating a high-fiber diet, such as preventing constipation, lowering blood cholesterol, helping with weight loss, and reducing your risk of heart disease, diabetes, and certain cancers.  Gradually increase your intake of fiber. Increasing too fast can result in cramping, bloating, and gas. Drink plenty of water while you increase your fiber.  The best sources of fiber include whole fruits and vegetables, whole grains, nuts, seeds, and beans. This information is not intended to replace advice given to you by your health care provider. Make sure you discuss any questions you have with your health care provider. Document Revised: 07/30/2017 Document Reviewed: 07/30/2017 Elsevier Patient Education  2020 [employer].

## 2020-03-09 ENCOUNTER — Other Ambulatory Visit: Payer: Self-pay | Admitting: *Deleted

## 2020-03-09 ENCOUNTER — Other Ambulatory Visit: Payer: Self-pay | Admitting: Family Medicine

## 2020-03-09 MED ORDER — FUROSEMIDE 40 MG PO TABS: 40.0000 mg | ORAL_TABLET | Freq: Every day | ORAL | 0 refills | Status: DC

## 2020-03-23 ENCOUNTER — Encounter: Payer: Self-pay | Admitting: Family Medicine

## 2020-03-24 ENCOUNTER — Other Ambulatory Visit: Payer: Self-pay

## 2020-03-24 DIAGNOSIS — E785 Hyperlipidemia, unspecified: Secondary | ICD-10-CM

## 2020-03-24 DIAGNOSIS — I10 Essential (primary) hypertension: Secondary | ICD-10-CM

## 2020-04-06 LAB — CBC
HCT: 37.5 % (ref 35.0–45.0)
Hemoglobin: 11.9 g/dL (ref 11.7–15.5)
MCH: 28.4 pg (ref 27.0–33.0)
MCHC: 31.7 g/dL — ABNORMAL LOW (ref 32.0–36.0)
MCV: 89.5 fL (ref 80.0–100.0)
MPV: 10.6 fL (ref 7.5–12.5)
Platelets: 225 10*3/uL (ref 140–400)
RBC: 4.19 10*6/uL (ref 3.80–5.10)
RDW: 12.2 % (ref 11.0–15.0)
WBC: 3.8 10*3/uL (ref 3.8–10.8)

## 2020-04-06 LAB — LIPID PANEL
Cholesterol: 233 mg/dL — ABNORMAL HIGH (ref <200)
HDL: 53 mg/dL (ref >=50)
LDL Cholesterol (Calc): 162 mg/dL (calc) — ABNORMAL HIGH
Non-HDL Cholesterol (Calc): 180 mg/dL (calc) — ABNORMAL HIGH (ref <130)
Total CHOL/HDL Ratio: 4.4 (calc) (ref <5.0)
Triglycerides: 79 mg/dL (ref <150)

## 2020-04-06 LAB — COMPLETE METABOLIC PANEL WITH GFR
AG Ratio: 1.4 (calc) (ref 1.0–2.5)
ALT: 32 U/L — ABNORMAL HIGH (ref 6–29)
AST: 32 U/L (ref 10–35)
Albumin: 4.4 g/dL (ref 3.6–5.1)
Alkaline phosphatase (APISO): 101 U/L (ref 37–153)
BUN: 18 mg/dL (ref 7–25)
CO2: 32 mmol/L (ref 20–32)
Calcium: 9.7 mg/dL (ref 8.6–10.4)
Chloride: 103 mmol/L (ref 98–110)
Creat: 0.91 mg/dL (ref 0.50–1.05)
GFR, Est African American: 81 mL/min/{1.73_m2} (ref >=60)
GFR, Est Non African American: 70 mL/min/{1.73_m2} (ref >=60)
Globulin: 3.1 g/dL (calc) (ref 1.9–3.7)
Glucose, Bld: 91 mg/dL (ref 65–99)
Potassium: 4.1 mmol/L (ref 3.5–5.3)
Sodium: 141 mmol/L (ref 135–146)
Total Bilirubin: 0.6 mg/dL (ref 0.2–1.2)
Total Protein: 7.5 g/dL (ref 6.1–8.1)

## 2020-04-06 LAB — TSH: TSH: 1.5 mIU/L (ref 0.40–4.50)

## 2020-04-19 ENCOUNTER — Ambulatory Visit (INDEPENDENT_AMBULATORY_CARE_PROVIDER_SITE_OTHER): Payer: 59 | Admitting: Family Medicine

## 2020-04-19 ENCOUNTER — Encounter: Payer: Self-pay | Admitting: Family Medicine

## 2020-04-19 ENCOUNTER — Other Ambulatory Visit (HOSPITAL_COMMUNITY)
Admission: RE | Admit: 2020-04-19 | Discharge: 2020-04-19 | Disposition: A | Discharge status: Home / Self Care | Payer: 59 | Source: Ambulatory Visit | Attending: Family Medicine | Admitting: Family Medicine

## 2020-04-19 ENCOUNTER — Other Ambulatory Visit: Payer: Self-pay

## 2020-04-19 VITALS — BP 128/85 | HR 83 | Resp 16 | Ht 69.0 in | Wt 217.1 lb

## 2020-04-19 DIAGNOSIS — Z124 Encounter for screening for malignant neoplasm of cervix: Secondary | ICD-10-CM | POA: Insufficient documentation

## 2020-04-19 DIAGNOSIS — Z0000 Encounter for general adult medical examination without abnormal findings: Secondary | ICD-10-CM

## 2020-04-19 DIAGNOSIS — E669 Obesity, unspecified: Secondary | ICD-10-CM

## 2020-04-19 DIAGNOSIS — Z1231 Encounter for screening mammogram for malignant neoplasm of breast: Secondary | ICD-10-CM | POA: Diagnosis not present

## 2020-04-19 DIAGNOSIS — Z23 Encounter for immunization: Secondary | ICD-10-CM | POA: Diagnosis not present

## 2020-04-19 NOTE — Progress Notes (Signed)
    Allison Hart     MRN: [id number]      DOB: 05/06/1961  HPI: Patient is in for annual physical exam. No other health concerns are expressed or addressed at the visit. Recent labs, if available are reviewed. Immunization is reviewed , and  updated if needed.   PE: Pleasant  female, alert and oriented x 3, in no cardio-pulmonary distress. Afebrile. HEENT No facial trauma or asymetry. Sinuses non tender.  Extra occullar muscles intact.. External ears normal, . Neck: supple, no adenopathy,JVD or thyromegaly.No bruits.  Chest: Clear to ascultation bilaterally.No crackles or wheezes. Non tender to palpation  Breast: No asymetry,no masses or lumps. No tenderness. No nipple discharge or inversion. No axillary or supraclavicular adenopathy  Cardiovascular system; Heart sounds normal,  S1 and  S2 ,no S3.  No murmur, or thrill. Apical beat not displaced Peripheral pulses normal.  Abdomen: Soft, non tender, no organomegaly or masses. No bruits. Bowel sounds normal. No guarding, tenderness or rebound.   GU: External genitalia normal female genitalia , normal female distribution of hair. No lesions. Urethral meatus normal in size, no  Prolapse, no lesions visibly  Present. Bladder non tender. Vagina pink and moist , with no visible lesions , discharge present . Adequate pelvic support no  cystocele or rectocele noted Cervix pink and appears healthy, no lesions or ulcerations noted, no discharge noted from os Uterus normal size, no adnexal masses, no cervical motion or adnexal tenderness.   Musculoskeletal exam: Full ROM of spine, hips , shoulders and knees. No deformity ,swelling or crepitus noted. No muscle wasting or atrophy.   Neurologic: Cranial nerves 2 to 12 intact. Power, tone ,sensation and reflexes normal throughout. No disturbance in gait. No tremor.  Skin: Intact, no ulceration, erythema , scaling or rash noted. Pigmentation normal  throughout  Psych; Normal mood and affect. Judgement and concentration normal   Assessment & Plan:  Annual physical exam Annual exam as documented. Counseling done  re healthy lifestyle involving commitment to 150 minutes exercise per week, heart healthy diet, and attaining healthy weight.The importance of adequate sleep also discussed. Regular seat belt use and home safety, is also discussed. Changes in health habits are decided on by the patient with goals and time frames  set for achieving them. Immunization and cancer screening needs are specifically addressed at this visit.   Need for shingles vaccine After obtaining informed consent, the vaccine is  administered , with no adverse effect noted at the time of administration.   Obesity (BMI 30.0-34.9)  Patient re-educated about  the importance of commitment to a  minimum of 150 minutes of exercise per week as able.  The importance of healthy food choices with portion control discussed, as well as eating regularly and within a 12 hour window most days. The need to choose "clean , green" food 50 to 75% of the time is discussed, as well as to make water the primary drink and set a goal of 64 ounces water daily.    Weight /BMI 04/19/2020 02/02/2020 10/13/2019  WEIGHT 217 lb 1.9 oz 221 lb 232 lb 0.6 oz  HEIGHT 5\' 9"  5\' 9"  5\' 9"   BMI 32.06 kg/m2 32.64 kg/m2 34.27 kg/m2

## 2020-04-19 NOTE — Patient Instructions (Addendum)
F/u in office with mD in 5 months, and shingrix # 2 , call if you need me before  Mammogram is due in October and will be scheduled at checkout.  Nurse visit for TdAP in 6  weeks   Labs are EXCELLENT , except for cholesterol " total" and " bad" whiuch increases ty your risk of heart disease, so please reduce fresh fresh to lose and fruit and reduce processed foods fried and fatty foods.  This includes all bottles red meat cheese and egg yolks.  Shingles #1 vaccine is being given today and you will get your second vaccine at your visit in 5 months.  You have lost 13 pounds in the last 1 year which is excellent.  Keep up the great work.  This will only improve your health.  It is important that you exercise regularly at least 30 minutes 5 times a week. If you develop chest pain, have severe difficulty breathing, or feel very tired, stop exercising immediately and seek medical attention  Think about what you will eat, plan ahead. Choose " clean, green, fresh or frozen" over canned, processed or packaged foods which are more sugary, salty and fatty. 70 to 75% of food eaten should be vegetables and fruit. Three meals at set times with snacks allowed between meals, but they must be fruit or vegetables. Aim to eat over a 12 hour period , example 7 am to 7 pm, and STOP after  your last meal of the day. Drink water,generally about 64 ounces per day, no other drink is as healthy. Fruit juice is best enjoyed in a healthy way, by EATING the fruit. Thanks for choosing [facility], we consider it a privelige to serve you.

## 2020-04-19 NOTE — Assessment & Plan Note (Signed)

## 2020-04-19 NOTE — Assessment & Plan Note (Signed)
  Patient re-educated about  the importance of commitment to a  minimum of 150 minutes of exercise per week as able.  The importance of healthy food choices with portion control discussed, as well as eating regularly and within a 12 hour window most days. The need to choose "clean , green" food 50 to 75% of the time is discussed, as well as to make water the primary drink and set a goal of 64 ounces water daily.    Weight /BMI 04/19/2020 02/02/2020 10/13/2019  WEIGHT 217 lb 1.9 oz 221 lb 232 lb 0.6 oz  HEIGHT 5\' 9"  5\' 9"  5\' 9"   BMI 32.06 kg/m2 32.64 kg/m2 34.27 kg/m2

## 2020-04-19 NOTE — Assessment & Plan Note (Signed)
After obtaining informed consent, the vaccine is  administered , with no adverse effect noted at the time of administration.  

## 2020-04-20 DIAGNOSIS — Z23 Encounter for immunization: Secondary | ICD-10-CM | POA: Diagnosis not present

## 2020-04-23 LAB — CYTOLOGY - PAP
Comment: NEGATIVE
Diagnosis: NEGATIVE
High risk HPV: NEGATIVE

## 2020-06-01 ENCOUNTER — Ambulatory Visit (INDEPENDENT_AMBULATORY_CARE_PROVIDER_SITE_OTHER): Payer: 59

## 2020-06-01 ENCOUNTER — Other Ambulatory Visit: Payer: Self-pay

## 2020-06-01 DIAGNOSIS — Z23 Encounter for immunization: Secondary | ICD-10-CM

## 2020-06-07 ENCOUNTER — Other Ambulatory Visit: Payer: Self-pay | Admitting: Family Medicine

## 2020-06-09 ENCOUNTER — Other Ambulatory Visit: Payer: Self-pay

## 2020-06-09 MED ORDER — FUROSEMIDE 40 MG PO TABS
40.0000 mg | ORAL_TABLET | Freq: Every day | ORAL | 0 refills | Status: DC
Start: 2020-06-09 — End: 2020-09-22

## 2020-07-19 ENCOUNTER — Ambulatory Visit (HOSPITAL_COMMUNITY): Admission: RE | Admit: 2020-07-19 | Payer: 59 | Source: Ambulatory Visit

## 2020-07-20 ENCOUNTER — Encounter: Payer: Self-pay | Admitting: Family Medicine

## 2020-07-21 ENCOUNTER — Other Ambulatory Visit: Payer: Self-pay

## 2020-07-21 ENCOUNTER — Telehealth (INDEPENDENT_AMBULATORY_CARE_PROVIDER_SITE_OTHER): Payer: 59 | Admitting: Internal Medicine

## 2020-07-21 ENCOUNTER — Encounter: Payer: Self-pay | Admitting: Internal Medicine

## 2020-07-21 VITALS — BP 128/85 | Ht 69.0 in | Wt 217.0 lb

## 2020-07-21 DIAGNOSIS — L304 Erythema intertrigo: Secondary | ICD-10-CM | POA: Diagnosis not present

## 2020-07-21 MED ORDER — KETOCONAZOLE 2 % EX CREA: 1.0000 "application " | TOPICAL_CREAM | Freq: Every day | CUTANEOUS | 1 refills | Status: DC

## 2020-07-21 NOTE — Progress Notes (Signed)
Virtual Visit via Telephone Note   This visit type was conducted due to national recommendations for restrictions regarding the COVID-19 Pandemic (e.g. social distancing) in an effort to limit this patient's exposure and mitigate transmission in our community.  Due to her co-morbid illnesses, this patient is at least at moderate risk for complications without adequate follow up.  This format is felt to be most appropriate for this patient at this time.  The patient did not have access to video technology/had technical difficulties with video requiring transitioning to audio format only (telephone).  All issues noted in this document were discussed and addressed.  No physical exam could be performed with this format.  Evaluation Performed:  Follow-up visit  Date:  07/21/2020   ID:  Allison Hart, DOB 1960-10-21, MRN [id number]  Patient Location: Home Provider Location: Office/Clinic  Location of Patient: Home Location of Provider: Telehealth Consent was obtain for visit to be over via telehealth. I verified that I am speaking with the correct person using two identifiers.  PCP:  Fayrene Helper, MD   Chief Complaint:  Rash underneath the breast  History of Present Illness:    Allison Hart is a 59 y.o. female with past medical history of hypertension, hyperlipidemia and obesity has a televisit for evaluation of rash underneath the right breast.  Patient had sent the picture of the rash earlier today.  She has noticed a rash for last 2 weeks.  She tried to put mupirocin cream over it, but states that it has not helped her.  She denies pain, swelling, any discharge or excessive itching in the area.  She denies fever, chills or breast area tenderness or discharge from the breast.  She was advised to keep the area dry and clean and apply lotion.  The patient does not have symptoms concerning for COVID-19 infection (fever, chills, cough, or new shortness of breath).   Past  Medical, Surgical, Social History, Allergies, and Medications have been Reviewed.  Past Medical History:  Diagnosis Date   Anxiety    Arthritis    Essential hypertension, benign    GERD (gastroesophageal reflux disease)    Herpes    History of kidney stones    Murmur    Obesity    Sinusitis    Urinary frequency    Past Surgical History:  Procedure Laterality Date   BIOPSY  08/29/2018   Procedure: BIOPSY;  Surgeon: Daneil Dolin, MD;  [facility];  Service: Endoscopy;;  gastric   COLONOSCOPY  11/13/2012   Procedure: COLONOSCOPY;  Surgeon: Rogene Houston, MD;  [facility];  Service: Endoscopy;  Laterality: N/A;  930   embolization of uterine artery X 2     ESOPHAGOGASTRODUODENOSCOPY (EGD) WITH PROPOFOL N/A 08/29/2018   Procedure: ESOPHAGOGASTRODUODENOSCOPY (EGD) WITH PROPOFOL;  Surgeon: Daneil Dolin, MD;  [facility];  Service: Endoscopy;  Laterality: N/A;  3:00pm   INGUINAL LYMPH NODE BIOPSY Right 04/14/2016   Procedure: RIGHT INGUINAL LYMPH NODE BIOPSY;  Surgeon: Aviva Signs, MD;  [facility];  Service: General;  Laterality: Right;   IR ANGIO INTRA EXTRACRAN SEL COM CAROTID INNOMINATE BILAT MOD SED  02/12/2017   IR ANGIO VERTEBRAL SEL VERTEBRAL BILAT MOD SED  02/12/2017   IR GENERIC HISTORICAL  04/27/2016   IR RADIOLOGIST EVAL & MGMT 04/27/2016 Jacqulynn Cadet, MD [facility]   IR GENERIC HISTORICAL  12/05/2016   IR RADIOLOGIST EVAL & MGMT 12/05/2016 Heath  Laurence Ferrari, MD [facility]   IR RADIOLOGIST EVAL & MGMT  01/22/2017   IR RADIOLOGIST EVAL & MGMT  08/06/2018   Left inguinal hernia repair and endometriosis  2007   No definitive records    Left parotid bx benign  2005   RADIOLOGY WITH ANESTHESIA N/A 01/18/2016   Procedure: RADIOLOGY WITH ANESTHESIA;  Surgeon: Luanne Bras, MD;  Location: [place];  Service: Radiology;  Laterality: N/A;   RADIOLOGY WITH ANESTHESIA N/A 02/12/2017   Procedure: Cherie Dark  EMBOLIZATION;  Surgeon: Luanne Bras, MD;  Location: [place];  Service: Radiology;  Laterality: N/A;   renal artery aneurysm repair right Right 01/6269   UMBILICAL HERNIA REPAIR  infancy      Current Meds  Medication Sig   acyclovir (ZOVIRAX) 800 MG tablet Take 800 mg by mouth 5 (five) times daily.   amLODipine (NORVASC) 10 MG tablet Take 1 tablet by mouth once daily   aspirin EC 81 MG tablet Take 1 tablet (81 mg total) by mouth daily.   cholecalciferol (VITAMIN D) 1000 units tablet Take 1,000 Units by mouth daily.   fluticasone (FLONASE) 50 MCG/ACT nasal spray Place 2 sprays into both nostrils daily.   furosemide (LASIX) 40 MG tablet Take 1 tablet (40 mg total) by mouth daily.   loratadine (EQ ALLERGY RELIEF) 10 MG tablet Take 1 tablet by mouth once daily   potassium chloride SA (KLOR-CON) 20 MEQ tablet Take 1 tablet by mouth twice daily   Pyridoxine HCl (VITAMIN B-6 PO) Take 1 tablet by mouth daily.   spironolactone (ALDACTONE) 25 MG tablet Take 1 tablet by mouth once daily     Allergies:   Versed [midazolam] and Sulfa antibiotics   ROS:   Please see the history of present illness.     All other systems reviewed and are negative.   Labs/Other Tests and Data Reviewed:    Recent Labs: 04/06/2020: ALT 32; BUN 18; Creat 0.91; Hemoglobin 11.9; Platelets 225; Potassium 4.1; Sodium 141; TSH 1.50   Recent Lipid Panel Lab Results  Component Value Date/Time   CHOL 233 (H) 04/06/2020 09:50 AM   TRIG 79 04/06/2020 09:50 AM   HDL 53 04/06/2020 09:50 AM   CHOLHDL 4.4 04/06/2020 09:50 AM   LDLCALC 162 (H) 04/06/2020 09:50 AM    Wt Readings from Last 3 Encounters:  07/21/20 217 lb (98.4 kg)  04/19/20 217 lb 1.9 oz (98.5 kg)  02/02/20 221 lb (100.2 kg)     Objective:    Vital Signs:  BP 128/85    Ht 5\' 9"  (1.753 m)    Wt 217 lb (98.4 kg)    LMP  (LMP Unknown)    BMI 32.05 kg/m    VITAL SIGNS:  reviewed  ASSESSMENT & PLAN:    Intertrigo Whitish patch noticed on  erythematous base Common area for fungal infection Ketoconazole cream Advised to keep the area clean and dry Advised to get medical attention if she notices swelling or lump or discharge from the breast area  Time:   Today, I have spent 9 minutes with the patient with telehealth technology discussing the above problems.     Medication Adjustments/Labs and Tests Ordered: Current medicines are reviewed at length with the patient today.  Concerns regarding medicines are outlined above.   Tests Ordered: No orders of the defined types were placed in this encounter.   Medication Changes: Meds ordered this encounter  Medications   ketoconazole (NIZORAL) 2 % cream    Sig: Apply 1  application topically daily.    Dispense:  15 g    Refill:  1     Note: This dictation was prepared with Dragon dictation along with smaller phrase technology. Similar sounding words can be transcribed inadequately or may not be corrected upon review. Any transcriptional errors that result from this process are unintentional.      Disposition:  Follow up  Signed, Lindell Spar, MD  07/21/2020 5:13 PM     [facility]

## 2020-07-21 NOTE — Patient Instructions (Addendum)
Please apply Ketoconazole cream once daily to the affected area. Please keep the area dry and clean.  Intertrigo Intertrigo is skin irritation (inflammation) that happens in warm, moist areas of the body. The irritation can cause a rash and make skin raw and itchy. The rash is usually pink or red. It happens mostly between folds of skin or where skin rubs together, such as:  Between the toes.  In the armpits.  In the groin area.  Under the belly.  Under the breasts.  Around the butt area. This condition is not passed from person to person (is not contagious). What are the causes?  Heat, moisture, rubbing, and not enough air movement.  The condition can be made worse by: ? Sweat. ? Bacteria. ? A fungus, such as yeast. What increases the risk?  Moisture in your skin folds.  You are more likely to develop this condition if you: ? Have diabetes. ? Are overweight. ? Are not able to move around. ? Live in a warm and moist climate. ? Wear splints, braces, or other medical devices. ? Are not able to control your pee (urine) or poop (stool). What are the signs or symptoms?  A pink or red skin rash in the skin fold or near the skin fold.  Raw or scaly skin.  Itching.  A burning feeling.  Bleeding.  Leaking fluid.  A bad smell. How is this treated?  Cleaning and drying your skin.  Taking an antibiotic medicine or using an antibiotic skin cream for a bacterial infection.  Using an antifungal cream on your skin or taking pills for an infection that was caused by a fungus, such as yeast.  Using a steroid ointment to stop the itching and irritation.  Separating the skin fold with a clean cotton cloth to absorb moisture and allow air to flow into the area. Follow these instructions at home:  Keep the affected area clean and dry.  Do not scratch your skin.  Stay cool as much as you can. Use an air conditioner or a fan, if you have one.  Apply over-the-counter and  prescription medicines only as told by your doctor.  If you were prescribed an antibiotic medicine, use it as told by your doctor. Do not stop using the antibiotic even if your condition starts to get better.  Keep all follow-up visits as told by your doctor. This is important. How is this prevented?   Stay at a healthy weight.  Take care of your feet. This is very important if you have diabetes. You should: ? Wear shoes that fit well. ? Keep your feet dry. ? Wear clean cotton or wool socks.  Protect the skin in your groin and butt area as told by your doctor. To do this: ? Follow a regular cleaning routine. ? Use creams, powders, or ointments that protect your skin. ? Change protection pads often.  Do not wear tight clothes. Wear clothes that: ? Are loose. ? Take moisture away from your body. ? Are made of cotton.  Wear a bra that gives good support, if needed.  Shower and dry yourself well after being active. Use a hair dryer on a cool setting to dry between skin folds.  Keep your blood sugar under control if you have diabetes. Contact a doctor if:  Your symptoms do not get better with treatment.  Your symptoms get worse or they spread.  You notice more redness and warmth.  You have a fever. Summary  Intertrigo is  skin irritation that occurs when folds of skin rub together.  This condition is caused by heat, moisture, and rubbing.  This condition may be treated by cleaning and drying your skin and with medicines.  Apply over-the-counter and prescription medicines only as told by your doctor.  Keep all follow-up visits as told by your doctor. This is important. This information is not intended to replace advice given to you by your health care provider. Make sure you discuss any questions you have with your health care provider. Document Revised: 07/04/2018 Document Reviewed: 07/04/2018 Elsevier Patient Education  2020 [employer].

## 2020-07-22 ENCOUNTER — Other Ambulatory Visit: Payer: Self-pay | Admitting: Interventional Radiology

## 2020-07-22 DIAGNOSIS — I722 Aneurysm of renal artery: Secondary | ICD-10-CM

## 2020-07-27 ENCOUNTER — Other Ambulatory Visit: Payer: Self-pay | Admitting: Interventional Radiology

## 2020-07-27 ENCOUNTER — Other Ambulatory Visit: Payer: Self-pay

## 2020-07-27 DIAGNOSIS — I722 Aneurysm of renal artery: Secondary | ICD-10-CM

## 2020-08-16 ENCOUNTER — Ambulatory Visit (HOSPITAL_COMMUNITY)
Admission: RE | Admit: 2020-08-16 | Discharge: 2020-08-16 | Disposition: A | Discharge status: Home / Self Care | Payer: 59 | Source: Ambulatory Visit | Attending: Interventional Radiology | Admitting: Interventional Radiology

## 2020-08-16 ENCOUNTER — Other Ambulatory Visit: Payer: Self-pay

## 2020-08-16 DIAGNOSIS — I722 Aneurysm of renal artery: Secondary | ICD-10-CM | POA: Diagnosis not present

## 2020-08-16 LAB — POCT I-STAT CREATININE: Creatinine, Ser: 1.1 mg/dL — ABNORMAL HIGH (ref 0.44–1.00)

## 2020-08-16 MED ORDER — IOHEXOL 350 MG/ML SOLN
100.0000 mL | Freq: Once | INTRAVENOUS | Status: AC | PRN
  Administered 2020-08-16: 100 mL via INTRAVENOUS

## 2020-08-19 ENCOUNTER — Other Ambulatory Visit: Payer: Self-pay

## 2020-08-19 ENCOUNTER — Encounter: Payer: Self-pay | Admitting: *Deleted

## 2020-08-19 ENCOUNTER — Ambulatory Visit
Admission: RE | Admit: 2020-08-19 | Discharge: 2020-08-19 | Disposition: A | Discharge status: Home / Self Care | Payer: 59 | Source: Ambulatory Visit | Attending: Interventional Radiology | Admitting: Interventional Radiology

## 2020-08-19 DIAGNOSIS — I722 Aneurysm of renal artery: Secondary | ICD-10-CM

## 2020-08-19 HISTORY — PX: IR RADIOLOGIST EVAL & MGMT: IMG5224

## 2020-08-19 NOTE — Progress Notes (Addendum)
Chief Complaint: Patient was consulted remotely today (TeleHealth) for renal artery aneurysm at the request of Ramonica Grigg K.    Referring Physician(s): Flannery Cavallero K  History of Present Illness: Allison Hart is a 59 y.o. 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 She underwent endovascular repair with placement of coaxial overlapping pipeline flow diverting stents in April 2017 and now presents for scheduled follow-up.  Medically, Allison Hart is asymptomatic. She denies flank pain, hematuria or dysuria.    Past Medical History:  Diagnosis Date  . Anxiety   . Arthritis   . Essential hypertension, benign   . GERD (gastroesophageal reflux disease)   . Herpes   . History of kidney stones   . Murmur   . Obesity   . Sinusitis   . Urinary frequency     Past Surgical History:  Procedure Laterality Date  . BIOPSY  08/29/2018   Procedure: BIOPSY;  Surgeon: Daneil Dolin, MD;  [facility];  Service: Endoscopy;;  gastric  . COLONOSCOPY  11/13/2012   Procedure: COLONOSCOPY;  Surgeon: Rogene Houston, MD;  [facility];  Service: Endoscopy;  Laterality: N/A;  930  . embolization of uterine artery X 2    . ESOPHAGOGASTRODUODENOSCOPY (EGD) WITH PROPOFOL N/A 08/29/2018   Procedure: ESOPHAGOGASTRODUODENOSCOPY (EGD) WITH PROPOFOL;  Surgeon: Daneil Dolin, MD;  [facility];  Service: Endoscopy;  Laterality: N/A;  3:00pm  . INGUINAL LYMPH NODE BIOPSY Right 04/14/2016   Procedure: RIGHT INGUINAL LYMPH NODE BIOPSY;  Surgeon: Aviva Signs, MD;  [facility];  Service: General;  Laterality: Right;  . IR ANGIO INTRA EXTRACRAN SEL COM CAROTID INNOMINATE BILAT MOD SED  02/12/2017  . IR ANGIO VERTEBRAL SEL VERTEBRAL BILAT MOD SED  02/12/2017  . IR GENERIC HISTORICAL  04/27/2016   IR RADIOLOGIST EVAL & MGMT 04/27/2016 Jacqulynn Cadet, MD [facility]  . IR GENERIC HISTORICAL  12/05/2016   IR RADIOLOGIST EVAL &  MGMT 12/05/2016 Jacqulynn Cadet, MD [facility]  . IR RADIOLOGIST EVAL & MGMT  01/22/2017  . IR RADIOLOGIST EVAL & MGMT  08/06/2018  . Left inguinal hernia repair and endometriosis  2007   No definitive records   . Left parotid bx benign  2005  . RADIOLOGY WITH ANESTHESIA N/A 01/18/2016   Procedure: RADIOLOGY WITH ANESTHESIA;  Surgeon: Luanne Bras, MD;  Location: [place];  Service: Radiology;  Laterality: N/A;  . RADIOLOGY WITH ANESTHESIA N/A 02/12/2017   Procedure: Cherie Dark EMBOLIZATION;  Surgeon: Luanne Bras, MD;  Location: [place];  Service: Radiology;  Laterality: N/A;  . renal artery aneurysm repair right Right 01/2016  . UMBILICAL HERNIA REPAIR  infancy     Allergies: Versed [midazolam] and Sulfa antibiotics  Medications: Prior to Admission medications   Medication Sig Start Date End Date Taking? Authorizing Provider  acyclovir (ZOVIRAX) 800 MG tablet Take 800 mg by mouth 5 (five) times daily.    [provider]  amLODipine (NORVASC) 10 MG tablet Take 1 tablet by mouth once daily 03/09/20   Fayrene Helper, MD  aspirin EC 81 MG tablet Take 1 tablet (81 mg total) by mouth daily. 07/30/18   Fayrene Helper, MD  cholecalciferol (VITAMIN D) 1000 units tablet Take 1,000 Units by mouth daily.    [provider]  fluticasone (FLONASE) 50 MCG/ACT nasal spray Place 2 sprays into both nostrils daily. 12/25/18   Sharion Balloon, FNP  furosemide (LASIX) 40  MG tablet Take 1 tablet (40 mg total) by mouth daily. 06/09/20   Fayrene Helper, MD  ketoconazole (NIZORAL) 2 % cream Apply 1 application topically daily. 07/21/20   Lindell Spar, MD  loratadine [facility] ALLERGY RELIEF) 10 MG tablet Take 1 tablet by mouth once daily 06/07/20   Fayrene Helper, MD  potassium chloride SA (KLOR-CON) 20 MEQ tablet Take 1 tablet by mouth twice daily 03/09/20   Fayrene Helper, MD  Pyridoxine HCl (VITAMIN B-6 PO) Take 1 tablet by mouth daily.    [provider]    spironolactone (ALDACTONE) 25 MG tablet Take 1 tablet by mouth once daily 06/07/20   Fayrene Helper, MD     Family History  Problem Relation Age of Onset  . Hypertension Mother   . Hyperlipidemia Mother   . Thyroid disease Mother   . Coronary artery disease Mother   . Irregular heart beat Mother   . Fibroids Sister   . Hypertension Sister   . Ulcerative colitis Son   . Colon cancer Neg Hx     Social History   Socioeconomic History  . Marital status: Married    Spouse name: Not on file  . Number of children: Not on file  . Years of education: Not on file  . Highest education level: Associate degree: occupational, Hotel manager, or vocational program  Occupational History  . Not on file  Tobacco Use  . Smoking status: Former Smoker    Packs/day: 0.25    Years: 5.00    Pack years: 1.25    Types: Cigarettes    Quit date: 1997    Years since quitting: 24.8  . Smokeless tobacco: Never Used  Vaping Use  . Vaping Use: Never used  Substance and Sexual Activity  . Alcohol use: Yes    Comment: rare  . Drug use: No  . Sexual activity: Yes    Birth control/protection: None  Other Topics Concern  . Not on file  Social History Narrative  . Not on file   Social Determinants of Health   Financial Resource Strain:   . Difficulty of Paying Living Expenses: Not on file  Food Insecurity:   . Worried About Charity fundraiser in the Last Year: Not on file  . Ran Out of Food in the Last Year: Not on file  Transportation Needs:   . Lack of Transportation (Medical): Not on file  . Lack of Transportation (Non-Medical): Not on file  Physical Activity:   . Days of Exercise per Week: Not on file  . Minutes of Exercise per Session: Not on file  Stress:   . Feeling of Stress : Not on file  Social Connections:   . Frequency of Communication with Friends and Family: Not on file  . Frequency of Social Gatherings with Friends and Family: Not on file  . Attends Religious Services: Not  on file  . Active Member of Clubs or Organizations: Not on file  . Attends Archivist Meetings: Not on file  . Marital Status: Not on file    Review of Systems  Review of Systems: A 12 point ROS discussed and pertinent positives are indicated in the HPI above.  All other systems are negative.  Physical Exam No direct physical exam was performed (except for noted visual exam findings with Video Visits).    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 She underwent endovascular repair with placement of coaxial overlapping pipeline flow diverting stents in 2017 and now presents for follow-up. EXAM: CT ANGIOGRAPHY ABDOMEN TECHNIQUE: Multidetector CT imaging of the abdomen was performed using the standard protocol during bolus administration of intravenous contrast. Multiplanar reconstructed images and MIPs were obtained and reviewed to evaluate the vascular anatomy. CONTRAST:  17mL OMNIPAQUE IOHEXOL 350 MG/ML SOLN COMPARISON:  Prior CT arteriogram of the abdomen 08/06/2018 FINDINGS: VASCULAR Aorta: Normal caliber aorta without aneurysm, dissection, vasculitis or significant stenosis. Celiac: Patent without evidence of aneurysm, dissection, vasculitis or significant stenosis. SMA: Patent without evidence of aneurysm, dissection, vasculitis or significant stenosis. Replaced right hepatic artery. Renals: Patent right-sided renal artery including the previously placed flow diverting stents. Complete thrombosis of the treated renal artery aneurysms. The largest portion measures 2.1 x 1.6 cm, slightly decreased compared to prior imaging. The more posterior moiety remains unchanged and a maximal diameter of 1.4 cm. Stable mild ischemic atrophy of the upper and lower poles of the right kidney. IMA: Patent without evidence of  aneurysm, dissection, vasculitis or significant stenosis. Inflow: Patent without evidence of aneurysm, dissection, vasculitis or significant stenosis. Veins: No obvious venous abnormality within the limitations of this arterial phase study. Review of the MIP images confirms the above findings. NON-VASCULAR Lower chest: No acute abnormality. Hepatobiliary: No focal liver abnormality is seen. No gallstones, gallbladder wall thickening, or biliary dilatation. Pancreas: Unremarkable. No pancreatic ductal dilatation or surrounding inflammatory changes. Spleen: Normal in size without focal abnormality. Adrenals/Urinary Tract: Normal adrenal glands. Stable simple cyst in the upper pole of the left kidney. Stable ischemic atrophy of the medial aspect of the upper and lower poles of the right kidney. Unremarkable visualized ureters. Stomach/Bowel: Stomach is within normal limits. Appendix appears normal. No evidence of bowel wall thickening, distention, or inflammatory changes. Lymphatic: No suspicious lymphadenopathy. Other: No abdominal wall hernia or abnormality. No abdominopelvic ascites. Musculoskeletal: No acute fracture or aggressive appearing lytic or blastic osseous lesion. L5-S1 degenerative disc disease. IMPRESSION: VASCULAR 1. Successful endovascular repair of complex bilobed right renal artery aneurysm. The excluded aneurysm is slightly decreased in size compared to prior imaging from October 2019. The flow diverting stents remain widely patent. NON-VASCULAR 1. Ancillary findings as above without significant interval change. Signed, Criselda Peaches, MD, [facility] Rad[facility] Electronically Signed   By: Jacqulynn Cadet M.D.   On: 08/16/2020 13:36    Labs:  CBC: Recent Labs    04/06/20 0950  WBC 3.8  HGB 11.9  HCT 37.5  PLT 225    COAGS: No results for input(s): INR, APTT in the last 8760 hours.  BMP: Recent Labs    04/06/20 0950  08/16/20 1216  NA 141  --   K 4.1  --   CL 103  --   CO2 32  --   GLUCOSE 91  --   BUN 18  --   CALCIUM 9.7  --   CREATININE 0.91 1.10*  GFRNONAA 70  --   GFRAA 81  --     LIVER FUNCTION TESTS: Recent Labs    04/06/20 0950  BILITOT 0.6  AST 32  ALT 32*  PROT 7.5    TUMOR MARKERS: No results for input(s): AFPTM, CEA, CA199, CHROMGRNA in the last 8760 hours.  Assessment and Plan:  Allison Hart continues to do very well.  We are  now 4.5 years status post flow diverting stent repair of her complex right renal artery aneurysm.  Her most recent CT arteriogram from 08/16/20 demonstrates a durable an excellent result.  Her aneurysm is stable to slightly decreased in size and demonstrates no evidence of arterial perfusion.  The pipeline stents remain patent.  We will continue routine surveillance.  1.)  Next CTA of the abdomen and pelvis and accompanying clinic visit in 2 years.    Electronically Signed: Criselda Peaches 08/19/2020, 1:16 PM   I spent a total of 15 Minutes in remote  clinical consultation, greater than 50% of which was counseling/coordinating care for renal artery aneurysm.    Visit type: Audio only (telephone). Audio (no video) only due to patient request. Alternative for in-person consultation at [facility], [address] [place], [place], [place]. This visit type was conducted due to national recommendations for restrictions regarding the COVID-19 Pandemic (e.g. social distancing).  This format is felt to be most appropriate for this patient at this time.  All issues noted in this document were discussed and addressed.

## 2020-08-24 ENCOUNTER — Other Ambulatory Visit: Payer: Self-pay | Admitting: Family Medicine

## 2020-09-17 ENCOUNTER — Encounter: Payer: Self-pay | Admitting: Family Medicine

## 2020-09-21 ENCOUNTER — Other Ambulatory Visit: Payer: Self-pay

## 2020-09-21 ENCOUNTER — Encounter: Payer: Self-pay | Admitting: Family Medicine

## 2020-09-21 ENCOUNTER — Ambulatory Visit (INDEPENDENT_AMBULATORY_CARE_PROVIDER_SITE_OTHER): Payer: 59 | Admitting: Family Medicine

## 2020-09-21 VITALS — BP 130/89 | HR 94 | Resp 16 | Ht 69.0 in | Wt 214.0 lb

## 2020-09-21 DIAGNOSIS — I10 Essential (primary) hypertension: Secondary | ICD-10-CM

## 2020-09-21 DIAGNOSIS — E669 Obesity, unspecified: Secondary | ICD-10-CM | POA: Diagnosis not present

## 2020-09-21 DIAGNOSIS — J3089 Other allergic rhinitis: Secondary | ICD-10-CM

## 2020-09-21 DIAGNOSIS — Z23 Encounter for immunization: Secondary | ICD-10-CM

## 2020-09-21 DIAGNOSIS — E785 Hyperlipidemia, unspecified: Secondary | ICD-10-CM

## 2020-09-21 NOTE — Patient Instructions (Signed)
Annual exam with mD in 6 months, call if you need me sooner  Shingrix #2 today  Please schedule appt for flu vaccine next week at 1 pm  Please schedule mammogram at 7 am or first time they start, pt needs to get to work for 8 am   No med changes  Congrats on weight l;oss  It is important that you exercise regularly at least 30 minutes 5 times a week. If you develop chest pain, have severe difficulty breathing, or feel very tired, stop exercising immediately and seek medical attention  Think about what you will eat, plan ahead. Choose " clean, green, fresh or frozen" over canned, processed or packaged foods which are more sugary, salty and fatty. 70 to 75% of food eaten should be vegetables and fruit. Three meals at set times with snacks allowed between meals, but they must be fruit or vegetables. Aim to eat over a 12 hour period , example 7 am to 7 pm, and STOP after  your last meal of the day. Drink water,generally about 64 ounces per day, no other drink is as healthy. Fruit juice is best enjoyed in a healthy way, by EATING the fruit. Thanks for choosing [facility], we consider it a privelige to serve you.  Best for 2022! Thanks for choosing [facility], we consider it a privelige to serve you.

## 2020-09-22 ENCOUNTER — Other Ambulatory Visit: Payer: Self-pay | Admitting: Family Medicine

## 2020-09-26 ENCOUNTER — Encounter: Payer: Self-pay | Admitting: Family Medicine

## 2020-09-26 NOTE — Assessment & Plan Note (Signed)
After obtaining informed consent, the vaccine is  administered , with no adverse effect noted at the time of administration.  

## 2020-09-26 NOTE — Assessment & Plan Note (Signed)
Controlled, no change in medication  

## 2020-09-26 NOTE — Progress Notes (Signed)
Allison Hart     MRN: [id number]      DOB: 22-Nov-1960   HPI Allison Hart is here for follow up and re-evaluation of chronic medical conditions, medication management and review of any available recent lab and radiology data.  Preventive health is updated, specifically  Cancer screening and Immunization.   Questions or concerns regarding consultations or procedures which the PT has had in the interim are  addressed. The PT denies any adverse reactions to current medications since the last visit.  There are no new concerns.  There are no specific complaints   ROS Denies recent fever or chills. Denies sinus pressure, nasal congestion, ear pain or sore throat. Denies chest congestion, productive cough or wheezing. Denies chest pains, palpitations and leg swelling Denies abdominal pain, nausea, vomiting,diarrhea or constipation.   Denies dysuria, frequency, hesitancy or incontinence. Denies joint pain, swelling and limitation in mobility. Denies headaches, seizures, numbness, or tingling. Denies depression, anxiety or insomnia. Denies skin break down or rash.   PE  BP 130/89   Pulse 94   Resp 16   Ht 5\' 9"  (1.753 m)   Wt 214 lb (97.1 kg)   LMP  (LMP Unknown)   SpO2 99%   BMI 31.60 kg/m   Patient alert and oriented and in no cardiopulmonary distress.  HEENT: No facial asymmetry, EOMI,     Neck supple .  Chest: Clear to auscultation bilaterally.  CVS: S1, S2 no murmurs, no S3.Regular rate.  ABD: Soft non tender.   Ext: No edema  MS: Adequate ROM spine, shoulders, hips and knees.  Skin: Intact, no ulcerations or rash noted.  Psych: Good eye contact, normal affect. Memory intact not anxious or depressed appearing.  CNS: CN 2-12 intact, power,  normal throughout.no focal deficits noted.   Assessment & Plan  Essential hypertension Controlled, no change in medication DASH diet and commitment to daily physical activity for a minimum of 30 minutes discussed and  encouraged, as a part of hypertension management. The importance of attaining a healthy weight is also discussed.  BP/Weight 09/21/2020 07/21/2020 04/19/2020 02/02/2020 10/13/2019 07/15/2019 7/82/9562  Systolic BP 130 865 784 696 295 284 132  Diastolic BP 89 85 85 88 84 80 82  Wt. (Lbs) 214 217 217.12 221 232.04 230.5 230.12  BMI 31.6 32.05 32.06 32.64 34.27 34.04 33.98       Obesity (BMI 30.0-34.9) Improved, she is applauded on this  Patient re-educated about  the importance of commitment to a  minimum of 150 minutes of exercise per week as able.  The importance of healthy food choices with portion control discussed, as well as eating regularly and within a 12 hour window most days. The need to choose "clean , green" food 50 to 75% of the time is discussed, as well as to make water the primary drink and set a goal of 64 ounces water daily.    Weight /BMI 09/21/2020 07/21/2020 04/19/2020  WEIGHT 214 lb 217 lb 217 lb 1.9 oz  HEIGHT 5\' 9"  5\' 9"  5\' 9"   BMI 31.6 kg/m2 32.05 kg/m2 32.06 kg/m2      Hyperlipidemia LDL goal <100 Hyperlipidemia:Low fat diet discussed and encouraged.   Lipid Panel  Lab Results  Component Value Date   CHOL 233 (H) 04/06/2020   HDL 53 04/06/2020   LDLCALC 162 (H) 04/06/2020   TRIG 79 04/06/2020   CHOLHDL 4.4 04/06/2020     Updated lab needed at/ before next visit.   Need for  shingles vaccine After obtaining informed consent, the vaccine is  administered , with no adverse effect noted at the time of administration.   Allergic rhinitis Controlled, no change in medication

## 2020-09-26 NOTE — Assessment & Plan Note (Signed)
Improved, she is applauded on this  Patient re-educated about  the importance of commitment to a  minimum of 150 minutes of exercise per week as able.  The importance of healthy food choices with portion control discussed, as well as eating regularly and within a 12 hour window most days. The need to choose "clean , green" food 50 to 75% of the time is discussed, as well as to make water the primary drink and set a goal of 64 ounces water daily.    Weight /BMI 09/21/2020 07/21/2020 04/19/2020  WEIGHT 214 lb 217 lb 217 lb 1.9 oz  HEIGHT 5\' 9"  5\' 9"  5\' 9"   BMI 31.6 kg/m2 32.05 kg/m2 32.06 kg/m2

## 2020-09-26 NOTE — Assessment & Plan Note (Signed)
Hyperlipidemia:Low fat diet discussed and encouraged. ? ? ?Lipid Panel  ?Lab Results  ?Component Value Date  ? CHOL 233 (H) 04/06/2020  ? HDL 53 04/06/2020  ? LDLCALC 162 (H) 04/06/2020  ? TRIG 79 04/06/2020  ? CHOLHDL 4.4 04/06/2020  ? ? ? ?Updated lab needed at/ before next visit. ? ?

## 2020-09-26 NOTE — Assessment & Plan Note (Signed)
Controlled, no change in medication DASH diet and commitment to daily physical activity for a minimum of 30 minutes discussed and encouraged, as a part of hypertension management. The importance of attaining a healthy weight is also discussed.  BP/Weight 09/21/2020 07/21/2020 04/19/2020 02/02/2020 10/13/2019 07/15/2019 0/50/5678  Systolic BP 893 388 266 664 861 612 240  Diastolic BP 89 85 85 88 84 80 82  Wt. (Lbs) 214 217 217.12 221 232.04 230.5 230.12  BMI 31.6 32.05 32.06 32.64 34.27 34.04 33.98

## 2020-09-28 ENCOUNTER — Ambulatory Visit (INDEPENDENT_AMBULATORY_CARE_PROVIDER_SITE_OTHER): Payer: 59

## 2020-09-28 ENCOUNTER — Other Ambulatory Visit: Payer: Self-pay

## 2020-09-28 DIAGNOSIS — Z23 Encounter for immunization: Secondary | ICD-10-CM

## 2020-10-04 ENCOUNTER — Other Ambulatory Visit: Payer: Self-pay

## 2020-10-04 ENCOUNTER — Other Ambulatory Visit: Payer: Self-pay | Admitting: Family Medicine

## 2020-10-04 ENCOUNTER — Ambulatory Visit (HOSPITAL_COMMUNITY)
Admission: RE | Admit: 2020-10-04 | Discharge: 2020-10-04 | Disposition: A | Discharge status: Home / Self Care | Payer: 59 | Source: Ambulatory Visit | Attending: Family Medicine | Admitting: Family Medicine

## 2020-10-04 DIAGNOSIS — Z1231 Encounter for screening mammogram for malignant neoplasm of breast: Secondary | ICD-10-CM | POA: Diagnosis not present

## 2020-11-20 ENCOUNTER — Other Ambulatory Visit: Payer: Self-pay | Admitting: Family Medicine

## 2020-12-18 ENCOUNTER — Other Ambulatory Visit: Payer: Self-pay | Admitting: Family Medicine

## 2020-12-29 ENCOUNTER — Other Ambulatory Visit: Payer: Self-pay | Admitting: Family Medicine

## 2021-03-01 ENCOUNTER — Other Ambulatory Visit: Payer: Self-pay | Admitting: Family Medicine

## 2021-03-22 ENCOUNTER — Other Ambulatory Visit: Payer: Self-pay | Admitting: Family Medicine

## 2021-03-28 ENCOUNTER — Encounter: Payer: Self-pay | Admitting: Family Medicine

## 2021-04-01 ENCOUNTER — Other Ambulatory Visit: Payer: Self-pay

## 2021-04-01 DIAGNOSIS — E785 Hyperlipidemia, unspecified: Secondary | ICD-10-CM

## 2021-04-01 DIAGNOSIS — I10 Essential (primary) hypertension: Secondary | ICD-10-CM

## 2021-04-01 DIAGNOSIS — E559 Vitamin D deficiency, unspecified: Secondary | ICD-10-CM

## 2021-04-01 NOTE — Telephone Encounter (Signed)
Contacted- will come the mon before her appt wed

## 2021-04-02 ENCOUNTER — Other Ambulatory Visit: Payer: Self-pay | Admitting: Family Medicine

## 2021-04-05 LAB — CMP14+EGFR
ALT: 40 IU/L — ABNORMAL HIGH (ref 0–32)
AST: 39 IU/L (ref 0–40)
Albumin/Globulin Ratio: 1.5 (ref 1.2–2.2)
Albumin: 4.5 g/dL (ref 3.8–4.9)
Alkaline Phosphatase: 122 IU/L — ABNORMAL HIGH (ref 44–121)
BUN/Creatinine Ratio: 23 (ref 9–23)
BUN: 19 mg/dL (ref 6–24)
Bilirubin Total: 0.3 mg/dL (ref 0.0–1.2)
CO2: 24 mmol/L (ref 20–29)
Calcium: 9.6 mg/dL (ref 8.7–10.2)
Chloride: 105 mmol/L (ref 96–106)
Creatinine, Ser: 0.84 mg/dL (ref 0.57–1.00)
Globulin, Total: 3 g/dL (ref 1.5–4.5)
Glucose: 88 mg/dL (ref 65–99)
Potassium: 4.4 mmol/L (ref 3.5–5.2)
Sodium: 145 mmol/L — ABNORMAL HIGH (ref 134–144)
Total Protein: 7.5 g/dL (ref 6.0–8.5)
eGFR: 80 mL/min/{1.73_m2} (ref >59)

## 2021-04-05 LAB — CBC
Hematocrit: 39.7 % (ref 34.0–46.6)
Hemoglobin: 12.3 g/dL (ref 11.1–15.9)
MCH: 27.9 pg (ref 26.6–33.0)
MCHC: 31 g/dL — ABNORMAL LOW (ref 31.5–35.7)
MCV: 90 fL (ref 79–97)
Platelets: 208 10*3/uL (ref 150–450)
RBC: 4.41 x10E6/uL (ref 3.77–5.28)
RDW: 12.4 % (ref 11.7–15.4)
WBC: 3.4 10*3/uL (ref 3.4–10.8)

## 2021-04-05 LAB — TSH: TSH: 1.86 u[IU]/mL (ref 0.450–4.500)

## 2021-04-05 LAB — VITAMIN D 25 HYDROXY (VIT D DEFICIENCY, FRACTURES): Vit D, 25-Hydroxy: 54.5 ng/mL (ref 30.0–100.0)

## 2021-04-06 ENCOUNTER — Other Ambulatory Visit: Payer: Self-pay

## 2021-04-06 ENCOUNTER — Encounter: Payer: Self-pay | Admitting: Family Medicine

## 2021-04-06 ENCOUNTER — Ambulatory Visit (INDEPENDENT_AMBULATORY_CARE_PROVIDER_SITE_OTHER): Payer: Self-pay | Admitting: Family Medicine

## 2021-04-06 VITALS — BP 138/92 | HR 87 | Resp 16 | Ht 69.0 in | Wt 209.0 lb

## 2021-04-06 DIAGNOSIS — Z0000 Encounter for general adult medical examination without abnormal findings: Secondary | ICD-10-CM

## 2021-04-06 NOTE — Progress Notes (Signed)
    Allison Hart     MRN: [id number]      DOB: 1961/08/17  HPI: Patient is in for annual physical exam. C/o flat feet, knee and back pain, requests note for her job to permit tennis shoes, based on medical history , which is appropriate Recent labs,  are reviewed. Immunization is reviewed , and  needs to be updated needs Covid Booster   PE: BP (!) 138/92   Pulse 87   Resp 16   Ht 5\' 9"  (1.753 m)   Wt 209 lb (94.8 kg)   LMP  (LMP Unknown)   SpO2 98%   BMI 30.86 kg/m   Pleasant  female, alert and oriented x 3, in no cardio-pulmonary distress. Afebrile. HEENT No facial trauma or asymetry. Sinuses non tender.  Extra occullar muscles intact.. External ears normal, . Neck: supple, no adenopathy,JVD or thyromegaly.No bruits.  Chest: Clear to ascultation bilaterally.No crackles or wheezes. Non tender to palpation  Breast: No asymetry,no masses or lumps. No tenderness. No nipple discharge or inversion. No axillary or supraclavicular adenopathy  Cardiovascular system; Heart sounds normal,  S1 and  S2 ,no S3.  No murmur, or thrill. Apical beat not displaced Peripheral pulses normal.  Abdomen: Soft, non tender, no organomegaly or masses. No bruits. Bowel sounds normal. No guarding, tenderness or rebound.       Musculoskeletal exam: Decreased  ROM of spine,  and knees. deformity ,swelling and  crepitus noted of knees Bilateral  flat feet and bunnions No muscle wasting or atrophy.   Neurologic: Cranial nerves 2 to 12 intact. Power, tone ,sensation and reflexes normal throughout. Mild disturbance in gait. No tremor.  Skin: Intact, no ulceration, erythema , scaling or rash noted. Pigmentation normal throughout  Psych; Normal mood and affect. Judgement and concentration normal   Assessment & Plan:  Annual physical exam Annual exam as documented. Counseling done  re healthy lifestyle involving commitment to 150 minutes exercise per week, heart healthy diet,  and attaining healthy weight.The importance of adequate sleep also discussed. Regular seat belt use and home safety, is also discussed. Changes in health habits are decided on by the patient with goals and time frames  set for achieving them. Immunization and cancer screening needs are specifically addressed at this visit.

## 2021-04-06 NOTE — Patient Instructions (Signed)
F/U in 6 months, call if you need me sooner  Please get your 2nd covid Booster, this  is overdue  Call and come for flu vaccine in September  You need to reduce fried and fatty foods  Work on 10 pound weight loss over next 6 mmonths  Letter for work stating that you had your physical exam and letter for working requesting that you be allowed to wear tennis shoes because of medical conditions will be given to you today ( 2 different letters)  It is important that you exercise regularly at least 30 minutes 5 times a week. If you develop chest pain, have severe difficulty breathing, or feel very tired, stop exercising immediately and seek medical attention  Thanks for choosing [facility], we consider it a privelige to serve you.

## 2021-04-07 ENCOUNTER — Encounter: Payer: Self-pay | Admitting: Family Medicine

## 2021-04-07 NOTE — Assessment & Plan Note (Signed)

## 2021-04-13 ENCOUNTER — Other Ambulatory Visit: Payer: Self-pay | Admitting: Family Medicine

## 2021-05-26 ENCOUNTER — Other Ambulatory Visit: Payer: Self-pay | Admitting: Family Medicine

## 2021-06-24 ENCOUNTER — Other Ambulatory Visit: Payer: Self-pay | Admitting: Family Medicine

## 2021-07-08 ENCOUNTER — Other Ambulatory Visit: Payer: Self-pay | Admitting: Family Medicine

## 2021-08-01 ENCOUNTER — Other Ambulatory Visit: Payer: Self-pay | Admitting: Family Medicine

## 2021-08-16 ENCOUNTER — Other Ambulatory Visit: Payer: Self-pay | Admitting: Interventional Radiology

## 2021-08-16 DIAGNOSIS — I722 Aneurysm of renal artery: Secondary | ICD-10-CM

## 2021-08-19 ENCOUNTER — Other Ambulatory Visit: Payer: Self-pay | Admitting: Family Medicine

## 2021-08-19 DIAGNOSIS — J3089 Other allergic rhinitis: Secondary | ICD-10-CM

## 2021-08-27 ENCOUNTER — Other Ambulatory Visit: Payer: Self-pay | Admitting: Family Medicine

## 2021-09-04 ENCOUNTER — Other Ambulatory Visit: Payer: Self-pay | Admitting: Family Medicine

## 2021-09-05 ENCOUNTER — Ambulatory Visit (HOSPITAL_COMMUNITY)
Admission: RE | Admit: 2021-09-05 | Discharge: 2021-09-05 | Disposition: A | Discharge status: Home / Self Care | Payer: Commercial Managed Care - PPO | Source: Ambulatory Visit | Attending: Interventional Radiology | Admitting: Interventional Radiology

## 2021-09-05 ENCOUNTER — Other Ambulatory Visit: Payer: Self-pay

## 2021-09-05 ENCOUNTER — Encounter (HOSPITAL_COMMUNITY): Payer: Self-pay

## 2021-09-05 DIAGNOSIS — I722 Aneurysm of renal artery: Secondary | ICD-10-CM | POA: Diagnosis not present

## 2021-09-05 LAB — POCT I-STAT CREATININE: Creatinine, Ser: 1.1 mg/dL — ABNORMAL HIGH (ref 0.44–1.00)

## 2021-09-05 MED ORDER — IOHEXOL 350 MG/ML SOLN
100.0000 mL | Freq: Once | INTRAVENOUS | Status: AC | PRN
  Administered 2021-09-05: 14:00:00 100 mL via INTRAVENOUS

## 2021-09-07 ENCOUNTER — Ambulatory Visit
Admission: RE | Admit: 2021-09-07 | Discharge: 2021-09-07 | Disposition: A | Discharge status: Home / Self Care | Payer: Commercial Managed Care - PPO | Source: Ambulatory Visit | Attending: Interventional Radiology | Admitting: Interventional Radiology

## 2021-09-07 ENCOUNTER — Encounter: Payer: Self-pay | Admitting: *Deleted

## 2021-09-07 ENCOUNTER — Other Ambulatory Visit: Payer: Self-pay

## 2021-09-07 DIAGNOSIS — I722 Aneurysm of renal artery: Secondary | ICD-10-CM

## 2021-09-07 HISTORY — PX: IR RADIOLOGIST EVAL & MGMT: IMG5224

## 2021-09-07 NOTE — Progress Notes (Signed)
Chief Complaint: Patient was consulted remotely today (TeleHealth) for complex right renal artery aneurysm at the request of [name]    Referring Physician(s): Rissie Sculley K  History of Present Illness: Allison Hart is a 60 y.o. 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 She underwent endovascular repair with placement of coaxial overlapping pipeline flow diverting stents in April 2017 and now presents for scheduled follow-up.   Medically, [name] is asymptomatic. She denies flank pain, hematuria or dysuria.    Past Medical History:  Diagnosis Date   Anxiety    Arthritis    Essential hypertension, benign    GERD (gastroesophageal reflux disease)    Herpes    History of kidney stones    Murmur    Obesity    Sinusitis    Urinary frequency     Past Surgical History:  Procedure Laterality Date   BIOPSY  08/29/2018   Procedure: BIOPSY;  Surgeon: Daneil Dolin, MD;  [facility];  Service: Endoscopy;;  gastric   COLONOSCOPY  11/13/2012   Procedure: COLONOSCOPY;  Surgeon: Rogene Houston, MD;  [facility];  Service: Endoscopy;  Laterality: N/A;  930   embolization of uterine artery X 2     ESOPHAGOGASTRODUODENOSCOPY (EGD) WITH PROPOFOL N/A 08/29/2018   Procedure: ESOPHAGOGASTRODUODENOSCOPY (EGD) WITH PROPOFOL;  Surgeon: Daneil Dolin, MD;  [facility];  Service: Endoscopy;  Laterality: N/A;  3:00pm   INGUINAL LYMPH NODE BIOPSY Right 04/14/2016   Procedure: RIGHT INGUINAL LYMPH NODE BIOPSY;  Surgeon: Aviva Signs, MD;  Location: [facility];  Service: General;  Laterality: Right;   IR ANGIO INTRA EXTRACRAN SEL COM CAROTID INNOMINATE BILAT MOD SED  02/12/2017   IR ANGIO VERTEBRAL SEL VERTEBRAL BILAT MOD SED  02/12/2017   IR GENERIC HISTORICAL  04/27/2016   IR RADIOLOGIST EVAL & MGMT 04/27/2016 Jacqulynn Cadet, MD [facility]   IR GENERIC HISTORICAL  12/05/2016   IR RADIOLOGIST EVAL & MGMT  12/05/2016 Jacqulynn Cadet, MD [facility]   IR RADIOLOGIST EVAL & MGMT  01/22/2017   IR RADIOLOGIST EVAL & MGMT  08/06/2018   IR RADIOLOGIST EVAL & MGMT  08/19/2020   IR RADIOLOGIST EVAL & MGMT  09/07/2021   Left inguinal hernia repair and endometriosis  2007   No definitive records    Left parotid bx benign  2005   RADIOLOGY WITH ANESTHESIA N/A 01/18/2016   Procedure: RADIOLOGY WITH ANESTHESIA;  Surgeon: Luanne Bras, MD;  Location: [place];  Service: Radiology;  Laterality: N/A;   RADIOLOGY WITH ANESTHESIA N/A 02/12/2017   Procedure: Cherie Dark EMBOLIZATION;  Surgeon: Luanne Bras, MD;  Location: [place];  Service: Radiology;  Laterality: N/A;   renal artery aneurysm repair right Right 12/3543   UMBILICAL HERNIA REPAIR  infancy     Allergies: Versed [midazolam] and Sulfa antibiotics  Medications: Prior to Admission medications   Medication Sig Start Date End Date Taking? Authorizing Provider  acyclovir (ZOVIRAX) 800 MG tablet Take 800 mg by mouth 5 (five) times daily.    [provider]  amLODipine (NORVASC) 10 MG tablet Take 1 tablet by mouth once daily 09/05/21   Fayrene Helper, MD  aspirin EC 81 MG tablet Take 1 tablet (81 mg total) by mouth daily. 07/30/18   Fayrene Helper, MD  cholecalciferol (VITAMIN D) 1000 units tablet Take 1,000 Units by mouth daily.    [provider]  fluticasone (FLONASE) 50 MCG/ACT  nasal spray Place 2 sprays into both nostrils daily. 12/25/18   Sharion Balloon, FNP  furosemide (LASIX) 40 MG tablet Take 1 tablet by mouth once daily 08/01/21   Fayrene Helper, MD  ketoconazole (NIZORAL) 2 % cream Apply 1 application topically daily. 07/21/20   Lindell Spar, MD  loratadine [facility] ALLERGY RELIEF) 10 MG tablet Take 1 tablet by mouth once daily 08/29/21   Fayrene Helper, MD  potassium chloride SA (KLOR-CON) 20 MEQ tablet Take 1 tablet by mouth twice daily 07/12/21   Fayrene Helper, MD  Pyridoxine HCl (VITAMIN B-6  PO) Take 1 tablet by mouth daily.    [provider]  spironolactone (ALDACTONE) 25 MG tablet Take 1 tablet by mouth once daily 06/24/21   Fayrene Helper, MD     Family History  Problem Relation Age of Onset   Hypertension Mother    Hyperlipidemia Mother    Thyroid disease Mother    Coronary artery disease Mother    Irregular heart beat Mother    Fibroids Sister    Hypertension Sister    Ulcerative colitis Son    Colon cancer Neg Hx     Social History   Socioeconomic History   Marital status: Married    Spouse name: Not on file   Number of children: Not on file   Years of education: Not on file   Highest education level: Associate degree: occupational, Hotel manager, or vocational program  Occupational History   Not on file  Tobacco Use   Smoking status: Former    Packs/day: 0.25    Years: 5.00    Pack years: 1.25    Types: Cigarettes    Quit date: 1997    Years since quitting: 25.9   Smokeless tobacco: Never  Vaping Use   Vaping Use: Never used  Substance and Sexual Activity   Alcohol use: Yes    Comment: rare   Drug use: No   Sexual activity: Yes    Birth control/protection: None  Other Topics Concern   Not on file  Social History Narrative   Not on file   Social Determinants of Health   Financial Resource Strain: Not on file  Food Insecurity: Not on file  Transportation Needs: Not on file  Physical Activity: Not on file  Stress: Not on file  Social Connections: Not on file    Review of Systems  Review of Systems: A 12 point ROS discussed and pertinent positives are indicated in the HPI above.  All other systems are negative.  Physical Exam No direct physical exam was performed (except for noted visual exam findings with Video Visits).    Vital Signs: LMP  (LMP Unknown)   Imaging: IR Radiologist Eval & Mgmt  Result Date: 09/07/2021 Please refer to notes tab for details about interventional procedure. (Op Note)  CT Angio Abd/Pel w/  and/or w/o  Result Date: 09/05/2021 CLINICAL DATA:  Previous endovascular repair of a complex right renal artery aneurysm. EXAM: CT ANGIOGRAPHY ABDOMEN AND PELVIS WITH CONTRAST AND WITHOUT CONTRAST TECHNIQUE: Multidetector CT imaging of the abdomen and pelvis was performed using the standard protocol during bolus administration of intravenous contrast. Multiplanar reconstructed images and MIPs were obtained and reviewed to evaluate the vascular anatomy. CONTRAST:  145mL OMNIPAQUE IOHEXOL 350 MG/ML SOLN COMPARISON:  06/16/2020 FINDINGS: VASCULAR Aorta: Similar tortuosity of the lower thoracic and abdominal aorta without acute aortic process, aneurysm, dissection, or occlusion. No retroperitoneal hemorrhage or hematoma. Aorta remains patent. Celiac:  Patent without evidence of aneurysm, dissection, vasculitis or significant stenosis. SMA: Remains widely patent. Replaced right hepatic artery again noted. Renals: Left renal arteries widely patent with minor origin calcification. Right renal artery remains patent including the previous right renal artery stents treating the complex right renal artery aneurysm. Measuring in a similar fashion and location, the large component of the thrombosed treated renal artery aneurysms measures 2.0 x 1.6 cm, previously 2.1 x 1.6 cm. The posterior component of the complex aneurysm measures 1.4 cm, also unchanged. Complex aneurysm has a stable thrombosed appearance. Stable areas of ischemic atrophy of the right kidney. IMA: Remains patent off the distal aorta including its branches Inflow: Minor atherosclerotic change tortuosity. Pelvic iliac vessels all remain patent. No inflow disease or occlusion. No acute arterial abnormality. Proximal Outflow: Bilateral common femoral and visualized portions of the superficial and profunda femoral arteries are patent without evidence of aneurysm, dissection, vasculitis or significant stenosis. Veins: No abdominopelvic veno-occlusive process.  Review of the MIP images confirms the above findings. NON-VASCULAR Lower chest: Bibasilar atelectasis. Stable cardiomegaly. No pericardial pleural effusion. No acute lower chest finding. Hepatobiliary: No focal liver abnormality is seen. No gallstones, gallbladder wall thickening, or biliary dilatation. Pancreas: Unremarkable. No pancreatic ductal dilatation or surrounding inflammatory changes. Spleen: Normal in size without focal abnormality. Adrenals/Urinary Tract: Normal adrenal glands. Stable atrophy of the right kidney. Stable left kidney upper pole cyst. No hydroureter or ureteral calculus. Bladder is collapsed. Stomach/Bowel: Stomach is within normal limits. Appendix appears normal. No evidence of bowel wall thickening, distention, or inflammatory changes. Lymphatic: No bulky adenopathy Reproductive: Calcified degenerated fibroids in the pelvis. No acute finding. No other adnexal abnormality. No significant free fluid. Other: No abdominal wall hernia or abnormality. No abdominopelvic ascites. Musculoskeletal: Degenerative changes of the spine and lumbar facet arthropathy. No acute osseous finding. IMPRESSION: VASCULAR Stable endovascular repair of the complex bilobed right renal artery aneurysm. Right renal artery and stented segment remain patent. Excluded aneurysm remains thrombosed with similar size measurements compared to 2021. NON-VASCULAR No other acute intra-abdominal or pelvic finding. Stable chronic findings as above. Electronically Signed   By: Jerilynn Mages.  Shick M.D.   On: 09/05/2021 14:25    Labs:  CBC: Recent Labs    04/04/21 0812  WBC 3.4  HGB 12.3  HCT 39.7  PLT 208    COAGS: No results for input(s): INR, APTT in the last 8760 hours.  BMP: Recent Labs    04/04/21 0812 09/05/21 1347  NA 145*  --   K 4.4  --   CL 105  --   CO2 24  --   GLUCOSE 88  --   BUN 19  --   CALCIUM 9.6  --   CREATININE 0.84 1.10*    LIVER FUNCTION TESTS: Recent Labs    04/04/21 0812  BILITOT  0.3  AST 39  ALT 40*  ALKPHOS 122*  PROT 7.5  ALBUMIN 4.5    TUMOR MARKERS: No results for input(s): AFPTM, CEA, CA199, CHROMGRNA in the last 8760 hours.  Assessment and Plan:  Very pleasant 60 year old female doing exceptionally well more than 5 years status post endovascular repair of a complex bilobed right renal artery aneurysm utilizing coaxially in laid pipeline flow diverting stents.  Her aneurysm has demonstrated no recurrent arterialization or evidence of growth over this time course.  We can likely proceed from this point forward with a less intensive surveillance protocol.  1.)  Follow-up CT arteriogram of the abdomen and clinic visit in 2  years (November 2024).   Electronically Signed: Criselda Peaches 09/07/2021, 4:22 PM   I spent a total of  15 Minutes in remote  clinical consultation, greater than 50% of which was counseling/coordinating care for right renal artery aneurysm.    Visit type: Audio only (telephone). Audio (no video) only due to patient preference. Alternative for in-person consultation at [facility], [address], [place], [place]. This visit type was conducted due to national recommendations for restrictions regarding the COVID-19 Pandemic (e.g. social distancing).  This format is felt to be most appropriate for this patient at this time.  All issues noted in this document were discussed and addressed.

## 2021-09-20 ENCOUNTER — Ambulatory Visit: Payer: Commercial Managed Care - PPO | Admitting: Family Medicine

## 2021-09-21 ENCOUNTER — Other Ambulatory Visit: Payer: Self-pay | Admitting: Family Medicine

## 2021-10-06 ENCOUNTER — Ambulatory Visit: Payer: Commercial Managed Care - PPO | Admitting: Family Medicine

## 2021-10-08 ENCOUNTER — Other Ambulatory Visit: Payer: Self-pay | Admitting: Family Medicine

## 2021-10-21 ENCOUNTER — Encounter: Payer: Self-pay | Admitting: Family Medicine

## 2021-10-21 ENCOUNTER — Other Ambulatory Visit: Payer: Self-pay

## 2021-10-21 ENCOUNTER — Other Ambulatory Visit (HOSPITAL_COMMUNITY): Payer: Self-pay | Admitting: Family Medicine

## 2021-10-21 ENCOUNTER — Ambulatory Visit (INDEPENDENT_AMBULATORY_CARE_PROVIDER_SITE_OTHER): Payer: Commercial Managed Care - PPO | Admitting: Family Medicine

## 2021-10-21 VITALS — BP 149/92 | HR 82 | Ht 69.0 in | Wt 213.1 lb

## 2021-10-21 DIAGNOSIS — E669 Obesity, unspecified: Secondary | ICD-10-CM

## 2021-10-21 DIAGNOSIS — E785 Hyperlipidemia, unspecified: Secondary | ICD-10-CM | POA: Diagnosis not present

## 2021-10-21 DIAGNOSIS — I10 Essential (primary) hypertension: Secondary | ICD-10-CM | POA: Diagnosis not present

## 2021-10-21 DIAGNOSIS — M170 Bilateral primary osteoarthritis of knee: Secondary | ICD-10-CM

## 2021-10-21 DIAGNOSIS — Z1231 Encounter for screening mammogram for malignant neoplasm of breast: Secondary | ICD-10-CM

## 2021-10-21 DIAGNOSIS — J3089 Other allergic rhinitis: Secondary | ICD-10-CM | POA: Diagnosis not present

## 2021-10-21 MED ORDER — AZELASTINE HCL 0.1 % NA SOLN: 2.0000 | Freq: Two times a day (BID) | NASAL | 12 refills | Status: DC

## 2021-10-21 MED ORDER — FLUTICASONE PROPIONATE 50 MCG/ACT NA SUSP: 2.0000 | Freq: Every day | NASAL | 2 refills | Status: DC

## 2021-10-21 MED ORDER — AMLODIPINE-OLMESARTAN 10-20 MG PO TABS: 1.0000 | ORAL_TABLET | Freq: Every day | ORAL | 3 refills | Status: DC

## 2021-10-21 MED ORDER — MONTELUKAST SODIUM 10 MG PO TABS: 10.0000 mg | ORAL_TABLET | Freq: Every day | ORAL | 3 refills | Status: DC

## 2021-10-21 NOTE — Patient Instructions (Addendum)
F/U in 2 months,, call if you need me sooner, re eval BP and weight  Fasting chem 7 and eGFR 3 to 5 days before next appt  New for BP ia azor 10/20 one daily, stop amlodipine 10 mg dailyonce you start this (it already has amlodipine 10 mg tablet in it)  Please schedule mammogram at checkout early morning appt please  You are referred to Dr Aline Brochure re knees  New additional med for allergies, astellin and montelucast, continue claritin and flonase  It is important that you exercise regularly at least 30 minutes 5 times a week. If you develop chest pain, have severe difficulty breathing, or feel very tired, stop exercising immediately and seek medical attention   Please increase colored vegetables and reduce carbs and sweets Thanks for choosing [facility], we consider it a privelige to serve you. '

## 2021-10-22 ENCOUNTER — Encounter: Payer: Self-pay | Admitting: Family Medicine

## 2021-10-22 DIAGNOSIS — M170 Bilateral primary osteoarthritis of knee: Secondary | ICD-10-CM | POA: Insufficient documentation

## 2021-10-22 NOTE — Assessment & Plan Note (Signed)
Uncontrolled, med adjustment made F/u in 2 monhts DASH diet and commitment to daily physical activity for a minimum of 30 minutes discussed and encouraged, as a part of hypertension management. The importance of attaining a healthy weight is also discussed.  BP/Weight 10/21/2021 04/06/2021 09/21/2020 07/21/2020 04/19/2020 9/82/6415 05/12/939  Systolic BP 768 088 110 315 945 859 292  Diastolic BP 92 92 89 85 85 88 84  Wt. (Lbs) 213.12 209 214 217 217.12 221 232.04  BMI 31.47 30.86 31.6 32.05 32.06 32.64 34.27

## 2021-10-22 NOTE — Progress Notes (Signed)
Allison Hart     MRN: [id number]      DOB: October 13, 1960   HPI Allison Hart is here for follow up and re-evaluation of chronic medical conditions, medication management and review of any available recent lab and radiology data.  Preventive health is updated, specifically  Cancer screening and Immunization.   The PT denies any adverse reactions to current medications since the last visit.  C/o worsening bilateral knee pain and instability at times, right worse than left C/o head congestion and increased sinus and nasal congestion ROS Denies recent fever or chills. Denies  ear pain or sore throat. Denies chest congestion, productive cough or wheezing. Denies chest pains, palpitations and leg swelling Denies abdominal pain, nausea, vomiting,diarrhea or constipation.   Denies dysuria, frequency, hesitancy or incontinence.  Denies headaches, seizures, numbness, or tingling. Denies depression, anxiety or insomnia. Denies skin break down or rash.   PE  BP (!) 149/92    Pulse 82    Ht 5\' 9"  (1.753 m)    Wt 213 lb 1.9 oz (96.7 kg)    LMP  (LMP Unknown)    SpO2 91%    BMI 31.47 kg/m   Patient alert and oriented and in no cardiopulmonary distress.  HEENT: No facial asymmetry, EOMI,     Neck supple .  Chest: Clear to auscultation bilaterally.  CVS: S1, S2 no murmurs, no S3.Regular rate.  ABD: Soft non tender.   Ext: No edema  MS: Adequate ROM spine, shoulders, hips and reduced in knees.  Skin: Intact, no ulcerations or rash noted.  Psych: Good eye contact, normal affect. Memory intact not anxious or depressed appearing.  CNS: CN 2-12 intact, power,  normal throughout.no focal deficits noted.   Assessment & Plan  Essential hypertension Uncontrolled, med adjustment made F/u in 2 monhts DASH diet and commitment to daily physical activity for a minimum of 30 minutes discussed and encouraged, as a part of hypertension management. The importance of attaining a healthy weight is  also discussed.  BP/Weight 10/21/2021 04/06/2021 09/21/2020 07/21/2020 04/19/2020 12/22/4006 03/15/6194  Systolic BP 093 267 124 580 998 338 250  Diastolic BP 92 92 89 85 85 88 84  Wt. (Lbs) 213.12 209 214 217 217.12 221 232.04  BMI 31.47 30.86 31.6 32.05 32.06 32.64 34.27       Hyperlipidemia LDL goal <100 Hyperlipidemia:Low fat diet discussed and encouraged.   Lipid Panel  Lab Results  Component Value Date   CHOL 233 (H) 04/06/2020   HDL 53 04/06/2020   LDLCALC 162 (H) 04/06/2020   TRIG 79 04/06/2020   CHOLHDL 4.4 04/06/2020   Updated lab needed at/ before next visit. Needs to reduce fat in diet    Obesity (BMI 30.0-34.9)  Patient re-educated about  the importance of commitment to a  minimum of 150 minutes of exercise per week as able.  The importance of healthy food choices with portion control discussed, as well as eating regularly and within a 12 hour window most days. The need to choose "clean , green" food 50 to 75% of the time is discussed, as well as to make water the primary drink and set a goal of 64 ounces water daily.    Weight /BMI 10/21/2021 04/06/2021 09/21/2020  WEIGHT 213 lb 1.9 oz 209 lb 214 lb  HEIGHT 5\' 9"  5\' 9"  5\' 9"   BMI 31.47 kg/m2 30.86 kg/m2 31.6 kg/m2      Allergic rhinitis Uncontrolled, med adjustment made , 2 additional meds started  Osteoarthritis of both knees Progressive with insatblitiy, right worse than left, refer Ortho

## 2021-10-22 NOTE — Assessment & Plan Note (Signed)
°  Patient re-educated about  the importance of commitment to a  minimum of 150 minutes of exercise per week as able.  The importance of healthy food choices with portion control discussed, as well as eating regularly and within a 12 hour window most days. The need to choose "clean , green" food 50 to 75% of the time is discussed, as well as to make water the primary drink and set a goal of 64 ounces water daily.    Weight /BMI 10/21/2021 04/06/2021 09/21/2020  WEIGHT 213 lb 1.9 oz 209 lb 214 lb  HEIGHT 5\' 9"  5\' 9"  5\' 9"   BMI 31.47 kg/m2 30.86 kg/m2 31.6 kg/m2

## 2021-10-22 NOTE — Assessment & Plan Note (Signed)
Uncontrolled, med adjustment made , 2 additional meds started

## 2021-10-22 NOTE — Assessment & Plan Note (Signed)
Hyperlipidemia:Low fat diet discussed and encouraged.   Lipid Panel  Lab Results  Component Value Date   CHOL 233 (H) 04/06/2020   HDL 53 04/06/2020   LDLCALC 162 (H) 04/06/2020   TRIG 79 04/06/2020   CHOLHDL 4.4 04/06/2020   Updated lab needed at/ before next visit. Needs to reduce fat in diet

## 2021-10-22 NOTE — Assessment & Plan Note (Signed)
Progressive with insatblitiy, right worse than left, refer Ortho

## 2021-10-24 ENCOUNTER — Encounter: Payer: Self-pay | Admitting: Family Medicine

## 2021-10-31 ENCOUNTER — Encounter: Payer: Self-pay | Admitting: Orthopedic Surgery

## 2021-10-31 ENCOUNTER — Ambulatory Visit (INDEPENDENT_AMBULATORY_CARE_PROVIDER_SITE_OTHER): Payer: Commercial Managed Care - PPO | Admitting: Orthopedic Surgery

## 2021-10-31 ENCOUNTER — Other Ambulatory Visit: Payer: Self-pay

## 2021-10-31 ENCOUNTER — Ambulatory Visit: Payer: Commercial Managed Care - PPO

## 2021-10-31 ENCOUNTER — Other Ambulatory Visit: Payer: Self-pay | Admitting: Orthopedic Surgery

## 2021-10-31 VITALS — BP 124/86 | HR 82 | Ht 69.0 in | Wt 213.0 lb

## 2021-10-31 DIAGNOSIS — G8929 Other chronic pain: Secondary | ICD-10-CM

## 2021-10-31 DIAGNOSIS — M1711 Unilateral primary osteoarthritis, right knee: Secondary | ICD-10-CM

## 2021-10-31 DIAGNOSIS — M1712 Unilateral primary osteoarthritis, left knee: Secondary | ICD-10-CM

## 2021-10-31 DIAGNOSIS — M25561 Pain in right knee: Secondary | ICD-10-CM

## 2021-10-31 DIAGNOSIS — M25562 Pain in left knee: Secondary | ICD-10-CM

## 2021-10-31 NOTE — Patient Instructions (Signed)

## 2021-10-31 NOTE — Progress Notes (Signed)
New Patient Visit  Assessment: Allison Hart is a 61 y.o. female with the following: 1. Arthritis of left knee 2. Arthritis of right knee  Bilateral valgus alignment  Plan: Patient has had progressively worsening pain in bilateral knees.  She has a noted valgus alignment bilaterally.  Previous history of right knee patellar instability.  She has not sought treatment previously.  We reviewed radiographs in clinic today which demonstrates severe degenerative changes within the lateral compartment specifically, with noticeable valgus alignment.  She also has degenerative changes within the patellofemoral joints.  She will continue with medications as needed.  Consider adding Voltaren gel.  I also provided her with some home exercises.  If her pain worsens, we can consider an injection.  She is not interested in steroid injections at this time.  If she would like to see Dr. Aline Brochure discussed knee replacement in more detail, this can be scheduled.   Follow-up: Return if symptoms worsen or fail to improve.  Subjective:  Chief Complaint  Patient presents with   Knee Pain    Bil knee pain, + giving way, anterior knee pain all around the joint.  Have hurt her for years.  Old sports injuries.      History of Present Illness: Allison Hart is a 61 y.o. female who has been referred to clinic today by Allison Nakayama, MD for evaluation of bilateral knee pain.  Right knee is currently worse than the left.  This has been progressively worsening over several years.  Pain is primarily within the lateral aspect of the knee, as well as the anterior knee.  She notes popping and cracking with range of motion.  She has been using topical medications, as well as over-the-counter medications, with limited improvement in her symptoms.  No physical therapy.  She states she has had 1 injection in the right knee, several years ago, with limited improvement in her pain.  She notes prior injuries to her right  knee,'s consistent with patellar instability.  She has not had surgery in the right knee previously.   Review of Systems: No fevers or chills No numbness or tingling No chest pain No shortness of breath No bowel or bladder dysfunction No GI distress No headaches   Medical History:  Past Medical History:  Diagnosis Date   Anxiety    Arthritis    Essential hypertension, benign    GERD (gastroesophageal reflux disease)    Herpes    History of kidney stones    Murmur    Obesity    Sinusitis    Urinary frequency     Past Surgical History:  Procedure Laterality Date   BIOPSY  08/29/2018   Procedure: BIOPSY;  Surgeon: Daneil Dolin, MD;  [facility];  Service: Endoscopy;;  gastric   COLONOSCOPY  11/13/2012   Procedure: COLONOSCOPY;  Surgeon: Rogene Houston, MD;  [facility];  Service: Endoscopy;  Laterality: N/A;  930   embolization of uterine artery X 2     ESOPHAGOGASTRODUODENOSCOPY (EGD) WITH PROPOFOL N/A 08/29/2018   Procedure: ESOPHAGOGASTRODUODENOSCOPY (EGD) WITH PROPOFOL;  Surgeon: Daneil Dolin, MD;  [facility];  Service: Endoscopy;  Laterality: N/A;  3:00pm   INGUINAL LYMPH NODE BIOPSY Right 04/14/2016   Procedure: RIGHT INGUINAL LYMPH NODE BIOPSY;  Surgeon: Aviva Signs, MD;  Location: [facility];  Service: General;  Laterality: Right;   IR ANGIO INTRA EXTRACRAN SEL COM CAROTID INNOMINATE BILAT MOD SED  02/12/2017   IR  ANGIO VERTEBRAL SEL VERTEBRAL BILAT MOD SED  02/12/2017   IR GENERIC HISTORICAL  04/27/2016   IR RADIOLOGIST EVAL & MGMT 04/27/2016 Jacqulynn Cadet, MD [facility]   IR GENERIC HISTORICAL  12/05/2016   IR RADIOLOGIST EVAL & MGMT 12/05/2016 Jacqulynn Cadet, MD [facility]   IR RADIOLOGIST EVAL & MGMT  01/22/2017   IR RADIOLOGIST EVAL & MGMT  08/06/2018   IR RADIOLOGIST EVAL & MGMT  08/19/2020   IR RADIOLOGIST EVAL & MGMT  09/07/2021   Left inguinal hernia repair and endometriosis  2007   No definitive records     Left parotid bx benign  2005   RADIOLOGY WITH ANESTHESIA N/A 01/18/2016   Procedure: RADIOLOGY WITH ANESTHESIA;  Surgeon: Luanne Bras, MD;  Location: [place];  Service: Radiology;  Laterality: N/A;   RADIOLOGY WITH ANESTHESIA N/A 02/12/2017   Procedure: Cherie Dark EMBOLIZATION;  Surgeon: Luanne Bras, MD;  Location: [place];  Service: Radiology;  Laterality: N/A;   renal artery aneurysm repair right Right 10/5174   UMBILICAL HERNIA REPAIR  infancy     Family History  Problem Relation Age of Onset   Hypertension Mother    Hyperlipidemia Mother    Thyroid disease Mother    Coronary artery disease Mother    Irregular heart beat Mother    Fibroids Sister    Hypertension Sister    Ulcerative colitis Son    Colon cancer Neg Hx    Social History   Tobacco Use   Smoking status: Former    Packs/day: 0.25    Years: 5.00    Pack years: 1.25    Types: Cigarettes    Quit date: 1997    Years since quitting: 26.0   Smokeless tobacco: Never  Vaping Use   Vaping Use: Never used  Substance Use Topics   Alcohol use: Yes    Comment: rare   Drug use: No    Allergies  Allergen Reactions   Versed [Midazolam] Shortness Of Breath and Other (See Comments)    Sweating, anxious and "I felt like I was going to pass out"   Sulfa Antibiotics     Current Meds  Medication Sig   amlodipine-olmesartan (AZOR) 10-20 MG tablet Take 1 tablet by mouth daily.   aspirin EC 81 MG tablet Take 1 tablet (81 mg total) by mouth daily.   azelastine (ASTELIN) 0.1 % nasal spray Place 2 sprays into both nostrils 2 (two) times daily. Use in each nostril as directed   cholecalciferol (VITAMIN D) 1000 units tablet Take 1,000 Units by mouth daily.   fluticasone (FLONASE) 50 MCG/ACT nasal spray Place 2 sprays into both nostrils daily.   furosemide (LASIX) 40 MG tablet Take 1 tablet by mouth once daily   loratadine (EQ ALLERGY RELIEF) 10 MG tablet Take 1 tablet by mouth once daily   montelukast (SINGULAIR) 10 MG  tablet Take 1 tablet (10 mg total) by mouth at bedtime.   potassium chloride SA (KLOR-CON M) 20 MEQ tablet Take 1 tablet by mouth twice daily   Pyridoxine HCl (VITAMIN B-6 PO) Take 1 tablet by mouth daily.   spironolactone (ALDACTONE) 25 MG tablet Take 1 tablet by mouth once daily    Objective: BP 124/86    Pulse 82    Ht 5\' 9"  (1.753 m)    Wt 213 lb (96.6 kg)    LMP  (LMP Unknown)    BMI 31.45 kg/m   Physical Exam:  General: Alert and oriented. Gait: Slow steady  gait.  Evaluation the right knee demonstrates no deformity.  Mild effusion is appreciated.  Valgus alignment overall.  Tenderness palpation along the lateral joint line.  She is able to achieve full extension, flexion beyond 110 degrees.  Crepitus with range of motion testing.  No increased laxity varus or valgus stress.  Negative Lachman.  Evaluation left knee demonstrates no deformity.  Mild effusion is appreciated.  Valgus alignment overall.  She has tenderness to palpation along the lateral joint line.  Able to get to full extension.  She tolerates flexion beyond 110 degrees.  Crepitus with range of motion testing no increased laxity varus or valgus stress.  Negative Lachman.  Bilateral pes planus.  IMAGING: I personally ordered and reviewed the following images  X-rays of bilateral knees were obtained in clinic today.  Evidence of valgus alignment bilaterally.  Complete loss of joint space within the lateral compartment.  There are osteophytes present.  Mild loss of joint space within the bilateral patellofemoral compartments, with associated osteophytes.  Impression: Bilateral knee x-rays with moderate degenerative changes, most severe within the lateral compartments, and a valgus alignment overall.   New Medications:  No orders of the defined types were placed in this encounter.     Mordecai Rasmussen, MD  10/31/2021 10:04 PM

## 2021-11-14 ENCOUNTER — Other Ambulatory Visit: Payer: Self-pay

## 2021-11-14 ENCOUNTER — Ambulatory Visit (HOSPITAL_COMMUNITY)
Admission: RE | Admit: 2021-11-14 | Discharge: 2021-11-14 | Disposition: A | Discharge status: Home / Self Care | Payer: Commercial Managed Care - PPO | Source: Ambulatory Visit | Attending: Family Medicine | Admitting: Family Medicine

## 2021-11-14 DIAGNOSIS — Z1231 Encounter for screening mammogram for malignant neoplasm of breast: Secondary | ICD-10-CM | POA: Insufficient documentation

## 2021-11-18 ENCOUNTER — Ambulatory Visit
Admission: EM | Admit: 2021-11-18 | Discharge: 2021-11-18 | Disposition: A | Discharge status: Home / Self Care | Payer: Commercial Managed Care - PPO | Attending: Family Medicine | Admitting: Family Medicine

## 2021-11-18 ENCOUNTER — Other Ambulatory Visit: Payer: Self-pay

## 2021-11-18 DIAGNOSIS — S0181XA Laceration without foreign body of other part of head, initial encounter: Secondary | ICD-10-CM

## 2021-11-18 NOTE — ED Triage Notes (Signed)
Patient states that this morning she fell and hit the corner of her desk.  Patient states she came in because she can not control the bleeding and a little dizzy  Denies Meds

## 2021-11-22 DIAGNOSIS — S0181XA Laceration without foreign body of other part of head, initial encounter: Secondary | ICD-10-CM

## 2021-11-22 NOTE — ED Provider Notes (Signed)
[facility]    CSN: [id number] Arrival date & time: 11/18/21  1101      History   Chief Complaint Chief Complaint  Patient presents with   forehead injury    HPI Allison Hart is a 61 y.o. female.   Presenting today with a laceration to left forehead after she stumbled and hit her forehead on the corner of the desk a few hours ago. She has held pressure and washed the area but states it is still bleeding. Just after it happened she felt slightly dizzy but that has dissipated. Also denies no LOC, worst headache of life, N/V, mental status changes.    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   Allergic rhinitis 02/21/2012   CARDIOVASCULAR FUNCTION STUDY, ABNORMAL 07/26/2010   Hyperlipidemia LDL goal <100 08/19/2009   Obesity (BMI 30.0-34.9) 02/12/2008   Essential hypertension 02/12/2008    Past Surgical History:  Procedure Laterality Date   BIOPSY  08/29/2018   Procedure: BIOPSY;  Surgeon: Daneil Dolin, MD;  [facility];  Service: Endoscopy;;  gastric   COLONOSCOPY  11/13/2012   Procedure: COLONOSCOPY;  Surgeon: Rogene Houston, MD;  [facility];  Service: Endoscopy;  Laterality: N/A;  930   embolization of uterine artery X 2     ESOPHAGOGASTRODUODENOSCOPY (EGD) WITH PROPOFOL N/A 08/29/2018   Procedure: ESOPHAGOGASTRODUODENOSCOPY (EGD) WITH PROPOFOL;  Surgeon: Daneil Dolin,  MD;  [facility];  Service: Endoscopy;  Laterality: N/A;  3:00pm   INGUINAL LYMPH NODE BIOPSY Right 04/14/2016   Procedure: RIGHT INGUINAL LYMPH NODE BIOPSY;  Surgeon: Aviva Signs, MD;  [facility];  Service: General;  Laterality: Right;   IR ANGIO INTRA EXTRACRAN SEL COM CAROTID INNOMINATE BILAT MOD SED  02/12/2017   IR ANGIO VERTEBRAL SEL VERTEBRAL BILAT MOD SED  02/12/2017   IR GENERIC HISTORICAL  04/27/2016   IR RADIOLOGIST EVAL & MGMT 04/27/2016 Jacqulynn Cadet, MD [facility]   IR GENERIC HISTORICAL  12/05/2016   IR RADIOLOGIST EVAL & MGMT 12/05/2016 Jacqulynn Cadet, MD [facility]   IR RADIOLOGIST EVAL & MGMT  01/22/2017   IR RADIOLOGIST EVAL & MGMT  08/06/2018   IR RADIOLOGIST EVAL & MGMT  08/19/2020   IR RADIOLOGIST EVAL & MGMT  09/07/2021   Left inguinal hernia repair and endometriosis  2007   No definitive records    Left parotid bx benign  2005   RADIOLOGY WITH ANESTHESIA N/A 01/18/2016   Procedure: RADIOLOGY WITH ANESTHESIA;  Surgeon: Luanne Bras, MD;  Location: [place];  Service: Radiology;  Laterality: N/A;   RADIOLOGY WITH ANESTHESIA N/A 02/12/2017   Procedure: Cherie Dark EMBOLIZATION;  Surgeon: Luanne Bras, MD;  Location: [place];  Service: Radiology;  Laterality: N/A;   renal artery aneurysm repair right Right 05/6577   UMBILICAL HERNIA  REPAIR  infancy     OB History     Gravida  1   Para      Term      Preterm      AB      Living         SAB      IAB      Ectopic      Multiple      Live Births               Home Medications    Prior to Admission medications   Medication Sig Start Date End Date Taking? Authorizing Provider  amlodipine-olmesartan (AZOR) 10-20 MG tablet Take 1 tablet by mouth daily. 10/21/21   Fayrene Helper, MD  aspirin EC 81 MG tablet Take 1 tablet (81 mg total) by mouth daily. 07/30/18   Fayrene Helper, MD  azelastine (ASTELIN) 0.1 % nasal spray Place 2 sprays into both nostrils 2 (two)  times daily. Use in each nostril as directed 10/21/21   Fayrene Helper, MD  cholecalciferol (VITAMIN D) 1000 units tablet Take 1,000 Units by mouth daily.    [provider]  fluticasone (FLONASE) 50 MCG/ACT nasal spray Place 2 sprays into both nostrils daily. 10/21/21   Fayrene Helper, MD  furosemide (LASIX) 40 MG tablet Take 1 tablet by mouth once daily 08/01/21   Fayrene Helper, MD  loratadine [facility] ALLERGY RELIEF) 10 MG tablet Take 1 tablet by mouth once daily 08/29/21   Fayrene Helper, MD  montelukast (SINGULAIR) 10 MG tablet Take 1 tablet (10 mg total) by mouth at bedtime. 10/21/21   Fayrene Helper, MD  potassium chloride SA (KLOR-CON M) 20 MEQ tablet Take 1 tablet by mouth twice daily 10/10/21   Fayrene Helper, MD  Pyridoxine HCl (VITAMIN B-6 PO) Take 1 tablet by mouth daily.    [provider]  spironolactone (ALDACTONE) 25 MG tablet Take 1 tablet by mouth once daily 09/22/21   Fayrene Helper, MD    Family History Family History  Problem Relation Age of Onset   Hypertension Mother    Hyperlipidemia Mother    Thyroid disease Mother    Coronary artery disease Mother    Irregular heart beat Mother    Fibroids Sister    Hypertension Sister    Ulcerative colitis Son    Colon cancer Neg Hx     Social History Social History   Tobacco Use   Smoking status: Former    Packs/day: 0.25    Years: 5.00    Pack years: 1.25    Types: Cigarettes    Quit date: 1997    Years since quitting: 26.1   Smokeless tobacco: Never  Vaping Use   Vaping Use: Never used  Substance Use Topics   Alcohol use: Yes    Comment: rare   Drug use: No     Allergies   Versed [midazolam] and Sulfa antibiotics   Review of Systems Review of Systems PER HPI  Physical Exam Triage Vital Signs ED Triage Vitals  Enc Vitals Group     BP 11/18/21 1248 130/86     Pulse Rate 11/18/21 1248 76     Resp 11/18/21 1248 18     Temp 11/18/21 1248 98.7 F (37.1  C)     Temp Source 11/18/21 1248 Oral     SpO2 11/18/21 1248 97 %     Weight --      Height --  Head Circumference --      Peak Flow --      Pain Score 11/18/21 1245 6     Pain Loc --      Pain Edu? --      Excl. in [place]? --    No data found.  Updated Vital Signs BP 130/86 (BP Location: Right Arm)    Pulse 76    Temp 98.7 F (37.1 C) (Oral)    Resp 18    LMP  (LMP Unknown)    SpO2 97%   Visual Acuity Right Eye Distance:   Left Eye Distance:   Bilateral Distance:    Right Eye Near:   Left Eye Near:    Bilateral Near:     Physical Exam Vitals and nursing note reviewed.  Constitutional:      Appearance: Normal appearance. She is not ill-appearing.  HENT:     Head: Atraumatic.  Eyes:     Extraocular Movements: Extraocular movements intact.     Conjunctiva/sclera: Conjunctivae normal.  Cardiovascular:     Rate and Rhythm: Normal rate and regular rhythm.     Heart sounds: Normal heart sounds.  Pulmonary:     Effort: Pulmonary effort is normal.     Breath sounds: Normal breath sounds.  Musculoskeletal:        General: Normal range of motion.     Cervical back: Normal range of motion and neck supple.  Skin:    General: Skin is warm.     Comments: 0.5 cm well approximated superficial laceration to left forehead, bleeding now well controlled  Neurological:     General: No focal deficit present.     Mental Status: She is alert and oriented to person, place, and time.     Cranial Nerves: No cranial nerve deficit.     Motor: No weakness.     Gait: Gait normal.  Psychiatric:        Mood and Affect: Mood normal.        Thought Content: Thought content normal.        Judgment: Judgment normal.     UC Treatments / Results  Labs (all labs ordered are listed, but only abnormal results are displayed) Labs Reviewed - No data to display  EKG   Radiology No results found.  Procedures Laceration Repair  Date/Time: 11/22/2021 9:58 PM Performed by: Volney American, PA-C Authorized by: Volney American, PA-C   Consent:    Consent obtained:  Verbal   Consent given by:  Patient   Risks, benefits, and alternatives were discussed: yes     Risks discussed:  Pain, infection, poor wound healing and poor cosmetic result   Alternatives discussed:  Observation Universal protocol:    Procedure explained and questions answered to patient or proxy's satisfaction: yes     Relevant documents present and verified: yes     Immediately prior to procedure, a time out was called: yes     Patient identity confirmed:  Verbally with patient Anesthesia:    Anesthesia method:  None Laceration details:    Location:  Face   Face location:  Forehead   Length (cm):  0.5   Depth (mm):  2 Pre-procedure details:    Preparation:  Patient was prepped and draped in usual sterile fashion Exploration:    Limited defect created (wound extended): no     Hemostasis achieved with:  Direct pressure   Contaminated: no   Treatment:    Area cleansed with:  Chlorhexidine   Amount of cleaning:  Standard   Irrigation method:  Pressure wash   Visualized foreign bodies/material removed: no     Debridement:  None Skin repair:    Repair method:  Tissue adhesive Approximation:    Approximation:  Close Repair type:    Repair type:  Simple Post-procedure details:    Dressing:  Adhesive bandage (including critical care time)  Medications Ordered in UC Medications - No data to display  Initial Impression / Assessment and Plan / UC Course  I have reviewed the triage vital signs and the nursing notes.  Pertinent labs & imaging results that were available during my care of the patient were reviewed by me and considered in my medical decision making (see chart for details).     No neurologic deficits on exam, ED precautions reviewed for new or worsening neurologic sxs. Dermabond applied to laceration, tdap UTD 05/2020, discussed home wound care and return  precautions  Final Clinical Impressions(s) / UC Diagnoses   Final diagnoses:  Laceration of forehead, initial encounter   Discharge Instructions   None    ED Prescriptions   None    PDMP not reviewed this encounter.   Volney American, [place] 11/22/21 2202

## 2021-12-17 ENCOUNTER — Other Ambulatory Visit: Payer: Self-pay | Admitting: Family Medicine

## 2021-12-20 LAB — BMP8+EGFR
BUN/Creatinine Ratio: 19 (ref 9–23)
BUN: 17 mg/dL (ref 6–24)
CO2: 27 mmol/L (ref 20–29)
Calcium: 9.9 mg/dL (ref 8.7–10.2)
Chloride: 101 mmol/L (ref 96–106)
Creatinine, Ser: 0.88 mg/dL (ref 0.57–1.00)
Glucose: 86 mg/dL (ref 70–99)
Potassium: 4.1 mmol/L (ref 3.5–5.2)
Sodium: 142 mmol/L (ref 134–144)
eGFR: 76 mL/min/{1.73_m2} (ref >59)

## 2021-12-23 ENCOUNTER — Ambulatory Visit (INDEPENDENT_AMBULATORY_CARE_PROVIDER_SITE_OTHER): Payer: Commercial Managed Care - PPO | Admitting: Family Medicine

## 2021-12-23 ENCOUNTER — Other Ambulatory Visit: Payer: Self-pay

## 2021-12-23 ENCOUNTER — Encounter: Payer: Self-pay | Admitting: Family Medicine

## 2021-12-23 ENCOUNTER — Telehealth: Payer: Self-pay

## 2021-12-23 VITALS — BP 132/83 | HR 80 | Resp 16 | Ht 69.0 in | Wt 208.8 lb

## 2021-12-23 DIAGNOSIS — E785 Hyperlipidemia, unspecified: Secondary | ICD-10-CM

## 2021-12-23 DIAGNOSIS — E669 Obesity, unspecified: Secondary | ICD-10-CM | POA: Diagnosis not present

## 2021-12-23 DIAGNOSIS — I10 Essential (primary) hypertension: Secondary | ICD-10-CM | POA: Diagnosis not present

## 2021-12-23 MED ORDER — WEGOVY 0.5 MG/0.5ML ~~LOC~~ SOAJ: 0.5000 mg | SUBCUTANEOUS | 2 refills | Status: DC

## 2021-12-23 MED ORDER — SEMAGLUTIDE-WEIGHT MANAGEMENT 0.25 MG/0.5ML ~~LOC~~ SOAJ: 0.2500 mg | SUBCUTANEOUS | 0 refills | Status: DC

## 2021-12-23 NOTE — Telephone Encounter (Signed)
Med is approved ?

## 2021-12-23 NOTE — Patient Instructions (Signed)
F/u in 10 weeks, call if you need me sooner ? ?BP is good ? ?Congrats on weight loss, keep it up! ? ?New to help with weight loss is once weekly wegovy, starting at 0.25 mg weekly, then increase to 0.5 mg weekly after 1 month ? ?VITAL that you commit to changing how AND WHAT YOU EAT , AND DAILY EXERCISE FOR 30 MINS AT LEAST  5 DAYS PER WEEK ? ?Thanks for choosing [facility], we consider it a privelige to serve you. ? ?

## 2022-01-01 ENCOUNTER — Encounter: Payer: Self-pay | Admitting: Family Medicine

## 2022-01-01 NOTE — Assessment & Plan Note (Signed)
?  Patient re-educated about  the importance of commitment to a  minimum of 150 minutes of exercise per week as able. ? ?The importance of healthy food choices with portion control discussed, as well as eating regularly and within a 12 hour window most days. ?The need to choose "clean , green" food 50 to 75% of the time is discussed, as well as to make water the primary drink and set a goal of 64 ounces water daily. ? ?  ? ?  12/23/2021  ? 10:28 AM 10/31/2021  ?  2:22 PM 10/21/2021  ?  9:23 AM  ?Weight /BMI  ?Weight 208 lb 12.8 oz 213 lb 213 lb 1.9 oz  ?Height '5\' 9"'$  (1.753 m) '5\' 9"'$  (1.753 m) '5\' 9"'$  (1.753 m)  ?BMI 30.83 kg/m2 31.45 kg/m2 31.47 kg/m2  ?start weekly  wegovy and work on lifestyle change ? ? ?

## 2022-01-01 NOTE — Assessment & Plan Note (Signed)
Hyperlipidemia:Low fat diet discussed and encouraged. ? ? ?Lipid Panel  ?Lab Results  ?Component Value Date  ? CHOL 233 (H) 04/06/2020  ? HDL 53 04/06/2020  ? [place] 162 (H) 04/06/2020  ? TRIG 79 04/06/2020  ? CHOLHDL 4.4 04/06/2020  ? ? ? ?Updated lab needed at/ before next visit. ? ?

## 2022-01-01 NOTE — Progress Notes (Signed)
? ?Allison [place]     MRN: [id number]      DOB: 07-07-1961 ? ? ?HPI ?[name] is here for follow up and re-evaluation of chronic medical conditions, medication management and review of any available recent lab and radiology data.  ?Preventive health is updated, specifically  Cancer screening and Immunization.   ?Questions or concerns regarding consultations or procedures which the PT has had in the interim are  addressed. ?The PT denies any adverse reactions to current medications since the last visit.  ?There are no new concerns.  ?There are no specific complaints  ? ?ROS ?Denies recent fever or chills. ?Denies sinus pressure, nasal congestion, ear pain or sore throat. ?Denies chest congestion, productive cough or wheezing. ?Denies chest pains, palpitations and leg swelling ?Denies abdominal pain, nausea, vomiting,diarrhea or constipation.   ?Denies dysuria, frequency, hesitancy or incontinence. ?Denies joint pain, swelling and limitation in mobility. ?Denies headaches, seizures, numbness, or tingling. ?Denies depression, anxiety or insomnia. ?Denies skin break down or rash. ? ? ?PE ? ?BP 132/83   Pulse 80   Resp 16   Ht '5\' 9"'$  (1.753 m)   Wt 208 lb 12.8 oz (94.7 kg)   LMP  (LMP Unknown)   BMI 30.83 kg/m?  ? ?Patient alert and oriented and in no cardiopulmonary distress. ? ?HEENT: No facial asymmetry, EOMI,     Neck supple . ? ?Chest: Clear to auscultation bilaterally. ? ?CVS: S1, S2 no murmurs, no S3.Regular rate. ? ?ABD: Soft non tender.  ? ?Ext: No edema ? ?MS: Adequate ROM spine, shoulders, hips and knees. ? ?Skin: Intact, no ulcerations or rash noted. ? ?Psych: Good eye contact, normal affect. Memory intact not anxious or depressed appearing. ? ?CNS: CN 2-12 intact, power,  normal throughout.no focal deficits noted. ? ? ?Assessment & Plan ? ?Essential hypertension ?Controlled, no change in medication ?DASH diet and commitment to daily physical activity for a minimum of 30 minutes discussed and  encouraged, as a part of hypertension management. ?The importance of attaining a healthy weight is also discussed. ? ? ?  12/23/2021  ? 10:28 AM 11/18/2021  ? 12:48 PM 10/31/2021  ?  2:22 PM 10/21/2021  ?  9:23 AM 04/06/2021  ?  2:31 PM 09/21/2020  ?  1:08 PM 07/21/2020  ?  3:10 PM  ?BP/Weight  ?Systolic BP 003 704 888 916 138 130 128  ?Diastolic BP 83 86 86 92 92 89 85  ?Wt. (Lbs) 208.8  213 213.12 209 214 217  ?BMI 30.83 kg/m2  31.45 kg/m2 31.47 kg/m2 30.86 kg/m2 31.6 kg/m2 32.05 kg/m2  ? ? ? ? ? ?Obesity (BMI 30.0-34.9) ? ?Patient re-educated about  the importance of commitment to a  minimum of 150 minutes of exercise per week as able. ? ?The importance of healthy food choices with portion control discussed, as well as eating regularly and within a 12 hour window most days. ?The need to choose "clean , green" food 50 to 75% of the time is discussed, as well as to make water the primary drink and set a goal of 64 ounces water daily. ? ?  ? ?  12/23/2021  ? 10:28 AM 10/31/2021  ?  2:22 PM 10/21/2021  ?  9:23 AM  ?Weight /BMI  ?Weight 208 lb 12.8 oz 213 lb 213 lb 1.9 oz  ?Height '5\' 9"'$  (1.753 m) '5\' 9"'$  (1.753 m) '5\' 9"'$  (1.753 m)  ?BMI 30.83 kg/m2 31.45 kg/m2 31.47 kg/m2  ?start weekly  wegovy and work  on lifestyle change ? ? ? ?Hyperlipidemia LDL goal <100 ?Hyperlipidemia:Low fat diet discussed and encouraged. ? ? ?Lipid Panel  ?Lab Results  ?Component Value Date  ? CHOL 233 (H) 04/06/2020  ? HDL 53 04/06/2020  ? [place] 162 (H) 04/06/2020  ? TRIG 79 04/06/2020  ? CHOLHDL 4.4 04/06/2020  ? ? ? ?Updated lab needed at/ before next visit. ? ? ?

## 2022-01-01 NOTE — Assessment & Plan Note (Signed)
Controlled, no change in medication ?DASH diet and commitment to daily physical activity for a minimum of 30 minutes discussed and encouraged, as a part of hypertension management. ?The importance of attaining a healthy weight is also discussed. ? ? ?  12/23/2021  ? 10:28 AM 11/18/2021  ? 12:48 PM 10/31/2021  ?  2:22 PM 10/21/2021  ?  9:23 AM 04/06/2021  ?  2:31 PM 09/21/2020  ?  1:08 PM 07/21/2020  ?  3:10 PM  ?BP/Weight  ?Systolic BP 025 427 062 376 138 130 128  ?Diastolic BP 83 86 86 92 92 89 85  ?Wt. (Lbs) 208.8  213 213.12 209 214 217  ?BMI 30.83 kg/m2  31.45 kg/m2 31.47 kg/m2 30.86 kg/m2 31.6 kg/m2 32.05 kg/m2  ? ? ? ? ?

## 2022-01-15 ENCOUNTER — Other Ambulatory Visit: Payer: Self-pay | Admitting: Family Medicine

## 2022-03-03 ENCOUNTER — Ambulatory Visit (INDEPENDENT_AMBULATORY_CARE_PROVIDER_SITE_OTHER): Payer: Commercial Managed Care - PPO | Admitting: Family Medicine

## 2022-03-03 VITALS — BP 130/86 | HR 86 | Resp 16 | Ht 69.0 in | Wt 198.4 lb

## 2022-03-03 DIAGNOSIS — E669 Obesity, unspecified: Secondary | ICD-10-CM | POA: Diagnosis not present

## 2022-03-03 DIAGNOSIS — J3089 Other allergic rhinitis: Secondary | ICD-10-CM

## 2022-03-03 DIAGNOSIS — E559 Vitamin D deficiency, unspecified: Secondary | ICD-10-CM | POA: Diagnosis not present

## 2022-03-03 DIAGNOSIS — I10 Essential (primary) hypertension: Secondary | ICD-10-CM

## 2022-03-03 DIAGNOSIS — E785 Hyperlipidemia, unspecified: Secondary | ICD-10-CM | POA: Diagnosis not present

## 2022-03-03 DIAGNOSIS — E66811 Obesity, class 1: Secondary | ICD-10-CM

## 2022-03-03 DIAGNOSIS — I272 Pulmonary hypertension, unspecified: Secondary | ICD-10-CM

## 2022-03-03 MED ORDER — SEMAGLUTIDE-WEIGHT MANAGEMENT 0.5 MG/0.5ML ~~LOC~~ SOAJ: 0.5000 mg | SUBCUTANEOUS | 3 refills | Status: DC

## 2022-03-03 NOTE — Patient Instructions (Signed)
Annual exam in 13 weeks, call if you need me sooner  Increase plant based eating with protein  Need to reduce fried and fatty foods, cheese, shrimp, butter, egg yolks  Fasting cBC, cmp and eGFR, tSH and vitD 5 days before next appointment  It is important that you exercise regularly at least 30 minutes 5 times a week. If you develop chest pain, have severe difficulty breathing, or feel very tired, stop exercising immediately and seek medical attention   cONGRATS on excellent weight loss, work on food choice and exercise for max beneit  Thanks for choosing [facility], we consider it a privelige to serve you.

## 2022-03-05 ENCOUNTER — Encounter: Payer: Self-pay | Admitting: Family Medicine

## 2022-03-05 NOTE — Assessment & Plan Note (Signed)
Improved, continue med mangaemnt  Patient re-educated about  the importance of commitment to a  minimum of 150 minutes of exercise per week as able.  The importance of healthy food choices with portion control discussed, as well as eating regularly and within a 12 hour window most days. The need to choose "clean , green" food 50 to 75% of the time is discussed, as well as to make water the primary drink and set a goal of 64 ounces water daily.       03/03/2022    8:42 AM 12/23/2021   10:28 AM 10/31/2021    2:22 PM  Weight /BMI  Weight 198 lb 6.4 oz 208 lb 12.8 oz 213 lb  Height '5\' 9"'$  (1.753 m) '5\' 9"'$  (1.753 m) '5\' 9"'$  (1.753 m)  BMI 29.3 kg/m2 30.83 kg/m2 31.45 kg/m2

## 2022-03-05 NOTE — Progress Notes (Signed)
Allison Hart     MRN: [id number]      DOB: 1961-02-21   HPI Allison Hart is here for follow up and re-evaluation of chronic medical conditions, medication management and review of any available recent lab and radiology data.  Preventive health is updated, specifically  Cancer screening and Immunization.   Questions or concerns regarding consultations or procedures which the PT has had in the interim are  addressed. The PT denies any adverse reactions to current medications since the last visit.  There are no new concerns.  There are no specific complaints   ROS Denies recent fever or chills. Denies sinus pressure, nasal congestion, ear pain or sore throat. Denies chest congestion, productive cough or wheezing. Denies chest pains, palpitations and leg swelling Denies abdominal pain, nausea, vomiting,diarrhea or constipation.   Denies dysuria, frequency, hesitancy or incontinence. Denies joint pain, swelling and limitation in mobility. Denies headaches, seizures, numbness, or tingling. Denies depression, anxiety or insomnia. Denies skin break down or rash.   PE  BP 130/86   Pulse 86   Resp 16   Ht '5\' 9"'$  (1.753 m)   Wt 198 lb 6.4 oz (90 kg)   LMP  (LMP Unknown)   SpO2 92%   BMI 29.30 kg/m   Patient alert and oriented and in no cardiopulmonary distress.  HEENT: No facial asymmetry, EOMI,     Neck supple .  Chest: Clear to auscultation bilaterally.  CVS: S1, S2 no murmurs, no S3.Regular rate.  ABD: Soft non tender.   Ext: No edema  MS: Adequate ROM spine, shoulders, hips and knees.  Skin: Intact, no ulcerations or rash noted.  Psych: Good eye contact, normal affect. Memory intact not anxious or depressed appearing.  CNS: CN 2-12 intact, power,  normal throughout.no focal deficits noted.   Assessment & Plan  Essential hypertension DASH diet and commitment to daily physical activity for a minimum of 30 minutes discussed and encouraged, as a part of  hypertension management. The importance of attaining a healthy weight is also discussed.     03/03/2022    8:42 AM 12/23/2021   10:28 AM 11/18/2021   12:48 PM 10/31/2021    2:22 PM 10/21/2021    9:23 AM 04/06/2021    2:31 PM 09/21/2020    1:08 PM  BP/Weight  Systolic BP 496 759 163 846 659 935 701  Diastolic BP 86 83 86 86 92 92 89  Wt. (Lbs) 198.4 208.8  213 213.12 209 214  BMI 29.3 kg/m2 30.83 kg/m2  31.45 kg/m2 31.47 kg/m2 30.86 kg/m2 31.6 kg/m2       Obesity (BMI 30.0-34.9) Improved, continue med mangaemnt  Patient re-educated about  the importance of commitment to a  minimum of 150 minutes of exercise per week as able.  The importance of healthy food choices with portion control discussed, as well as eating regularly and within a 12 hour window most days. The need to choose "clean , green" food 50 to 75% of the time is discussed, as well as to make water the primary drink and set a goal of 64 ounces water daily.       03/03/2022    8:42 AM 12/23/2021   10:28 AM 10/31/2021    2:22 PM  Weight /BMI  Weight 198 lb 6.4 oz 208 lb 12.8 oz 213 lb  Height '5\' 9"'$  (1.753 m) '5\' 9"'$  (1.753 m) '5\' 9"'$  (1.753 m)  BMI 29.3 kg/m2 30.83 kg/m2 31.45 kg/m2  Hyperlipidemia LDL goal <100 Uncontrolled, total and LDL elevated, needs to increase plant based diet  Allergic rhinitis Controlled, no change in medication

## 2022-03-05 NOTE — Assessment & Plan Note (Signed)
Controlled, no change in medication  

## 2022-03-05 NOTE — Assessment & Plan Note (Signed)
DASH diet and commitment to daily physical activity for a minimum of 30 minutes discussed and encouraged, as a part of hypertension management. The importance of attaining a healthy weight is also discussed.     03/03/2022    8:42 AM 12/23/2021   10:28 AM 11/18/2021   12:48 PM 10/31/2021    2:22 PM 10/21/2021    9:23 AM 04/06/2021    2:31 PM 09/21/2020    1:08 PM  BP/Weight  Systolic BP 852 778 242 353 614 431 540  Diastolic BP 86 83 86 86 92 92 89  Wt. (Lbs) 198.4 208.8  213 213.12 209 214  BMI 29.3 kg/m2 30.83 kg/m2  31.45 kg/m2 31.47 kg/m2 30.86 kg/m2 31.6 kg/m2

## 2022-03-05 NOTE — Assessment & Plan Note (Signed)
Uncontrolled, total and LDL elevated, needs to increase plant based diet

## 2022-03-06 ENCOUNTER — Other Ambulatory Visit: Payer: Self-pay | Admitting: Family Medicine

## 2022-03-12 ENCOUNTER — Other Ambulatory Visit: Payer: Self-pay | Admitting: Family Medicine

## 2022-03-28 ENCOUNTER — Other Ambulatory Visit: Payer: Self-pay | Admitting: Family Medicine

## 2022-03-29 ENCOUNTER — Encounter: Payer: Self-pay | Admitting: Family Medicine

## 2022-04-07 ENCOUNTER — Other Ambulatory Visit: Payer: Self-pay | Admitting: Family Medicine

## 2022-04-07 MED ORDER — PHENTERMINE HCL 15 MG PO CAPS: 15.0000 mg | ORAL_CAPSULE | ORAL | 2 refills | Status: DC

## 2022-04-08 ENCOUNTER — Other Ambulatory Visit: Payer: Self-pay | Admitting: Family Medicine

## 2022-04-29 ENCOUNTER — Other Ambulatory Visit: Payer: Self-pay | Admitting: Family Medicine

## 2022-05-23 ENCOUNTER — Telehealth: Payer: Self-pay | Admitting: Family Medicine

## 2022-05-23 NOTE — Telephone Encounter (Signed)
Pls add fasting lipid panel to labs ordered I May, let her knoweye dr is concerned that blood pressure and cholesterol are well controlled, I will see her in 2 weeks, needs fasting labs before visit and bring meds she is taking tovisit

## 2022-05-24 ENCOUNTER — Other Ambulatory Visit: Payer: Self-pay | Admitting: Family Medicine

## 2022-05-24 DIAGNOSIS — E785 Hyperlipidemia, unspecified: Secondary | ICD-10-CM

## 2022-05-24 DIAGNOSIS — E559 Vitamin D deficiency, unspecified: Secondary | ICD-10-CM

## 2022-05-24 DIAGNOSIS — I10 Essential (primary) hypertension: Secondary | ICD-10-CM

## 2022-05-24 NOTE — Telephone Encounter (Signed)
LMTRC

## 2022-05-24 NOTE — Telephone Encounter (Signed)
Pt willpick up lab order

## 2022-06-08 ENCOUNTER — Encounter: Payer: Self-pay | Admitting: Family Medicine

## 2022-06-08 ENCOUNTER — Ambulatory Visit (INDEPENDENT_AMBULATORY_CARE_PROVIDER_SITE_OTHER): Payer: Commercial Managed Care - PPO | Admitting: Family Medicine

## 2022-06-08 VITALS — BP 130/88 | HR 87 | Resp 16 | Ht 69.0 in | Wt 194.0 lb

## 2022-06-08 DIAGNOSIS — E669 Obesity, unspecified: Secondary | ICD-10-CM

## 2022-06-08 DIAGNOSIS — J3089 Other allergic rhinitis: Secondary | ICD-10-CM

## 2022-06-08 DIAGNOSIS — Z9109 Other allergy status, other than to drugs and biological substances: Secondary | ICD-10-CM | POA: Diagnosis not present

## 2022-06-08 DIAGNOSIS — R0683 Snoring: Secondary | ICD-10-CM | POA: Insufficient documentation

## 2022-06-08 DIAGNOSIS — Z23 Encounter for immunization: Secondary | ICD-10-CM

## 2022-06-08 DIAGNOSIS — Z0001 Encounter for general adult medical examination with abnormal findings: Secondary | ICD-10-CM | POA: Insufficient documentation

## 2022-06-08 DIAGNOSIS — R748 Abnormal levels of other serum enzymes: Secondary | ICD-10-CM | POA: Diagnosis not present

## 2022-06-08 DIAGNOSIS — R7989 Other specified abnormal findings of blood chemistry: Secondary | ICD-10-CM | POA: Diagnosis not present

## 2022-06-08 DIAGNOSIS — I10 Essential (primary) hypertension: Secondary | ICD-10-CM

## 2022-06-08 NOTE — Assessment & Plan Note (Signed)
Refer GI 

## 2022-06-08 NOTE — Patient Instructions (Addendum)
F/U in 3 months, call if you need me sooner  Flu vaccine today  Please STOP phentermine  It is important that you exercise regularly at least 30 minutes 5 times a week. If you develop chest pain, have severe difficulty breathing, or feel very tired, stop exercising immediately and seek medical attention   You are referred to Pulmonary re snoring , please schedule at checkout  You are referred to Cardiology for re evaluation  You are referred to Allergist  You are referred to Dr Sydell Axon   Eat regularly and continue healthy food choice  Thanks for choosing [facility], we consider it a privelige to serve you.

## 2022-06-08 NOTE — Assessment & Plan Note (Signed)
Uncontrolled management, chronic nasal and sinus congestion despite multiple medications, refer allergist

## 2022-06-08 NOTE — Assessment & Plan Note (Signed)
Adequate though subotimal control  DASH diet and commitment to daily physical activity for a minimum of 30 minutes discussed and encouraged, as a part of hypertension management. The importance of attaining a healthy weight is also discussed.     06/08/2022   10:12 AM 06/08/2022    9:30 AM 03/03/2022    8:42 AM 12/23/2021   10:28 AM 11/18/2021   12:48 PM 10/31/2021    2:22 PM 10/21/2021    9:23 AM  BP/Weight  Systolic BP 259 102 890 228 406 986 148  Diastolic BP 88 79 86 83 86 86 92  Wt. (Lbs)  194 198.4 208.8  213 213.12  BMI  28.65 kg/m2 29.3 kg/m2 30.83 kg/m2  31.45 kg/m2 31.47 kg/m2     Stop phentermine, card referral forupdated echo an eval

## 2022-06-08 NOTE — Assessment & Plan Note (Signed)

## 2022-06-08 NOTE — Assessment & Plan Note (Signed)
Snoring, possible pulmonary hTN, stop phentermine and efer to [employer]

## 2022-06-08 NOTE — Progress Notes (Signed)
    Allison Hart     MRN: [id number]      DOB: 03-16-1961  HPI: Patient is in for annual physical exam. C/o snoring and past dx  of pulmonary hypertension Doing well on weight loss but may be eating insufficient, low sugar on recent.lab Lab shows elevated allk phos and liver enzyme needs GI eval Immunization is reviewed , and  updated .   PE: BP 130/88   Pulse 87   Resp 16   Ht '5\' 9"'$  (1.753 m)   Wt 194 lb (88 kg)   LMP  (LMP Unknown)   SpO2 96%   BMI 28.65 kg/m   Pleasant  female, alert and oriented x 3, in no cardio-pulmonary distress. Afebrile. HEENT No facial trauma or asymetry. Sinuses non tender.  Extra occullar muscles intact.. External ears normal, . Neck: supple, no adenopathy,JVD or thyromegaly.No bruits.Tender over TMJ, left greater than right  Chest: Clear to ascultation bilaterally.No crackles or wheezes. Non tender to palpation  Breast: No asymetry,no masses or lumps. No tenderness. No nipple discharge or inversion. No axillary or supraclavicular adenopathy  Cardiovascular system; Heart sounds normal,  S1 and  S2 ,no S3.  No murmur, or thrill. Apical beat not displaced Peripheral pulses normal.  Abdomen: Soft, non tender, no organomegaly or masses. No bruits. Bowel sounds normal. No guarding, tenderness or rebound.    Musculoskeletal exam: Full ROM of spine, hips , shoulders and knees. No deformity ,swelling or crepitus noted. No muscle wasting or atrophy.   Neurologic: Cranial nerves 2 to 12 intact. Power, tone ,sensation and reflexes normal throughout. No disturbance in gait. No tremor.  Skin: Intact, no ulceration, erythema , scaling or rash noted. Pigmentation normal throughout  Psych; Normal mood and affect. Judgement and concentration normal   Assessment & Plan:  Essential hypertension Adequate though subotimal control  DASH diet and commitment to daily physical activity for a minimum of 30 minutes discussed and encouraged,  as a part of hypertension management. The importance of attaining a healthy weight is also discussed.     06/08/2022   10:12 AM 06/08/2022    9:30 AM 03/03/2022    8:42 AM 12/23/2021   10:28 AM 11/18/2021   12:48 PM 10/31/2021    2:22 PM 10/21/2021    9:23 AM  BP/Weight  Systolic BP 010 272 536 644 034 742 595  Diastolic BP 88 79 86 83 86 86 92  Wt. (Lbs)  194 198.4 208.8  213 213.12  BMI  28.65 kg/m2 29.3 kg/m2 30.83 kg/m2  31.45 kg/m2 31.47 kg/m2     Stop phentermine, card referral forupdated echo an eval  Allergic rhinitis Uncontrolled management, chronic nasal and sinus congestion despite multiple medications, refer allergist  Annual visit for general adult medical examination with abnormal findings Annual exam as documented. Counseling done  re healthy lifestyle involving commitment to 150 minutes exercise per week, heart healthy diet, and attaining healthy weight.The importance of adequate sleep also discussed. Regular seat belt use and home safety, is also discussed. Changes in health habits are decided on by the patient with goals and time frames  set for achieving them. Immunization and cancer screening needs are specifically addressed at this visit.   Elevated alkaline phosphatase level Refer GI  Elevated LFTs Refer GI  Snoring Snoring, possible pulmonary hTN, stop phentermine and efer to [employer]

## 2022-06-14 ENCOUNTER — Ambulatory Visit (INDEPENDENT_AMBULATORY_CARE_PROVIDER_SITE_OTHER): Payer: Commercial Managed Care - PPO | Admitting: Pulmonary Disease

## 2022-06-14 ENCOUNTER — Encounter: Payer: Self-pay | Admitting: Pulmonary Disease

## 2022-06-14 ENCOUNTER — Encounter: Payer: Self-pay | Admitting: Family Medicine

## 2022-06-14 VITALS — BP 132/84 | HR 74 | Temp 98.0°F | Ht 69.0 in | Wt 195.6 lb

## 2022-06-14 DIAGNOSIS — R0683 Snoring: Secondary | ICD-10-CM

## 2022-06-14 NOTE — Patient Instructions (Signed)
Will arrange for home sleep study Will call to arrange for follow up after sleep study reviewed  

## 2022-06-14 NOTE — Progress Notes (Signed)
[facility]  Chief Complaint  Patient presents with   Consult    Snoring    Past Surgical History:  She  has a past surgical history that includes Umbilical hernia repair (infancy ); Left inguinal hernia repair and endometriosis (2007); Left parotid bx benign (2005); embolization of uterine artery X 2; Colonoscopy (11/13/2012); Radiology with anesthesia (N/A, 01/18/2016); renal artery aneurysm repair right (Right, 01/2016); Inguinal lymph node biopsy (Right, 04/14/2016); ir generic historical (04/27/2016); ir generic historical (12/05/2016); IR Radiologist Eval & Mgmt (01/22/2017); IR ANGIO VERTEBRAL SEL VERTEBRAL BILAT MOD SED (02/12/2017); IR ANGIO INTRA EXTRACRAN SEL COM CAROTID INNOMINATE BILAT MOD SED (02/12/2017); Radiology with anesthesia (N/A, 02/12/2017); IR Radiologist Eval & Mgmt (08/06/2018); Esophagogastroduodenoscopy (egd) with propofol (N/A, 08/29/2018); biopsy (08/29/2018); IR Radiologist Eval & Mgmt (08/19/2020); and IR Radiologist Eval & Mgmt (09/07/2021).  Past Medical History:  Anxiety, Arthritis, HTN, GERD, Nephrolithiasis, Rt renal artery aneurysm, Allergies  Constitutional:  BP 132/84 (BP Location: Left Arm, Patient Position: Sitting)   Pulse 74   Temp 98 F (36.7 C) (Temporal)   Ht '5\' 9"'$  (1.753 m)   Wt 195 lb 9.6 oz (88.7 kg)   LMP  (LMP Unknown)   SpO2 98%   BMI 28.89 kg/m   Brief Summary:  Allison Hart is a 61 y.o. female former smoker with snoring      Subjective:   She has snored as long as she can remember.  She was recently told by her dentist that she grinds her teeth and she clenches her teeth at night.  She has trouble with allergies with sinus congestion.  She has been using an adjustable bed and sleeps with her head up.  She had an Echo from 2018 that showed elevated pulmonary artery pressures.  She feels tired during the day and has trouble staying focused when working on a computer.  She goes to sleep between  930 and 1130 pm.  She falls asleep in 15 to 40 minutes.  She wakes up 2 to 4 times to use the bathroom.  She gets out of bed between 545 and 645 am.  She feels tired in the morning, and this feeling lasts through the day.  She denies morning headache.  She sometimes needs to take tylenol to help with joint pains before going to sleep.  She drinks a 20 ounce cup of coffee in the morning.  She denies sleep walking, sleep talking, or nightmares.  There is no history of restless legs.  She denies sleep hallucinations, sleep paralysis, or cataplexy.  The Epworth score is 3 out of 24.   Physical Exam:   Appearance - well kempt   ENMT - no sinus tenderness, no oral exudate, no LAN, Mallampati 4 airway, no stridor  Respiratory - equal breath sounds bilaterally, no wheezing or rales  CV - s1s2 regular rate and rhythm, no murmurs  Ext - no clubbing, no edema  Skin - no rashes  Psych - normal mood and affect   Sleep Tests:    Cardiac Tests:  Echo 03/28/17 >> EF 55 to 60%, PAS 46 mmHg  Social History:  She  reports that she quit smoking about 26 years ago. Her smoking use included cigarettes. She has a 1.25 pack-year smoking history. She has never used smokeless tobacco. She reports current alcohol use. She reports that she does not use drugs.  Family History:  Her family history includes Coronary artery disease in her mother; Fibroids in her  sister; Hyperlipidemia in her mother; Hypertension in her mother and sister; Irregular heart beat in her mother; Thyroid disease in her mother; Ulcerative colitis in her son.    Discussion:  She snoring, sleep disruption, apnea and daytime sleepiness.  She history of hypertension and elevated pulmonary pressures on previous Echo.  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  I am concerned she could have obstructive sleep apnea.  Assessment/Plan:   Snoring with excessive daytime sleepiness. - will need to arrange for a home sleep  study  Obesity. - discussed how weight can impact sleep and risk for sleep disordered breathing - discussed options to assist with weight loss: combination of diet modification, cardiovascular and strength training exercises  Cardiovascular risk. - had an extensive discussion regarding the adverse health consequences related to untreated sleep disordered breathing - specifically discussed the risks for hypertension, coronary artery disease, cardiac dysrhythmias, cerebrovascular disease, and diabetes - lifestyle modification discussed  Safe driving practices. - discussed how sleep disruption can increase risk of accidents, particularly when driving - safe driving practices were discussed  Therapies for obstructive sleep apnea. - if the sleep study shows significant sleep apnea, then various therapies for treatment were reviewed: CPAP, oral appliance, and surgical interventions  Time Spent Involved in Patient Care on Day of Examination:  37 minutes  Follow up:   Patient Instructions  Will arrange for home sleep study Will call to arrange for follow up after sleep study reviewed   Medication List:   Allergies as of 06/14/2022       Reactions   Versed [midazolam] Shortness Of Breath, Other (See Comments)   Sweating, anxious and "I felt like I was going to pass out"   Sulfa Antibiotics         Medication List        Accurate as of June 14, 2022 11:01 AM. If you have any questions, ask your nurse or doctor.          amlodipine-olmesartan 10-20 MG tablet Commonly known as: AZOR TAKE 1 TABLET BY MOUTH ONCE DAILY **STOP  AMLODIPINE  10  MG**   aspirin EC 81 MG tablet Take 1 tablet (81 mg total) by mouth daily.   azelastine 0.1 % nasal spray Commonly known as: ASTELIN Place 2 sprays into both nostrils 2 (two) times daily. Use in each nostril as directed   cholecalciferol 25 MCG (1000 UNIT) tablet Commonly known as: VITAMIN D3 Take 1,000 Units by mouth daily.    fluticasone 50 MCG/ACT nasal spray Commonly known as: FLONASE Use 2 spray(s) in each nostril once daily   furosemide 40 MG tablet Commonly known as: LASIX Take 1 tablet by mouth once daily   loratadine 10 MG tablet Commonly known as: CLARITIN Take 1 tablet by mouth once daily   montelukast 10 MG tablet Commonly known as: SINGULAIR TAKE 1 TABLET BY MOUTH AT BEDTIME   potassium chloride SA 20 MEQ tablet Commonly known as: KLOR-CON M Take 1 tablet by mouth twice daily   spironolactone 25 MG tablet Commonly known as: ALDACTONE Take 1 tablet by mouth once daily   VITAMIN B-6 PO Take 1 tablet by mouth daily.        Signature:  Chesley Mires, MD [place] Pager - [phone] 06/14/2022, 11:01 AM

## 2022-06-15 ENCOUNTER — Encounter: Payer: Self-pay | Admitting: *Deleted

## 2022-06-23 ENCOUNTER — Other Ambulatory Visit: Payer: Self-pay | Admitting: Family Medicine

## 2022-06-27 ENCOUNTER — Ambulatory Visit (INDEPENDENT_AMBULATORY_CARE_PROVIDER_SITE_OTHER): Payer: Commercial Managed Care - PPO | Admitting: Gastroenterology

## 2022-06-27 ENCOUNTER — Encounter: Payer: Self-pay | Admitting: Gastroenterology

## 2022-06-27 VITALS — BP 131/82 | HR 68 | Temp 97.7°F | Ht 69.0 in | Wt 194.6 lb

## 2022-06-27 DIAGNOSIS — R7989 Other specified abnormal findings of blood chemistry: Secondary | ICD-10-CM | POA: Diagnosis not present

## 2022-06-27 NOTE — Patient Instructions (Signed)
Please send copy of your liver labs to me via mychart message or by fax [phone]. I will be in touch with further recommendations once reviewed!

## 2022-06-27 NOTE — Telephone Encounter (Signed)
Allison Hart, I placed orders after patient left (after I was able view the missing lab results). I sent her a mychart message about having labs done. Can you send her a copy of the order request via mychart?

## 2022-06-27 NOTE — Progress Notes (Signed)
GI Office Note    Referring Provider: Fayrene Helper, MD Primary Care Physician:  Fayrene Helper, MD  Primary Gastroenterologist: Garfield Cornea, MD   Chief Complaint   Chief Complaint  Patient presents with   abnormal lab values     History of Present Illness   Allison Hart is a 61 y.o. female presenting today at the request of Dr. Moshe Cipro for further evaluation of elevated LFTs, alkaline phosphatase.  At the time of visit we do not have access to her most recent labs which were done by her employer.  Patient states she has had abnormal LFTs recently.  Denies any abdominal pain.  No appetite concerns.  No pruritus.  No heartburn or dysphagia.  Occasionally uses Dulcolax but for the most part her bowel movements are regular.  No melena or rectal bleeding.  No known family history of liver disease.  Her son had ulcerative colitis but died in his 72s from pancreatic cancer. Patient weighed 213 pounds back in January.  She was on [facility] for short period of time but had problems obtaining medication due to shortage.  She was able to lose some weight is down to 194 pounds today.   Labs available from June 2022: Total bilirubin 0.3, alkaline phosphatase 122H, AST 39, ALT 40H.  Her ALT was mildly elevated in 2018, 2021.    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 Pulsatile Component. No Biopsies taken. Abnormal gastric mucosa as described above?status post biopsy. In reviewing the record, back in 2017, patient was seen by oncology for intrathoracic adenopathy. She had significant adenopathy. Follow-up chest CT was recommended but apparently this was not done.  Hyperplastic gastric polyp.  Gastritis but no H. pylori.  Chest CT from December 2019 showed left pulmonary vein impinging on the esophagus likely the source of the findings.  Stable borderline enlarged supraclavicular and axillary lymph nodes bilaterally, requested PCP to  follow.  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 Right renal artery and stented segment remain patent. Excluded aneurysm remains thrombosed with similar size measurements compared to 2021.   NON-VASCULAR No other acute intra-abdominal or pelvic finding. Stable chronic findings as above.  Liver unremarkable.  Medications   Current Outpatient Medications  Medication Sig Dispense Refill   amlodipine-olmesartan (AZOR) 10-20 MG tablet TAKE 1 TABLET BY MOUTH ONCE DAILY **STOP  AMLODIPINE  10  MG** 30 tablet 2   aspirin EC 81 MG tablet Take 1 tablet (81 mg total) by mouth daily. 100 tablet 2   azelastine (ASTELIN) 0.1 % nasal spray Place 2 sprays into both nostrils 2 (two) times daily. Use in each nostril as directed 30 mL 12   cholecalciferol (VITAMIN D) 1000 units tablet Take 1,000 Units by mouth daily.     fluticasone (FLONASE) 50 MCG/ACT nasal spray Use 2 spray(s) in each nostril once daily 16 g 0   furosemide (LASIX) 40 MG tablet Take 1 tablet by mouth once daily 90 tablet 0   loratadine (CLARITIN) 10 MG tablet Take 1 tablet by mouth once daily 90 tablet 0   montelukast (SINGULAIR) 10 MG tablet TAKE 1 TABLET BY MOUTH AT BEDTIME 30 tablet 2   potassium chloride SA (KLOR-CON M) 20 MEQ tablet Take 1 tablet by mouth twice daily 180 tablet 0   Pyridoxine HCl (VITAMIN B-6 PO) Take 1 tablet by mouth daily.     spironolactone (ALDACTONE) 25  MG tablet Take 1 tablet by mouth once daily 90 tablet 0   No current facility-administered medications for this visit.    Allergies   Allergies as of 06/27/2022 - Review Complete 06/27/2022  Allergen Reaction Noted   Versed [midazolam] Shortness Of Breath and Other (See Comments) 01/18/2016   Sulfa antibiotics  08/02/2018    Past Medical History   Past Medical History:  Diagnosis Date   Anxiety    Arthritis    Essential hypertension, benign    GERD  (gastroesophageal reflux disease)    Herpes    History of kidney stones    Murmur    Obesity    Sinusitis    Urinary frequency     Past Surgical History   Past Surgical History:  Procedure Laterality Date   BIOPSY  08/29/2018   Procedure: BIOPSY;  Surgeon: Daneil Dolin, MD;  [facility];  Service: Endoscopy;;  gastric   COLONOSCOPY  11/13/2012   Procedure: COLONOSCOPY;  Surgeon: Rogene Houston, MD;  [facility]: [facility];  Service: Endoscopy;  Laterality: N/A;  930   embolization of uterine artery X 2     ESOPHAGOGASTRODUODENOSCOPY (EGD) WITH PROPOFOL N/A 08/29/2018   Procedure: ESOPHAGOGASTRODUODENOSCOPY (EGD) WITH PROPOFOL;  Surgeon: Daneil Dolin, MD;  [facility];  Service: Endoscopy;  Laterality: N/A;  3:00pm   INGUINAL LYMPH NODE BIOPSY Right 04/14/2016   Procedure: RIGHT INGUINAL LYMPH NODE BIOPSY;  Surgeon: Aviva Signs, MD;  Location: [facility];  Service: General;  Laterality: Right;   IR ANGIO INTRA EXTRACRAN SEL COM CAROTID INNOMINATE BILAT MOD SED  02/12/2017   IR ANGIO VERTEBRAL SEL VERTEBRAL BILAT MOD SED  02/12/2017   IR GENERIC HISTORICAL  04/27/2016   IR RADIOLOGIST EVAL & MGMT 04/27/2016 Jacqulynn Cadet, MD [facility]   IR GENERIC HISTORICAL  12/05/2016   IR RADIOLOGIST EVAL & MGMT 12/05/2016 Jacqulynn Cadet, MD [facility]   IR RADIOLOGIST EVAL & MGMT  01/22/2017   IR RADIOLOGIST EVAL & MGMT  08/06/2018   IR RADIOLOGIST EVAL & MGMT  08/19/2020   IR RADIOLOGIST EVAL & MGMT  09/07/2021   Left inguinal hernia repair and endometriosis  2007   No definitive records    Left parotid bx benign  2005   RADIOLOGY WITH ANESTHESIA N/A 01/18/2016   Procedure: RADIOLOGY WITH ANESTHESIA;  Surgeon: Luanne Bras, MD;  Location: [place];  Service: Radiology;  Laterality: N/A;   RADIOLOGY WITH ANESTHESIA N/A 02/12/2017   Procedure: Cherie Dark EMBOLIZATION;  Surgeon: Luanne Bras, MD;  Location: [place];  Service: Radiology;  Laterality: N/A;    renal artery aneurysm repair right Right 12/2353   UMBILICAL HERNIA REPAIR  infancy     Past Family History   Family History  Problem Relation Age of Onset   Hypertension Mother    Hyperlipidemia Mother    Thyroid disease Mother    Coronary artery disease Mother    Irregular heart beat Mother    Fibroids Sister    Hypertension Sister    Ulcerative colitis Son    Colon cancer Neg Hx     Past Social History   Social History   Socioeconomic History   Marital status: Married    Spouse name: Not on file   Number of children: Not on file   Years of education: Not on file   Highest education level: Associate degree: occupational, Hotel manager, or vocational program  Occupational History   Not on file  Tobacco  Use   Smoking status: Former    Packs/day: 0.25    Years: 5.00    Total pack years: 1.25    Types: Cigarettes    Quit date: 1997    Years since quitting: 26.7   Smokeless tobacco: Never  Vaping Use   Vaping Use: Never used  Substance and Sexual Activity   Alcohol use: Not Currently    Comment: rare   Drug use: No   Sexual activity: Yes    Birth control/protection: None  Other Topics Concern   Not on file  Social History Narrative   Not on file   Social Determinants of Health   Financial Resource Strain: Not on file  Food Insecurity: Not on file  Transportation Needs: No Transportation Needs (07/15/2019)   PRAPARE - Hydrologist (Medical): No    Lack of Transportation (Non-Medical): No  Physical Activity: Not on file  Stress: Not on file  Social Connections: Not on file  Intimate Partner Violence: Not on file    Review of Systems   General: Negative for anorexia, weight loss, fever, chills, fatigue, weakness. Eyes: Negative for vision changes.  ENT: Negative for hoarseness, difficulty swallowing , nasal congestion. CV: Negative for chest pain, angina, palpitations, dyspnea on exertion, peripheral edema.  Respiratory:  Negative for dyspnea at rest, dyspnea on exertion, cough, sputum, wheezing.  GI: See history of present illness. GU:  Negative for dysuria, hematuria, urinary incontinence, urinary frequency, nocturnal urination.  MS: Negative for joint pain, low back pain.  Derm: Negative for rash or itching.  Neuro: Negative for weakness, abnormal sensation, seizure, frequent headaches, memory loss,  confusion.  Psych: Negative for anxiety, depression, suicidal ideation, hallucinations.  Endo: Negative for unusual weight change.  Heme: Negative for bruising or bleeding. Allergy: Negative for rash or hives.  Physical Exam   BP 131/82 (BP Location: Right Arm, Patient Position: Sitting, Cuff Size: Normal)   Pulse 68   Temp 97.7 F (36.5 C) (Temporal)   Ht '5\' 9"'$  (1.753 m)   Wt 194 lb 9.6 oz (88.3 kg)   LMP  (LMP Unknown)   SpO2 100%   BMI 28.74 kg/m    General: Well-nourished, well-developed in no acute distress.  Head: Normocephalic, atraumatic.   Eyes: Conjunctiva pink, no icterus. Mouth: Oropharyngeal mucosa moist and pink , no lesions erythema or exudate. Neck: Supple without thyromegaly, masses, or lymphadenopathy.  Lungs: Clear to auscultation bilaterally.  Heart: Regular rate and rhythm, no murmurs rubs or gallops.  Abdomen: Bowel sounds are normal, nontender, nondistended, no hepatosplenomegaly or masses,  no abdominal bruits or hernia, no rebound or guarding.   Rectal: Not performed Extremities: No lower extremity edema. No clubbing or deformities.  Neuro: Alert and oriented x 4 , grossly normal neurologically.  Skin: Warm and dry, no rash or jaundice.   Psych: Alert and cooperative, normal mood and affect.  Labs   Late entry, patient sent copy of labs via mychart.   Labs from June 01, 2022: Glucose 47, BUN 22, creatinine 0.95, sodium 146, potassium 4.3, total bilirubin 0.4, alkaline phosphatase 173H, AST 40, ALT 40H, white blood cell count 3900, hemoglobin 11.8 normal, MCV 91,  platelets 250,000  Imaging Studies   No results found.  Assessment   Abnormal LFTs: Elevated alkaline phosphatase and ALT.  Liver imaging via CTA with contrast in November 2022 normal.  She will need further labs to try to determine etiology.  Differential diagnosis includes possibility of fatty liver, medication  effect, autoimmune process, viral, primary biliary cholangitis.   PLAN   Obtain labs including fasting LFTs, GGT, immunoglobulins, ANA, AMA, anti-smooth muscle antibody, hepatitis B and C labs, iron/TIBC/ferritin.   Laureen Ochs. Bobby Rumpf, [place], [facility]

## 2022-06-30 LAB — IGG, IGA, IGM
IgA/Immunoglobulin A, Serum: 342 mg/dL (ref 87–352)
IgG (Immunoglobin G), Serum: 1702 mg/dL — ABNORMAL HIGH (ref 586–1602)
IgM (Immunoglobulin M), Srm: 152 mg/dL (ref 26–217)

## 2022-06-30 LAB — HEPATIC FUNCTION PANEL
ALT: 39 IU/L — ABNORMAL HIGH (ref 0–32)
AST: 38 IU/L (ref 0–40)
Albumin: 4.8 g/dL (ref 3.8–4.9)
Alkaline Phosphatase: 160 IU/L — ABNORMAL HIGH (ref 44–121)
Bilirubin Total: 0.5 mg/dL (ref 0.0–1.2)
Bilirubin, Direct: 0.12 mg/dL (ref 0.00–0.40)
Total Protein: 7.9 g/dL (ref 6.0–8.5)

## 2022-06-30 LAB — HEPATITIS B SURFACE ANTIGEN: Hepatitis B Surface Ag: NEGATIVE

## 2022-06-30 LAB — IRON,TIBC AND FERRITIN PANEL
Ferritin: 198 ng/mL — ABNORMAL HIGH (ref 15–150)
Iron Saturation: 29 % (ref 15–55)
Iron: 89 ug/dL (ref 27–159)
Total Iron Binding Capacity: 310 ug/dL (ref 250–450)
UIBC: 221 ug/dL (ref 131–425)

## 2022-06-30 LAB — MITOCHONDRIAL/SMOOTH MUSCLE AB PNL
Mitochondrial Ab: 51.3 Units — ABNORMAL HIGH (ref 0.0–20.0)
Smooth Muscle Ab: 17 Units (ref 0–19)

## 2022-06-30 LAB — ANA: Anti Nuclear Antibody (ANA): POSITIVE — AB

## 2022-06-30 LAB — ANTI-MICROSOMAL ANTIBODY LIVER / KIDNEY: LKM1 Ab: 1.4 Units (ref 0.0–20.0)

## 2022-06-30 LAB — GAMMA GT: GGT: 86 IU/L — ABNORMAL HIGH (ref 0–60)

## 2022-06-30 LAB — HEPATITIS C ANTIBODY: Hep C Virus Ab: NONREACTIVE

## 2022-07-07 NOTE — Telephone Encounter (Signed)
Pt is wanting to know the results to her recent blood work.

## 2022-07-18 ENCOUNTER — Other Ambulatory Visit: Payer: Self-pay | Admitting: Family Medicine

## 2022-07-20 ENCOUNTER — Encounter: Payer: Self-pay | Admitting: Family Medicine

## 2022-07-22 ENCOUNTER — Other Ambulatory Visit: Payer: Self-pay | Admitting: Family Medicine

## 2022-07-22 MED ORDER — ACYCLOVIR 400 MG PO TABS: 400.0000 mg | ORAL_TABLET | Freq: Two times a day (BID) | ORAL | 3 refills | Status: DC

## 2022-07-26 ENCOUNTER — Ambulatory Visit (INDEPENDENT_AMBULATORY_CARE_PROVIDER_SITE_OTHER): Payer: Commercial Managed Care - PPO | Admitting: Allergy & Immunology

## 2022-07-26 ENCOUNTER — Encounter: Payer: Self-pay | Admitting: Allergy & Immunology

## 2022-07-26 VITALS — BP 130/88 | HR 79 | Temp 98.2°F | Resp 16 | Ht 69.0 in | Wt 196.4 lb

## 2022-07-26 DIAGNOSIS — T7800XD Anaphylactic reaction due to unspecified food, subsequent encounter: Secondary | ICD-10-CM

## 2022-07-26 DIAGNOSIS — R0602 Shortness of breath: Secondary | ICD-10-CM

## 2022-07-26 DIAGNOSIS — J310 Chronic rhinitis: Secondary | ICD-10-CM

## 2022-07-26 MED ORDER — ALBUTEROL SULFATE HFA 108 (90 BASE) MCG/ACT IN AERS: 2.0000 | INHALATION_SPRAY | Freq: Four times a day (QID) | RESPIRATORY_TRACT | 2 refills | Status: DC | PRN

## 2022-07-26 NOTE — Patient Instructions (Addendum)
1. Chronic rhinitis - We will plan to do skin testing NEXT MONDAY at [facility] in the [facility] office. - Your appointment will be with Webb Silversmith, one of the nurse practitioners.  - In the meantime, start Xhance one spray per nostril twice daily (put the other nasal sprays aside).  - Hopefully the testing will help with figuring out the next steps.  - Continue to hold of loratadine until after the next visit. - You CAN take the montelukast and this should not affect the testing.   2. Anaphylactic shock due to food - We will test for pecans at the next visit. - Continue to avoid until then.   3. Shortness of breath - Lung testing was in the 60% range and did not improve with the albuterol. - However, you did feel better which is good news. - We are going to give you a sample of a daily controller medication called Trelegy. - This contains three medications to help with keeping your asthma under better control. - Daily controller medication(s): Trelegy 100/62.5/25 one puff once daily - Prior to physical activity: albuterol 2 puffs 10-15 minutes before physical activity. - Rescue medications: albuterol 4 puffs every 4-6 hours as needed - Asthma control goals:  * Full participation in all desired activities (may need albuterol before activity) * Albuterol use two time or less a week on average (not counting use with activity) * Cough interfering with sleep two time or less a month * Oral steroids no more than once a year * No hospitalizations  4. Return in about 4 weeks (around 08/23/2022) for a regular office visit and NEXT MONDAY for skin testing appointment.    Please inform us of any Emergency Department visits, hospitalizations, or changes in symptoms. Call us before going to the ED for breathing or allergy symptoms since we might be able to fit you in for a sick visit. Feel free to contact us anytime with any questions, problems, or concerns.  It was a pleasure to meet you today!  Websites  that have reliable patient information: 1. American Academy of Asthma, Allergy, and Immunology: www.aaaai.org 2. Food Allergy Research and Education (FARE): foodallergy.org 3. Mothers of Asthmatics: http://www.asthmacommunitynetwork.org 4. American College of Allergy, Asthma, and Immunology: www.acaai.org   COVID-19 Vaccine Information can be found at: ShippingScam.co.uk For questions related to vaccine distribution or appointments, please email [email or web address] or call [phone].   We realize that you might be concerned about having an allergic reaction to the COVID19 vaccines. To help with that concern, WE ARE OFFERING THE COVID19 VACCINES IN OUR OFFICE! Ask the front desk for dates!     "Like" [place] on Facebook and Instagram for our latest updates!      A healthy democracy works best when [employer] participate! Make sure you are registered to vote! If you have moved or changed any of your contact information, you will need to get this updated before voting!  In some cases, you MAY be able to register to vote online: [email or web address]

## 2022-07-26 NOTE — Progress Notes (Signed)
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 Chronic rhinitis - We will plan to do skin testing NEXT MONDAY at [place] in the [facility] office. - Your appointment will be with Webb Silversmith, one of the nurse practitioners.  - In the meantime, start Xhance one spray per nostril twice daily (put the other nasal sprays aside).  - Hopefully the testing will help with figuring out the next steps.  - Continue to hold of loratadine until after the next visit. - You CAN take the montelukast and this should not affect the testing.   2. Anaphylactic shock due to food - We will test for pecans at the next visit. - Continue to avoid until then.   3. Shortness of breath - Lung testing was in the 60% range and did not improve with the albuterol. - However, you did feel better which is good news. - We are going to give you a sample of a daily controller medication called Trelegy. - This contains three medications to help with keeping your asthma under better control. - Daily controller medication(s): Trelegy 100/62.5/25 one puff once daily - Prior to physical activity: albuterol 2 puffs 10-15 minutes before physical activity. - Rescue medications: albuterol 4 puffs every 4-6 hours as needed - Asthma control goals:  * Full participation in all desired activities (may need albuterol before activity) * Albuterol use two time or less a week on average (not counting use with activity) * Cough interfering with sleep two time or less a month * Oral steroids no more than once a year * No hospitalizations  4. Return in about 4 weeks (around 08/23/2022) for a regular office visit and NEXT MONDAY for  skin testing appointment.     This note in its entirety was forwarded to the Provider who requested this consultation.  Subjective:   Allison Hart is a 61 y.o. female presenting today for evaluation of  Chief Complaint  Patient presents with   Allergy Testing    Mild pecan allergy.    Sinus Problem    Sinus issues- feels pressure feels like her jaw swells and locks up    Shortness of Breath    When she gets excited or screams she gets SOB    Allison Hart has a history of the following: Patient Active Problem List   Diagnosis Date Noted   Annual visit for general adult medical examination with abnormal findings 06/08/2022   Elevated alkaline phosphatase level 06/08/2022   Elevated LFTs 06/08/2022   Snoring 06/08/2022   Osteoarthritis of both knees 10/22/2021   Abnormal CT of the abdomen 08/13/2018   Hand pain, left 10/27/2017   Pulmonary hypertension, unspecified ([place]) 04/16/2017   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  Allison Hart was referred by Fayrene Helper, MD.     Allison Hart is a 61 y.o. female  presenting for an evaluation of sinus issues .   Asthma/Respiratory Symptom History: She reports some shortness of breath that is intermittent. She has never talked to her PCP about it. She does endorse some SOB.   Allergic Rhinitis Symptom History: She reports that that she as a constant congestion. She first noticed it all of her life. It has been going on and worsening for years. She is on two nasal sprays and two pills to help with her allergies.  She has been tested around ten years ago or more, from the  sounds of it at [facility]. She has seen an ENT provider. She cannot remember who she saw, but she had a cyst in one of her arteries. This is an unclear story and this was "years" ago. She has never had sinus surgery. She denies any exacerbating environments. She has a dog and does not fele that this is contributing to her symptoms at all. She is in fluticasone and azelastine. She has montelukast which does help with her nighttime symptoms. She has not been on cetirizine or fexofenadine.   Food Allergy Symptom History: She was "mildly positive" to pecan at skin testing at [facility]. She loves other tree nuts, but she avoids pecans. She does eat walnuts regularly. She does not have an EpiPen.  She does have some scratching in her throat with pecans, but she thinks she might have just noticed this more AFTER the testing.   Skin Symptom History: She denies any skin issues. She has no eczema or hives.  She does moisturize at times, but otherwise she does not use anything with regards to her skin care.  GERD Symptom History: She does have some reflux that is worsened with coffee. She loves coffee and drinks it all day long.   Otherwise, there is no history of other atopic diseases, including stinging insect allergies, urticaria, or contact dermatitis. There is no significant infectious history.  Vaccinations are up to date.    Past Medical History: Patient Active Problem List   Diagnosis Date Noted   Annual visit for general adult medical examination with abnormal findings 06/08/2022   Elevated alkaline phosphatase level 06/08/2022   Elevated LFTs 06/08/2022   Snoring 06/08/2022   Osteoarthritis of both knees 10/22/2021   Abnormal CT of the abdomen 08/13/2018   Hand pain, left 10/27/2017   Pulmonary hypertension, unspecified ([place]) 04/16/2017   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   Renal artery aneurysm ([facility]) 01/18/2016   Carpal tunnel syndrome 05/07/2013   Western blot  positive HSV2 05/01/2012   Allergic rhinitis 02/21/2012   CARDIOVASCULAR FUNCTION STUDY, ABNORMAL 07/26/2010   Hyperlipidemia LDL goal <100 08/19/2009   Obesity (BMI 30.0-34.9) 02/12/2008   Essential hypertension 02/12/2008    Medication List:  Allergies as of 07/26/2022       Reactions   Versed [midazolam] Shortness Of Breath, Other (See Comments)   Sweating, anxious and "I felt like I was going to pass out"   Sulfa Antibiotics         Medication List        Accurate as of July 26, 2022 11:59 PM. If you have any questions, ask your nurse or doctor.          acyclovir 400 MG tablet Commonly known as: ZOVIRAX Take 1 tablet (400 mg total) by mouth 2 (two) times daily.   albuterol 108 (90 Base) MCG/ACT inhaler Commonly known as: VENTOLIN HFA Inhale 2 puffs into the lungs every 6 (six) hours  as needed for wheezing or shortness of breath. Started by: Valentina Shaggy, MD   amlodipine-olmesartan 10-20 MG tablet Commonly known as: AZOR TAKE 1 TABLET BY MOUTH ONCE DAILY **STOP  AMLODIPINE  10  MG**   aspirin EC 81 MG tablet Take 1 tablet (81 mg total) by mouth daily.   azelastine 0.1 % nasal spray Commonly known as: ASTELIN Place 2 sprays into both nostrils 2 (two) times daily. Use in each nostril as directed   cholecalciferol 25 MCG (1000 UNIT) tablet Commonly known as: VITAMIN D3 Take 1,000 Units by mouth daily.   fluticasone 50 MCG/ACT nasal spray Commonly known as: FLONASE Use 2 spray(s) in each nostril once daily   furosemide 40 MG tablet Commonly known as: LASIX Take 1 tablet by mouth once daily   loratadine 10 MG tablet Commonly known as: CLARITIN Take 1 tablet by mouth once daily   montelukast 10 MG tablet Commonly known as: SINGULAIR TAKE 1 TABLET BY MOUTH AT BEDTIME   potassium chloride SA 20 MEQ tablet Commonly known as: KLOR-CON M Take 1 tablet by mouth twice daily   spironolactone 25 MG tablet Commonly known as: ALDACTONE Take 1  tablet by mouth once daily   VITAMIN B-6 PO Take 1 tablet by mouth daily.        Birth History: non-contributory  Developmental History: non-contributory  Past Surgical History: Past Surgical History:  Procedure Laterality Date   BIOPSY  08/29/2018   Procedure: BIOPSY;  Surgeon: Daneil Dolin, MD;  [facility];  Service: Endoscopy;;  gastric   COLONOSCOPY  11/13/2012   Procedure: COLONOSCOPY;  Surgeon: Rogene Houston, MD;  [facility];  Service: Endoscopy;  Laterality: N/A;  930   embolization of uterine artery X 2     ESOPHAGOGASTRODUODENOSCOPY (EGD) WITH PROPOFOL N/A 08/29/2018   Procedure: ESOPHAGOGASTRODUODENOSCOPY (EGD) WITH PROPOFOL;  Surgeon: Daneil Dolin, MD;  [facility];  Service: Endoscopy;  Laterality: N/A;  3:00pm   INGUINAL LYMPH NODE BIOPSY Right 04/14/2016   Procedure: RIGHT INGUINAL LYMPH NODE BIOPSY;  Surgeon: Aviva Signs, MD;  Location: [facility];  Service: General;  Laterality: Right;   IR ANGIO INTRA EXTRACRAN SEL COM CAROTID INNOMINATE BILAT MOD SED  02/12/2017   IR ANGIO VERTEBRAL SEL VERTEBRAL BILAT MOD SED  02/12/2017   IR GENERIC HISTORICAL  04/27/2016   IR RADIOLOGIST EVAL & MGMT 04/27/2016 Jacqulynn Cadet, MD [facility] INTERV RAD   IR GENERIC HISTORICAL  12/05/2016   IR RADIOLOGIST EVAL & MGMT 12/05/2016 Jacqulynn Cadet, MD [facility]   IR RADIOLOGIST EVAL & MGMT  01/22/2017   IR RADIOLOGIST EVAL & MGMT  08/06/2018   IR RADIOLOGIST EVAL & MGMT  08/19/2020   IR RADIOLOGIST EVAL & MGMT  09/07/2021   Left inguinal hernia repair and endometriosis  2007   No definitive records    Left parotid bx benign  2005   RADIOLOGY WITH ANESTHESIA N/A 01/18/2016   Procedure: RADIOLOGY WITH ANESTHESIA;  Surgeon: Luanne Bras, MD;  Location: [facility];  Service: Radiology;  Laterality: N/A;   RADIOLOGY WITH ANESTHESIA N/A 02/12/2017   Procedure: Cherie Dark EMBOLIZATION;  Surgeon: Luanne Bras, MD;  Location: [facility];  Service:  Radiology;  Laterality: N/A;   renal artery aneurysm repair right Right 06/3789   UMBILICAL HERNIA REPAIR  infancy      Family History: Family History  Problem Relation Age of Onset   Hypertension Mother    Hyperlipidemia Mother  Thyroid disease Mother    Coronary artery disease Mother    Irregular heart beat Mother    Asthma Sister    Fibroids Sister    Hypertension Sister    Asthma Maternal Grandfather    Ulcerative colitis Son    Asthma Niece    Colon cancer Neg Hx      Social History: Miryah lives at home with her husband.  She was in a house when he was 61 years old.  There is laminate throughout the home.  They have gas heating and central cooling.  There are dogs outside of the home.  There are no dust mite covers on the bedding.  There is no tobacco exposure.  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  She is on this for about 3 years.  She is not exposed to fumes, chemicals, or dust. Her husband is on disability due to multiple strokes and dementia. Her only son died in 01-18-2018 at age 39 due to pancreatic cancer. She was a smoker around 20 years ago.    Review of Systems  Constitutional: Negative.  Negative for chills, fever, malaise/fatigue and weight loss.  HENT:  Positive for congestion and sinus pain. Negative for ear discharge and ear pain.   Eyes:  Negative for pain, discharge and redness.  Respiratory:  Negative for cough, sputum production, shortness of breath and wheezing.   Cardiovascular: Negative.  Negative for chest pain and palpitations.  Gastrointestinal:  Negative for abdominal pain, constipation, diarrhea, heartburn, nausea and vomiting.  Skin: Negative.  Negative for itching and rash.  Neurological:  Negative for dizziness and headaches.  Endo/Heme/Allergies:  Negative for environmental allergies. Does not bruise/bleed easily.       Objective:    Blood pressure 130/88, pulse 79, temperature 98.2 F (36.8 C), resp. rate 16, height '5\' 9"'$  (1.753 m), weight 196 lb 6 oz (89.1 kg), SpO2 97 %. Body mass index is 29 kg/m.     Physical Exam Vitals reviewed.  Constitutional:      Appearance: She is well-developed.     Comments: Very pleasant. Cooperative with the exam.   HENT:     Head: Normocephalic and atraumatic.     Right Ear: Tympanic membrane, ear canal and external ear normal. No drainage, swelling or tenderness. Tympanic membrane is not injected, scarred, erythematous, retracted or bulging.     Left Ear: Tympanic membrane, ear canal and external ear normal. No drainage, swelling or tenderness. Tympanic membrane is not injected, scarred, erythematous, retracted or bulging.     Nose: No nasal deformity, septal deviation, mucosal edema or rhinorrhea.     Right Turbinates: Enlarged, swollen and pale.     Left Turbinates: Enlarged, swollen and pale.     Right Sinus: No maxillary sinus tenderness or frontal sinus tenderness.     Left Sinus: No maxillary sinus tenderness or frontal sinus tenderness.     Mouth/Throat:     Mouth: Mucous membranes are not pale and not dry.     Pharynx: Uvula midline.  Eyes:     General:        Right eye: No discharge.        Left eye: No discharge.     Conjunctiva/sclera: Conjunctivae normal.     Right eye: Right conjunctiva is not injected. No chemosis.    Left eye: Left conjunctiva is not injected. No chemosis.  Pupils: Pupils are equal, round, and reactive to light.  Cardiovascular:     Rate and Rhythm: Normal rate and regular rhythm.     Heart sounds: Normal heart sounds.  Pulmonary:     Effort: Pulmonary effort is normal. No tachypnea, accessory muscle usage or respiratory distress.     Breath sounds: Normal breath sounds. No wheezing, rhonchi or rales.  Chest:     Chest wall: No tenderness.  Abdominal:     Tenderness: There is no abdominal tenderness. There is no guarding or rebound.   Lymphadenopathy:     Head:     Right side of head: No submandibular, tonsillar or occipital adenopathy.     Left side of head: No submandibular, tonsillar or occipital adenopathy.     Cervical: No cervical adenopathy.  Skin:    Coloration: Skin is not pale.     Findings: No abrasion, erythema, petechiae or rash. Rash is not papular, urticarial or vesicular.  Neurological:     Mental Status: She is alert.  Psychiatric:        Behavior: Behavior is cooperative.      Diagnostic studies:   Spirometry: results abnormal (FEV1: 1.58/63%, FVC: 2.02/63%, FEV1/FVC: 78%).    Spirometry consistent with possible restrictive disease. Albuterol four puffs via MDI treatment given in clinic with no improvement.  Allergy Studies:  plan to do next week on Monday in the [facility] office           Salvatore Marvel, MD [facility] [place]

## 2022-07-29 NOTE — Progress Notes (Unsigned)
Cardiology Office Note   Date:  07/31/2022   ID:  Allison Hart, DOB 28-Mar-1961, MRN [id number]  PCP:  Fayrene Helper, MD    No chief complaint on file.  Pulm HTN  Wt Readings from Last 3 Encounters:  07/31/22 193 lb (87.5 kg)  07/31/22 195 lb 9.6 oz (88.7 kg)  07/26/22 196 lb 6 oz (89.1 kg)       History of Present Illness: Allison Hart is a 61 y.o. female   Who had a negative stress echo in 2013.     She was found to have enlarged lymph nodes.  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   THere was a question of brain aneurysm, but angio was negative.   Somehow, there was a question of pulmonary hypertension in 2018 that prompted her visit with me.   2018 echo: "Left ventricle: The cavity size was normal. There was focal basal    hypertrophy. Systolic function was normal. The estimated ejection    fraction was in the range of 55% to 60%. Wall motion was normal;    there were no regional wall motion abnormalities. Left    ventricular diastolic function parameters were normal.  - Aortic valve: Valve area (VTI): 3 cm^2. Valve area (Vmax): 2.62    cm^2. Valve area (Vmean): 3 cm^2.  - Left atrium: The atrium was mildly dilated.  - Atrial septum: No defect or patent foramen ovale was identified.  - Pulmonary arteries: Systolic pressure was moderately increased.    PA peak pressure: 46 mm Hg (S).  - Technically adequate study. 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  We changed diuretic to Lasix to see if this would help with increased PA pressrure seen on echo.   There was a delay in starting the Lasix, but she did diurese well.  Patient was asymptomatic as her dyspnea had improved, so we did not repeat an echocardiogram at that time.   Since the last visit, her shortness of breath is increased.  Starting Trelegy for the shortness of breath.  Sx improved.  Also has albuterol but has not used.  Denies : Chest pain. Dizziness. Leg edema.  Nitroglycerin use. Orthopnea. Palpitations. Paroxysmal nocturnal dyspnea. Shortness of breath. Syncope.    Does some walking, not much.  3K steps daily.   Past Medical History:  Diagnosis Date   Anxiety    Arthritis    Essential hypertension, benign    GERD (gastroesophageal reflux disease)    Herpes    History of kidney stones    Murmur    Obesity    Sinusitis    Urinary frequency     Past Surgical History:  Procedure Laterality Date   BIOPSY  08/29/2018   Procedure: BIOPSY;  Surgeon: Daneil Dolin, MD;  [facility];  Service: Endoscopy;;  gastric   COLONOSCOPY  11/13/2012   Procedure: COLONOSCOPY;  Surgeon: Rogene Houston, MD;  [facility];  Service: Endoscopy;  Laterality: N/A;  930   embolization of uterine artery X 2     ESOPHAGOGASTRODUODENOSCOPY (EGD) WITH PROPOFOL N/A 08/29/2018   Procedure: ESOPHAGOGASTRODUODENOSCOPY (EGD) WITH PROPOFOL;  Surgeon: Daneil Dolin, MD;  [facility];  Service: Endoscopy;  Laterality: N/A;  3:00pm   INGUINAL LYMPH NODE BIOPSY Right 04/14/2016   Procedure: RIGHT INGUINAL LYMPH NODE BIOPSY;  Surgeon: Aviva Signs, MD;  [facility];  Service: General;  Laterality:  Right;   IR ANGIO INTRA EXTRACRAN SEL COM CAROTID INNOMINATE BILAT MOD SED  02/12/2017   IR ANGIO VERTEBRAL SEL VERTEBRAL BILAT MOD SED  02/12/2017   IR GENERIC HISTORICAL  04/27/2016   IR RADIOLOGIST EVAL & MGMT 04/27/2016 Jacqulynn Cadet, MD [facility]   IR GENERIC HISTORICAL  12/05/2016   IR RADIOLOGIST EVAL & MGMT 12/05/2016 Jacqulynn Cadet, MD [facility]   IR RADIOLOGIST EVAL & MGMT  01/22/2017   IR RADIOLOGIST EVAL & MGMT  08/06/2018   IR RADIOLOGIST EVAL & MGMT  08/19/2020   IR RADIOLOGIST EVAL & MGMT  09/07/2021   Left inguinal hernia repair and endometriosis  2007   No definitive records    Left parotid bx benign  2005   RADIOLOGY WITH ANESTHESIA N/A 01/18/2016   Procedure: RADIOLOGY WITH ANESTHESIA;  Surgeon: Luanne Bras, MD;  Location: [facility];  Service: Radiology;  Laterality: N/A;   RADIOLOGY WITH ANESTHESIA N/A 02/12/2017   Procedure: Cherie Dark EMBOLIZATION;  Surgeon: Luanne Bras, MD;  Location: [facility];  Service: Radiology;  Laterality: N/A;   renal artery aneurysm repair right Right 03/6598   UMBILICAL HERNIA REPAIR  infancy      Current Outpatient Medications  Medication Sig Dispense Refill   acyclovir (ZOVIRAX) 400 MG tablet Take 1 tablet (400 mg total) by mouth 2 (two) times daily. 60 tablet 3   albuterol (VENTOLIN HFA) 108 (90 Base) MCG/ACT inhaler Inhale 2 puffs into the lungs every 6 (six) hours as needed for wheezing or shortness of breath. 8 g 2   amlodipine-olmesartan (AZOR) 10-20 MG tablet TAKE 1 TABLET BY MOUTH ONCE DAILY **STOP  AMLODIPINE  10  MG** 30 tablet 2   aspirin EC 81 MG tablet Take 1 tablet (81 mg total) by mouth daily. 100 tablet 2   cholecalciferol (VITAMIN D) 1000 units tablet Take 1,000 Units by mouth daily.     Fluticasone Propionate (XHANCE) 93 MCG/ACT EXHU Place 1 spray into the nose in the morning and at bedtime.     Fluticasone-Umeclidin-Vilant (TRELEGY ELLIPTA) 100-62.5-25 MCG/ACT AEPB Inhale 1 puff into the lungs daily.     furosemide (LASIX) 40 MG tablet Take 1 tablet by mouth once daily 90 tablet 0   loratadine (CLARITIN) 10 MG tablet Take 1 tablet by mouth once daily 90 tablet 0   montelukast (SINGULAIR) 10 MG tablet TAKE 1 TABLET BY MOUTH AT BEDTIME 30 tablet 2   potassium chloride SA (KLOR-CON M) 20 MEQ tablet Take 1 tablet by mouth twice daily 180 tablet 0   Pyridoxine HCl (VITAMIN B-6 PO) Take 1 tablet by mouth daily.     SODIUM FLUORIDE 5000 PPM 1.1 % PSTE Take by mouth.     spironolactone (ALDACTONE) 25 MG tablet Take 1 tablet by mouth once daily 90 tablet 0   No current facility-administered medications for this visit.    Allergies:   Versed [midazolam] and Sulfa antibiotics    Social History:  The patient  reports that she quit smoking about 26  years ago. Her smoking use included cigarettes. She has a 1.25 pack-year smoking history. She has never used smokeless tobacco. She reports that she does not currently use alcohol. She reports that she does not use drugs.   Family History:  The patient's family history includes Asthma in her maternal grandfather, niece, and sister; Coronary artery disease in her mother; Fibroids in her sister; Hyperlipidemia in her mother; Hypertension in her mother and sister; Irregular heart beat in her  mother; Thyroid disease in her mother; Ulcerative colitis in her son.    ROS:  Please see the history of present illness.   Otherwise, review of systems are positive for allergy issues.   All other systems are reviewed and negative.    PHYSICAL EXAM: VS:  BP (!) 126/96   Pulse (!) 56   Ht '5\' 9"'$  (1.753 m)   Wt 193 lb (87.5 kg)   LMP  (LMP Unknown)   SpO2 94%   BMI 28.50 kg/m  , BMI Body mass index is 28.5 kg/m. GEN: Well nourished, well developed, in no acute distress HEENT: normal Neck: no JVD, carotid bruits, or masses Cardiac: RRR; no murmurs, rubs, or gallops,no edema  Respiratory:  clear to auscultation bilaterally, normal work of breathing GI: soft, nontender, nondistended, + BS MS: no deformity or atrophy Skin: warm and dry, no rash Neuro:  Strength and sensation are intact Psych: euthymic mood, full affect   EKG:   The ekg ordered today demonstrates normal sinus rhythm, nonspecific ST T wave changes   Recent Labs: 12/19/2021: BUN 17; Creatinine, Ser 0.88; Potassium 4.1; Sodium 142 06/29/2022: ALT 39   Lipid Panel    Component Value Date/Time   CHOL 233 (H) 04/06/2020 0950   TRIG 79 04/06/2020 0950   HDL 53 04/06/2020 0950   CHOLHDL 4.4 04/06/2020 0950   VLDL 15 05/22/2016 1403   LDLCALC 162 (H) 04/06/2020 0950     Other studies Reviewed: Additional studies/ records that were reviewed today with results demonstrating: Labs reviewed LDL 162 in June 2021, HDL 53.   ASSESSMENT  AND PLAN:  Pulmonary hypertension: Check echo as it has been 5 years.  No acute symptoms of shortness of breath that would make me think pulmonary hypertension has worsened.  No symptoms of volume overload. Essential hypertension: The current medical regimen is effective;  continue present plan and medications.  Home readings are in the 130/80s.  Dyspnea on exertion: Improved with diuresis back in 2018.  Now with some allergy type symptoms being treated with inhalers. Prior aneurysm.  Renal stents placed in the past. Aortic atherosclerosis: Minimal scattered calcifications noted on 2019 CT.  No definitive coronary artery calcifications.  Continue healthy lifestyle.  Could consider statin therapy.  Would like to see more updated lipid profile from her routine blood work.   Current medicines are reviewed at length with the patient today.  The patient concerns regarding her medicines were addressed.  The following changes have been made:  No change  Labs/ tests ordered today include:  No orders of the defined types were placed in this encounter.   Recommend 150 minutes/week of aerobic exercise Low fat, low carb, high fiber diet recommended  Disposition:   FU in 1 year   Signed, Larae Grooms, MD  07/31/2022 11:22 AM    [facility] [place], [place], [place]  [address] Phone: [phone]; Fax: [phone]

## 2022-07-31 ENCOUNTER — Ambulatory Visit (INDEPENDENT_AMBULATORY_CARE_PROVIDER_SITE_OTHER): Payer: Commercial Managed Care - PPO | Admitting: Family Medicine

## 2022-07-31 ENCOUNTER — Encounter: Payer: Self-pay | Admitting: Interventional Cardiology

## 2022-07-31 ENCOUNTER — Encounter: Payer: Self-pay | Admitting: Family Medicine

## 2022-07-31 ENCOUNTER — Ambulatory Visit: Payer: Commercial Managed Care - PPO | Attending: Interventional Cardiology | Admitting: Interventional Cardiology

## 2022-07-31 VITALS — BP 136/84 | HR 82 | Temp 98.1°F | Resp 16 | Wt 195.6 lb

## 2022-07-31 VITALS — BP 124/80 | HR 56 | Ht 69.0 in | Wt 193.0 lb

## 2022-07-31 DIAGNOSIS — T7800XD Anaphylactic reaction due to unspecified food, subsequent encounter: Secondary | ICD-10-CM

## 2022-07-31 DIAGNOSIS — I272 Pulmonary hypertension, unspecified: Secondary | ICD-10-CM | POA: Diagnosis not present

## 2022-07-31 DIAGNOSIS — R0600 Dyspnea, unspecified: Secondary | ICD-10-CM | POA: Diagnosis not present

## 2022-07-31 DIAGNOSIS — I10 Essential (primary) hypertension: Secondary | ICD-10-CM | POA: Diagnosis not present

## 2022-07-31 DIAGNOSIS — J3089 Other allergic rhinitis: Secondary | ICD-10-CM | POA: Diagnosis not present

## 2022-07-31 DIAGNOSIS — J302 Other seasonal allergic rhinitis: Secondary | ICD-10-CM

## 2022-07-31 DIAGNOSIS — T7800XA Anaphylactic reaction due to unspecified food, initial encounter: Secondary | ICD-10-CM | POA: Insufficient documentation

## 2022-07-31 DIAGNOSIS — R0609 Other forms of dyspnea: Secondary | ICD-10-CM

## 2022-07-31 DIAGNOSIS — R0602 Shortness of breath: Secondary | ICD-10-CM | POA: Insufficient documentation

## 2022-07-31 NOTE — Progress Notes (Signed)
[place] [address] Dept: [phone]  FOLLOW UP NOTE  Patient ID: Allison Hart, female    DOB: [birth year]  Age: 61 y.o. MRN: [id number] Date of Office Visit: 07/31/2022  Assessment  Chief Complaint: Allergy Testing Geanie Berlin )  HPI Allison Hart is a 61 year old female who presents the clinic for follow-up visit.  She was last seen in this clinic on 07/26/2022 by Dr. 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  At today's visit, she reports her breathing has been well controlled with no shortness of breath, cough, or wheeze with activity or rest.  She continues montelukast 10 mg once a day and Trelegy 100-1 puff once a day.  She has not used albuterol since her last visit to this clinic.  Rhinitis is reported as poorly controlled with symptoms including clear rhinorrhea, nasal congestion, sneezing, and postnasal drainage.  She continues Xhance 1 spray in each nostril twice a day and is not currently using an antihistamine.  She continues to avoid pecan and is tolerating all other foods including walnut.  She reports that she has not had an allergic reaction to pecan, however, had tested positive to pecan at some point in the past and continues to avoid pecan at this time.  She reports that she did have some allergy testing at [facility] many years ago.  Her current medications are listed in the chart.   Drug Allergies:  Allergies  Allergen Reactions   Versed [Midazolam] Shortness Of Breath and Other (See Comments)    Sweating, anxious and "I felt like I was going to pass out"   Sulfa Antibiotics     Physical Exam: BP 136/84   Pulse 82   Temp 98.1 F (36.7 C) (Temporal)   Resp 16   Wt 195 lb 9.6 oz (88.7 kg)   LMP  (LMP Unknown)   SpO2 97%   BMI 28.89 kg/m    Physical Exam Vitals reviewed.  Constitutional:      Appearance: Normal appearance.  HENT:     Head: Normocephalic and atraumatic.     Right Ear:  Tympanic membrane normal.     Left Ear: Tympanic membrane normal.     Nose:     Comments: Bilateral nares edematous and pale with clear nasal drainage noted.  Pharynx slightly erythematous with no exudate.  Ears normal.  Eyes normal. Eyes:     Conjunctiva/sclera: Conjunctivae normal.  Cardiovascular:     Rate and Rhythm: Normal rate and regular rhythm.     Heart sounds: Normal heart sounds. No murmur heard. Pulmonary:     Effort: Pulmonary effort is normal.     Breath sounds: Normal breath sounds.     Comments: Lungs clear to auscultation Musculoskeletal:        General: Normal range of motion.     Cervical back: Normal range of motion and neck supple.  Skin:    General: Skin is warm and dry.  Neurological:     Mental Status: She is alert and oriented to person, place, and time.  Psychiatric:        Mood and Affect: Mood normal.        Behavior: Behavior normal.        Thought Content: Thought content normal.        Judgment: Judgment normal.     Diagnostics: Percutaneous environmental allergy skin testing was negative to the adult panel with adequate controls  Percutaneous skin testing to pecan was negative with adequate controls  Intradermal testing was positive to mold mix 3, mold mix 4, dust mite, and cockroach with adequate control  Assessment and Plan: 1. Seasonal and perennial allergic rhinitis   2. SOB (shortness of breath)   3. Anaphylactic shock due to food, subsequent encounter      Patient Instructions  Allergic rhinitis Your skin testing was positive to indoor and outdoor mold, dust mite, and cockroach Begin allergen avoidance measures as listed below Continue Claritin 10 mg once a day as needed for runny nose or itch Continue montelukast 10 mg once a day to help control allergy symptoms Continue azelastine 2 sprays in each nostril twice a day as needed for runny nose or nasal symptoms Continue Xhance 1 spray in each nostril twice a day as needed for  stuffy nose Consider saline nasal rinses as needed for nasal symptoms. Use this before any medicated nasal sprays for best result If your symptoms are not well controlled with the treatment plan as listed above, consider allergen immunotherapy  Shortness of breath Continue Trelegy 100-1 puff once a day to prevent cough or wheeze Continue montelukast 10 mg once a day to prevent cough or wheeze Continue albuterol 2 puffs once every 4 hours as needed for cough or wheeze You may use albuterol 2 puffs 5 to 15 minutes before activity to decrease cough or wheeze  Food allergy Your skin testing was negative to pecan In case of an allergic reaction, take Benadryl 50 mg every 4 hours, and if life-threatening symptoms occur, inject with EpiPen 0.3 mg. A lab has been placed to help [place] determine your allergy to pecan.  We will call you with the result becomes available  Call the clinic if this treatment plan is not working well for you  Follow up in 3 months or sooner if needed.   Return in about 3 months (around 10/31/2022), or if symptoms worsen or fail to improve.    Thank you for the opportunity to care for this patient.  Please do not hesitate to contact me with questions.  Gareth Morgan, FNP [facility]

## 2022-07-31 NOTE — Patient Instructions (Signed)
Medication Instructions:  Your physician recommends that you continue on your current medications as directed. Please refer to the Current Medication list given to you today.  *If you need a refill on your cardiac medications before your next appointment, please call your pharmacy*   Lab Work: none If you have labs (blood work) drawn today and your tests are completely normal, you will receive your results only by: MyChart Message (if you have MyChart) OR A paper copy in the mail If you have any lab test that is abnormal or we need to change your treatment, we will call you to review the results.   Testing/Procedures: Your physician has requested that you have an echocardiogram. Echocardiography is a painless test that uses sound waves to create images of your heart. It provides your doctor with information about the size and shape of your heart and how well your heart's chambers and valves are working. This procedure takes approximately one hour. There are no restrictions for this procedure. Please do NOT wear cologne, perfume, aftershave, or lotions (deodorant is allowed). Please arrive 15 minutes prior to your appointment time.    Follow-Up: At [facility], you and your health needs are our priority.  As part of our continuing mission to provide you with exceptional heart care, we have created designated Provider Care Teams.  These Care Teams include your primary Cardiologist (physician) and Advanced Practice Providers (APPs -  Physician Assistants and Nurse Practitioners) who all work together to provide you with the care you need, when you need it.  We recommend signing up for the patient portal called "MyChart".  Sign up information is provided on this After Visit Summary.  MyChart is used to connect with patients for Virtual Visits (Telemedicine).  Patients are able to view lab/test results, encounter notes, upcoming appointments, etc.  Non-urgent messages can be sent to your  provider as well.   To learn more about what you can do with MyChart, go to [email or web address].    Your next appointment:   12 month(s)  The format for your next appointment:   In Person  Provider:   Jayadeep Varanasi, MD     Other Instructions    Important Information About Sugar       

## 2022-07-31 NOTE — Patient Instructions (Addendum)
Allergic rhinitis Your skin testing was positive to indoor and outdoor mold, dust mite, and cockroach Begin allergen avoidance measures as listed below Continue Claritin 10 mg once a day as needed for runny nose or itch Continue montelukast 10 mg once a day to help control allergy symptoms Continue azelastine 2 sprays in each nostril twice a day as needed for runny nose or nasal symptoms Continue Xhance 1 spray in each nostril twice a day as needed for stuffy nose Consider saline nasal rinses as needed for nasal symptoms. Use this before any medicated nasal sprays for best result If your symptoms are not well controlled with the treatment plan as listed above, consider allergen immunotherapy  Shortness of breath Continue Trelegy 100-1 puff once a day to prevent cough or wheeze Continue montelukast 10 mg once a day to prevent cough or wheeze Continue albuterol 2 puffs once every 4 hours as needed for cough or wheeze You may use albuterol 2 puffs 5 to 15 minutes before activity to decrease cough or wheeze  Food allergy Your skin testing was negative to pecan In case of an allergic reaction, take Benadryl 50 mg every 4 hours, and if life-threatening symptoms occur, inject with EpiPen 0.3 mg. A lab has been placed to help [place] determine your allergy to pecan.  We will call you with the result becomes available  Call the clinic if this treatment plan is not working well for you  Follow up in 3 months or sooner if needed.  Control of Mold Allergen Mold and fungi can grow on a variety of surfaces provided certain temperature and moisture conditions exist.  Outdoor molds grow on plants, decaying vegetation and soil.  The major outdoor mold, Alternaria and Cladosporium, are found in very high numbers during hot and dry conditions.  Generally, a late Summer - Fall peak is seen for common outdoor fungal spores.  Rain will temporarily lower outdoor mold spore count, but counts rise rapidly when the rainy  period ends.  The most important indoor molds are Aspergillus and Penicillium.  Dark, humid and poorly ventilated basements are ideal sites for mold growth.  The next most common sites of mold growth are the bathroom and the kitchen.  Outdoor [employer] Use air conditioning and keep windows closed Avoid exposure to decaying vegetation. Avoid leaf raking. Avoid grain handling. Consider wearing a face mask if working in moldy areas.  Indoor Mold Control Maintain humidity below 50%. Clean washable surfaces with 5% bleach solution. Remove sources e.g. Contaminated carpets.  Control of Cockroach Allergen Cockroach allergen has been identified as an important cause of acute attacks of asthma, especially in urban settings.  There are fifty-five species of cockroach that exist in the [place], however only three, the [place], [place] species produce allergen that can affect patients with Asthma.  Allergens can be obtained from fecal particles, egg casings and secretions from cockroaches.    Remove food sources. Reduce access to water. Seal access and entry points. Spray runways with 0.5-1% Diazinon or Chlorpyrifos Blow boric acid power under stoves and refrigerator. Place bait stations (hydramethylnon) at feeding sites.    Control of Dust Mite Allergen Dust mites play a major role in allergic asthma and rhinitis. They occur in environments with high humidity wherever human skin is found. Dust mites absorb humidity from the atmosphere (ie, they do not drink) and feed on organic matter (including shed human and animal skin). Dust mites are a microscopic type of insect that you  cannot see with the naked eye. High levels of dust mites have been detected from mattresses, pillows, carpets, upholstered furniture, bed covers, clothes, soft toys and any woven material. The principal allergen of the dust mite is found in its feces. A gram of dust may contain 1,000 mites and 250,000 fecal  particles. Mite antigen is easily measured in the air during house cleaning activities. Dust mites do not bite and do not cause harm to humans, other than by triggering allergies/asthma.  Ways to decrease your exposure to dust mites in your home:  1. Encase mattresses, box springs and pillows with a mite-impermeable barrier or cover  2. Wash sheets, blankets and drapes weekly in hot water (130 F) with detergent and dry them in a dryer on the hot setting.  3. Have the room cleaned frequently with a vacuum cleaner and a damp dust-mop. For carpeting or rugs, vacuuming with a vacuum cleaner equipped with a high-efficiency particulate air (HEPA) filter. The dust mite allergic individual should not be in a room which is being cleaned and should wait 1 hour after cleaning before going into the room.  4. Do not sleep on upholstered furniture (eg, couches).  5. If possible removing carpeting, upholstered furniture and drapery from the home is ideal. Horizontal blinds should be eliminated in the rooms where the person spends the most time (bedroom, study, television room). Washable vinyl, roller-type shades are optimal.  6. Remove all non-washable stuffed toys from the bedroom. Wash stuffed toys weekly like sheets and blankets above.  7. Reduce indoor humidity to less than 50%. Inexpensive humidity monitors can be purchased at most hardware stores. Do not use a humidifier as can make the problem worse and are not recommended.

## 2022-08-05 LAB — ALLERGEN PECAN F201: Pecan Nut IgE: <0.1 kU/L

## 2022-08-07 NOTE — Progress Notes (Signed)
Can you please let this patient know that her skin testing and lab testing were both negstive to pecan. Please let her know that she can come into the clinic for a food challenge to pecan. Please send out the paperwork for a pecan challenge. Have her stop her antihistamine for 3 days before the challenge and let her know the appointment will be around 3 hours if all goes well. Thank you

## 2022-08-08 ENCOUNTER — Other Ambulatory Visit: Payer: Self-pay | Admitting: Family Medicine

## 2022-08-11 ENCOUNTER — Other Ambulatory Visit: Payer: Self-pay | Admitting: *Deleted

## 2022-08-11 MED ORDER — XHANCE 93 MCG/ACT NA EXHU: INHALANT_SUSPENSION | NASAL | 5 refills | Status: DC

## 2022-08-11 MED ORDER — TRELEGY ELLIPTA 100-62.5-25 MCG/ACT IN AEPB: 1.0000 | INHALATION_SPRAY | Freq: Every day | RESPIRATORY_TRACT | 5 refills | Status: DC

## 2022-08-14 ENCOUNTER — Other Ambulatory Visit: Payer: Self-pay

## 2022-08-14 MED ORDER — LORATADINE 10 MG PO TABS: 10.0000 mg | ORAL_TABLET | Freq: Every day | ORAL | 1 refills | Status: DC

## 2022-08-15 ENCOUNTER — Ambulatory Visit (HOSPITAL_COMMUNITY): Payer: Commercial Managed Care - PPO | Attending: Interventional Cardiology

## 2022-08-15 DIAGNOSIS — I272 Pulmonary hypertension, unspecified: Secondary | ICD-10-CM | POA: Diagnosis present

## 2022-08-15 DIAGNOSIS — R0609 Other forms of dyspnea: Secondary | ICD-10-CM | POA: Diagnosis present

## 2022-08-15 DIAGNOSIS — I10 Essential (primary) hypertension: Secondary | ICD-10-CM | POA: Diagnosis not present

## 2022-08-15 LAB — ECHOCARDIOGRAM COMPLETE
Area-P 1/2: 4.68 cm2
S' Lateral: 3.1 cm

## 2022-08-25 ENCOUNTER — Ambulatory Visit: Payer: Commercial Managed Care - PPO | Admitting: Family Medicine

## 2022-09-06 LAB — CMP14+EGFR
ALT: 53 IU/L — ABNORMAL HIGH (ref 0–32)
AST: 49 IU/L — ABNORMAL HIGH (ref 0–40)
Albumin/Globulin Ratio: 1.7 (ref 1.2–2.2)
Albumin: 4.7 g/dL (ref 3.8–4.9)
Alkaline Phosphatase: 149 IU/L — ABNORMAL HIGH (ref 44–121)
BUN/Creatinine Ratio: 19 (ref 12–28)
BUN: 19 mg/dL (ref 8–27)
Bilirubin Total: 0.4 mg/dL (ref 0.0–1.2)
CO2: 25 mmol/L (ref 20–29)
Calcium: 9.9 mg/dL (ref 8.7–10.3)
Chloride: 103 mmol/L (ref 96–106)
Creatinine, Ser: 1.01 mg/dL — ABNORMAL HIGH (ref 0.57–1.00)
Globulin, Total: 2.8 g/dL (ref 1.5–4.5)
Glucose: 82 mg/dL (ref 70–99)
Potassium: 5 mmol/L (ref 3.5–5.2)
Sodium: 144 mmol/L (ref 134–144)
Total Protein: 7.5 g/dL (ref 6.0–8.5)
eGFR: 64 mL/min/{1.73_m2} (ref >59)

## 2022-09-06 LAB — LIPID PANEL
Chol/HDL Ratio: 3.7 ratio (ref 0.0–4.4)
Cholesterol, Total: 235 mg/dL — ABNORMAL HIGH (ref 100–199)
HDL: 63 mg/dL (ref >39)
LDL Chol Calc (NIH): 162 mg/dL — ABNORMAL HIGH (ref 0–99)
Triglycerides: 60 mg/dL (ref 0–149)
VLDL Cholesterol Cal: 10 mg/dL (ref 5–40)

## 2022-09-06 LAB — CBC
Hematocrit: 37.7 % (ref 34.0–46.6)
Hemoglobin: 12.1 g/dL (ref 11.1–15.9)
MCH: 28.7 pg (ref 26.6–33.0)
MCHC: 32.1 g/dL (ref 31.5–35.7)
MCV: 89 fL (ref 79–97)
Platelets: 231 10*3/uL (ref 150–450)
RBC: 4.22 x10E6/uL (ref 3.77–5.28)
RDW: 12.5 % (ref 11.7–15.4)
WBC: 4.4 10*3/uL (ref 3.4–10.8)

## 2022-09-06 LAB — TSH: TSH: 1.22 u[IU]/mL (ref 0.450–4.500)

## 2022-09-06 LAB — VITAMIN D 25 HYDROXY (VIT D DEFICIENCY, FRACTURES): Vit D, 25-Hydroxy: 47.1 ng/mL (ref 30.0–100.0)

## 2022-09-08 ENCOUNTER — Ambulatory Visit: Payer: Commercial Managed Care - PPO | Admitting: Family Medicine

## 2022-09-12 ENCOUNTER — Other Ambulatory Visit (HOSPITAL_COMMUNITY): Payer: Self-pay | Admitting: Family Medicine

## 2022-09-12 ENCOUNTER — Ambulatory Visit (INDEPENDENT_AMBULATORY_CARE_PROVIDER_SITE_OTHER): Payer: Commercial Managed Care - PPO | Admitting: Family Medicine

## 2022-09-12 ENCOUNTER — Encounter: Payer: Self-pay | Admitting: Family Medicine

## 2022-09-12 VITALS — BP 132/80 | HR 75 | Ht 69.0 in | Wt 194.1 lb

## 2022-09-12 DIAGNOSIS — E663 Overweight: Secondary | ICD-10-CM

## 2022-09-12 DIAGNOSIS — I272 Pulmonary hypertension, unspecified: Secondary | ICD-10-CM | POA: Diagnosis not present

## 2022-09-12 DIAGNOSIS — I10 Essential (primary) hypertension: Secondary | ICD-10-CM | POA: Diagnosis not present

## 2022-09-12 DIAGNOSIS — Z1231 Encounter for screening mammogram for malignant neoplasm of breast: Secondary | ICD-10-CM

## 2022-09-12 DIAGNOSIS — E785 Hyperlipidemia, unspecified: Secondary | ICD-10-CM

## 2022-09-12 NOTE — Patient Instructions (Addendum)
  Please schedule mammogram at checkout due in February Follow-up in 5.5 months, call if you need me sooner.  Please schedule mammogram at checkout due in February.   Colonoscopy is due next year and I have sent a message to [facility]I re follow up  Please talk with Pulmonary at checkout re arranging sleep study which you need  Please reduce fried and fatty foods and ioils at checkout , cholesterol is high     Fasting lipid, cmp and eGFr 3 to 5 days before next visit  Need covid vaccine It is important that you exercise regularly at least 30 minutes 5 times a week. If you develop chest pain, have severe difficulty breathing, or feel very tired, stop exercising immediately and seek medical attention  Thanks for choosing [facility], we consider it a privelige to serve you.

## 2022-09-13 ENCOUNTER — Encounter: Payer: Self-pay | Admitting: Family Medicine

## 2022-09-13 ENCOUNTER — Other Ambulatory Visit: Payer: Self-pay | Admitting: Gastroenterology

## 2022-09-13 MED ORDER — URSODIOL 300 MG PO CAPS: 600.0000 mg | ORAL_CAPSULE | Freq: Two times a day (BID) | ORAL | 11 refills | Status: DC

## 2022-09-13 NOTE — Assessment & Plan Note (Signed)
Hyperlipidemia:Low fat diet discussed and encouraged.   Lipid Panel  Lab Results  Component Value Date   CHOL 235 (H) 09/05/2022   HDL 63 09/05/2022   LDLCALC 162 (H) 09/05/2022   TRIG 60 09/05/2022   CHOLHDL 3.7 09/05/2022     Managed with diet, uncotrolled  Updated lab needed at/ before next visit.

## 2022-09-13 NOTE — Assessment & Plan Note (Signed)
  Patient re-educated about  the importance of commitment to a  minimum of 150 minutes of exercise per week as able.  The importance of healthy food choices with portion control discussed, as well as eating regularly and within a 12 hour window most days. The need to choose "clean , green" food 50 to 75% of the time is discussed, as well as to make water the primary drink and set a goal of 64 ounces water daily.       09/12/2022    1:58 PM 07/31/2022   10:58 AM 07/31/2022    8:59 AM  Weight /BMI  Weight 194 lb 1.9 oz 193 lb 195 lb 9.6 oz  Height '5\' 9"'$  (1.753 m) '5\' 9"'$  (1.753 m)   BMI 28.67 kg/m2 28.5 kg/m2 28.89 kg/m2

## 2022-09-13 NOTE — Progress Notes (Signed)
Allison Hart     MRN: [id number]      DOB: 1961-09-18   HPI Allison Hart is here for follow up and re-evaluation of chronic medical conditions, medication management and review of any available recent lab and radiology data.  Preventive health is updated, specifically  Cancer screening and Immunization.   Questions or concerns regarding consultations or procedures which the PT has had in the interim are  addressed.Still waiting on follow through from GI re consultation for abn LFT, autoimmune pathology a consideration Needs to get sleep study set up and understands her need to have this treated as she has pulmonary hTN Allergist has determined she has RAD and med adjustment made with follow up Somewhat overwhelmed and also frustrated, new dx , more tests etc, motivated to care for herelf and will folow through   ROS Denies recent fever or chills. Denies sinus pressure, nasal congestion, ear pain or sore throat. Denies chest congestion, productive cough or wheezing. Denies chest pains, palpitations and leg swelling Denies abdominal pain, nausea, vomiting,diarrhea or constipation.   Denies dysuria, frequency, hesitancy or incontinence. Denies joint pain, swelling and limitation in mobility. Denies headaches, seizures, numbness, or tingling. Denies depression, anxiety or insomnia. Denies skin break down or rash.   PE  BP 132/80 (BP Location: Right Arm, Patient Position: Sitting, Cuff Size: Large)   Pulse 75   Ht '5\' 9"'$  (1.753 m)   Wt 194 lb 1.9 oz (88.1 kg)   LMP  (LMP Unknown)   SpO2 92%   BMI 28.67 kg/m   Patient alert and oriented and in no cardiopulmonary distress.  HEENT: No facial asymmetry, EOMI,     Neck supple .  Chest: Clear to auscultation bilaterally.  CVS: S1, S2 no murmurs, no S3.Regular rate.  ABD: Soft non tender.   Ext: No edema  MS: Adequate ROM spine, shoulders, hips and knees.  Skin: Intact, no ulcerations or rash noted.  Psych: Good eye  contact, normal affect. Memory intact not anxious or depressed appearing.  CNS: CN 2-12 intact, power,  normal throughout.no focal deficits noted.   Assessment & Plan  Essential hypertension Controlled, no change in medication DASH diet and commitment to daily physical activity for a minimum of 30 minutes discussed and encouraged, as a part of hypertension management. The importance of attaining a healthy weight is also discussed.     09/12/2022    1:58 PM 07/31/2022   11:25 AM 07/31/2022   10:58 AM 07/31/2022    8:59 AM 07/26/2022    8:56 AM 06/27/2022    8:44 AM 06/14/2022   10:39 AM  BP/Weight  Systolic BP 710 626 948 546 270 350 093  Diastolic BP 80 80 96 84 88 82 84  Wt. (Lbs) 194.12  193 195.6 196.38 194.6 195.6  BMI 28.67 kg/m2  28.5 kg/m2 28.89 kg/m2 29 kg/m2 28.74 kg/m2 28.89 kg/m2       Pulmonary hypertension, unspecified ([place]) Needs to get sleep study so sleep apnea if dx is treated  Hyperlipidemia LDL goal <100 Hyperlipidemia:Low fat diet discussed and encouraged.   Lipid Panel  Lab Results  Component Value Date   CHOL 235 (H) 09/05/2022   HDL 63 09/05/2022   LDLCALC 162 (H) 09/05/2022   TRIG 60 09/05/2022   CHOLHDL 3.7 09/05/2022     Managed with diet, uncotrolled  Updated lab needed at/ before next visit.   Overweight (BMI 25.0-29.9)  Patient re-educated about  the importance of commitment to a  minimum of 150 minutes of exercise per week as able.  The importance of healthy food choices with portion control discussed, as well as eating regularly and within a 12 hour window most days. The need to choose "clean , green" food 50 to 75% of the time is discussed, as well as to make water the primary drink and set a goal of 64 ounces water daily.       09/12/2022    1:58 PM 07/31/2022   10:58 AM 07/31/2022    8:59 AM  Weight /BMI  Weight 194 lb 1.9 oz 193 lb 195 lb 9.6 oz  Height '5\' 9"'$  (1.753 m) '5\' 9"'$  (1.753 m)   BMI 28.67 kg/m2 28.5 kg/m2 28.89  kg/m2

## 2022-09-13 NOTE — Assessment & Plan Note (Signed)
Controlled, no change in medication DASH diet and commitment to daily physical activity for a minimum of 30 minutes discussed and encouraged, as a part of hypertension management. The importance of attaining a healthy weight is also discussed.     09/12/2022    1:58 PM 07/31/2022   11:25 AM 07/31/2022   10:58 AM 07/31/2022    8:59 AM 07/26/2022    8:56 AM 06/27/2022    8:44 AM 06/14/2022   10:39 AM  BP/Weight  Systolic BP 320 037 944 461 901 222 411  Diastolic BP 80 80 96 84 88 82 84  Wt. (Lbs) 194.12  193 195.6 196.38 194.6 195.6  BMI 28.67 kg/m2  28.5 kg/m2 28.89 kg/m2 29 kg/m2 28.74 kg/m2 28.89 kg/m2

## 2022-09-13 NOTE — Assessment & Plan Note (Signed)
Needs to get sleep study so sleep apnea if dx is treated

## 2022-09-19 NOTE — Telephone Encounter (Signed)
Lmom for pt to return my call.  

## 2022-09-20 DIAGNOSIS — G4733 Obstructive sleep apnea (adult) (pediatric): Secondary | ICD-10-CM

## 2022-09-20 DIAGNOSIS — R0683 Snoring: Secondary | ICD-10-CM

## 2022-09-20 NOTE — Telephone Encounter (Signed)
Pt states that she did read the message and that she thought you all would have discussed the medication before it was sent in. Pt states that she will take the medication and that she is scheduled for a follow up in January.

## 2022-09-21 NOTE — Telephone Encounter (Signed)
Communication noted.  

## 2022-09-21 NOTE — Telephone Encounter (Signed)
Called and spoke to patient. All questions answered.

## 2022-10-10 ENCOUNTER — Telehealth: Payer: Self-pay | Admitting: Pulmonary Disease

## 2022-10-10 DIAGNOSIS — G4733 Obstructive sleep apnea (adult) (pediatric): Secondary | ICD-10-CM

## 2022-10-10 NOTE — Telephone Encounter (Signed)
Spoke with patient regarding HST results. They verbalized understanding. No further questions.  Nothing further needed at this time. Scheduled her for an OV.

## 2022-10-10 NOTE — Telephone Encounter (Signed)
HST 09/30/22 >> AHI 9.7, SpO2 low 63%.   Please inform her that her sleep study shows mild obstructive sleep apnea.  Please arrange for ROV with me or NP to discuss treatment options.

## 2022-10-14 ENCOUNTER — Other Ambulatory Visit: Payer: Self-pay | Admitting: Family Medicine

## 2022-10-17 ENCOUNTER — Encounter: Payer: Self-pay | Admitting: Family Medicine

## 2022-10-18 ENCOUNTER — Encounter: Payer: Self-pay | Admitting: Internal Medicine

## 2022-10-18 ENCOUNTER — Telehealth (INDEPENDENT_AMBULATORY_CARE_PROVIDER_SITE_OTHER): Payer: Commercial Managed Care - PPO | Admitting: Internal Medicine

## 2022-10-18 DIAGNOSIS — U071 COVID-19: Secondary | ICD-10-CM

## 2022-10-18 MED ORDER — NIRMATRELVIR/RITONAVIR (PAXLOVID)TABLET: 3.0000 | ORAL_TABLET | Freq: Two times a day (BID) | ORAL | 0 refills | Status: AC

## 2022-10-18 NOTE — Telephone Encounter (Signed)
scheduled

## 2022-10-18 NOTE — Progress Notes (Signed)
Virtual Visit via Video Note   Because of [name]'s co-morbid illnesses, she is at least at moderate risk for complications without adequate follow up.  This format is felt to be most appropriate for this patient at this time.  All issues noted in this document were discussed and addressed.  A limited physical exam was performed with this format.    Evaluation Performed:  Follow-up visit  Date:  10/18/2022   ID:  Allison Hart, DOB 1961-06-03, MRN [id number]  Patient Location: Home Provider Location: Office/Clinic  Participants: Patient Location of Patient: Home Location of Provider: Telehealth Consent was obtain for visit to be over via telehealth. I verified that I am speaking with the correct person using two identifiers.  PCP:  Fayrene Helper, MD   Chief Complaint: Cough, sore throat and sinus pressure  History of Present Illness:    Allison Hart is a 62 y.o. female who has a video visit for complaint of cough, sore throat and sinus pressure for the last 3 days.  She denies chills, but had low-grade fever.  She has mild dyspnea at baseline and uses Trelegy inhaler for her asthma.  Her home COVID test was positive x 2.  The patient does have symptoms concerning for COVID-19 infection (fever, chills, cough, or new shortness of breath).   Past Medical, Surgical, Social History, Allergies, and Medications have been Reviewed.  Past Medical History:  Diagnosis Date   Anxiety    Arthritis    Essential hypertension, benign    GERD (gastroesophageal reflux disease)    Herpes    History of kidney stones    Murmur    Obesity    Sinusitis    Urinary frequency    Past Surgical History:  Procedure Laterality Date   BIOPSY  08/29/2018   Procedure: BIOPSY;  Surgeon: Daneil Dolin, MD;  [facility];  Service: Endoscopy;;  gastric   COLONOSCOPY  11/13/2012   Procedure: COLONOSCOPY;  Surgeon: Rogene Houston, MD;  [facility];   Service: Endoscopy;  Laterality: N/A;  930   embolization of uterine artery X 2     ESOPHAGOGASTRODUODENOSCOPY (EGD) WITH PROPOFOL N/A 08/29/2018   Procedure: ESOPHAGOGASTRODUODENOSCOPY (EGD) WITH PROPOFOL;  Surgeon: Daneil Dolin, MD;  [facility];  Service: Endoscopy;  Laterality: N/A;  3:00pm   INGUINAL LYMPH NODE BIOPSY Right 04/14/2016   Procedure: RIGHT INGUINAL LYMPH NODE BIOPSY;  Surgeon: Aviva Signs, MD;  [facility];  Service: General;  Laterality: Right;   IR ANGIO INTRA EXTRACRAN SEL COM CAROTID INNOMINATE BILAT MOD SED  02/12/2017   IR ANGIO VERTEBRAL SEL VERTEBRAL BILAT MOD SED  02/12/2017   IR GENERIC HISTORICAL  04/27/2016   IR RADIOLOGIST EVAL & MGMT 04/27/2016 Jacqulynn Cadet, MD [facility]   IR GENERIC HISTORICAL  12/05/2016   IR RADIOLOGIST EVAL & MGMT 12/05/2016 Jacqulynn Cadet, MD [facility]   IR RADIOLOGIST EVAL & MGMT  01/22/2017   IR RADIOLOGIST EVAL & MGMT  08/06/2018   IR RADIOLOGIST EVAL & MGMT  08/19/2020   IR RADIOLOGIST EVAL & MGMT  09/07/2021   Left inguinal hernia repair and endometriosis  2007   No definitive records    Left parotid bx benign  2005   RADIOLOGY WITH ANESTHESIA N/A 01/18/2016   Procedure: RADIOLOGY WITH ANESTHESIA;  Surgeon: Luanne Bras, MD;  Location: [place];  Service: Radiology;  Laterality: N/A;   RADIOLOGY WITH ANESTHESIA  N/A 02/12/2017   Procedure: Dionne Ano;  Surgeon: Luanne Bras, MD;  Location: [place];  Service: Radiology;  Laterality: N/A;   renal artery aneurysm repair right Right 10/173   UMBILICAL HERNIA REPAIR  infancy      Current Meds  Medication Sig   Azelastine HCl 137 MCG/SPRAY SOLN Place 2 sprays into both nostrils 2 (two) times daily.     Allergies:   Versed [midazolam] and Sulfa antibiotics   ROS:   Please see the history of present illness.     All other systems reviewed and are negative.   Labs/Other Tests and Data Reviewed:    Recent Labs: 09/05/2022: ALT 53;  BUN 19; Creatinine, Ser 1.01; Hemoglobin 12.1; Platelets 231; Potassium 5.0; Sodium 144; TSH 1.220   Recent Lipid Panel Lab Results  Component Value Date/Time   CHOL 235 (H) 09/05/2022 08:35 AM   TRIG 60 09/05/2022 08:35 AM   HDL 63 09/05/2022 08:35 AM   CHOLHDL 3.7 09/05/2022 08:35 AM   CHOLHDL 4.4 04/06/2020 09:50 AM   LDLCALC 162 (H) 09/05/2022 08:35 AM   LDLCALC 162 (H) 04/06/2020 09:50 AM    Wt Readings from Last 3 Encounters:  09/12/22 194 lb 1.9 oz (88.1 kg)  07/31/22 193 lb (87.5 kg)  07/31/22 195 lb 9.6 oz (88.7 kg)     ASSESSMENT & PLAN:    COVID-19 infection Started Paxlovid as she is at high-risk due to her underlying reactive airway disease/asthma Continue Mucinex or Robitussin as needed for cough Continue Singulair for allergies Nasal saline spray as needed for nasal congestion Continue Trelegy and as needed albuterol for asthma   Time:   Today, I have spent 11 minutes reviewing the chart, including problem list, medications, and with the patient with telehealth technology discussing the above problems.   Medication Adjustments/Labs and Tests Ordered: Current medicines are reviewed at length with the patient today.  Concerns regarding medicines are outlined above.   Tests Ordered: No orders of the defined types were placed in this encounter.   Medication Changes: No orders of the defined types were placed in this encounter.    Note: This dictation was prepared with Dragon dictation along with smaller phrase technology. Similar sounding words can be transcribed inadequately or may not be corrected upon review. Any transcriptional errors that result from this process are unintentional.      Disposition:  Follow up  Signed, Lindell Spar, MD  10/18/2022 2:55 PM     [name]

## 2022-10-20 ENCOUNTER — Ambulatory Visit: Payer: Commercial Managed Care - PPO | Admitting: Family Medicine

## 2022-10-23 ENCOUNTER — Encounter (INDEPENDENT_AMBULATORY_CARE_PROVIDER_SITE_OTHER): Payer: Self-pay | Admitting: *Deleted

## 2022-10-30 ENCOUNTER — Other Ambulatory Visit: Payer: Self-pay

## 2022-10-30 ENCOUNTER — Other Ambulatory Visit: Payer: Self-pay | Admitting: Family Medicine

## 2022-10-30 DIAGNOSIS — R7989 Other specified abnormal findings of blood chemistry: Secondary | ICD-10-CM

## 2022-11-03 LAB — HEPATIC FUNCTION PANEL
ALT: 41 IU/L — ABNORMAL HIGH (ref 0–32)
AST: 39 IU/L (ref 0–40)
Albumin: 4.5 g/dL (ref 3.8–4.9)
Alkaline Phosphatase: 127 IU/L — ABNORMAL HIGH (ref 44–121)
Bilirubin Total: 0.4 mg/dL (ref 0.0–1.2)
Bilirubin, Direct: 0.14 mg/dL (ref 0.00–0.40)
Total Protein: 7.4 g/dL (ref 6.0–8.5)

## 2022-11-07 ENCOUNTER — Encounter: Payer: Self-pay | Admitting: Gastroenterology

## 2022-11-07 ENCOUNTER — Ambulatory Visit (INDEPENDENT_AMBULATORY_CARE_PROVIDER_SITE_OTHER): Payer: Commercial Managed Care - PPO | Admitting: Gastroenterology

## 2022-11-07 VITALS — BP 126/85 | HR 86 | Temp 98.5°F | Ht 69.0 in | Wt 193.6 lb

## 2022-11-07 DIAGNOSIS — Z1211 Encounter for screening for malignant neoplasm of colon: Secondary | ICD-10-CM | POA: Insufficient documentation

## 2022-11-07 DIAGNOSIS — K743 Primary biliary cirrhosis: Secondary | ICD-10-CM | POA: Diagnosis not present

## 2022-11-07 NOTE — Patient Instructions (Signed)
Colonoscopy to be scheduled for March. Recommend multivitamin containing vitamins A, D, E, and K.  We will check your labs in 3 months.  Continue ursodiol '600mg'$  twice daily with food.

## 2022-11-07 NOTE — Progress Notes (Signed)
GI Office Note    Referring Provider: Fayrene Helper, MD Primary Care Physician:  Fayrene Helper, MD  Primary Gastroenterologist: Garfield Cornea, MD   Chief Complaint   Chief Complaint  Patient presents with   Follow-up    Doing well    History of Present Illness   Allison Hart is a 62 y.o. female presenting today to discuss recently diagnosed primary biliary cholangitis.   Back in September she had a positive AMA, ANA, elevated IgG.  Alk phos of 160, AST 39.  Historically she has had chronically elevated Alk phos and minimally elevated transaminases. Hepatitis B and C markers were negative.  Ferritin was elevated at 198 with normal iron saturations of 29%.  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 Plans to monitor LFTs, if AST/ALT, IgG rise further with normalization of alk phos, then would consider liver biopsy to rule out overlapping autoimmune hepatitis.    Clinically she feels well. Intentional weight loss of 20 pounds in the last one year. No abdominal pain, itching, n/v. Appetite normal. No heartburn. BMs regular. No melena, brbpr.    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 Pulsatile Component. No Biopsies taken. Abnormal gastric mucosa as described above?status post biopsy. In reviewing the record, back in 2017, patient was seen by oncology for intrathoracic adenopathy. She had significant adenopathy. Follow-up chest CT was recommended but apparently this was not done.  Hyperplastic gastric polyp.  Gastritis but no H. pylori.  Chest CT from December 2019 showed left pulmonary vein impinging on the esophagus likely the source of the findings.  Stable borderline enlarged supraclavicular and axillary lymph nodes bilaterally, requested PCP to follow.  Colonoscopy  11/2012: diverticulosis, small hemorrhoids. Next colonoscopy 10 years.   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 Right renal artery and stented segment remain patent. Excluded aneurysm remains thrombosed with similar size measurements compared to 2021.   NON-VASCULAR No other acute intra-abdominal or pelvic finding. Stable chronic findings as above.  Liver unremarkable.  Medications   Current Outpatient Medications  Medication Sig Dispense Refill   acyclovir (ZOVIRAX) 400 MG tablet Take 1 tablet (400 mg total) by mouth 2 (two) times daily. 60 tablet 3   albuterol (VENTOLIN HFA) 108 (90 Base) MCG/ACT inhaler Inhale 2 puffs into the lungs every 6 (six) hours as needed for wheezing or shortness of breath. 8 g 2   amlodipine-olmesartan (AZOR) 10-20 MG tablet Take 1 tablet by mouth once daily 90 tablet 0   aspirin EC 81 MG tablet Take 1 tablet (81 mg total) by mouth daily. 100 tablet 2   Azelastine HCl 137 MCG/SPRAY SOLN Place 2 sprays into both nostrils 2 (two) times daily.     cholecalciferol (VITAMIN D) 1000 units tablet Take 1,000 Units by mouth daily.     Fluticasone-Umeclidin-Vilant (TRELEGY ELLIPTA) 100-62.5-25 MCG/ACT AEPB Inhale 1 puff into the lungs daily.     furosemide (LASIX) 40 MG tablet Take 1 tablet by mouth once daily 90 tablet 0   montelukast (SINGULAIR) 10 MG tablet TAKE 1 TABLET BY MOUTH AT BEDTIME 90 tablet 0   potassium chloride SA (KLOR-CON M20) 20 MEQ tablet TAKE 1  BY MOUTH TWICE DAILY 180 tablet 0   Pyridoxine HCl (VITAMIN B-6  PO) Take 1 tablet by mouth daily.     SODIUM FLUORIDE 5000 PPM 1.1 % PSTE Take by mouth.     spironolactone (ALDACTONE) 25 MG tablet Take 1 tablet by mouth once daily 90 tablet 0   TRELEGY ELLIPTA 100-62.5-25 MCG/ACT AEPB Inhale 1 puff into the lungs daily. 28 each 5   ursodiol (ACTIGALL) 300 MG capsule Take 2 capsules (600 mg total) by mouth 2 (two)  times daily with a meal. 120 capsule 11   XHANCE 93 MCG/ACT EXHU 1 spray each nostril 2 times daily as needed for stuffy nose. 16 mL 5   No current facility-administered medications for this visit.    Allergies   Allergies as of 11/07/2022 - Review Complete 11/07/2022  Allergen Reaction Noted   Versed [midazolam] Shortness Of Breath and Other (See Comments) 01/18/2016   Sulfa antibiotics  08/02/2018     Past Medical History   Past Medical History:  Diagnosis Date   Anxiety    Arthritis    Essential hypertension, benign    GERD (gastroesophageal reflux disease)    Herpes    History of kidney stones    Murmur    Obesity    Primary biliary cholangitis (HCC)    Sinusitis    Urinary frequency     Past Surgical History   Past Surgical History:  Procedure Laterality Date   BIOPSY  08/29/2018   Procedure: BIOPSY;  Surgeon: Daneil Dolin, MD;  [facility];  Service: Endoscopy;;  gastric   COLONOSCOPY  11/13/2012   Procedure: COLONOSCOPY;  Surgeon: Rogene Houston, MD;  [facility];  Service: Endoscopy;  Laterality: N/A;  930   embolization of uterine artery X 2     ESOPHAGOGASTRODUODENOSCOPY (EGD) WITH PROPOFOL N/A 08/29/2018   Procedure: ESOPHAGOGASTRODUODENOSCOPY (EGD) WITH PROPOFOL;  Surgeon: Daneil Dolin, MD;  [facility];  Service: Endoscopy;  Laterality: N/A;  3:00pm   INGUINAL LYMPH NODE BIOPSY Right 04/14/2016   Procedure: RIGHT INGUINAL LYMPH NODE BIOPSY;  Surgeon: Aviva Signs, MD;  [facility];  Service: General;  Laterality: Right;   IR ANGIO INTRA EXTRACRAN SEL COM CAROTID INNOMINATE BILAT MOD SED  02/12/2017   IR ANGIO VERTEBRAL SEL VERTEBRAL BILAT MOD SED  02/12/2017   IR GENERIC HISTORICAL  04/27/2016   IR RADIOLOGIST EVAL & MGMT 04/27/2016 Jacqulynn Cadet, MD [facility]   IR GENERIC HISTORICAL  12/05/2016   IR RADIOLOGIST EVAL & MGMT 12/05/2016 Jacqulynn Cadet, MD [facility]   IR RADIOLOGIST EVAL & MGMT   01/22/2017   IR RADIOLOGIST EVAL & MGMT  08/06/2018   IR RADIOLOGIST EVAL & MGMT  08/19/2020   IR RADIOLOGIST EVAL & MGMT  09/07/2021   Left inguinal hernia repair and endometriosis  2007   No definitive records    Left parotid bx benign  2005   RADIOLOGY WITH ANESTHESIA N/A 01/18/2016   Procedure: RADIOLOGY WITH ANESTHESIA;  Surgeon: Luanne Bras, MD;  Location: [place];  Service: Radiology;  Laterality: N/A;   RADIOLOGY WITH ANESTHESIA N/A 02/12/2017   Procedure: Cherie Dark EMBOLIZATION;  Surgeon: Luanne Bras, MD;  Location: [place];  Service: Radiology;  Laterality: N/A;   renal artery aneurysm repair right Right 02/5731   UMBILICAL HERNIA REPAIR  infancy     Past Family History   Family History  Problem Relation Age of Onset   Hypertension Mother    Hyperlipidemia Mother    Thyroid disease Mother    Coronary  artery disease Mother    Irregular heart beat Mother    Asthma Sister    Fibroids Sister    Hypertension Sister    Asthma Maternal Grandfather    Ulcerative colitis Son    Asthma Niece    Colon cancer Neg Hx     Past Social History   Social History   Socioeconomic History   Marital status: Married    Spouse name: Not on file   Number of children: Not on file   Years of education: Not on file   Highest education level: Associate degree: occupational, Hotel manager, or vocational program  Occupational History   Not on file  Tobacco Use   Smoking status: Former    Packs/day: 0.25    Years: 5.00    Total pack years: 1.25    Types: Cigarettes    Quit date: 1997    Years since quitting: 27.0   Smokeless tobacco: Never  Vaping Use   Vaping Use: Never used  Substance and Sexual Activity   Alcohol use: Not Currently    Comment: rare   Drug use: No   Sexual activity: Yes    Birth control/protection: None  Other Topics Concern   Not on file  Social History Narrative   Not on file   Social Determinants of Health   Financial Resource Strain: Not on file   Food Insecurity: Not on file  Transportation Needs: No Transportation Needs (07/15/2019)   PRAPARE - Hydrologist (Medical): No    Lack of Transportation (Non-Medical): No  Physical Activity: Not on file  Stress: Not on file  Social Connections: Not on file  Intimate Partner Violence: Not on file    Review of Systems   General: Negative for anorexia, weight loss, fever, chills, +mild fatigue. no weakness. ENT: Negative for hoarseness, difficulty swallowing , nasal congestion. CV: Negative for chest pain, angina, palpitations, dyspnea on exertion, peripheral edema.  Respiratory: Negative for dyspnea at rest, dyspnea on exertion, cough, sputum, wheezing.  GI: See history of present illness. GU:  Negative for dysuria, hematuria, urinary incontinence, urinary frequency, nocturnal urination.  Endo: Negative for unusual weight change.     Physical Exam   BP 126/85 (BP Location: Right Arm, Patient Position: Sitting, Cuff Size: Large)   Pulse 86   Temp 98.5 F (36.9 C) (Oral)   Ht '5\' 9"'$  (1.753 m)   Wt 193 lb 9.6 oz (87.8 kg)   LMP  (LMP Unknown)   BMI 28.59 kg/m    General: Well-nourished, well-developed in no acute distress.  Eyes: No icterus. Mouth: Oropharyngeal mucosa moist and pink  Lungs: Clear to auscultation bilaterally.  Heart: Regular rate and rhythm, no murmurs rubs or gallops.  Abdomen: Bowel sounds are normal, nontender, nondistended, no hepatosplenomegaly or masses,  no abdominal bruits or hernia , no rebound or guarding.  Rectal: not performed Extremities: No lower extremity edema. No clubbing or deformities. Neuro: Alert and oriented x 4   Skin: Warm and dry, no jaundice.   Psych: Alert and cooperative, normal mood and affect.  Labs   Lab Results  Component Value Date   CREATININE 1.01 (H) 09/05/2022   BUN 19 09/05/2022   NA 144 09/05/2022   K 5.0 09/05/2022   CL 103 09/05/2022   CO2 25 09/05/2022   Lab Results  Component  Value Date   ALT 41 (H) 11/02/2022   AST 39 11/02/2022   GGT 86 (H) 06/29/2022   ALKPHOS 127 (  H) 11/02/2022   BILITOT 0.4 11/02/2022   Lab Results  Component Value Date   WBC 4.4 09/05/2022   HGB 12.1 09/05/2022   HCT 37.7 09/05/2022   MCV 89 09/05/2022   PLT 231 09/05/2022   Lab Results  Component Value Date   TSH 1.220 09/05/2022   Lab Results  Component Value Date   GGT 86 (H) 06/29/2022      Imaging Studies   No results found.  Assessment   PBC: feels well. Tolerating urso '600mg'$  BID. She will start MVI with fat soluble vitamins ADEK. We will update labs in 3 months. She is aware we will continue close monitoring. If her AST/ALT/IgG do not improve with PBC treatment she may require liver biopsy to rule out overlapping autoimmune hepatitis.   Encounter for screening colonoscopy: colonoscopy planned in March per patient request.    PLAN   Labs in 3 months: GGT, LFTs, CBC, vitamin A, PT/INR, Hep B surf Ab, Hep A total Ab. Consider bone density study every two years due to risk of osteoporosis. We will discuss after next office visit. Colonoscopy in March.  I have discussed the risks, alternatives, benefits with regards to but not limited to the risk of reaction to medication, bleeding, infection, perforation and the patient is agreeable to proceed. Written consent to be obtained. Recommend she stay up on her adult vaccinations as appropriate.  Continue urso '600mg'$  BID.  Return ov in six months.     Our approach to long-term monitoring for patients with PBC includes  ?Liver biochemical and function tests (alanine aminotransferase [ALT], aspartate aminotransferase [AST], alkaline phosphatase, and total bilirubin) and platelet count every three to six months. ?Vitamin A level, vitamin D level, and prothrombin time annually.   ?Thyroid-stimulating hormone annually to screen for hypothyroidism. Hypothyroidism is common in patients with PBC and may coexist at diagnosis or  develop during the course of disease   ?Bone mineral densitometry every two years.  ?For patients with cirrhosis: Upper endoscopy every two to three years to screen for gastroesophageal varices. Liver ultrasound every six months to screen for hepatocellular carcinoma.    Laureen Ochs. Bobby Rumpf, [place], [facility]

## 2022-11-08 ENCOUNTER — Ambulatory Visit: Payer: Commercial Managed Care - PPO | Admitting: Gastroenterology

## 2022-11-10 ENCOUNTER — Encounter: Payer: Self-pay | Admitting: *Deleted

## 2022-11-17 ENCOUNTER — Telehealth: Payer: Self-pay | Admitting: *Deleted

## 2022-11-17 ENCOUNTER — Ambulatory Visit (HOSPITAL_COMMUNITY): Payer: Commercial Managed Care - PPO

## 2022-11-17 ENCOUNTER — Encounter: Payer: Self-pay | Admitting: Pulmonary Disease

## 2022-11-17 ENCOUNTER — Ambulatory Visit (INDEPENDENT_AMBULATORY_CARE_PROVIDER_SITE_OTHER): Payer: Commercial Managed Care - PPO | Admitting: Pulmonary Disease

## 2022-11-17 ENCOUNTER — Other Ambulatory Visit: Payer: Self-pay | Admitting: *Deleted

## 2022-11-17 ENCOUNTER — Ambulatory Visit (HOSPITAL_COMMUNITY)
Admission: RE | Admit: 2022-11-17 | Discharge: 2022-11-17 | Disposition: A | Discharge status: Home / Self Care | Payer: Commercial Managed Care - PPO | Source: Ambulatory Visit | Attending: Family Medicine | Admitting: Family Medicine

## 2022-11-17 ENCOUNTER — Encounter: Payer: Self-pay | Admitting: *Deleted

## 2022-11-17 VITALS — BP 130/68 | HR 80 | Ht 69.0 in | Wt 192.4 lb

## 2022-11-17 DIAGNOSIS — G4733 Obstructive sleep apnea (adult) (pediatric): Secondary | ICD-10-CM | POA: Diagnosis not present

## 2022-11-17 DIAGNOSIS — Z1231 Encounter for screening mammogram for malignant neoplasm of breast: Secondary | ICD-10-CM

## 2022-11-17 DIAGNOSIS — Z7189 Other specified counseling: Secondary | ICD-10-CM | POA: Diagnosis not present

## 2022-11-17 DIAGNOSIS — Z1211 Encounter for screening for malignant neoplasm of colon: Secondary | ICD-10-CM

## 2022-11-17 MED ORDER — PEG 3350-KCL-NA BICARB-NACL 420 G PO SOLR: 4000.0000 mL | Freq: Once | ORAL | 0 refills | Status: AC

## 2022-11-17 NOTE — Patient Instructions (Signed)
Will arrange for auto CPAP set up  Follow up in 4 months 

## 2022-11-17 NOTE — Telephone Encounter (Signed)
Pt has been scheduled for 12/18/22, instructions printed and given to pt and prep sent to the pharmacy.

## 2022-11-17 NOTE — Telephone Encounter (Signed)
[facility]  TCS screening, ASA 2 w/Dr.Rourk

## 2022-11-17 NOTE — Progress Notes (Signed)
[facility]  Chief Complaint  Patient presents with   Follow-up    F/u up on HST results and options     Past Surgical History:  She  has a past surgical history that includes Umbilical hernia repair (infancy ); Left inguinal hernia repair and endometriosis (2007); Left parotid bx benign (2005); embolization of uterine artery X 2; Colonoscopy (11/13/2012); Radiology with anesthesia (N/A, 01/18/2016); renal artery aneurysm repair right (Right, 01/2016); Inguinal lymph node biopsy (Right, 04/14/2016); ir generic historical (04/27/2016); ir generic historical (12/05/2016); IR Radiologist Eval & Mgmt (01/22/2017); IR ANGIO VERTEBRAL SEL VERTEBRAL BILAT MOD SED (02/12/2017); IR ANGIO INTRA EXTRACRAN SEL COM CAROTID INNOMINATE BILAT MOD SED (02/12/2017); Radiology with anesthesia (N/A, 02/12/2017); IR Radiologist Eval & Mgmt (08/06/2018); Esophagogastroduodenoscopy (egd) with propofol (N/A, 08/29/2018); biopsy (08/29/2018); IR Radiologist Eval & Mgmt (08/19/2020); and IR Radiologist Eval & Mgmt (09/07/2021).  Past Medical History:  Anxiety, Arthritis, HTN, GERD, Nephrolithiasis, Rt renal artery aneurysm, Allergies  Constitutional:  BP 130/68   Pulse 80   Ht 5' 9"$  (1.753 m)   Wt 192 lb 6.4 oz (87.3 kg)   LMP  (LMP Unknown)   SpO2 96% Comment: ra  BMI 28.41 kg/m   Brief Summary:  Allison Hart is a 62 y.o. female former smoker with obstructive sleep apnea.      Subjective:   Home sleep study showed mild sleep apnea.  Still snoring and waking up to use bathroom at night.  Seen by allergist for asthma.  Concerned about cost of trelegy.   Physical Exam:   Appearance - well kempt   ENMT - no sinus tenderness, no oral exudate, no LAN, Mallampati 4 airway, no stridor  Respiratory - equal breath sounds bilaterally, no wheezing or rales  CV - s1s2 regular rate and rhythm, no murmurs  Ext - no clubbing, no edema  Skin - no rashes  Psych - normal  mood and affect   Sleep Tests:  HST 09/30/22 >> AHI 9.7, SpO2 low 63%.   Cardiac Tests:  Echo 03/28/17 >> EF 55 to 60%, PAS 46 mmHg  Social History:  She  reports that she quit smoking about 27 years ago. Her smoking use included cigarettes. She has a 1.25 pack-year smoking history. She has never used smokeless tobacco. She reports that she does not currently use alcohol. She reports that she does not use drugs.  Family History:  Her family history includes Asthma in her maternal grandfather, niece, and sister; Coronary artery disease in her mother; Fibroids in her sister; Hyperlipidemia in her mother; Hypertension in her mother and sister; Irregular heart beat in her mother; Thyroid disease in her mother; Ulcerative colitis in her son.     Assessment/Plan:   Obstructive sleep apnea. - sleep study reviewed - discussed how sleep apnea can impact her health - treatment options discussed - will arrange for [employer] auto CPAP set up  Hx of TMJ, Bruxism. - follow up with dentist  Allergic asthma. - followed by Dr. Salvatore Marvel with [facility]  Time Spent Involved in Patient Care on Day of Examination:  26 minutes  Follow up:   Patient Instructions  Will arrange for auto CPAP set up  Follow up in 4 months  Medication List:   Allergies as of 11/17/2022       Reactions   Versed [midazolam] Shortness Of Breath, Other (See Comments)   Sweating, anxious and "I felt like I was going to pass out"  Sulfa Antibiotics         Medication List        Accurate as of November 17, 2022 10:57 AM. If you have any questions, ask your nurse or doctor.          acyclovir 400 MG tablet Commonly known as: ZOVIRAX Take 1 tablet (400 mg total) by mouth 2 (two) times daily.   albuterol 108 (90 Base) MCG/ACT inhaler Commonly known as: VENTOLIN HFA Inhale 2 puffs into the lungs every 6 (six) hours as needed for wheezing or shortness of breath.   amlodipine-olmesartan 10-20  MG tablet Commonly known as: AZOR Take 1 tablet by mouth once daily   aspirin EC 81 MG tablet Take 1 tablet (81 mg total) by mouth daily.   Azelastine HCl 137 MCG/SPRAY Soln Place 2 sprays into both nostrils 2 (two) times daily.   cholecalciferol 25 MCG (1000 UNIT) tablet Commonly known as: VITAMIN D3 Take 1,000 Units by mouth daily.   furosemide 40 MG tablet Commonly known as: LASIX Take 1 tablet by mouth once daily   montelukast 10 MG tablet Commonly known as: SINGULAIR TAKE 1 TABLET BY MOUTH AT BEDTIME   potassium chloride SA 20 MEQ tablet Commonly known as: Klor-Con M20 TAKE 1  BY MOUTH TWICE DAILY   Sodium Fluoride 5000 PPM 1.1 % Pste Generic drug: Sodium Fluoride Take by mouth.   spironolactone 25 MG tablet Commonly known as: ALDACTONE Take 1 tablet by mouth once daily   Trelegy Ellipta 100-62.5-25 MCG/ACT Aepb Generic drug: Fluticasone-Umeclidin-Vilant Inhale 1 puff into the lungs daily.   Trelegy Ellipta 100-62.5-25 MCG/ACT Aepb Generic drug: Fluticasone-Umeclidin-Vilant Inhale 1 puff into the lungs daily.   ursodiol 300 MG capsule Commonly known as: ACTIGALL Take 2 capsules (600 mg total) by mouth 2 (two) times daily with a meal.   VITAMIN B-6 PO Take 1 tablet by mouth daily.   Xhance 93 MCG/ACT Exhu Generic drug: Fluticasone Propionate 1 spray each nostril 2 times daily as needed for stuffy nose.        Signature:  Chesley Mires, MD [place] Pager - [phone] 11/17/2022, 10:57 AM

## 2022-11-20 ENCOUNTER — Encounter: Payer: Self-pay | Admitting: Family Medicine

## 2022-11-20 NOTE — Telephone Encounter (Signed)
It's unfortunate that the insurance coverage is not great as it sounds like this medication is really providing a great benefit. If you want to keep Trelegy, please fill out the patient assistance paperwork and we will see what we get. If still not affordable we will need to get a different asthma medication or medications to replace Trelegy. Please let this patient know there is a sample of trelegy that she can pick up at our clinic that should get her through until her appointment with Dr. Ernst Bowler. We may need to get the sample to the [place] office for pick up on Wednesday or she can pick up in [facility] Monday or Tuesday. Whichever is most convenient for her.  Thank you

## 2022-11-22 ENCOUNTER — Telehealth: Payer: Self-pay

## 2022-11-22 NOTE — Telephone Encounter (Signed)
Patient dropped off application for [employer] to receive Trelegy for free or a low cost. I have send the application to fax number [phone]. Patient was given a copy as well for her files.

## 2022-12-05 NOTE — Patient Instructions (Incomplete)
Allergic rhinitis Continue allergen avoidance measures directed toward mold, dust mite, and cockroach as listed below Continue Claritin 10 mg once a day as needed for runny nose or itch Continue montelukast 10 mg once a day to help control allergy symptoms Continue azelastine 2 sprays in each nostril twice a day as needed for runny nose or nasal symptoms Continue Xhance 1 spray in each nostril twice a day as needed for stuffy nose Consider saline nasal rinses as needed for nasal symptoms. Use this before any medicated nasal sprays for best result If your symptoms are not well controlled with the treatment plan as listed above, consider allergen immunotherapy  Shortness of breath Continue Trelegy 100-1 puff once a day to prevent cough or wheeze Continue montelukast 10 mg once a day to prevent cough or wheeze Continue albuterol 2 puffs once every 4 hours as needed for cough or wheeze You may use albuterol 2 puffs 5 to 15 minutes before activity to decrease cough or wheeze  Food allergy Your skin testing was negative to pecan and lab testing was negative to pecan. In case of an allergic reaction, take Benadryl 50 mg every 4 hours, and if life-threatening symptoms occur, inject with EpiPen 0.3 mg. Call the clinic and schedule an in office oral food challenge to pecan if interested.  Remember to stop antihistamines for 3 days before the test.  Call the clinic if this treatment plan is not working well for you  Follow up in 6 months or sooner if needed.  Control of Mold Allergen Mold and fungi can grow on a variety of surfaces provided certain temperature and moisture conditions exist.  Outdoor molds grow on plants, decaying vegetation and soil.  The major outdoor mold, Alternaria and Cladosporium, are found in very high numbers during hot and dry conditions.  Generally, a late Summer - Fall peak is seen for common outdoor fungal spores.  Rain will temporarily lower outdoor mold spore count, but  counts rise rapidly when the rainy period ends.  The most important indoor molds are Aspergillus and Penicillium.  Dark, humid and poorly ventilated basements are ideal sites for mold growth.  The next most common sites of mold growth are the bathroom and the kitchen.  Outdoor [employer] Use air conditioning and keep windows closed Avoid exposure to decaying vegetation. Avoid leaf raking. Avoid grain handling. Consider wearing a face mask if working in moldy areas.  Indoor Mold Control Maintain humidity below 50%. Clean washable surfaces with 5% bleach solution. Remove sources e.g. Contaminated carpets.  Control of Cockroach Allergen Cockroach allergen has been identified as an important cause of acute attacks of asthma, especially in urban settings.  There are fifty-five species of cockroach that exist in the [place], however only three, the [place], [place] species produce allergen that can affect patients with Asthma.  Allergens can be obtained from fecal particles, egg casings and secretions from cockroaches.    Remove food sources. Reduce access to water. Seal access and entry points. Spray runways with 0.5-1% Diazinon or Chlorpyrifos Blow boric acid power under stoves and refrigerator. Place bait stations (hydramethylnon) at feeding sites.    Control of Dust Mite Allergen Dust mites play a major role in allergic asthma and rhinitis. They occur in environments with high humidity wherever human skin is found. Dust mites absorb humidity from the atmosphere (ie, they do not drink) and feed on organic matter (including shed human and animal skin). Dust mites are a microscopic type of  insect that you cannot see with the naked eye. High levels of dust mites have been detected from mattresses, pillows, carpets, upholstered furniture, bed covers, clothes, soft toys and any woven material. The principal allergen of the dust mite is found in its feces. A gram of dust may  contain 1,000 mites and 250,000 fecal particles. Mite antigen is easily measured in the air during house cleaning activities. Dust mites do not bite and do not cause harm to humans, other than by triggering allergies/asthma.  Ways to decrease your exposure to dust mites in your home:  1. Encase mattresses, box springs and pillows with a mite-impermeable barrier or cover  2. Wash sheets, blankets and drapes weekly in hot water (130 F) with detergent and dry them in a dryer on the hot setting.  3. Have the room cleaned frequently with a vacuum cleaner and a damp dust-mop. For carpeting or rugs, vacuuming with a vacuum cleaner equipped with a high-efficiency particulate air (HEPA) filter. The dust mite allergic individual should not be in a room which is being cleaned and should wait 1 hour after cleaning before going into the room.  4. Do not sleep on upholstered furniture (eg, couches).  5. If possible removing carpeting, upholstered furniture and drapery from the home is ideal. Horizontal blinds should be eliminated in the rooms where the person spends the most time (bedroom, study, television room). Washable vinyl, roller-type shades are optimal.  6. Remove all non-washable stuffed toys from the bedroom. Wash stuffed toys weekly like sheets and blankets above.  7. Reduce indoor humidity to less than 50%. Inexpensive humidity monitors can be purchased at most hardware stores. Do not use a humidifier as can make the problem worse and are not recommended.

## 2022-12-05 NOTE — Progress Notes (Signed)
[address] [place] [address] Dept: [phone]  FOLLOW UP NOTE  Patient ID: Allison Hart, female    DOB: [birth year]  Age: 62 y.o. MRN: [id number] Date of Office Visit: 12/06/2022  Assessment  Chief Complaint: Follow-up  HPI Allison Hart is a 62 year old female who presents to the clinic for follow-up visit.  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  She reports over the last several years that she has experienced an increased amount of stress including death in the family and medical issues with her husband.  She reports these stressors are possibly taking a toll on her body at this time.  She is not currently seeing a mental health provider.  At today's visit, she reports her breathing has been moderately well-controlled with only occasional shortness of breath.  She denies wheezing or coughing with activity or rest.  She continues montelukast 10 mg once a day, Trelegy 100-1 puff once a day and is using albuterol before activity with relief of symptoms.    Allergic rhinitis is reported as moderately well-controlled with frequent nasal congestion as the main symptom.  She continues Claritin 10 mg once a day, Xhance 2 sprays in each nostril twice a day, and occasionally uses azelastine.  Her last environmental allergy testing was on 07/31/2022 was positive to mold, dust mite, and cockroach.   She continues to avoid pecans for the most part.  She does report that she occasionally eats a piece here and there with no adverse reaction.  Her last food allergy testing was on 07/31/2022 was negative to pecan.  Subsequent lab testing was negative to pecan on 08/02/2022.  She is not interested in having an EpiPen or in coming in for an in office oral food challenge at this time.  She reports that she will call our clinic if she is interested in having an  EpiPen.  Her current medications are listed in the chart.   Drug Allergies:  Allergies  Allergen Reactions   Versed [Midazolam] Shortness Of Breath and Other (See Comments)    Sweating, anxious and "I felt like I was going to pass out"   Sulfa Antibiotics     Physical Exam: BP 130/72   Pulse (!) 103   Temp 98.3 F (36.8 C)   Resp 20   Ht 5\' 9"  (1.753 m)   Wt 196 lb 9.6 oz (89.2 kg)   LMP  (LMP Unknown)   SpO2 98%   BMI 29.03 kg/m    Physical Exam Vitals reviewed.  Constitutional:      Appearance: Normal appearance.  HENT:     Head: Normocephalic and atraumatic.     Right Ear: Tympanic membrane normal.     Left Ear: Tympanic membrane normal.     Nose:     Comments: Bilateral nares distant pale with clear nasal drainage noted.  Pharynx normal.  Ears normal.  Eyes normal.    Mouth/Throat:     Pharynx: Oropharynx is clear.  Eyes:     Conjunctiva/sclera: Conjunctivae normal.  Cardiovascular:     Rate and Rhythm: Normal rate and regular rhythm.     Heart sounds: Normal heart sounds. No murmur heard. Pulmonary:     Effort: Pulmonary effort is normal.     Breath sounds: Normal breath sounds.     Comments: Lungs clear to auscultation Musculoskeletal:  General: Normal range of motion.     Cervical back: Normal range of motion and neck supple.  Skin:    General: Skin is warm and dry.  Neurological:     Mental Status: She is alert and oriented to person, place, and time.  Psychiatric:        Mood and Affect: Mood normal.        Behavior: Behavior normal.        Thought Content: Thought content normal.        Judgment: Judgment normal.     Comments: Expressing grief over the death in her family and medical conditions involving family members     Diagnostics: FVC 1.72, FEV1 1.36.  Predicted FVC 3.20, predicted FEV1 2.51.  Spirometry indicates restriction.  This is consistent with previous spirometry readings.  Assessment and Plan: 1. Shortness of breath    2. Seasonal and perennial allergic rhinitis   3. Anaphylactic shock due to food, subsequent encounter     Patient Instructions  Allergic rhinitis Continue allergen avoidance measures directed toward mold, dust mite, and cockroach as listed below Continue Claritin 10 mg once a day as needed for runny nose or itch Continue montelukast 10 mg once a day to help control allergy symptoms Continue azelastine 2 sprays in each nostril twice a day as needed for runny nose or nasal symptoms Continue Xhance 1 spray in each nostril twice a day as needed for stuffy nose Consider saline nasal rinses as needed for nasal symptoms. Use this before any medicated nasal sprays for best result If your symptoms are not well controlled with the treatment plan as listed above, consider allergen immunotherapy  Shortness of breath Continue Trelegy 100-1 puff once a day to prevent cough or wheeze Continue montelukast 10 mg once a day to prevent cough or wheeze Continue albuterol 2 puffs once every 4 hours as needed for cough or wheeze You may use albuterol 2 puffs 5 to 15 minutes before activity to decrease cough or wheeze  Food allergy Your skin testing was negative to pecan and lab testing was negative to pecan. In case of an allergic reaction, take Benadryl 50 mg every 4 hours, and if life-threatening symptoms occur, inject with EpiPen 0.3 mg. Call the clinic and schedule an in office oral food challenge to pecan if interested.  Remember to stop antihistamines for 3 days before the test.  Call the clinic if this treatment plan is not working well for you  Follow up in 6 months or sooner if needed.   Return in about 6 months (around 06/06/2023), or if symptoms worsen or fail to improve.    Thank you for the opportunity to care for this patient.  Please do not hesitate to contact me with questions.  Thermon Leyland, FNP [facility]

## 2022-12-06 ENCOUNTER — Encounter: Payer: Self-pay | Admitting: Family Medicine

## 2022-12-06 ENCOUNTER — Ambulatory Visit (INDEPENDENT_AMBULATORY_CARE_PROVIDER_SITE_OTHER): Payer: Commercial Managed Care - PPO | Admitting: Family Medicine

## 2022-12-06 ENCOUNTER — Other Ambulatory Visit: Payer: Self-pay

## 2022-12-06 VITALS — BP 130/72 | HR 103 | Temp 98.3°F | Resp 20 | Ht 69.0 in | Wt 196.6 lb

## 2022-12-06 DIAGNOSIS — J3089 Other allergic rhinitis: Secondary | ICD-10-CM

## 2022-12-06 DIAGNOSIS — T7800XD Anaphylactic reaction due to unspecified food, subsequent encounter: Secondary | ICD-10-CM | POA: Diagnosis not present

## 2022-12-06 DIAGNOSIS — R0602 Shortness of breath: Secondary | ICD-10-CM | POA: Diagnosis not present

## 2022-12-06 DIAGNOSIS — J302 Other seasonal allergic rhinitis: Secondary | ICD-10-CM

## 2022-12-07 ENCOUNTER — Encounter: Payer: Self-pay | Admitting: Family Medicine

## 2022-12-07 ENCOUNTER — Other Ambulatory Visit: Payer: Self-pay | Admitting: Family Medicine

## 2022-12-07 ENCOUNTER — Encounter: Payer: Self-pay | Admitting: Radiology

## 2022-12-11 ENCOUNTER — Other Ambulatory Visit (HOSPITAL_COMMUNITY)
Admission: RE | Admit: 2022-12-11 | Discharge: 2022-12-11 | Disposition: A | Discharge status: Home / Self Care | Payer: Commercial Managed Care - PPO | Source: Ambulatory Visit | Attending: Internal Medicine | Admitting: Internal Medicine

## 2022-12-11 DIAGNOSIS — Z1211 Encounter for screening for malignant neoplasm of colon: Secondary | ICD-10-CM | POA: Insufficient documentation

## 2022-12-11 LAB — BASIC METABOLIC PANEL
Anion gap: 9 (ref 5–15)
BUN: 17 mg/dL (ref 6–20)
CO2: 28 mmol/L (ref 22–32)
Calcium: 9.1 mg/dL (ref 8.9–10.3)
Chloride: 98 mmol/L (ref 98–111)
Creatinine, Ser: 1.05 mg/dL — ABNORMAL HIGH (ref 0.44–1.00)
GFR, Estimated: >60 mL/min (ref >60)
Glucose, Bld: 94 mg/dL (ref 70–99)
Potassium: 3.8 mmol/L (ref 3.5–5.1)
Sodium: 135 mmol/L (ref 135–145)

## 2022-12-18 ENCOUNTER — Ambulatory Visit (HOSPITAL_COMMUNITY): Payer: Commercial Managed Care - PPO | Admitting: Certified Registered Nurse Anesthetist

## 2022-12-18 ENCOUNTER — Encounter (HOSPITAL_COMMUNITY)
Admission: RE | Disposition: A | Discharge status: Home / Self Care | Payer: Self-pay | Source: Home / Self Care | Attending: Internal Medicine

## 2022-12-18 ENCOUNTER — Ambulatory Visit (HOSPITAL_COMMUNITY)
Admission: RE | Admit: 2022-12-18 | Discharge: 2022-12-18 | Disposition: A | Discharge status: Home / Self Care | Payer: Commercial Managed Care - PPO | Attending: Internal Medicine | Admitting: Internal Medicine

## 2022-12-18 ENCOUNTER — Encounter (HOSPITAL_COMMUNITY): Payer: Self-pay | Admitting: Internal Medicine

## 2022-12-18 ENCOUNTER — Other Ambulatory Visit: Payer: Self-pay

## 2022-12-18 ENCOUNTER — Ambulatory Visit (HOSPITAL_BASED_OUTPATIENT_CLINIC_OR_DEPARTMENT_OTHER): Payer: Commercial Managed Care - PPO | Admitting: Certified Registered Nurse Anesthetist

## 2022-12-18 DIAGNOSIS — K640 First degree hemorrhoids: Secondary | ICD-10-CM | POA: Insufficient documentation

## 2022-12-18 DIAGNOSIS — G4730 Sleep apnea, unspecified: Secondary | ICD-10-CM | POA: Insufficient documentation

## 2022-12-18 DIAGNOSIS — K5730 Diverticulosis of large intestine without perforation or abscess without bleeding: Secondary | ICD-10-CM | POA: Insufficient documentation

## 2022-12-18 DIAGNOSIS — I10 Essential (primary) hypertension: Secondary | ICD-10-CM | POA: Insufficient documentation

## 2022-12-18 DIAGNOSIS — Z87891 Personal history of nicotine dependence: Secondary | ICD-10-CM | POA: Insufficient documentation

## 2022-12-18 DIAGNOSIS — K219 Gastro-esophageal reflux disease without esophagitis: Secondary | ICD-10-CM | POA: Diagnosis not present

## 2022-12-18 DIAGNOSIS — Z6828 Body mass index (BMI) 28.0-28.9, adult: Secondary | ICD-10-CM | POA: Diagnosis not present

## 2022-12-18 DIAGNOSIS — F419 Anxiety disorder, unspecified: Secondary | ICD-10-CM | POA: Diagnosis not present

## 2022-12-18 DIAGNOSIS — E669 Obesity, unspecified: Secondary | ICD-10-CM | POA: Insufficient documentation

## 2022-12-18 DIAGNOSIS — Z1211 Encounter for screening for malignant neoplasm of colon: Secondary | ICD-10-CM | POA: Insufficient documentation

## 2022-12-18 DIAGNOSIS — G709 Myoneural disorder, unspecified: Secondary | ICD-10-CM | POA: Insufficient documentation

## 2022-12-18 HISTORY — PX: COLONOSCOPY WITH PROPOFOL: SHX5780

## 2022-12-18 HISTORY — DX: Sleep apnea, unspecified: G47.30

## 2022-12-18 SURGERY — COLONOSCOPY WITH PROPOFOL
Anesthesia: General

## 2022-12-18 MED ORDER — PROPOFOL 500 MG/50ML IV EMUL
INTRAVENOUS | Status: AC
  Filled 2022-12-18: qty 50

## 2022-12-18 MED ORDER — LACTATED RINGERS IV SOLN: INTRAVENOUS | Status: DC | PRN

## 2022-12-18 MED ORDER — PROPOFOL 500 MG/50ML IV EMUL
INTRAVENOUS | Status: DC | PRN
  Administered 2022-12-18: 150 ug/kg/min via INTRAVENOUS

## 2022-12-18 MED ORDER — LACTATED RINGERS IV SOLN: INTRAVENOUS | Status: DC

## 2022-12-18 MED ORDER — PROPOFOL 10 MG/ML IV BOLUS
INTRAVENOUS | Status: DC | PRN
  Administered 2022-12-18: 60 mg via INTRAVENOUS

## 2022-12-18 NOTE — Anesthesia Postprocedure Evaluation (Signed)
Anesthesia Post Note  Patient: Allison Hart  Procedure(s) Performed: COLONOSCOPY WITH PROPOFOL  Patient location during evaluation: Phase II Anesthesia Type: General Level of consciousness: awake Pain management: pain level controlled Vital Signs Assessment: post-procedure vital signs reviewed and stable Respiratory status: spontaneous breathing and respiratory function stable Cardiovascular status: blood pressure returned to baseline and stable Postop Assessment: no headache and no apparent nausea or vomiting Anesthetic complications: no Comments: Late entry   No notable events documented.   Last Vitals:  Vitals:   12/18/22 0701 12/18/22 0754  BP: 123/86 93/66  Pulse: 70 93  Resp: 17 16  Temp: 37.3 C (!) 36.1 C  SpO2: 99% 93%    Last Pain:  Vitals:   12/18/22 0754  TempSrc: Axillary  PainSc: 0-No pain                 Louann Sjogren

## 2022-12-18 NOTE — Anesthesia Procedure Notes (Signed)
Procedure Name: General with mask airway Date/Time: 12/18/2022 7:34 AM  Performed by: Maude Leriche, CRNAPre-anesthesia Checklist: Patient identified, Emergency Drugs available, Suction available, Patient being monitored and Timeout performed Patient Re-evaluated:Patient Re-evaluated prior to induction Oxygen Delivery Method: Nasal cannula Placement Confirmation: positive ETCO2 Dental Injury: Teeth and Oropharynx as per pre-operative assessment

## 2022-12-18 NOTE — Anesthesia Preprocedure Evaluation (Signed)
Anesthesia Evaluation  Patient identified by MRN, date of birth, ID band Patient awake    Reviewed: Allergy & Precautions, H&P , NPO status , Patient's Chart, lab work & pertinent test results, reviewed documented beta blocker date and time   Airway Mallampati: II  TM Distance: >3 FB Neck ROM: full    Dental no notable dental hx.    Pulmonary neg pulmonary ROS, shortness of breath, sleep apnea , former smoker   Pulmonary exam normal breath sounds clear to auscultation       Cardiovascular Exercise Tolerance: Good hypertension, negative cardio ROS + Valvular Problems/Murmurs  Rhythm:regular Rate:Normal     Neuro/Psych   Anxiety      Neuromuscular disease negative neurological ROS  negative psych ROS   GI/Hepatic negative GI ROS, Neg liver ROS,GERD  ,,(+) Hepatitis -  Endo/Other  negative endocrine ROS    Renal/GU negative Renal ROS  negative genitourinary   Musculoskeletal   Abdominal   Peds  Hematology negative hematology ROS (+)   Anesthesia Other Findings   Reproductive/Obstetrics negative OB ROS                             Anesthesia Physical Anesthesia Plan  ASA: 3  Anesthesia Plan: General   Post-op Pain Management:    Induction:   PONV Risk Score and Plan: Propofol infusion  Airway Management Planned:   Additional Equipment:   Intra-op Plan:   Post-operative Plan:   Informed Consent: I have reviewed the patients History and Physical, chart, labs and discussed the procedure including the risks, benefits and alternatives for the proposed anesthesia with the patient or authorized representative who has indicated his/her understanding and acceptance.     Dental Advisory Given  Plan Discussed with: CRNA  Anesthesia Plan Comments:        Anesthesia Quick Evaluation

## 2022-12-18 NOTE — Discharge Instructions (Addendum)
  Colonoscopy Discharge Instructions  Read the instructions outlined below and refer to this sheet in the next few weeks. These discharge instructions provide you with general information on caring for yourself after you leave the hospital. Your doctor may also give you specific instructions. While your treatment has been planned according to the most current medical practices available, unavoidable complications occasionally occur. If you have any problems or questions after discharge, call Dr. Gala Romney at [phone]. ACTIVITY You may resume your regular activity, but move at a slower pace for the next 24 hours.  Take frequent rest periods for the next 24 hours.  Walking will help get rid of the air and reduce the bloated feeling in your belly (abdomen).  No driving for 24 hours (because of the medicine (anesthesia) used during the test).   Do not sign any important legal documents or operate any machinery for 24 hours (because of the anesthesia used during the test).  NUTRITION Drink plenty of fluids.  You may resume your normal diet as instructed by your doctor.  Begin with a light meal and progress to your normal diet. Heavy or fried foods are harder to digest and may make you feel sick to your stomach (nauseated).  Avoid alcoholic beverages for 24 hours or as instructed.  MEDICATIONS You may resume your normal medications unless your doctor tells you otherwise.  WHAT YOU CAN EXPECT TODAY Some feelings of bloating in the abdomen.  Passage of more gas than usual.  Spotting of blood in your stool or on the toilet paper.  IF YOU HAD POLYPS REMOVED DURING THE COLONOSCOPY: No aspirin products for 7 days or as instructed.  No alcohol for 7 days or as instructed.  Eat a soft diet for the next 24 hours.  FINDING OUT THE RESULTS OF YOUR TEST Not all test results are available during your visit. If your test results are not back during the visit, make an appointment with your caregiver to find out the  results. Do not assume everything is normal if you have not heard from your caregiver or the medical facility. It is important for you to follow up on all of your test results.  SEEK IMMEDIATE MEDICAL ATTENTION IF: You have more than a spotting of blood in your stool.  Your belly is swollen (abdominal distention).  You are nauseated or vomiting.  You have a temperature over 101.  You have abdominal pain or discomfort that is severe or gets worse throughout the day.    No polyps found today  Diverticulosis present.  Diverticulosis information provided  Recommend repeat colonoscopy in 10 years for screening purposes  At patient request,, I called Elois Simpson at [phone] rolled to voicemail.  Left a message.

## 2022-12-18 NOTE — H&P (Signed)
$'@LOGO'o$ @   Primary Care Physician:  Fayrene Helper, MD Primary Gastroenterologist:  Dr. Gala Romney  Pre-Procedure History & Physical: HPI:  Allison Hart is a 62 y.o. female here for average rescreening colonoscopy negative colonoscopy 2014.  At negative GI symptoms.  Past Medical History:  Diagnosis Date   Anxiety    Arthritis    Essential hypertension, benign    GERD (gastroesophageal reflux disease)    Herpes    History of kidney stones    Murmur    Obesity    Primary biliary cholangitis (HCC)    Sinusitis    Sleep apnea    Urinary frequency     Past Surgical History:  Procedure Laterality Date   BIOPSY  08/29/2018   Procedure: BIOPSY;  Surgeon: Daneil Dolin, MD;  [facility];  Service: Endoscopy;;  gastric   COLONOSCOPY  11/13/2012   Procedure: COLONOSCOPY;  Surgeon: Rogene Houston, MD;  [facility];  Service: Endoscopy;  Laterality: N/A;  930   embolization of uterine artery X 2     ESOPHAGOGASTRODUODENOSCOPY (EGD) WITH PROPOFOL N/A 08/29/2018   Procedure: ESOPHAGOGASTRODUODENOSCOPY (EGD) WITH PROPOFOL;  Surgeon: Daneil Dolin, MD;  [facility];  Service: Endoscopy;  Laterality: N/A;  3:00pm   INGUINAL LYMPH NODE BIOPSY Right 04/14/2016   Procedure: RIGHT INGUINAL LYMPH NODE BIOPSY;  Surgeon: Aviva Signs, MD;  Location: [facility];  Service: General;  Laterality: Right;   IR ANGIO INTRA EXTRACRAN SEL COM CAROTID INNOMINATE BILAT MOD SED  02/12/2017   IR ANGIO VERTEBRAL SEL VERTEBRAL BILAT MOD SED  02/12/2017   IR GENERIC HISTORICAL  04/27/2016   IR RADIOLOGIST EVAL & MGMT 04/27/2016 Jacqulynn Cadet, MD [facility]   IR GENERIC HISTORICAL  12/05/2016   IR RADIOLOGIST EVAL & MGMT 12/05/2016 Jacqulynn Cadet, MD [facility]   IR RADIOLOGIST EVAL & MGMT  01/22/2017   IR RADIOLOGIST EVAL & MGMT  08/06/2018   IR RADIOLOGIST EVAL & MGMT  08/19/2020   IR RADIOLOGIST EVAL & MGMT  09/07/2021   Left inguinal hernia repair and  endometriosis  2007   No definitive records    Left parotid bx benign  2005   RADIOLOGY WITH ANESTHESIA N/A 01/18/2016   Procedure: RADIOLOGY WITH ANESTHESIA;  Surgeon: Luanne Bras, MD;  Location: [place];  Service: Radiology;  Laterality: N/A;   RADIOLOGY WITH ANESTHESIA N/A 02/12/2017   Procedure: Cherie Dark EMBOLIZATION;  Surgeon: Luanne Bras, MD;  Location: [place];  Service: Radiology;  Laterality: N/A;   renal artery aneurysm repair right Right Q000111Q   UMBILICAL HERNIA REPAIR  infancy     Prior to Admission medications   Medication Sig Start Date End Date Taking? Authorizing Provider  albuterol (VENTOLIN HFA) 108 (90 Base) MCG/ACT inhaler Inhale 2 puffs into the lungs every 6 (six) hours as needed for wheezing or shortness of breath. 07/26/22  Yes Valentina Shaggy, MD  amlodipine-olmesartan [facility]) 10-20 MG tablet Take 1 tablet by mouth once daily 10/31/22  Yes Fayrene Helper, MD  aspirin EC 81 MG tablet Take 1 tablet (81 mg total) by mouth daily. 07/30/18  Yes Fayrene Helper, MD  Azelastine HCl 137 MCG/SPRAY SOLN USE 2 SPRAY(S) IN EACH NOSTRIL TWICE DAILY AS DIRECTED 12/07/22  Yes Fayrene Helper, MD  cholecalciferol (VITAMIN D) 1000 units tablet Take 1,000 Units by mouth daily.   Yes [provider]  Cyanocobalamin (VITAMIN B-12 PO) Take 1 tablet by mouth daily.  Yes [provider]  furosemide (LASIX) 40 MG tablet Take 1 tablet by mouth once daily 10/16/22  Yes Fayrene Helper, MD  montelukast (SINGULAIR) 10 MG tablet TAKE 1 TABLET BY MOUTH AT BEDTIME 10/31/22  Yes Fayrene Helper, MD  Omega-3 Fatty Acids (OMEGA 3 PO) Take 1 tablet by mouth every other day.   Yes [provider]  potassium chloride SA (KLOR-CON M20) 20 MEQ tablet TAKE 1  BY MOUTH TWICE DAILY 10/16/22  Yes Fayrene Helper, MD  Pyridoxine HCl (VITAMIN B-6 PO) Take 1 tablet by mouth daily.   Yes [provider]  SODIUM FLUORIDE 5000 PPM 1.1 % PSTE Use as  directed 1 application  in the mouth or throat daily. 07/27/22  Yes [provider]  spironolactone (ALDACTONE) 25 MG tablet Take 1 tablet by mouth once daily 10/16/22  Yes Fayrene Helper, MD  TRELEGY ELLIPTA 100-62.5-25 MCG/ACT AEPB Inhale 1 puff into the lungs daily. 08/11/22  Yes Valentina Shaggy, MD  trolamine salicylate (ASPERCREME) 10 % cream Apply 1 Application topically as needed (Knee pain).   Yes [provider]  ursodiol (ACTIGALL) 300 MG capsule Take 2 capsules (600 mg total) by mouth 2 (two) times daily with a meal. 09/13/22  Yes Mahala Menghini, PA-C  XHANCE 93 MCG/ACT EXHU 1 spray each nostril 2 times daily as needed for stuffy nose. 08/11/22  Yes Valentina Shaggy, MD  acyclovir (ZOVIRAX) 400 MG tablet Take 1 tablet (400 mg total) by mouth 2 (two) times daily. Patient taking differently: Take 400 mg by mouth daily as needed. 07/22/22   Fayrene Helper, MD    Allergies as of 11/17/2022 - Review Complete 11/17/2022  Allergen Reaction Noted   Versed [midazolam] Shortness Of Breath and Other (See Comments) 01/18/2016   Sulfa antibiotics  08/02/2018    Family History  Problem Relation Age of Onset   Hypertension Mother    Hyperlipidemia Mother    Thyroid disease Mother    Coronary artery disease Mother    Irregular heart beat Mother    Asthma Sister    Fibroids Sister    Hypertension Sister    Asthma Maternal Grandfather    Ulcerative colitis Son    Asthma Niece    Colon cancer Neg Hx     Social History   Socioeconomic History   Marital status: Married    Spouse name: Not on file   Number of children: Not on file   Years of education: Not on file   Highest education level: Associate degree: occupational, Hotel manager, or vocational program  Occupational History   Not on file  Tobacco Use   Smoking status: Former    Packs/day: 0.25    Years: 5.00    Total pack years: 1.25    Types: Cigarettes    Quit date: 1997    Years since  quitting: 27.2   Smokeless tobacco: Never  Vaping Use   Vaping Use: Never used  Substance and Sexual Activity   Alcohol use: Not Currently    Comment: rare   Drug use: No   Sexual activity: Yes    Birth control/protection: None  Other Topics Concern   Not on file  Social History Narrative   Not on file   Social Determinants of Health   Financial Resource Strain: Not on file  Food Insecurity: Not on file  Transportation Needs: No Transportation Needs (07/15/2019)   PRAPARE - Hydrologist (Medical):  No    Lack of Transportation (Non-Medical): No  Physical Activity: Not on file  Stress: Not on file  Social Connections: Not on file  Intimate Partner Violence: Not on file    Review of Systems: See HPI, otherwise negative ROS  Physical Exam: BP 123/86   Pulse 70   Temp 99.1 F (37.3 C) (Oral)   Resp 17   Ht '5\' 9"'$  (1.753 m)   Wt 88.9 kg   LMP  (LMP Unknown)   SpO2 99%   BMI 28.94 kg/m  General:   Alert,  Well-developed, well-nourished, pleasant and cooperative in NAD Neck:  Supple; no masses or thyromegaly. No significant cervical adenopathy. Lungs:  Clear throughout to auscultation.   No wheezes, crackles, or rhonchi. No acute distress. Heart:  Regular rate and rhythm; no murmurs, clicks, rubs,  or gallops. Abdomen: Non-distended, normal bowel sounds.  Soft and nontender without appreciable mass or hepatosplenomegaly.  Pulses:  Normal pulses noted. Extremities:  Without clubbing or edema.  Impression/Plan: 62 year old lady here for average risk screening colonoscopy. The risks, benefits, limitations, alternatives and imponderables have been reviewed with the patient. Questions have been answered. All parties are agreeable.       Notice: This dictation was prepared with Dragon dictation along with smaller phrase technology. Any transcriptional errors that result from this process are unintentional and may not be corrected upon review.

## 2022-12-18 NOTE — Transfer of Care (Signed)
Immediate Anesthesia Transfer of Care Note  Patient: Allison Hart  Procedure(s) Performed: COLONOSCOPY WITH PROPOFOL  Patient Location: PACU  Anesthesia Type:General  Level of Consciousness: awake, alert , and oriented  Airway & Oxygen Therapy: Patient Spontanous Breathing  Post-op Assessment: Report given to RN, Post -op Vital signs reviewed and stable, Patient moving all extremities X 4, and Patient able to stick tongue midline  Post vital signs: Reviewed  Last Vitals:  Vitals Value Taken Time  BP 93/66   Temp 96.9   Pulse 97   Resp 18   SpO2 94     Last Pain:  Vitals:   12/18/22 0735  TempSrc:   PainSc: 0-No pain      Patients Stated Pain Goal: 5 ([id number])  Complications: No notable events documented.

## 2022-12-18 NOTE — Op Note (Signed)
[facility] Patient Name: Allison Hart Procedure Date: 12/18/2022 7:12 AM MRN: [id number] Date of Birth: October 12, 1960 Attending MD: Norvel Richards , MD, [id number] CSN: [id number] Age: 62 Admit Type: Outpatient Procedure:                Colonoscopy Indications:              Screening for colorectal malignant neoplasm Providers:                Norvel Richards, MD, Lambert Mody,                            Kristine L. Risa Grill, Technician Referring MD:             Norvel Richards, MD Medicines:                Propofol per Anesthesia Complications:            No immediate complications. Estimated Blood Loss:     Estimated blood loss: none. Procedure:                Pre-Anesthesia Assessment:                           - Prior to the procedure, a History and Physical                            was performed, and patient medications and                            allergies were reviewed. The patient's tolerance of                            previous anesthesia was also reviewed. The risks                            and benefits of the procedure and the sedation                            options and risks were discussed with the patient.                            All questions were answered, and informed consent                            was obtained. ASA Grade Assessment: II - A patient                            with mild systemic disease. After reviewing the                            risks and benefits, the patient was deemed in                            satisfactory condition to undergo the procedure.  After obtaining informed consent, the colonoscope                            was passed under direct vision. Throughout the                            procedure, the patient's blood pressure, pulse, and                            oxygen saturations were monitored continuously. The                            [id number]) scope  was introduced through the                            anus and advanced to the the cecum, identified by                            appendiceal orifice and ileocecal valve. The                            colonoscopy was performed without difficulty. The                            patient tolerated the procedure well. The quality                            of the bowel preparation was adequate. The                            ileocecal valve, appendiceal orifice, and rectum                            were photographed. Scope In: [id number] Scope Out: 7:48:58 AM Scope Withdrawal Time: 0 hours 7 minutes 48 seconds  Total Procedure Duration: 0 hours 11 minutes 59 seconds  Findings:      The perianal and digital rectal examinations were normal.      Non-bleeding internal hemorrhoids were found during retroflexion. The       hemorrhoids were mild, medium-sized and Grade I (internal hemorrhoids       that do not prolapse).      Multiple medium-mouthed diverticula were found in the entire colon.      The exam was otherwise without abnormality on direct and retroflexion       views. Impression:               - Non-bleeding internal hemorrhoids.                           - Diverticulosis in the entire examined colon.                           - The examination was otherwise normal on direct  and retroflexion views.                           - No specimens collected. Moderate Sedation:      Moderate (conscious) sedation was personally administered by an       anesthesia professional. The following parameters were monitored: oxygen       saturation, heart rate, blood pressure, and response to care. Recommendation:           - Patient has a contact number available for                            emergencies. The signs and symptoms of potential                            delayed complications were discussed with the                            patient. Return to normal  activities tomorrow.                            Written discharge instructions were provided to the                            patient.                           - Resume previous diet.                           - Continue present medications.                           - Repeat colonoscopy in 10 years for screening                            purposes.                           - Return to GI office (date not yet determined). Procedure Code(s):        --- Professional ---                           [phone], Colonoscopy, flexible; diagnostic, including                            collection of specimen(s) by brushing or washing,                            when performed (separate procedure) Diagnosis Code(s):        --- Professional ---                           Z12.11, Encounter for screening for malignant                            neoplasm of colon  K64.0, First degree hemorrhoids                           K57.30, Diverticulosis of large intestine without                            perforation or abscess without bleeding CPT copyright 2022 American Medical Association. All rights reserved. The codes documented in this report are preliminary and upon coder review may  be revised to meet current compliance requirements. Cristopher Estimable. Ellana Kawa, MD Norvel Richards, MD 12/18/2022 8:01:57 AM This report has been signed electronically. Number of Addenda: 0

## 2022-12-27 ENCOUNTER — Encounter (HOSPITAL_COMMUNITY): Payer: Self-pay | Admitting: Internal Medicine

## 2023-01-03 ENCOUNTER — Other Ambulatory Visit: Payer: Self-pay | Admitting: Family Medicine

## 2023-01-17 ENCOUNTER — Other Ambulatory Visit: Payer: Self-pay

## 2023-01-17 DIAGNOSIS — K743 Primary biliary cirrhosis: Secondary | ICD-10-CM

## 2023-01-17 DIAGNOSIS — Z1211 Encounter for screening for malignant neoplasm of colon: Secondary | ICD-10-CM

## 2023-01-26 LAB — GAMMA GT: GGT: 20 IU/L (ref 0–60)

## 2023-01-26 LAB — HEPATIC FUNCTION PANEL
ALT: 28 IU/L (ref 0–32)
AST: 31 IU/L (ref 0–40)
Albumin: 4.7 g/dL (ref 3.8–4.9)
Alkaline Phosphatase: 134 IU/L — ABNORMAL HIGH (ref 44–121)
Bilirubin Total: 0.6 mg/dL (ref 0.0–1.2)
Bilirubin, Direct: 0.17 mg/dL (ref 0.00–0.40)
Total Protein: 8 g/dL (ref 6.0–8.5)

## 2023-01-26 LAB — CBC WITH DIFFERENTIAL/PLATELET
Basophils Absolute: 0 10*3/uL (ref 0.0–0.2)
Basos: 1 %
EOS (ABSOLUTE): 0.1 10*3/uL (ref 0.0–0.4)
Eos: 2 %
Hematocrit: 39.6 % (ref 34.0–46.6)
Hemoglobin: 12.1 g/dL (ref 11.1–15.9)
Immature Grans (Abs): 0 10*3/uL (ref 0.0–0.1)
Immature Granulocytes: 0 %
Lymphocytes Absolute: 0.9 10*3/uL (ref 0.7–3.1)
Lymphs: 30 %
MCH: 28 pg (ref 26.6–33.0)
MCHC: 30.6 g/dL — ABNORMAL LOW (ref 31.5–35.7)
MCV: 92 fL (ref 79–97)
Monocytes Absolute: 0.4 10*3/uL (ref 0.1–0.9)
Monocytes: 14 %
Neutrophils Absolute: 1.6 10*3/uL (ref 1.4–7.0)
Neutrophils: 53 %
Platelets: 218 10*3/uL (ref 150–450)
RBC: 4.32 x10E6/uL (ref 3.77–5.28)
RDW: 12.2 % (ref 11.7–15.4)
WBC: 3 10*3/uL — ABNORMAL LOW (ref 3.4–10.8)

## 2023-01-26 LAB — HEPATITIS B SURFACE ANTIBODY,QUALITATIVE: Hep B Surface Ab, Qual: NONREACTIVE

## 2023-01-26 LAB — HEPATITIS A ANTIBODY, TOTAL: hep A Total Ab: NEGATIVE

## 2023-01-26 LAB — PROTIME-INR
INR: 1 (ref 0.9–1.2)
Prothrombin Time: 11.2 s (ref 9.1–12.0)

## 2023-01-26 LAB — IGG, IGA, IGM
IgA/Immunoglobulin A, Serum: 324 mg/dL (ref 87–352)
IgG (Immunoglobin G), Serum: 1678 mg/dL — ABNORMAL HIGH (ref 586–1602)
IgM (Immunoglobulin M), Srm: 117 mg/dL (ref 26–217)

## 2023-01-26 LAB — VITAMIN A: Vitamin A: 41.3 ug/dL (ref 22.0–69.5)

## 2023-01-30 ENCOUNTER — Other Ambulatory Visit: Payer: Self-pay | Admitting: Allergy & Immunology

## 2023-02-01 ENCOUNTER — Other Ambulatory Visit: Payer: Self-pay | Admitting: Allergy & Immunology

## 2023-02-07 DIAGNOSIS — M25561 Pain in right knee: Secondary | ICD-10-CM | POA: Insufficient documentation

## 2023-02-12 ENCOUNTER — Encounter: Payer: Self-pay | Admitting: Pulmonary Disease

## 2023-02-12 ENCOUNTER — Telehealth: Payer: Self-pay | Admitting: *Deleted

## 2023-02-12 NOTE — Telephone Encounter (Signed)
   Pre-operative Risk Assessment    Patient Name: Allison Hart  DOB: Jul 20, 1961 MRN: [id number]      Request for Surgical Clearance.    Procedure:   RIGHT TOTAL KNEE ARTHROPLASTY  Date of Surgery:  Clearance 04/18/23                                 Surgeon:  DR. Ollen Gross Surgeon's Group or Practice Name:  [facility] Phone number:  [phone] ATTN: Aida Raider Fax number:  [phone]   Type of Clearance Requested:   - Medical ; ASA    Type of Anesthesia:   CHOICE   Additional requests/questions:    Elpidio Anis   02/12/2023, 5:20 PM

## 2023-02-13 NOTE — Telephone Encounter (Signed)
   Name: Allison Hart  DOB: [birth year]  MRN: [id number]  Primary Cardiologist: Lance Muss, MD   Preoperative team, please contact this patient and set up a phone call appointment for further preoperative risk assessment. Please obtain consent and complete medication review. Thank you for your help.  I confirm that guidance regarding antiplatelet and oral anticoagulation therapy has been completed and, if necessary, noted below.  Aspirin prescribed by a noncardiology provider.  Recommendation for holding aspirin deferred to prescribing provider (PCP).     Carlos Levering, NP 02/13/2023, 12:28 PM [facility]

## 2023-02-14 ENCOUNTER — Telehealth: Payer: Self-pay | Admitting: *Deleted

## 2023-02-14 NOTE — Telephone Encounter (Signed)
Left message on machine for pt to contact the office.   

## 2023-02-14 NOTE — Telephone Encounter (Signed)
Left message on machine for pt to contact the office to schedule appt for telephone clearance.

## 2023-02-14 NOTE — Telephone Encounter (Signed)
S/w pt is set up for telephone clearance.     Patient Consent for Virtual Visit         Allison Hart has provided verbal consent on 02/14/2023 for a virtual visit (video or telephone).   CONSENT FOR VIRTUAL VISIT FOR:  Allison Hart  By participating in this virtual visit I agree to the following:  I hereby voluntarily request, consent and authorize [facility] and its employed or contracted physicians, physician assistants, nurse practitioners or other licensed health care professionals (the Practitioner), to provide me with telemedicine health care services (the "Services") as deemed necessary by the treating Practitioner. I acknowledge and consent to receive the Services by the Practitioner via telemedicine. I understand that the telemedicine visit will involve communicating with the Practitioner through live audiovisual communication technology and the disclosure of certain medical information by electronic transmission. I acknowledge that I have been given the opportunity to request an in-person assessment or other available alternative prior to the telemedicine visit and am voluntarily participating in the telemedicine visit.  I understand that I have the right to withhold or withdraw my consent to the use of telemedicine in the course of my care at any time, without affecting my right to future care or treatment, and that the Practitioner or I may terminate the telemedicine visit at any time. I understand that I have the right to inspect all information obtained and/or recorded in the course of the telemedicine visit and may receive copies of available information for a reasonable fee.  I understand that some of the potential risks of receiving the Services via telemedicine include:  Delay or interruption in medical evaluation due to technological equipment failure or disruption; Information transmitted may not be sufficient (e.g. poor resolution of images) to allow for  appropriate medical decision making by the Practitioner; and/or  In rare instances, security protocols could fail, causing a breach of personal health information.  Furthermore, I acknowledge that it is my responsibility to provide information about my medical history, conditions and care that is complete and accurate to the best of my ability. I acknowledge that Practitioner's advice, recommendations, and/or decision may be based on factors not within their control, such as incomplete or inaccurate data provided by me or distortions of diagnostic images or specimens that may result from electronic transmissions. I understand that the practice of medicine is not an exact science and that Practitioner makes no warranties or guarantees regarding treatment outcomes. I acknowledge that a copy of this consent can be made available to me via my patient portal [facility] MyChart), or I can request a printed copy by calling the office of [facility].    I understand that my insurance will be billed for this visit.   I have read or had this consent read to me. I understand the contents of this consent, which adequately explains the benefits and risks of the Services being provided via telemedicine.  I have been provided ample opportunity to ask questions regarding this consent and the Services and have had my questions answered to my satisfaction. I give my informed consent for the services to be provided through the use of telemedicine in my medical care

## 2023-02-14 NOTE — Telephone Encounter (Signed)
Patient is returning call. Requesting call back 

## 2023-02-16 ENCOUNTER — Other Ambulatory Visit: Payer: Self-pay | Admitting: Allergy & Immunology

## 2023-02-19 ENCOUNTER — Other Ambulatory Visit: Payer: Self-pay | Admitting: Family Medicine

## 2023-02-21 ENCOUNTER — Telehealth: Payer: Self-pay | Admitting: Family Medicine

## 2023-02-21 DIAGNOSIS — I10 Essential (primary) hypertension: Secondary | ICD-10-CM

## 2023-02-21 DIAGNOSIS — Z01811 Encounter for preprocedural respiratory examination: Secondary | ICD-10-CM

## 2023-02-21 NOTE — Telephone Encounter (Signed)
Has appt next week and will need CXR and labs no later than this Friday preferably, is pre op assessment I have ordered a CXR, pls order for her to come to lab and get CBC, chem 7and EGFR, CCUA with reflex c/s, can b non fasting so get all done by this Friday please

## 2023-02-22 ENCOUNTER — Other Ambulatory Visit: Payer: Self-pay

## 2023-02-22 ENCOUNTER — Ambulatory Visit (HOSPITAL_COMMUNITY)
Admission: RE | Admit: 2023-02-22 | Discharge: 2023-02-22 | Disposition: A | Discharge status: Home / Self Care | Payer: Commercial Managed Care - PPO | Source: Ambulatory Visit | Attending: Family Medicine | Admitting: Family Medicine

## 2023-02-22 DIAGNOSIS — Z01811 Encounter for preprocedural respiratory examination: Secondary | ICD-10-CM | POA: Diagnosis present

## 2023-02-22 DIAGNOSIS — I10 Essential (primary) hypertension: Secondary | ICD-10-CM | POA: Diagnosis not present

## 2023-02-22 DIAGNOSIS — Z01818 Encounter for other preprocedural examination: Secondary | ICD-10-CM

## 2023-02-22 LAB — CMP14+EGFR
ALT: 20 IU/L (ref 0–32)
AST: 22 IU/L (ref 0–40)
Albumin/Globulin Ratio: 1.3 (ref 1.2–2.2)
Albumin: 4.3 g/dL (ref 3.8–4.9)
Alkaline Phosphatase: 130 IU/L — ABNORMAL HIGH (ref 44–121)
BUN/Creatinine Ratio: 21 (ref 12–28)
BUN: 18 mg/dL (ref 8–27)
Bilirubin Total: 0.4 mg/dL (ref 0.0–1.2)
CO2: 26 mmol/L (ref 20–29)
Calcium: 9.6 mg/dL (ref 8.7–10.3)
Chloride: 105 mmol/L (ref 96–106)
Creatinine, Ser: 0.86 mg/dL (ref 0.57–1.00)
Globulin, Total: 3.2 g/dL (ref 1.5–4.5)
Glucose: 78 mg/dL (ref 70–99)
Potassium: 4.5 mmol/L (ref 3.5–5.2)
Sodium: 144 mmol/L (ref 134–144)
Total Protein: 7.5 g/dL (ref 6.0–8.5)
eGFR: 77 mL/min/{1.73_m2} (ref >59)

## 2023-02-22 LAB — LIPID PANEL
Chol/HDL Ratio: 3.4 ratio (ref 0.0–4.4)
Cholesterol, Total: 205 mg/dL — ABNORMAL HIGH (ref 100–199)
HDL: 60 mg/dL (ref >39)
LDL Chol Calc (NIH): 136 mg/dL — ABNORMAL HIGH (ref 0–99)
Triglycerides: 47 mg/dL (ref 0–149)
VLDL Cholesterol Cal: 9 mg/dL (ref 5–40)

## 2023-02-22 NOTE — Telephone Encounter (Signed)
Patient aware.

## 2023-02-23 LAB — URINALYSIS, ROUTINE W REFLEX MICROSCOPIC
Bilirubin, UA: NEGATIVE
Glucose, UA: NEGATIVE
Ketones, UA: NEGATIVE
Nitrite, UA: NEGATIVE
Protein,UA: NEGATIVE
RBC, UA: NEGATIVE
Specific Gravity, UA: 1.011 (ref 1.005–1.030)
Urobilinogen, Ur: 1 mg/dL (ref 0.2–1.0)
pH, UA: 6.5 (ref 5.0–7.5)

## 2023-02-23 LAB — MICROSCOPIC EXAMINATION
Bacteria, UA: NONE SEEN
Casts: NONE SEEN /lpf
RBC, Urine: NONE SEEN /hpf (ref 0–2)

## 2023-02-27 ENCOUNTER — Encounter: Payer: Self-pay | Admitting: Family Medicine

## 2023-02-27 ENCOUNTER — Ambulatory Visit (INDEPENDENT_AMBULATORY_CARE_PROVIDER_SITE_OTHER): Payer: Commercial Managed Care - PPO | Admitting: Family Medicine

## 2023-02-27 VITALS — BP 120/84 | HR 69 | Ht 69.0 in | Wt 197.0 lb

## 2023-02-27 DIAGNOSIS — E785 Hyperlipidemia, unspecified: Secondary | ICD-10-CM | POA: Diagnosis not present

## 2023-02-27 DIAGNOSIS — I10 Essential (primary) hypertension: Secondary | ICD-10-CM

## 2023-02-27 DIAGNOSIS — K743 Primary biliary cirrhosis: Secondary | ICD-10-CM

## 2023-02-27 DIAGNOSIS — E663 Overweight: Secondary | ICD-10-CM

## 2023-02-27 DIAGNOSIS — Z01818 Encounter for other preprocedural examination: Secondary | ICD-10-CM | POA: Diagnosis not present

## 2023-02-27 NOTE — Patient Instructions (Addendum)
Annual exam with pap mid September, call if you need me sooner  You are medically cleared for upcoming surgery, right knee replacement , message will be sent to Dr Alusio's office  Thanks for choosing [facility], we consider it a privelige to serve you.

## 2023-02-28 ENCOUNTER — Encounter: Payer: Self-pay | Admitting: Family Medicine

## 2023-02-28 DIAGNOSIS — Z01818 Encounter for other preprocedural examination: Secondary | ICD-10-CM | POA: Insufficient documentation

## 2023-02-28 NOTE — Assessment & Plan Note (Signed)
Hyperlipidemia:Low fat diet discussed and encouraged.   Lipid Panel  Lab Results  Component Value Date   CHOL 205 (H) 02/21/2023   HDL 60 02/21/2023   LDLCALC 136 (H) 02/21/2023   TRIG 47 02/21/2023   CHOLHDL 3.4 02/21/2023     Improved nbut needs to achieve target goals, needs to lower fat intake

## 2023-02-28 NOTE — Assessment & Plan Note (Signed)
  Patient re-educated about  the importance of commitment to a  minimum of 150 minutes of exercise per week as able.  The importance of healthy food choices with portion control discussed, as well as eating regularly and within a 12 hour window most days. The need to choose "clean , green" food 50 to 75% of the time is discussed, as well as to make water the primary drink and set a goal of 64 ounces water daily.       02/27/2023    2:43 PM 12/18/2022    6:48 AM 12/06/2022    2:21 PM  Weight /BMI  Weight 197 lb 196 lb 196 lb 9.6 oz  Height 5\' 9"  (1.753 m) 5\' 9"  (1.753 m) 5\' 9"  (1.753 m)  BMI 29.09 kg/m2 28.94 kg/m2 29.03 kg/m2    stable

## 2023-02-28 NOTE — Progress Notes (Addendum)
Allison Hart     MRN: [id number]      DOB: 1961/03/12  Chief Complaint  Patient presents with   Hypertension    Follow up    Pre-op Exam    Right knee replacement planned in early July, cardiac clearance scheduled separately by Ortho    HPI Allison Hart is here for preop evaluation fo proposed right knee replacement Main c/o right knee pain and instablity s   ROS Denies recent fever or chills. Denies sinus pressure, does have chronic nasal congestion, denies ear pain or sore throat. Denies chest congestion, productive cough or wheezing. Denies chest pains, palpitations and leg swelling Denies abdominal pain, nausea, vomiting,diarrhea or constipation.   Denies dysuria, frequency, hesitancy or incontinence. . Denies headaches, seizures, numbness, or tingling. Denies depression, anxiety or insomnia. Denies skin break down or rash.   PE  BP 120/84   Pulse 69   Ht 5\' 9"  (1.753 m)   Wt 197 lb (89.4 kg)   LMP  (LMP Unknown)   SpO2 93%   BMI 29.09 kg/m   Patient alert and oriented and in no cardiopulmonary distress.  HEENT: No facial asymmetry, EOMI,     Neck supple .No sinus tenderness, positive nasal and sinus congestion  Chest: Clear to auscultation bilaterally.decreased air entry  CVS: S1, S2 soft systolic  murmur, no S3.Regular rate.  ABD: Soft non tender.   Ext: No edema  Allison: Adequate ROM spine, shoulders, hips and reduced in right knees.  Skin: Intact, no ulcerations or rash noted.  Psych: Good eye contact, normal affect. Memory intact not anxious or depressed appearing.  CNS: CN 2-12 intact, power,  normal throughout.no focal deficits noted.   Assessment & Plan  Pre-op examination Exam as documented Recent Labs, X ray report and echo reviewed with Allison Hart  Medically cleared, CXR negative for infection CCUA: negative for infection Medically cleared for right knee replacemet Continue current medications  Essential hypertension DASH diet and  commitment to daily physical activity for a minimum of 30 minutes discussed and encouraged, as a part of hypertension management. The importance of attaining a healthy weight is also discussed.     02/27/2023    3:22 PM 02/27/2023    2:43 PM 12/18/2022    7:54 AM 12/18/2022    7:01 AM 12/18/2022    6:48 AM 12/06/2022    2:21 PM 11/17/2022   10:21 AM  BP/Weight  Systolic BP 120 135 93 123  130 130  Diastolic BP 84 87 66 86  72 68  Wt. (Lbs)  197   196 196.6 192.4  BMI  29.09 kg/m2   28.94 kg/m2 29.03 kg/m2 28.41 kg/m2     Controlled, no change in medication   Hyperlipidemia LDL goal <100 Hyperlipidemia:Low fat diet discussed and encouraged.   Lipid Panel  Lab Results  Component Value Date   CHOL 205 (H) 02/21/2023   HDL 60 02/21/2023   LDLCALC 136 (H) 02/21/2023   TRIG 47 02/21/2023   CHOLHDL 3.4 02/21/2023     Improved nbut needs to achieve target goals, needs to lower fat intake   Overweight (BMI 25.0-29.9)  Patient re-educated about  the importance of commitment to a  minimum of 150 minutes of exercise per week as able.  The importance of healthy food choices with portion control discussed, as well as eating regularly and within a 12 hour window most days. The need to choose "clean , green" food 50 to 75% of the time  is discussed, as well as to make water the primary drink and set a goal of 64 ounces water daily.       02/27/2023    2:43 PM 12/18/2022    6:48 AM 12/06/2022    2:21 PM  Weight /BMI  Weight 197 lb 196 lb 196 lb 9.6 oz  Height 5\' 9"  (1.753 m) 5\' 9"  (1.753 m) 5\' 9"  (1.753 m)  BMI 29.09 kg/m2 28.94 kg/m2 29.03 kg/m2    stable  Primary biliary cholangitis (HCC) On ursodiol and treated by Laurette Schimke

## 2023-02-28 NOTE — Assessment & Plan Note (Signed)
On ursodiol and treated by Laurette Schimke

## 2023-02-28 NOTE — Assessment & Plan Note (Signed)
DASH diet and commitment to daily physical activity for a minimum of 30 minutes discussed and encouraged, as a part of hypertension management. The importance of attaining a healthy weight is also discussed.     02/27/2023    3:22 PM 02/27/2023    2:43 PM 12/18/2022    7:54 AM 12/18/2022    7:01 AM 12/18/2022    6:48 AM 12/06/2022    2:21 PM 11/17/2022   10:21 AM  BP/Weight  Systolic BP 120 135 93 123  130 130  Diastolic BP 84 87 66 86  72 68  Wt. (Lbs)  197   196 196.6 192.4  BMI  29.09 kg/m2   28.94 kg/m2 29.03 kg/m2 28.41 kg/m2     Controlled, no change in medication

## 2023-02-28 NOTE — Assessment & Plan Note (Addendum)
Exam as documented Recent Labs, X ray report and echo reviewed with Allison Hart  Medically cleared, CXR negative for infection CCUA: negative for infection Medically cleared for right knee replacemet Continue current medications

## 2023-03-13 ENCOUNTER — Other Ambulatory Visit: Payer: Self-pay | Admitting: Family Medicine

## 2023-03-13 ENCOUNTER — Encounter: Payer: Self-pay | Admitting: Family Medicine

## 2023-03-14 ENCOUNTER — Encounter: Payer: Self-pay | Admitting: Gastroenterology

## 2023-03-26 ENCOUNTER — Ambulatory Visit: Payer: Commercial Managed Care - PPO | Attending: Cardiovascular Disease | Admitting: Nurse Practitioner

## 2023-03-26 DIAGNOSIS — Z01810 Encounter for preprocedural cardiovascular examination: Secondary | ICD-10-CM | POA: Diagnosis not present

## 2023-03-26 NOTE — Progress Notes (Signed)
Virtual Visit via Telephone Note   Because of Allison Hart's co-morbid illnesses, she is at least at moderate risk for complications without adequate follow up.  This format is felt to be most appropriate for this patient at this time.  The patient did not have access to video technology/had technical difficulties with video requiring transitioning to audio format only (telephone).  All issues noted in this document were discussed and addressed.  No physical exam could be performed with this format.  Please refer to the patient's chart for her consent to telehealth for [facility].  Evaluation Performed:  Preoperative cardiovascular risk assessment _____________   Date:  03/26/2023   Patient ID:  Allison Hart, DOB 1960-12-28, MRN [id number] Patient Location:  Home Provider location:   Office  Primary Care Provider:  Kerri Perches, MD Primary Cardiologist:  Lance Muss, MD  Chief Complaint / Patient Profile   62 y.o. y/o female with a h/o aortic atherosclerosis, pulmonary hypertension, hypertension, renal artery stenting, arthritis, and GERD who is pending right total knee arthroplasty on 04/18/2023 with Dr. Ollen Gross of [facility] and presents today for telephonic preoperative cardiovascular risk assessment.  History of Present Illness    Allison Hart is a 62 y.o. female who presents via audio/video conferencing for a telehealth visit today.  Pt was last seen in cardiology clinic on 07/31/2022 by Dr. Eldridge Dace.  At that time Allison Hart was doing well.  The patient is now pending procedure as outlined above. Since her last visit, she has done well from a cardiac standpoint.   She denies chest pain, palpitations, dyspnea, pnd, orthopnea, n, v, dizziness, syncope, edema, weight gain, or early satiety. All other systems reviewed and are otherwise negative except as noted above.   Past Medical History    Past Medical History:  Diagnosis Date    Anxiety    Arthritis    Essential hypertension, benign    GERD (gastroesophageal reflux disease)    Herpes    History of kidney stones    Murmur    Obesity    Primary biliary cholangitis (HCC)    Sinusitis    Sleep apnea    Urinary frequency    Past Surgical History:  Procedure Laterality Date   BIOPSY  08/29/2018   Procedure: BIOPSY;  Surgeon: Corbin Ade, MD;  [facility];  Service: Endoscopy;;  gastric   COLONOSCOPY  11/13/2012   Procedure: COLONOSCOPY;  Surgeon: Malissa Hippo, MD;  Location: [facility];  Service: Endoscopy;  Laterality: N/A;  930   COLONOSCOPY WITH PROPOFOL N/A 12/18/2022   Procedure: COLONOSCOPY WITH PROPOFOL;  Surgeon: Corbin Ade, MD;  Location: [facility];  Service: Endoscopy;  Laterality: N/A;  7:30 am   embolization of uterine artery X 2     ESOPHAGOGASTRODUODENOSCOPY (EGD) WITH PROPOFOL N/A 08/29/2018   Procedure: ESOPHAGOGASTRODUODENOSCOPY (EGD) WITH PROPOFOL;  Surgeon: Corbin Ade, MD;  [facility];  Service: Endoscopy;  Laterality: N/A;  3:00pm   INGUINAL LYMPH NODE BIOPSY Right 04/14/2016   Procedure: RIGHT INGUINAL LYMPH NODE BIOPSY;  Surgeon: Franky Macho, MD;  Location: [facility];  Service: General;  Laterality: Right;   IR ANGIO INTRA EXTRACRAN SEL COM CAROTID INNOMINATE BILAT MOD SED  02/12/2017   IR ANGIO VERTEBRAL SEL VERTEBRAL BILAT MOD SED  02/12/2017   IR GENERIC HISTORICAL  04/27/2016   IR RADIOLOGIST EVAL & MGMT 04/27/2016 Malachy Moan, MD [facility] RAD  IR GENERIC HISTORICAL  12/05/2016   IR RADIOLOGIST EVAL & MGMT 12/05/2016 Malachy Moan, MD [facility] INTER[facility]   IR RADIOLOGIST EVAL & MGMT  01/22/2017   IR RADIOLOGIST EVAL & MGMT  08/06/2018   IR RADIOLOGIST EVAL & MGMT  08/19/2020   IR RADIOLOGIST EVAL & MGMT  09/07/2021   Left inguinal hernia repair and endometriosis  2007   No definitive records    Left parotid bx benign  2005   RADIOLOGY WITH ANESTHESIA N/A 01/18/2016   Procedure: RADIOLOGY  WITH ANESTHESIA;  Surgeon: Julieanne Cotton, MD;  Location: [facility];  Service: Radiology;  Laterality: N/A;   RADIOLOGY WITH ANESTHESIA N/A 02/12/2017   Procedure: Nigel Sloop EMBOLIZATION;  Surgeon: Julieanne Cotton, MD;  Location: [facility] OR;  Service: Radiology;  Laterality: N/A;   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 Sulfonamide Containing Compounds Itching   Sulfa Antibiotics Other (See Comments)    Unknown    Home Medications    Prior to Admission medications   Medication Sig Start Date End Date Taking? Authorizing Provider  acyclovir (ZOVIRAX) 400 MG tablet Take 1 tablet (400 mg total) by mouth 2 (two) times daily. Patient taking differently: Take 400 mg by mouth daily as needed. 07/22/22   Kerri Perches, MD  albuterol (VENTOLIN HFA) 108 (90 Base) MCG/ACT inhaler INHALE 2 PUFFS BY MOUTH EVERY 6 HOURS AS NEEDED FOR SHORTNESS OF BREATH OR  WHEEZING 01/30/23   Alfonse Spruce, MD  amlodipine-olmesartan [facility]) 10-20 MG tablet Take 1 tablet by mouth once daily 01/04/23   Kerri Perches, MD  aspirin EC 81 MG tablet Take 1 tablet (81 mg total) by mouth daily. 07/30/18   Kerri Perches, MD  Azelastine HCl 137 MCG/SPRAY SOLN USE 2 SPRAY(S) IN EACH NOSTRIL TWICE DAILY AS DIRECTED 02/19/23   Kerri Perches, MD  cholecalciferol (VITAMIN D) 1000 units tablet Take 1,000 Units by mouth daily.    [provider]  Cyanocobalamin (VITAMIN B-12 PO) Take 1 tablet by mouth daily.    [provider]  furosemide (LASIX) 40 MG tablet Take 1 tablet by mouth once daily 01/04/23   Kerri Perches, MD  Lidocaine 5 % CREA Knee pain/and right hip pain 02/09/23   [provider]  montelukast (SINGULAIR) 10 MG tablet TAKE 1 TABLET BY MOUTH AT  BEDTIME 01/04/23   Kerri Perches, MD  Omega-3 Fatty Acids (OMEGA 3 PO) Take 1 tablet by mouth every other day.    [provider]  potassium chloride SA (KLOR-CON M20) 20 MEQ tablet Take 1 tablet by mouth twice daily 03/14/23   Kerri Perches, MD  Pyridoxine HCl (VITAMIN B-6 PO) Take 1 tablet by mouth daily.    [provider]  SODIUM FLUORIDE 5000 PPM 1.1 % PSTE Use as directed 1 application  in the mouth or throat daily. 07/27/22   [provider]  spironolactone (ALDACTONE) 25 MG tablet Take 1 tablet by mouth once daily 01/04/23   Kerri Perches, MD  TRELEGY ELLIPTA 100-62.5-25 MCG/ACT AEPB Inhale 1 puff by mouth once daily 02/16/23   Ambs, Norvel Richards, FNP  trolamine salicylate (ASPERCREME) 10 % cream Apply 1 Application topically as needed (Knee pain).  [provider]  ursodiol (ACTIGALL) 300 MG capsule Take 2 capsules (600 mg total) by mouth 2 (two) times daily with a meal. 09/13/22   Tiffany Kocher, PA-C  XHANCE 93 MCG/ACT EXHU USE 1 SPRAY IN EACH NOSTRIL TWICE DAILY AS DIRECTED AS NEEDED FOR STUFFY NOSE 02/01/23   Ambs, Norvel Richards, FNP    Physical Exam    Vital Signs:  Allison Hart does not have vital signs available for review today.  Given telephonic nature of communication, physical exam is limited. AAOx3. NAD. Normal affect.  Speech and respirations are unlabored.  Accessory Clinical Findings    None  Assessment & Plan    1.  Preoperative Cardiovascular Risk Assessment:  According to the Revised Cardiac Risk Index (RCRI), her Perioperative Risk of Major Cardiac Event is (%): 0.4. Her Functional Capacity in METs is: 6.45 according to the Duke Activity Status Index (DASI). Therefore, based on ACC/AHA guidelines, patient would be at acceptable risk for the planned procedure without further cardiovascular testing.   The patient was advised that if she develops new symptoms prior to surgery to contact our office to arrange for a  follow-up visit, and she verbalized understanding.  Aspirin prescribed by a noncardiology provider. Recommendation for holding aspirin deferred to prescribing provider (PCP).    A copy of this note will be routed to requesting surgeon.  Time:   Today, I have spent 5 minutes with the patient with telehealth technology discussing medical history, symptoms, and management plan.     Joylene Grapes, NP  03/26/2023, 2:29 PM

## 2023-04-13 ENCOUNTER — Other Ambulatory Visit: Payer: Self-pay | Admitting: Family Medicine

## 2023-04-15 ENCOUNTER — Other Ambulatory Visit: Payer: Self-pay | Admitting: Family Medicine

## 2023-04-20 ENCOUNTER — Telehealth (INDEPENDENT_AMBULATORY_CARE_PROVIDER_SITE_OTHER): Payer: Commercial Managed Care - PPO | Admitting: Pulmonary Disease

## 2023-04-20 ENCOUNTER — Encounter: Payer: Self-pay | Admitting: Pulmonary Disease

## 2023-04-20 DIAGNOSIS — G4733 Obstructive sleep apnea (adult) (pediatric): Secondary | ICD-10-CM

## 2023-04-20 NOTE — Progress Notes (Signed)
[facility]  Chief Complaint  Patient presents with   Follow-up    Doing well with CPAP and no new co's.     Past Surgical History:  She  has a past surgical history that includes Umbilical hernia repair (infancy ); Left inguinal hernia repair and endometriosis (2007); Left parotid bx benign (2005); embolization of uterine artery X 2; Colonoscopy (11/13/2012); Radiology with anesthesia (N/A, 01/18/2016); renal artery aneurysm repair right (Right, 01/2016); Inguinal lymph node biopsy (Right, 04/14/2016); ir generic historical (04/27/2016); ir generic historical (12/05/2016); IR Radiologist Eval & Mgmt (01/22/2017); IR ANGIO VERTEBRAL SEL VERTEBRAL BILAT MOD SED (02/12/2017); IR ANGIO INTRA EXTRACRAN SEL COM CAROTID INNOMINATE BILAT MOD SED (02/12/2017); Radiology with anesthesia (N/A, 02/12/2017); IR Radiologist Eval & Mgmt (08/06/2018); Esophagogastroduodenoscopy (egd) with propofol (N/A, 08/29/2018); biopsy (08/29/2018); IR Radiologist Eval & Mgmt (08/19/2020); IR Radiologist Eval & Mgmt (09/07/2021); and Colonoscopy with propofol (N/A, 12/18/2022).  Past Medical History:  Anxiety, Arthritis, HTN, GERD, Nephrolithiasis, Rt renal artery aneurysm, Allergies  Constitutional:  LMP  (LMP Unknown)   Brief Summary:  Allison Hart is a 62 y.o. female former smoker with obstructive sleep apnea.      Subjective:  Virtual Visit via Video Note  I connected with Allison Hart on 04/20/23 at  4:00 PM EDT by a video enabled telemedicine application and verified that I am speaking with the correct person using two identifiers.  Location: Patient: home Provider: medical office   I discussed the limitations of evaluation and management by telemedicine and the availability of in person appointments. The patient expressed understanding and agreed to proceed.  History of Present Illness:  She uses CPAP nightly.  Sleeping much better.  Has been getting dry nose and  some nose bleeds.  Has a hybrid mask.   Physical Exam:  Alert, no distress, speaking in full sentences.   Sleep Tests:  HST 09/30/22 >> AHI 9.7, SpO2 low 63%.  Auto CPAP 03/22/23 to 04/20/23 >> used on 30 of 30 nights with average 6 hrs 46 min.  Average AHI 0.6 with median CPAP 11 and 95 th percentile CPAP 12 cm H2O  Cardiac Tests:  Echo 03/28/17 >> EF 55 to 60%, PAS 46 mmHg  Social History:  She  reports that she quit smoking about 27 years ago. Her smoking use included cigarettes. She started smoking about 32 years ago. She has a 1.3 pack-year smoking history. She has never used smokeless tobacco. She reports that she does not currently use alcohol. She reports that she does not use drugs.  Family History:  Her family history includes Asthma in her maternal grandfather, niece, and sister; Coronary artery disease in her mother; Fibroids in her sister; Hyperlipidemia in her mother; Hypertension in her mother and sister; Irregular heart beat in her mother; Thyroid disease in her mother; Ulcerative colitis in her son.     Assessment/Plan:   Obstructive sleep apnea. - she is compliant with CPAP and reports benefit from therapy - she uses [employer] for her DME - current CPAP ordered February 2024 - continue auto CPAP 5 to 15 cm H2O - she can increase humidity with CPAP and try nasal saline gel to help with dryness and nose bleeds  Hx of TMJ, Bruxism. - follow up with dentist  Allergic asthma. - followed by Dr. Malachi Bonds with Allergy and Ast[facility]  Time Spent Involved in Patient Care on Day of Examination:   I discussed the assessment and treatment plan with  the patient. The patient was provided an opportunity to ask questions and all were answered. The patient agreed with the plan and demonstrated an understanding of the instructions.   The patient was advised to call back or seek an in-person evaluation if the symptoms worsen or if the condition fails to improve as anticipated.  I  provided 15 minutes of non-face-to-face time during this encounter.  Follow up:   Patient Instructions  Try increasing the humidifier temperature setting with your CPAP machine to see if this helps with dryness and nose bleeds.  You can also try Ayr saline nasal gel.  Follow up in 1 year.  Medication List:   Allergies as of 04/20/2023       Reactions   Versed [midazolam] Shortness Of Breath, Other (See Comments)   Sweating, anxious and "I felt like I was going to pass out"   Misc. Sulfonamide Containing Compounds Itching   Sulfa Antibiotics Other (See Comments)   Unknown        Medication List        Accurate as of April 20, 2023  4:42 PM. If you have any questions, ask your nurse or doctor.          acyclovir 400 MG tablet Commonly known as: ZOVIRAX Take 1 tablet (400 mg total) by mouth 2 (two) times daily. What changed:  when to take this reasons to take this   albuterol 108 (90 Base) MCG/ACT inhaler Commonly known as: VENTOLIN HFA INHALE 2 PUFFS BY MOUTH EVERY 6 HOURS AS NEEDED FOR SHORTNESS OF BREATH OR  WHEEZING   amlodipine-olmesartan 10-20 MG tablet Commonly known as: AZOR Take 1 tablet by mouth once daily   aspirin EC 81 MG tablet Take 1 tablet (81 mg total) by mouth daily.   Azelastine HCl 137 MCG/SPRAY Soln USE 2 SPRAY(S) IN EACH NOSTRIL TWICE DAILY AS DIRECTED   cholecalciferol 25 MCG (1000 UNIT) tablet Commonly known as: VITAMIN D3 Take 1,000 Units by mouth daily.   furosemide 40 MG tablet Commonly known as: LASIX Take 1 tablet by mouth once daily   Lidocaine 5 % Crea Knee pain/and right hip pain   montelukast 10 MG tablet Commonly known as: SINGULAIR TAKE 1 TABLET BY MOUTH AT BEDTIME   OMEGA 3 PO Take 1 tablet by mouth every other day.   potassium chloride SA 20 MEQ tablet Commonly known as: Klor-Con M20 Take 1 tablet by mouth twice daily   Sodium Fluoride 5000 PPM 1.1 % Pste Generic drug: Sodium Fluoride Use as directed 1  application  in the mouth or throat daily.   spironolactone 25 MG tablet Commonly known as: ALDACTONE Take 1 tablet by mouth once daily   Trelegy Ellipta 100-62.5-25 MCG/ACT Aepb Generic drug: Fluticasone-Umeclidin-Vilant Inhale 1 puff by mouth once daily   trolamine salicylate 10 % cream Commonly known as: ASPERCREME Apply 1 Application topically as needed (Knee pain).   ursodiol 300 MG capsule Commonly known as: ACTIGALL Take 2 capsules (600 mg total) by mouth 2 (two) times daily with a meal.   VITAMIN B-12 PO Take 1 tablet by mouth daily.   VITAMIN B-6 PO Take 1 tablet by mouth daily.   Xhance 93 MCG/ACT Exhu Generic drug: Fluticasone Propionate USE 1 SPRAY IN EACH NOSTRIL TWICE DAILY AS DIRECTED AS NEEDED FOR STUFFY NOSE        Signature:  Coralyn Helling, MD [facility] Pager - [phone] 04/20/2023, 4:42 PM

## 2023-04-20 NOTE — Patient Instructions (Signed)
Try increasing the humidifier temperature setting with your CPAP machine to see if this helps with dryness and nose bleeds.  You can also try Ayr saline nasal gel.  Follow up in 1 year.

## 2023-04-24 ENCOUNTER — Other Ambulatory Visit: Payer: Self-pay | Admitting: Family Medicine

## 2023-05-30 ENCOUNTER — Other Ambulatory Visit: Payer: Self-pay | Admitting: Family Medicine

## 2023-06-01 ENCOUNTER — Encounter: Payer: Self-pay | Admitting: Family Medicine

## 2023-06-05 ENCOUNTER — Other Ambulatory Visit: Payer: Self-pay | Admitting: Family Medicine

## 2023-06-05 NOTE — Progress Notes (Signed)
[address] [place] [address] Dept: [phone]  FOLLOW UP NOTE  Patient ID: Allison Hart, female    DOB: 06/12/1961  Age: 62 y.o. MRN: [id number] Date of Office Visit: 06/06/2023  Assessment  Chief Complaint: Follow-up  HPI Allison Hart is a 62 year old female who presents to the clinic for follow-up visit.  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  At today's visit, she reports her asthma has been well-controlled with no shortness of breath, cough, or wheeze with activity or rest.  She continues Trelegy 100-1 puff once a day and rarely uses albuterol for relief of symptoms.  Allergic rhinitis has been moderately well-controlled with occasional clear rhinorrhea and nasal congestion.  She continues montelukast, Xhance, azelastine, and saline nasal rinses.  Her last environmental allergy testing was on 07/31/2022 was positive to mold, dust mite, and cockroach.   She mostly continues to avoid pecans, however, she does report that a few weeks ago she had ice cream with pecans as she wanted to test her pecan allergy.  After consuming the ice cream containing pecans she began to develop throat irritation and copious rhinorrhea, postnasal drainage, and nasal congestion.  Interestingly, she eats walnuts with no problem.  Her epinephrine autoinjector set is up-to-date.  Her last food allergy testing was on 07/31/2022 was negative to pecan.  Subsequent lab testing was negative to pecan on 08/02/2022.  She is not interested in having an EpiPen at this time.  She reports that she will call our clinic if she is interested in having an EpiPen.  Her current medications are listed in the chart.  Drug Allergies:  Allergies  Allergen Reactions   Versed [Midazolam] Shortness Of Breath and Other (See Comments)    Sweating, anxious and "I felt like I was going to pass out"   Misc.  Sulfonamide Containing Compounds Itching   Sulfa Antibiotics Other (See Comments)    Unknown    Physical Exam: BP 118/80   Pulse 75   Temp 98.1 F (36.7 C)   Resp 16   Wt 193 lb (87.5 kg)   LMP  (LMP Unknown)   SpO2 98%   BMI 28.50 kg/m    Physical Exam Vitals reviewed.  Constitutional:      Appearance: Normal appearance.  HENT:     Head: Normocephalic and atraumatic.     Right Ear: Tympanic membrane normal.     Left Ear: Tympanic membrane normal.     Nose:     Comments: Bilateral nares slightly erythematous with thin clear nasal drainage noted.  Pharynx normal.  Ears normal.  Eyes normal.    Mouth/Throat:     Pharynx: Oropharynx is clear.  Eyes:     Conjunctiva/sclera: Conjunctivae normal.  Cardiovascular:     Rate and Rhythm: Normal rate and regular rhythm.     Heart sounds: Normal heart sounds. No murmur heard. Pulmonary:     Effort: Pulmonary effort is normal.     Breath sounds: Normal breath sounds.     Comments: Lungs clear to auscultation Musculoskeletal:        General: Normal range of motion.     Cervical back: Normal range of motion and neck supple.  Skin:    General: Skin is warm and dry.  Neurological:     Mental Status: She is alert and oriented to person, place, and time.  Psychiatric:  Mood and Affect: Mood normal.        Behavior: Behavior normal.        Thought Content: Thought content normal.        Judgment: Judgment normal.     Diagnostics: FVC 2.32 which is 72% of predicted value, FEV1 1.51 which is 60% of predicted value.  Spirometry indicates possible obstruction.  Postbronchodilator FVC 2.03, FEV1 1.60.  Postbronchodilator spirometry with no significant improvement in FEV1.  Possible restriction noted.  This is consistent with previous spirometry readings.  Assessment and Plan: 1. Shortness of breath   2. Seasonal and perennial allergic rhinitis   3. Anaphylactic shock due to food, subsequent encounter     Meds ordered this  encounter  Medications   Fluticasone-Umeclidin-Vilant (TRELEGY ELLIPTA) 100-62.5-25 MCG/ACT AEPB    Sig: Inhale 1 puff into the lungs daily.    Dispense:  1 each    Refill:  5    Patient Instructions  Allergic rhinitis Continue allergen avoidance measures directed toward mold, dust mite, and cockroach as listed below Continue Claritin 10 mg once a day as needed for runny nose or itch Continue montelukast 10 mg once a day to help control allergy symptoms Continue azelastine 2 sprays in each nostril twice a day as needed for runny nose or nasal symptoms Continue Xhance 1 spray in each nostril twice a day as needed for stuffy nose Consider saline nasal rinses as needed for nasal symptoms. Use this before any medicated nasal sprays for best result If your symptoms are not well controlled with the treatment plan as listed above, consider allergen immunotherapy  Shortness of breath Continue Trelegy 100-1 puff once a day to prevent cough or wheeze Continue montelukast 10 mg once a day to prevent cough or wheeze Continue albuterol 2 puffs once every 4 hours as needed for cough or wheeze You may use albuterol 2 puffs 5 to 15 minutes before activity to decrease cough or wheeze  Food allergy Your skin testing was negative to pecan and lab testing was negative to pecan. In case of an allergic reaction, take Benadryl 50 mg every 4 hours, and if life-threatening symptoms occur, inject with EpiPen 0.3 mg. Call the clinic and schedule an in office oral food challenge to pecan if interested.  Remember to stop antihistamines for 3 days before the test.  Call the clinic if this treatment plan is not working well for you  Follow up in 6 months or sooner if needed.   Return in about 6 months (around 12/07/2023), or if symptoms worsen or fail to improve.    Thank you for the opportunity to care for this patient.  Please do not hesitate to contact me with questions.  Thermon Leyland, FNP [facility]

## 2023-06-05 NOTE — Patient Instructions (Signed)
Allergic rhinitis Continue allergen avoidance measures directed toward mold, dust mite, and cockroach as listed below Continue Claritin 10 mg once a day as needed for runny nose or itch Continue montelukast 10 mg once a day to help control allergy symptoms Continue azelastine 2 sprays in each nostril twice a day as needed for runny nose or nasal symptoms Continue Xhance 1 spray in each nostril twice a day as needed for stuffy nose Consider saline nasal rinses as needed for nasal symptoms. Use this before any medicated nasal sprays for best result If your symptoms are not well controlled with the treatment plan as listed above, consider allergen immunotherapy  Shortness of breath Continue Trelegy 100-1 puff once a day to prevent cough or wheeze Continue montelukast 10 mg once a day to prevent cough or wheeze Continue albuterol 2 puffs once every 4 hours as needed for cough or wheeze You may use albuterol 2 puffs 5 to 15 minutes before activity to decrease cough or wheeze  Food allergy Your skin testing was negative to pecan and lab testing was negative to pecan. In case of an allergic reaction, take Benadryl 50 mg every 4 hours, and if life-threatening symptoms occur, inject with EpiPen 0.3 mg. Call the clinic and schedule an in office oral food challenge to pecan if interested.  Remember to stop antihistamines for 3 days before the test.  Call the clinic if this treatment plan is not working well for you  Follow up in 6 months or sooner if needed.  Control of Mold Allergen Mold and fungi can grow on a variety of surfaces provided certain temperature and moisture conditions exist.  Outdoor molds grow on plants, decaying vegetation and soil.  The major outdoor mold, Alternaria and Cladosporium, are found in very high numbers during hot and dry conditions.  Generally, a late Summer - Fall peak is seen for common outdoor fungal spores.  Rain will temporarily lower outdoor mold spore count, but  counts rise rapidly when the rainy period ends.  The most important indoor molds are Aspergillus and Penicillium.  Dark, humid and poorly ventilated basements are ideal sites for mold growth.  The next most common sites of mold growth are the bathroom and the kitchen.  Outdoor [employer] Use air conditioning and keep windows closed Avoid exposure to decaying vegetation. Avoid leaf raking. Avoid grain handling. Consider wearing a face mask if working in moldy areas.  Indoor Mold Control Maintain humidity below 50%. Clean washable surfaces with 5% bleach solution. Remove sources e.g. Contaminated carpets.  Control of Cockroach Allergen Cockroach allergen has been identified as an important cause of acute attacks of asthma, especially in urban settings.  There are fifty-five species of cockroach that exist in the [place], however only three, the [place], [place] species produce allergen that can affect patients with Asthma.  Allergens can be obtained from fecal particles, egg casings and secretions from cockroaches.    Remove food sources. Reduce access to water. Seal access and entry points. Spray runways with 0.5-1% Diazinon or Chlorpyrifos Blow boric acid power under stoves and refrigerator. Place bait stations (hydramethylnon) at feeding sites.    Control of Dust Mite Allergen Dust mites play a major role in allergic asthma and rhinitis. They occur in environments with high humidity wherever human skin is found. Dust mites absorb humidity from the atmosphere (ie, they do not drink) and feed on organic matter (including shed human and animal skin). Dust mites are a microscopic type of  insect that you cannot see with the naked eye. High levels of dust mites have been detected from mattresses, pillows, carpets, upholstered furniture, bed covers, clothes, soft toys and any woven material. The principal allergen of the dust mite is found in its feces. A gram of dust may  contain 1,000 mites and 250,000 fecal particles. Mite antigen is easily measured in the air during house cleaning activities. Dust mites do not bite and do not cause harm to humans, other than by triggering allergies/asthma.  Ways to decrease your exposure to dust mites in your home:  1. Encase mattresses, box springs and pillows with a mite-impermeable barrier or cover  2. Wash sheets, blankets and drapes weekly in hot water (130 F) with detergent and dry them in a dryer on the hot setting.  3. Have the room cleaned frequently with a vacuum cleaner and a damp dust-mop. For carpeting or rugs, vacuuming with a vacuum cleaner equipped with a high-efficiency particulate air (HEPA) filter. The dust mite allergic individual should not be in a room which is being cleaned and should wait 1 hour after cleaning before going into the room.  4. Do not sleep on upholstered furniture (eg, couches).  5. If possible removing carpeting, upholstered furniture and drapery from the home is ideal. Horizontal blinds should be eliminated in the rooms where the person spends the most time (bedroom, study, television room). Washable vinyl, roller-type shades are optimal.  6. Remove all non-washable stuffed toys from the bedroom. Wash stuffed toys weekly like sheets and blankets above.  7. Reduce indoor humidity to less than 50%. Inexpensive humidity monitors can be purchased at most hardware stores. Do not use a humidifier as can make the problem worse and are not recommended.  Follow-up

## 2023-06-06 ENCOUNTER — Encounter: Payer: Self-pay | Admitting: Family Medicine

## 2023-06-06 ENCOUNTER — Ambulatory Visit (INDEPENDENT_AMBULATORY_CARE_PROVIDER_SITE_OTHER): Payer: Commercial Managed Care - PPO | Admitting: Family Medicine

## 2023-06-06 VITALS — BP 118/80 | HR 75 | Temp 98.1°F | Resp 16 | Wt 193.0 lb

## 2023-06-06 DIAGNOSIS — T7800XD Anaphylactic reaction due to unspecified food, subsequent encounter: Secondary | ICD-10-CM | POA: Diagnosis not present

## 2023-06-06 DIAGNOSIS — J3089 Other allergic rhinitis: Secondary | ICD-10-CM | POA: Diagnosis not present

## 2023-06-06 DIAGNOSIS — J302 Other seasonal allergic rhinitis: Secondary | ICD-10-CM | POA: Diagnosis not present

## 2023-06-06 DIAGNOSIS — R0602 Shortness of breath: Secondary | ICD-10-CM

## 2023-06-06 MED ORDER — TRELEGY ELLIPTA 100-62.5-25 MCG/ACT IN AEPB: 1.0000 | INHALATION_SPRAY | Freq: Every day | RESPIRATORY_TRACT | 5 refills | Status: DC

## 2023-06-07 ENCOUNTER — Encounter: Payer: Self-pay | Admitting: Family Medicine

## 2023-06-12 ENCOUNTER — Other Ambulatory Visit: Payer: Self-pay

## 2023-06-12 DIAGNOSIS — I10 Essential (primary) hypertension: Secondary | ICD-10-CM

## 2023-06-21 ENCOUNTER — Encounter: Payer: Self-pay | Admitting: Family Medicine

## 2023-06-28 ENCOUNTER — Ambulatory Visit (INDEPENDENT_AMBULATORY_CARE_PROVIDER_SITE_OTHER): Payer: Commercial Managed Care - PPO | Admitting: Family Medicine

## 2023-06-28 ENCOUNTER — Encounter: Payer: Self-pay | Admitting: Family Medicine

## 2023-06-28 ENCOUNTER — Telehealth: Payer: Self-pay | Admitting: Gastroenterology

## 2023-06-28 VITALS — BP 131/88 | HR 75 | Ht 69.0 in | Wt 196.0 lb

## 2023-06-28 DIAGNOSIS — I071 Rheumatic tricuspid insufficiency: Secondary | ICD-10-CM

## 2023-06-28 DIAGNOSIS — I10 Essential (primary) hypertension: Secondary | ICD-10-CM

## 2023-06-28 DIAGNOSIS — Z1231 Encounter for screening mammogram for malignant neoplasm of breast: Secondary | ICD-10-CM

## 2023-06-28 DIAGNOSIS — Z0000 Encounter for general adult medical examination without abnormal findings: Secondary | ICD-10-CM | POA: Diagnosis not present

## 2023-06-28 DIAGNOSIS — K224 Dyskinesia of esophagus: Secondary | ICD-10-CM

## 2023-06-28 DIAGNOSIS — E785 Hyperlipidemia, unspecified: Secondary | ICD-10-CM | POA: Diagnosis not present

## 2023-06-28 DIAGNOSIS — E559 Vitamin D deficiency, unspecified: Secondary | ICD-10-CM

## 2023-06-28 MED ORDER — OMEPRAZOLE 40 MG PO CPDR: 40.0000 mg | DELAYED_RELEASE_CAPSULE | Freq: Every day | ORAL | 3 refills | Status: DC

## 2023-06-28 NOTE — Patient Instructions (Addendum)
F/U in 4 months, call if you need me sooner  Pls schedule mammogram at [facility] at checkout   Fasting CBC, lipid, cmp and eGFr, TSH and vit D 1 week before January appointment please  Please get your flu vaccine, the RSV vaccine and the current covid vaccine. I recommend spacing them out say one per week  You need to schedule f/u with GI , stop by the office , they expected you in July  Thankful that right knee replacement has gone  very well  Resume omeprazole, and I have referred you to GI re spasm when drinking liquids intermittently in past  9 months  It is important that you exercise regularly at least 30 minutes 5 times a week. If you develop chest pain, have severe difficulty breathing, or feel very tired, stop exercising immediately and seek medical attention   Think about what you will eat, plan ahead. Choose " clean, green, fresh or frozen" over canned, processed or packaged foods which are more sugary, salty and fatty. 70 to 75% of food eaten should be vegetables and fruit. Three meals at set times with snacks allowed between meals, but they must be fruit or vegetables. Aim to eat over a 12 hour period , example 7 am to 7 pm, and STOP after  your last meal of the day. Drink water,generally about 64 ounces per day, no other drink is as healthy. Fruit juice is best enjoyed in a healthy way, by EATING the fruit. Thanks for choosing [facility], we consider it a privelige to serve you.

## 2023-06-28 NOTE — Progress Notes (Signed)
Allison Hart     MRN: [id number]      DOB: 06-02-1961  Chief Complaint  Patient presents with   Annual Exam    CPE  had knee replaced    HPI: Patient is in for annual physical exam. No other health concerns are expressed or addressed at the visit. Recent labs,  are reviewed. Immunization is reviewed , and  updated if needed.   PE: BP 131/88 (BP Location: Right Arm, Patient Position: Sitting, Cuff Size: Large)   Pulse 75   Ht 5\' 9"  (1.753 m)   Wt 196 lb 0.6 oz (88.9 kg)   LMP  (LMP Unknown)   SpO2 95%   BMI 28.95 kg/m   Pleasant  female, alert and oriented x 3, in no cardio-pulmonary distress. Afebrile. HEENT No facial trauma or asymetry. .  Extra occullar muscles intact.. External ears normal, . Neck: supple  Chest: Clear to ascultation bilaterally.No crackles or wheezes. Non tender to palpation    Cardiovascular system; Heart sounds normal,  S1 and  S2 ,no S3.  Systolic  murmur, no thrill. Apical beat not displaced Peripheral pulses normal.  Abdomen: Soft, non tender     Musculoskeletal exam: Full ROM of spine, hips , shoulders and knees. No deformity ,swelling or crepitus noted. No muscle wasting or atrophy.   Neurologic: Cranial nerves 2 to 12 intact. Power, tone ,sensation and reflexes normal throughout. No disturbance in gait. No tremor.  Skin: Intact, no ulceration, erythema , scaling or rash noted. Pigmentation normal throughout  Psych; Normal mood and affect. Judgement and concentration normal   Assessment & Plan:  Encounter for annual physical exam Annual exam as documented. Counseling done  re healthy lifestyle involving commitment to 150 minutes exercise per week, heart healthy diet, and attaining healthy weight.The importance of adequate sleep also discussed. Immunization and cancer screening needs are specifically addressed at this visit.   Esophageal spasm Has had recurrent episodes of esophageal spasm with  clear  liquid, infrequent , but frightening, refer GI for eval

## 2023-06-28 NOTE — Telephone Encounter (Signed)
Patient has been scheduled for an appt in Oct. 2 with Allison Hart.  She asked if she would need to get blood work before that appt.  Please advise.

## 2023-06-28 NOTE — Telephone Encounter (Signed)
Any needed labs will be ordered at the time of her ov

## 2023-07-02 ENCOUNTER — Encounter: Payer: Self-pay | Admitting: Family Medicine

## 2023-07-02 ENCOUNTER — Telehealth: Payer: Self-pay

## 2023-07-02 ENCOUNTER — Other Ambulatory Visit (HOSPITAL_COMMUNITY): Payer: Self-pay

## 2023-07-02 ENCOUNTER — Other Ambulatory Visit: Payer: Self-pay | Admitting: Family Medicine

## 2023-07-02 DIAGNOSIS — K224 Dyskinesia of esophagus: Secondary | ICD-10-CM | POA: Insufficient documentation

## 2023-07-02 DIAGNOSIS — I071 Rheumatic tricuspid insufficiency: Secondary | ICD-10-CM | POA: Insufficient documentation

## 2023-07-02 DIAGNOSIS — K743 Primary biliary cirrhosis: Secondary | ICD-10-CM

## 2023-07-02 DIAGNOSIS — R7989 Other specified abnormal findings of blood chemistry: Secondary | ICD-10-CM

## 2023-07-02 DIAGNOSIS — Z0000 Encounter for general adult medical examination without abnormal findings: Secondary | ICD-10-CM | POA: Insufficient documentation

## 2023-07-02 NOTE — Assessment & Plan Note (Signed)
Has had recurrent episodes of esophageal spasm with  clear liquid, infrequent , but frightening, refer GI for eval

## 2023-07-02 NOTE — Telephone Encounter (Signed)
*  Asthma/Allergy  Pharmacy Patient Advocate Encounter  Received notification from [facility] that Prior Authorization for [facility] 93MCG/ACT exhaler suspension  has been APPROVED from 06/02/2023 to 06/30/2024. Ran test claim, Copay is $0.00. This test claim was processed through [facility]- copay amounts may vary at other pharmacies due to pharmacy/plan contracts, or as the patient moves through the different stages of their insurance plan.   PA #/Case ID/Reference #: [id number]

## 2023-07-02 NOTE — Assessment & Plan Note (Signed)
Annual exam as documented. Counseling done  re healthy lifestyle involving commitment to 150 minutes exercise per week, heart healthy diet, and attaining healthy weight.The importance of adequate sleep also discussed.  Immunization and cancer screening needs are specifically addressed at this visit.

## 2023-07-03 NOTE — Telephone Encounter (Signed)
Allison Hart, I placed orders. Can you release if needed?

## 2023-07-05 LAB — CBC WITH DIFFERENTIAL/PLATELET
Basophils Absolute: 0 10*3/uL (ref 0.0–0.2)
Basos: 1 %
EOS (ABSOLUTE): 0.1 10*3/uL (ref 0.0–0.4)
Eos: 3 %
Hematocrit: 38.6 % (ref 34.0–46.6)
Hemoglobin: 11.5 g/dL (ref 11.1–15.9)
Immature Grans (Abs): 0 10*3/uL (ref 0.0–0.1)
Immature Granulocytes: 0 %
Lymphocytes Absolute: 1 10*3/uL (ref 0.7–3.1)
Lymphs: 28 %
MCH: 27 pg (ref 26.6–33.0)
MCHC: 29.8 g/dL — ABNORMAL LOW (ref 31.5–35.7)
MCV: 91 fL (ref 79–97)
Monocytes Absolute: 0.5 10*3/uL (ref 0.1–0.9)
Monocytes: 13 %
Neutrophils Absolute: 2 10*3/uL (ref 1.4–7.0)
Neutrophils: 55 %
Platelets: 235 10*3/uL (ref 150–450)
RBC: 4.26 x10E6/uL (ref 3.77–5.28)
RDW: 13.9 % (ref 11.7–15.4)
WBC: 3.7 10*3/uL (ref 3.4–10.8)

## 2023-07-05 LAB — HEPATIC FUNCTION PANEL
ALT: 15 IU/L (ref 0–32)
AST: 21 IU/L (ref 0–40)
Albumin: 4.4 g/dL (ref 3.8–4.9)
Alkaline Phosphatase: 156 IU/L — ABNORMAL HIGH (ref 44–121)
Bilirubin Total: 0.4 mg/dL (ref 0.0–1.2)
Bilirubin, Direct: 0.14 mg/dL (ref 0.00–0.40)
Total Protein: 7.8 g/dL (ref 6.0–8.5)

## 2023-07-05 LAB — GAMMA GT: GGT: 19 IU/L (ref 0–60)

## 2023-07-05 LAB — IGG: IgG (Immunoglobin G), Serum: 1861 mg/dL — ABNORMAL HIGH (ref 586–1602)

## 2023-07-08 ENCOUNTER — Encounter: Payer: Self-pay | Admitting: Family Medicine

## 2023-07-11 ENCOUNTER — Ambulatory Visit (INDEPENDENT_AMBULATORY_CARE_PROVIDER_SITE_OTHER): Payer: Commercial Managed Care - PPO | Admitting: Gastroenterology

## 2023-07-11 ENCOUNTER — Encounter: Payer: Self-pay | Admitting: Gastroenterology

## 2023-07-11 VITALS — BP 123/80 | HR 103 | Temp 98.7°F | Ht 69.0 in | Wt 197.8 lb

## 2023-07-11 DIAGNOSIS — K5900 Constipation, unspecified: Secondary | ICD-10-CM | POA: Diagnosis not present

## 2023-07-11 DIAGNOSIS — K743 Primary biliary cirrhosis: Secondary | ICD-10-CM | POA: Diagnosis not present

## 2023-07-11 NOTE — Patient Instructions (Addendum)
For constipation, use miralax, two capfuls in 20 ounces of water daily until soft stool, then continue one capful daily as needed. Let's repeat your labs in 09/2023 as your recent knee replacement could have increased the alkaline phosphatase. We will send you reminder close to time due.  Continue ursodiol 600mg  twice daily.

## 2023-07-11 NOTE — Progress Notes (Signed)
GI Office Note    Referring Provider: Kerri Perches, MD Primary Care Physician:  Kerri Perches, MD  Primary Gastroenterologist: Roetta Sessions, MD   Chief Complaint   Chief Complaint  Patient presents with   esophageal spasms    States that she gets choked mostly when drinking liquids.    History of Present Illness   Allison Hart is a 62 y.o. female presenting today for follow up. H/o primary biliary cholangitis.   In September 2023 she had a positive AMA, ANA, elevated IgG.  Alk phos of 160, AST 39.  Historically she has had chronically elevated Alk phos and minimally elevated transaminases. Hepatitis B and C markers were negative.  Ferritin was elevated at 198 with normal iron saturations of 29%.  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 Plans to monitor LFTs, if AST/ALT, IgG rise further with normalization of alk phos, then would consider liver biopsy to rule out overlapping autoimmune hepatitis.   Labs from 1 week ago with white blood cell count 3700, hemoglobin 11.5 normal, MCV 91, platelets 235,000, albumin 4.4, total bilirubin 0.4, alkaline phosphatase 156, AST 21, ALT 15, GGT 19, IgG 1861.  She had knee replacement in 04/18/2023. Went back to work last week. The other knee to be replaced 10/2023. She thinks she may be having esophageal spasms at times. She will start to drink liquids and then feels pressure and can't breathe. She does not cough. No issues swallowing food. No heartburn. Stopped omeprazole due to improvement in symptoms but recently restarted. No abdominal pain.she had some more constipation issues after surgery. Used miralax daily but after her surgery she used magnesium citrate and bisacodyl per her discharge instructions.   Component     Latest Ref Rng 04/04/2021 06/29/2022 09/05/2022  11/02/2022 01/23/2023  Total Bilirubin     0.0 - 1.2 mg/dL 0.3  0.5  0.4  0.4  0.6   AST     0 - 40 IU/L 39  38  49 (H)  39  31   ALT     0 - 32 IU/L 40 (H)  39 (H)  53 (H)  41 (H)  28   Albumin     3.8 - 4.9 g/dL 4.5  4.8  4.7  4.5  4.7   Alkaline Phosphatase     44 - 121 IU/L 122 (H)  160 (H)  149 (H)  127 (H)  134 (H)    Component     Latest Ref Rng 02/21/2023 07/04/2023  Total Bilirubin     0.0 - 1.2 mg/dL 0.4  0.4   AST     0 - 40 IU/L 22  21   ALT     0 - 32 IU/L 20  15   Albumin     3.8 - 4.9 g/dL 4.3  4.4   Alkaline Phosphatase     44 - 121 IU/L 130 (H)  156 (H)        Colonoscopy 12/2022: -non-bleeding internal hemorrhoids, mild, medium sized, and grade I -diverticulosis -next colonoscopy in 10 years  EGD November 2019: -Extrinsic mass-effect on mid esophagus of uncertain significance. Pulsatile Component. No Biopsies taken. Abnormal gastric mucosa as described above?status post biopsy. In reviewing the record, back in 2017, patient was seen by oncology for intrathoracic adenopathy.  She had significant adenopathy. Follow-up chest CT was recommended but apparently this was not done.  Hyperplastic gastric polyp.  Gastritis but no H. pylori. Chest CT from December 2019 showed left pulmonary vein impinging on the esophagus likely the source of the findings.  Stable borderline enlarged supraclavicular and axillary lymph nodes bilaterally, requested PCP to follow.   Colonoscopy 11/2012: diverticulosis, small hemorrhoids. Next colonoscopy 10 years.   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 Right renal artery and stented segment remain patent. Excluded aneurysm remains thrombosed with similar size measurements compared to 2021. NON-VASCULAR No other acute intra-abdominal or pelvic finding. Stable chronic findings as above.  Liver unremarkable.   Medications   Current  Outpatient Medications  Medication Sig Dispense Refill   acyclovir (ZOVIRAX) 400 MG tablet Take 1 tablet (400 mg total) by mouth 2 (two) times daily. (Patient taking differently: Take 400 mg by mouth daily as needed.) 60 tablet 3   albuterol (VENTOLIN HFA) 108 (90 Base) MCG/ACT inhaler INHALE 2 PUFFS BY MOUTH EVERY 6 HOURS AS NEEDED FOR SHORTNESS OF BREATH OR  WHEEZING 9 g 0   amlodipine-olmesartan (AZOR) 10-20 MG tablet Take 1 tablet by mouth once daily 90 tablet 0   aspirin EC 81 MG tablet Take 1 tablet (81 mg total) by mouth daily. 100 tablet 2   Azelastine HCl 137 MCG/SPRAY SOLN USE 2 SPRAY(S) IN EACH NOSTRIL TWICE DAILY AS DIRECTED 30 mL 0   cholecalciferol (VITAMIN D) 1000 units tablet Take 1,000 Units by mouth daily.     Cyanocobalamin (VITAMIN B-12 PO) Take 1 tablet by mouth daily.     Fluticasone-Umeclidin-Vilant (TRELEGY ELLIPTA) 100-62.5-25 MCG/ACT AEPB Inhale 1 puff into the lungs daily. 1 each 5   furosemide (LASIX) 40 MG tablet Take 1 tablet by mouth once daily 90 tablet 0   Lidocaine 5 % CREA Knee pain/and right hip pain     montelukast (SINGULAIR) 10 MG tablet TAKE 1 TABLET BY MOUTH AT BEDTIME 90 tablet 0   omeprazole (PRILOSEC) 40 MG capsule Take 1 capsule (40 mg total) by mouth daily. 30 capsule 3   potassium chloride SA (KLOR-CON M) 20 MEQ tablet Take 1 tablet by mouth twice daily 180 tablet 0   Pyridoxine HCl (VITAMIN B-6 PO) Take 1 tablet by mouth daily.     spironolactone (ALDACTONE) 25 MG tablet Take 1 tablet by mouth once daily 90 tablet 0   trolamine salicylate (ASPERCREME) 10 % cream Apply 1 Application topically as needed (Knee pain).     ursodiol (ACTIGALL) 300 MG capsule Take 2 capsules (600 mg total) by mouth 2 (two) times daily with a meal. 120 capsule 11   XHANCE 93 MCG/ACT EXHU USE 1 SPRAY IN EACH NOSTRIL TWICE DAILY AS DIRECTED AS NEEDED FOR STUFFY NOSE 16 mL 5   No current facility-administered medications for this visit.    Allergies   Allergies as of  07/11/2023 - Review Complete 07/11/2023  Allergen Reaction Noted   Versed [midazolam] Shortness Of Breath and Other (See Comments) 01/18/2016   Misc. sulfonamide containing compounds Itching 03/14/2023   Sulfa antibiotics Other (See Comments) 08/02/2018        Review of Systems   General: Negative for anorexia, weight loss, fever, chills, fatigue, weakness. ENT: Negative for hoarseness, difficulty swallowing , nasal congestion. CV: Negative for chest pain, angina, palpitations, dyspnea on exertion, peripheral edema.  Respiratory: Negative for dyspnea at rest, dyspnea on exertion, cough, sputum,  wheezing.  GI: See history of present illness. GU:  Negative for dysuria, hematuria, urinary incontinence, urinary frequency, nocturnal urination.  Endo: Negative for unusual weight change.     Physical Exam   BP 123/80 (BP Location: Right Arm, Patient Position: Sitting, Cuff Size: Normal)   Pulse (!) 103   Temp 98.7 F (37.1 C) (Oral)   Ht 5\' 9"  (1.753 m)   Wt 197 lb 12.8 oz (89.7 kg)   LMP  (LMP Unknown)   SpO2 98%   BMI 29.21 kg/m    General: Well-nourished, well-developed in no acute distress.  Eyes: No icterus. Mouth: Oropharyngeal mucosa moist and pink  Abdomen: Bowel sounds are normal, nontender, nondistended, no hepatosplenomegaly or masses,  no abdominal bruits or hernia , no rebound or guarding.  Rectal: not performed  Extremities: No lower extremity edema. No clubbing or deformities. Neuro: Alert and oriented x 4   Skin: Warm and dry, no jaundice.   Psych: Alert and cooperative, normal mood and affect.  Labs   See hpi  Imaging Studies   No results found.  Assessment   *PBC *Constipation  PBC diagnosed 06/2022 as outlined above. She has been on ursodiol since 09/2022. She had normalization of her AST/ALT and some mild improvement in alkaline phosphate, tbili has been between 0.4 and 0.6. recently her alk phos bumped up. This could be due to recent knee  replacement surgery therefore we will check her numbers again in a couple of months.   Constipation poorly controlled.  Occasional swallowing issues with liquids, with pressure in the chest and feels like she can't breathe. Denies sensation of liquid aspirating. Continue to monitor for now given infrequent symptoms.   PLAN   Continue ursodiol 600mg  BID.  Miralax 2 capfuls in 20 ounces of water daily until soft stool, then one capful daily as needed. Use bisacodyl or magnesium citrate sparingly. Repeat LFTs in 09/2023. Prior to next knee replacement.   Leanna Battles. Melvyn Neth, MHS, PA-C [facility]

## 2023-07-20 ENCOUNTER — Other Ambulatory Visit: Payer: Self-pay | Admitting: Family Medicine

## 2023-07-21 ENCOUNTER — Other Ambulatory Visit: Payer: Self-pay | Admitting: Family Medicine

## 2023-08-06 ENCOUNTER — Other Ambulatory Visit: Payer: Self-pay | Admitting: Family Medicine

## 2023-08-07 ENCOUNTER — Ambulatory Visit: Payer: Commercial Managed Care - PPO | Attending: Internal Medicine | Admitting: Internal Medicine

## 2023-08-07 VITALS — BP 124/72 | HR 95 | Ht 69.0 in | Wt 202.2 lb

## 2023-08-07 DIAGNOSIS — I10 Essential (primary) hypertension: Secondary | ICD-10-CM | POA: Diagnosis not present

## 2023-08-07 NOTE — Patient Instructions (Signed)
Medication Instructions:  No change  *If you need a refill on your cardiac medications before your next appointment, please call your pharmacy*   Lab Work: none   Testing/Procedures: None    Follow-Up: At [facility], you and your health needs are our priority.  As part of our continuing mission to provide you with exceptional heart care, we have created designated Provider Care Teams.  These Care Teams include your primary Cardiologist (physician) and Advanced Practice Providers (APPs -  Physician Assistants and Nurse Practitioners) who all work together to provide you with the care you need, when you need it.  Your next appointment:   6 month(s)  Provider:   Laurin Coder

## 2023-08-07 NOTE — Progress Notes (Signed)
Cardiology Office Note:  .   Date:  08/07/2023  ID:  [name], DOB [birth year], MRN [id number] PCP: Kerri Perches, MD  [facility] Providers Cardiologist:  Lance Muss, MD    History of Present Illness: .   Allison Hart is a 62 y.o. female former patient of Dr. Eldridge Dace.  She had a normal stress echo in 2013.  She has history of renal artery aneurysm status post stenting .  She was recommended to see Dr. Eldridge Dace with concern for pulmonary hypertension.  She had an echocardiogram in 2018 that showed normal LV function.  She had an RVSP of 46 mmHg which is moderately increased.  Presumably the assumption was that she had diastolic dysfunction/HFpEF as a cause of her pulmonary hypertension.  She was started on lasix.  In the past in 2018 she had dyspnea on exertion which increased with lasix. She mild moderate MR.  She has no history of pulmonary embolism.  She is a prior smoker.  She also has history of obstructive sleep apnea and she is on a CPAP. Prior negative HIV.  She was seen for preop visit by Bernadene Person in June 2024.  She was asymptomatic. She is s/p knee replacement on the right. She did well.    Today, she is doing well. She is on lasix.  She denies any shortness of breath, PND, or orthopnea.  She has no lower extremity edema.  She has had no hospitalizations for heart failure.  She has sinus congestion. She has PBC.   ROS:  per HPI otherwise negative   Current Outpatient Medications on File Prior to Visit  Medication Sig Dispense Refill   acyclovir (ZOVIRAX) 400 MG tablet Take 1 tablet (400 mg total) by mouth 2 (two) times daily. (Patient taking differently: Take 400 mg by mouth daily as needed.) 60 tablet 3   albuterol (VENTOLIN HFA) 108 (90 Base) MCG/ACT inhaler INHALE 2 PUFFS BY MOUTH EVERY 6 HOURS AS NEEDED FOR SHORTNESS OF BREATH OR  WHEEZING 9 g 0   amlodipine-olmesartan (AZOR) 10-20 MG tablet Take 1 tablet by mouth once daily 90 tablet 0   aspirin  EC 81 MG tablet Take 1 tablet (81 mg total) by mouth daily. 100 tablet 2   Azelastine HCl 137 MCG/SPRAY SOLN USE 2 SPRAY(S) IN EACH NOSTRIL TWICE DAILY AS DIRECTED 30 mL 0   cholecalciferol (VITAMIN D) 1000 units tablet Take 1,000 Units by mouth daily.     Cyanocobalamin (VITAMIN B-12 PO) Take 1 tablet by mouth daily.     Fluticasone-Umeclidin-Vilant (TRELEGY ELLIPTA) 100-62.5-25 MCG/ACT AEPB Inhale 1 puff into the lungs daily. 1 each 5   furosemide (LASIX) 40 MG tablet Take 1 tablet by mouth once daily 90 tablet 0   Lidocaine 5 % CREA Knee pain/and right hip pain     montelukast (SINGULAIR) 10 MG tablet TAKE 1 TABLET BY MOUTH AT BEDTIME 90 tablet 0   omeprazole (PRILOSEC) 40 MG capsule Take 1 capsule (40 mg total) by mouth daily. 30 capsule 3   potassium chloride SA (KLOR-CON M) 20 MEQ tablet Take 1 tablet by mouth twice daily 180 tablet 0   Pyridoxine HCl (VITAMIN B-6 PO) Take 1 tablet by mouth daily.     spironolactone (ALDACTONE) 25 MG tablet Take 1 tablet by mouth once daily 90 tablet 0   trolamine salicylate (ASPERCREME) 10 % cream Apply 1 Application topically as needed (Knee pain).     ursodiol (ACTIGALL) 300 MG capsule Take 2  capsules (600 mg total) by mouth 2 (two) times daily with a meal. 120 capsule 11   XHANCE 93 MCG/ACT EXHU USE 1 SPRAY IN EACH NOSTRIL TWICE DAILY AS DIRECTED AS NEEDED FOR STUFFY NOSE 16 mL 5   No current facility-administered medications on file prior to visit.     Studies Reviewed: Marland Kitchen        TTE 08/16/2022 EF 60-65%, LVH, norrnal RV fxn, no sign valve disease, mild MR  TTE 03/28/2017 EF 55-60%, RVSP 46 mmHg Risk Assessment/Calculations:        Physical Exam:   VS:   Vitals:   08/07/23 1113  BP: 124/72  Pulse: 95  SpO2: 97%    Wt Readings from Last 3 Encounters:  07/11/23 197 lb 12.8 oz (89.7 kg)  06/28/23 196 lb 0.6 oz (88.9 kg)  06/06/23 193 lb (87.5 kg)    GEN: Well nourished, well developed in no acute distress NECK: No JVD; No carotid  bruits CARDIAC: RRR, no murmurs, rubs, gallops RESPIRATORY:  Clear to auscultation without rales, wheezing or rhonchi  ABDOMEN: Soft, non-tender, non-distended EXTREMITIES:  No edema; No deformity   ASSESSMENT AND PLAN: .   Pulmonary HTN:  RVSP 30 mmHg which is mild. TR Vmax 2.6 m/s - ?Group II (she has had no history of requiring diuresis for diastolic heart failure.  Her ED to E / e' is less than 14; I have low suspicion that group 2 is contributing )  and Group III OSA on CPAP also has allergic asthma. - she is asymptomatic. If she develops SOB, will plan for a repeat echo. If RVSP increases, will get a RHC. She has autoimmune diagnoses. We discussed that she can come off lasix in the future if it becomes an issue.  HTN - well controlled.  Continue current regimen.  Renal artery aneurysm -continue asa 81 mg daily     Dispo: Follow up 6 months  Signed, Cayci Mcnabb, Alben Spittle, MD

## 2023-08-18 ENCOUNTER — Other Ambulatory Visit: Payer: Self-pay | Admitting: Gastroenterology

## 2023-08-20 ENCOUNTER — Telehealth: Payer: Self-pay

## 2023-08-20 NOTE — Telephone Encounter (Signed)
Patient's insurance which is [employer] is not covering exhance and any further refills. The alternative is fluticasone propionate (generic for flonase) is it ok to send that into patient's pharmacy?

## 2023-08-20 NOTE — Telephone Encounter (Signed)
Left patient a message that her xhance is not being covered by her insurance and that we sent in fluticasone nasal spray to [facility] [address], [place], [place]. I also sent a message through her MyChart.

## 2023-08-20 NOTE — Telephone Encounter (Signed)
Flonase is fine.     Thank you

## 2023-09-07 ENCOUNTER — Other Ambulatory Visit: Payer: Self-pay | Admitting: Family Medicine

## 2023-09-11 ENCOUNTER — Telehealth: Payer: Self-pay | Admitting: *Deleted

## 2023-09-11 NOTE — Telephone Encounter (Signed)
   Pre-operative Risk Assessment    Patient Name: Allison Hart  DOB: [birth year] MRN: [id number]  DATE OF LAST VISIT: 08/07/23 DR. MARY BRANCH DATE OF NEXT VISIT: NONE    Request for Surgical Clearance    Procedure:   LEFT TOTAL KNEE ARTHROPLASTY  Date of Surgery:  Clearance 12/11/23                                 Surgeon:  DR. Ollen Gross Surgeon's Group or Practice Name:  [facility] Phone number:  [phone] A[facility] Fax number:  [phone]   Type of Clearance Requested:   - Medical  - Pharmacy:  Hold Aspirin     Type of Anesthesia:   CHOICE   Additional requests/questions:    Elpidio Anis   09/11/2023, 6:12 PM

## 2023-09-12 ENCOUNTER — Encounter: Payer: Self-pay | Admitting: Family Medicine

## 2023-09-12 NOTE — Telephone Encounter (Signed)
Pt has been scheduled to see Dr. Carolan Clines 10/30/23 for preop clearance. I will update all parties involved.

## 2023-09-12 NOTE — Telephone Encounter (Signed)
   Name: Allison Hart  DOB: [birth year]  MRN: [id number]  Primary Cardiologist: Carolan Clines, MD   Reviewed chart. Surgery is not scheduled until 12/11/2023. Patient was recently seen by Dr. Carolan Clines on 08/07/2023 Preoperative team for follow-up of hypertension. She was doing well at that time. Dr. Verna Czech note mentions plans for repeat Echo if she has recurrent shortness of breath. Therefore, recommend set up a phone call appointment to make sure she has not had any new shortness of breath and further preoperative risk assessment. Please scheduled this for late January/ early February. Please obtain consent and complete medication review. Thank you for your help.  In regards to recommendations for holding Aspirin, it looks like this has been prescribed for her PCP. She has no known CAD and is not on Aspirin for a cardiac reason. Therefore, OK to hold Aspirin from out standpoint but would also reach out to PCP.   I also confirmed the patient resides in the state of [place]. As per [facility] Medical Board telemedicine laws, the patient must reside in the state in which the provider is licensed.   Corrin Parker, PA-C 09/12/2023, 8:03 AM [facility]

## 2023-09-18 ENCOUNTER — Ambulatory Visit: Payer: Commercial Managed Care - PPO | Admitting: Family Medicine

## 2023-09-28 ENCOUNTER — Telehealth: Payer: Self-pay

## 2023-09-28 NOTE — Telephone Encounter (Signed)
Spoke with patient and she said her insurance isn't covering the xhance so I told patient she has to try the fluticasone before her insurance will cover the exhance. Patient did say she tried fluticasone years ago through her pcp. Patient will try the fluticasone nasal spray again. Patient said she liked the xhance better. Told the patient once she has tried the fluticasone for a month we can send in a PA. Patient was fine with that. Sherrie fax'd it back to [employer] I had to circle yes for patient to start alternative fluticasone for xhance.

## 2023-10-12 ENCOUNTER — Other Ambulatory Visit: Payer: Self-pay | Admitting: Family Medicine

## 2023-10-16 ENCOUNTER — Other Ambulatory Visit: Payer: Self-pay | Admitting: Family Medicine

## 2023-10-16 NOTE — Telephone Encounter (Signed)
 Spoke with patient and she did receive her xhance.

## 2023-10-17 ENCOUNTER — Other Ambulatory Visit: Payer: Self-pay | Admitting: Family Medicine

## 2023-10-18 ENCOUNTER — Other Ambulatory Visit: Payer: Self-pay | Admitting: Family Medicine

## 2023-10-23 ENCOUNTER — Encounter: Payer: Self-pay | Admitting: Family Medicine

## 2023-10-25 ENCOUNTER — Other Ambulatory Visit: Payer: Self-pay | Admitting: Family Medicine

## 2023-10-25 LAB — CBC
Hematocrit: 38.9 % (ref 34.0–46.6)
Hemoglobin: 12 g/dL (ref 11.1–15.9)
MCH: 27.7 pg (ref 26.6–33.0)
MCHC: 30.8 g/dL — ABNORMAL LOW (ref 31.5–35.7)
MCV: 90 fL (ref 79–97)
Platelets: 269 10*3/uL (ref 150–450)
RBC: 4.33 x10E6/uL (ref 3.77–5.28)
RDW: 12.5 % (ref 11.7–15.4)
WBC: 3.4 10*3/uL (ref 3.4–10.8)

## 2023-10-25 LAB — CMP14+EGFR
ALT: 25 [IU]/L (ref 0–32)
AST: 28 [IU]/L (ref 0–40)
Albumin: 4.4 g/dL (ref 3.9–4.9)
Alkaline Phosphatase: 156 [IU]/L — ABNORMAL HIGH (ref 44–121)
BUN/Creatinine Ratio: 16 (ref 12–28)
BUN: 14 mg/dL (ref 8–27)
Bilirubin Total: 0.5 mg/dL (ref 0.0–1.2)
CO2: 26 mmol/L (ref 20–29)
Calcium: 9.9 mg/dL (ref 8.7–10.3)
Chloride: 101 mmol/L (ref 96–106)
Creatinine, Ser: 0.88 mg/dL (ref 0.57–1.00)
Globulin, Total: 3.5 g/dL (ref 1.5–4.5)
Glucose: 83 mg/dL (ref 70–99)
Potassium: 4.2 mmol/L (ref 3.5–5.2)
Sodium: 142 mmol/L (ref 134–144)
Total Protein: 7.9 g/dL (ref 6.0–8.5)
eGFR: 75 mL/min/{1.73_m2} (ref >59)

## 2023-10-25 LAB — LIPID PANEL
Chol/HDL Ratio: 4.3 {ratio} (ref 0.0–4.4)
Cholesterol, Total: 206 mg/dL — ABNORMAL HIGH (ref 100–199)
HDL: 48 mg/dL (ref >39)
LDL Chol Calc (NIH): 142 mg/dL — ABNORMAL HIGH (ref 0–99)
Triglycerides: 89 mg/dL (ref 0–149)
VLDL Cholesterol Cal: 16 mg/dL (ref 5–40)

## 2023-10-25 LAB — VITAMIN D 25 HYDROXY (VIT D DEFICIENCY, FRACTURES): Vit D, 25-Hydroxy: 66.2 ng/mL (ref 30.0–100.0)

## 2023-10-25 LAB — TSH: TSH: 0.908 u[IU]/mL (ref 0.450–4.500)

## 2023-10-30 ENCOUNTER — Encounter: Payer: Self-pay | Admitting: Internal Medicine

## 2023-10-30 ENCOUNTER — Ambulatory Visit: Payer: Commercial Managed Care - PPO | Admitting: Family Medicine

## 2023-10-30 ENCOUNTER — Encounter: Payer: Self-pay | Admitting: Family Medicine

## 2023-10-30 ENCOUNTER — Ambulatory Visit: Payer: Commercial Managed Care - PPO | Attending: Internal Medicine | Admitting: Internal Medicine

## 2023-10-30 VITALS — BP 133/87 | HR 98 | Ht 69.0 in | Wt 207.0 lb

## 2023-10-30 VITALS — BP 116/82 | HR 90 | Ht 69.0 in | Wt 210.0 lb

## 2023-10-30 DIAGNOSIS — Z23 Encounter for immunization: Secondary | ICD-10-CM | POA: Diagnosis not present

## 2023-10-30 DIAGNOSIS — I10 Essential (primary) hypertension: Secondary | ICD-10-CM

## 2023-10-30 DIAGNOSIS — M549 Dorsalgia, unspecified: Secondary | ICD-10-CM | POA: Diagnosis not present

## 2023-10-30 DIAGNOSIS — K743 Primary biliary cirrhosis: Secondary | ICD-10-CM

## 2023-10-30 DIAGNOSIS — Z01818 Encounter for other preprocedural examination: Secondary | ICD-10-CM

## 2023-10-30 DIAGNOSIS — I272 Pulmonary hypertension, unspecified: Secondary | ICD-10-CM

## 2023-10-30 NOTE — Patient Instructions (Signed)
Medication Instructions:  No changes  *If you need a refill on your cardiac medications before your next appointment, please call your pharmacy*   Lab Work: None  If you have labs (blood work) drawn today and your tests are completely normal, you will receive your results only by: MyChart Message (if you have MyChart) OR A paper copy in the mail If you have any lab test that is abnormal or we need to change your treatment, we will call you to review the results.   Testing/Procedures: None   Follow-Up: At [facility], you and your health needs are our priority.  As part of our continuing mission to provide you with exceptional heart care, we have created designated Provider Care Teams.  These Care Teams include your primary Cardiologist (physician) and Advanced Practice Providers (APPs -  Physician Assistants and Nurse Practitioners) who all work together to provide you with the care you need, when you need it.      Your next appointment:   6 month(s) with an APP   Provider:   Carolan Clines   Other Instructions

## 2023-10-30 NOTE — Progress Notes (Signed)
Cardiology Office Note:  .   Date:  10/30/2023  ID:  Allison Hart, DOB September 08, 1961, MRN [id number] PCP: Allison Perches, MD  [facility] Providers Cardiologist:  Allison Muss, MD    History of Present Illness: .   Allison Hart is a 63 y.o. female former patient of Dr. Eldridge Dace.  She had a normal stress echo in 2013.  She has history of renal artery aneurysm status post stenting .  She was recommended to see Dr. Eldridge Dace with concern for pulmonary hypertension.  She had an echocardiogram in 2018 that showed normal LV function.  She had an RVSP of 46 mmHg which is moderately increased.  Presumably the assumption was that she had diastolic dysfunction/HFpEF as a cause of her pulmonary hypertension.  She was started on lasix.  In the past in 2018 she had dyspnea on exertion which increased with lasix. She mild moderate MR.  She has no history of pulmonary embolism.  She is a prior smoker.  She also has history of obstructive sleep apnea and she is on a CPAP. Prior negative HIV.  She was seen for preop visit by Bernadene Person in June 2024.  She was asymptomatic. She is s/p knee replacement on the right. She did well.    Today, she is doing well. She is on lasix.  She denies any shortness of breath, PND, or orthopnea.  She has no lower extremity edema.  She has had no hospitalizations for heart failure.  She has sinus congestion. She has PBC.   ROS:  per HPI otherwise negative  Interim hx 10/29/2022 She is here for preoperative evaluation for left knee arthroplasty.  She is looking forward to this procedure . She can climb stairs without any limiting chest pain or shortness of breath.  She can conduct her activities of daily living without any issues.  She notes some mild deconditioning but that is all.   Current Outpatient Medications on File Prior to Visit  Medication Sig Dispense Refill   acyclovir (ZOVIRAX) 400 MG tablet Take 1 tablet (400 mg total) by mouth 2 (two) times daily.  (Patient taking differently: Take 400 mg by mouth daily as needed.) 60 tablet 3   albuterol (VENTOLIN HFA) 108 (90 Base) MCG/ACT inhaler INHALE 2 PUFFS BY MOUTH EVERY 6 HOURS AS NEEDED FOR SHORTNESS OF BREATH OR  WHEEZING 9 g 0   amlodipine-olmesartan (AZOR) 10-20 MG tablet Take 1 tablet by mouth once daily 90 tablet 0   aspirin EC 81 MG tablet Take 1 tablet (81 mg total) by mouth daily. 100 tablet 2   Azelastine HCl 137 MCG/SPRAY SOLN USE 2 SPRAY(S) IN EACH NOSTRIL TWICE DAILY AS DIRECTED 30 mL 0   cholecalciferol (VITAMIN D) 1000 units tablet Take 1,000 Units by mouth daily.     Cyanocobalamin (VITAMIN B-12 PO) Take 1 tablet by mouth daily.     Fluticasone-Umeclidin-Vilant (TRELEGY ELLIPTA) 100-62.5-25 MCG/ACT AEPB Inhale 1 puff into the lungs daily. 1 each 5   furosemide (LASIX) 40 MG tablet Take 1 tablet by mouth once daily 90 tablet 0   Lidocaine 5 % CREA Knee pain/and right hip pain     montelukast (SINGULAIR) 10 MG tablet TAKE 1 TABLET BY MOUTH AT BEDTIME 90 tablet 0   omeprazole (PRILOSEC) 40 MG capsule Take 1 capsule by mouth once daily 30 capsule 0   potassium chloride SA (KLOR-CON M) 20 MEQ tablet Take 1 tablet by mouth twice daily 180 tablet 0   Pyridoxine  HCl (VITAMIN B-6 PO) Take 1 tablet by mouth daily.     spironolactone (ALDACTONE) 25 MG tablet Take 1 tablet by mouth once daily 90 tablet 0   trolamine salicylate (ASPERCREME) 10 % cream Apply 1 Application topically as needed (Knee pain).     ursodiol (ACTIGALL) 300 MG capsule TAKE 2 CAPSULES BY MOUTH TWICE DAILY WITH A MEAL 120 capsule 5   XHANCE 93 MCG/ACT EXHU USE 1 SPRAY IN EACH NOSTRIL TWICE DAILY AS DIRECTED AS NEEDED FOR STUFFY NOSE 16 mL 5   No current facility-administered medications on file prior to visit.     Studies Reviewed: Marland Kitchen        TTE 08/16/2022 EF 60-65%, LVH, norrnal RV fxn, no sign valve disease, mild MR  TTE 03/28/2017 EF 55-60%, RVSP 46 mmHg Risk Assessment/Calculations:        Physical Exam:    VS:   There were no vitals filed for this visit.   Wt Readings from Last 3 Encounters:  10/30/23 207 lb (93.9 kg)  08/07/23 202 lb 3.2 oz (91.7 kg)  07/11/23 197 lb 12.8 oz (89.7 kg)    GEN: Well nourished, well developed in no acute distress NECK: No JVD; No carotid bruits CARDIAC: RRR, no murmurs, rubs, gallops RESPIRATORY:  Clear to auscultation without rales, wheezing or rhonchi  ABDOMEN: Soft, non-tender, non-distended EXTREMITIES:  No edema; No deformity   ASSESSMENT AND PLAN: .   Pre-Op She can walk > 4 METS without shortness of breath or chest pressure. She is low risk for low risk surgery. She is acceptable risk for left knee arthroplasty.  Mild pulmonary hypertension history: last RVSP 30 mmHg which is mild. TR Vmax 2.6 m/s -she has had no history of requiring diuresis for diastolic heart failure.  Her ED to E / e' is less than 14; she does not have signs of elevated filling pressures.  This is not really a significant issue.  She had mild pulmonary hypertension 2018.  This has resolved   HTN - well controlled.  Continue current regimen.  Renal artery aneurysm -continue asa 81 mg daily     Dispo: Follow up 6 months with an APP  Signed, Bradi Arbuthnot, Alben Spittle, MD

## 2023-10-30 NOTE — Patient Instructions (Addendum)
 F/U in 4 months, call if you need me sooner  All the best with right knee replacement   CCUA and C/s if abnormal today  cXR and of low back today if possible but by tomorrow  Need to reduce fried and fatty foods and change food choice for weight loss  Pneumonia 20 today Covid vaccine recommended next week   I will reach out to GI for appt and f/u   Thanks for choosing [facility], we consider it a privelige to serve you.

## 2023-10-31 ENCOUNTER — Ambulatory Visit (HOSPITAL_COMMUNITY)
Admission: RE | Admit: 2023-10-31 | Discharge: 2023-10-31 | Disposition: A | Discharge status: Home / Self Care | Payer: Commercial Managed Care - PPO | Source: Ambulatory Visit | Attending: Family Medicine | Admitting: Family Medicine

## 2023-10-31 DIAGNOSIS — I10 Essential (primary) hypertension: Secondary | ICD-10-CM | POA: Diagnosis present

## 2023-10-31 DIAGNOSIS — Z01818 Encounter for other preprocedural examination: Secondary | ICD-10-CM | POA: Diagnosis present

## 2023-10-31 DIAGNOSIS — M549 Dorsalgia, unspecified: Secondary | ICD-10-CM | POA: Insufficient documentation

## 2023-11-01 ENCOUNTER — Encounter: Payer: Self-pay | Admitting: Family Medicine

## 2023-11-01 LAB — URINALYSIS
Bilirubin, UA: NEGATIVE
Glucose, UA: NEGATIVE
Ketones, UA: NEGATIVE
Nitrite, UA: NEGATIVE
Protein,UA: NEGATIVE
RBC, UA: NEGATIVE
Specific Gravity, UA: 1.015 (ref 1.005–1.030)
Urobilinogen, Ur: 0.2 mg/dL (ref 0.2–1.0)
pH, UA: 6 (ref 5.0–7.5)

## 2023-11-01 LAB — URINE CULTURE

## 2023-11-02 ENCOUNTER — Other Ambulatory Visit: Payer: Self-pay | Admitting: Family Medicine

## 2023-11-16 ENCOUNTER — Other Ambulatory Visit: Payer: Self-pay | Admitting: Family Medicine

## 2023-11-21 ENCOUNTER — Ambulatory Visit (HOSPITAL_COMMUNITY)
Admission: RE | Admit: 2023-11-21 | Discharge: 2023-11-21 | Disposition: A | Discharge status: Home / Self Care | Payer: Commercial Managed Care - PPO | Source: Ambulatory Visit | Attending: Family Medicine | Admitting: Family Medicine

## 2023-11-21 DIAGNOSIS — Z1231 Encounter for screening mammogram for malignant neoplasm of breast: Secondary | ICD-10-CM | POA: Insufficient documentation

## 2023-11-27 ENCOUNTER — Telehealth: Payer: Self-pay | Admitting: Family Medicine

## 2023-11-27 NOTE — Telephone Encounter (Signed)
 Printed and faxed

## 2023-11-27 NOTE — Telephone Encounter (Signed)
Copied from CRM [id number]. Topic: Medical Record Request - Provider/Facility Request >> Nov 27, 2023 12:42 PM Dennison Nancy wrote: Reason for CRM: lori calling from [facility]  recieved a the clearance form for sugery  still  need last office notes and last lab  Can call Lawson Fiscal at [phone]

## 2023-12-02 DIAGNOSIS — Z23 Encounter for immunization: Secondary | ICD-10-CM | POA: Insufficient documentation

## 2023-12-02 DIAGNOSIS — I10 Essential (primary) hypertension: Secondary | ICD-10-CM | POA: Insufficient documentation

## 2023-12-02 DIAGNOSIS — Z01818 Encounter for other preprocedural examination: Secondary | ICD-10-CM | POA: Insufficient documentation

## 2023-12-02 NOTE — Assessment & Plan Note (Signed)
 Updated CXR Normal, CCUA abn , sent for c/S and will follow up. Medically cleared based on history and exam, will follow up studies pending prior to signing off

## 2023-12-02 NOTE — Progress Notes (Signed)
   Allison Hart     MRN: [id number]      DOB: [birth year]  Chief Complaint  Patient presents with   Follow-up    Sx clearance, sciatic nerve last EKG 07/2023 [facility]    HPI [name] is here for medical clearance for knee replacement surgery, She has cardiology appt today as well for same Co low back pain radiating to RLE She has concerns re GI follow up as has no appt on file and lab review by GI also outstanding,  ROS Denies recent fever or chills. Denies sinus pressure, nasal congestion, ear pain or sore throat. Denies chest congestion, productive cough or wheezing. Denies chest pains, palpitations and leg swelling Denies abdominal pain, nausea, vomiting,diarrhea or constipation.   Denies dysuria, frequency, hesitancy or incontinence.  Denies headaches, seizures, numbness, or tingling. Denies depression, anxiety or insomnia. Denies skin break down or rash.   PE  BP 133/87 (BP Location: Right Arm, Patient Position: Sitting, Cuff Size: Large)   Pulse 98   Ht 5\' 9"  (1.753 m)   Wt 207 lb (93.9 kg)   LMP  (LMP Unknown)   SpO2 92%   BMI 30.57 kg/m    Patient alert and oriented and in no cardiopulmonary distress.  HEENT: No facial asymmetry, EOMI,     Neck supple .  Chest: Clear to auscultation bilaterally.  CVS: S1, S2 no murmurs, no S3.Regular rate.  ABD: Soft non tender.   Ext: No edema  MS: decreased  ROM lumbar spine,   reduced in right knee.  Skin: Intact, no ulcerations or rash noted.  Psych: Good eye contact, normal affect. Memory intact not anxious or depressed appearing.  CNS: CN 2-12 intact, power,  normal throughout.no focal deficits noted.   Assessment & Plan  Pre-op examination Updated CXR Normal, CCUA abn , sent for c/S and will follow up. Medically cleared based on history and exam, will follow up studies pending prior to signing off  Pulmonary hypertension, unspecified (HCC) Suboptimmal control, no med change DASH  diet and commitment to daily physical activity for a minimum of 30 minutes discussed and encouraged, as a part of hypertension management. The importance of attaining a healthy weight is also discussed.     10/30/2023    4:10 PM 10/30/2023    1:51 PM 08/07/2023   11:13 AM 07/11/2023    2:21 PM 06/28/2023    1:12 PM 06/06/2023    2:32 PM 02/27/2023    3:22 PM  BP/Weight  Systolic BP 116 133 124 123 131 118 120  Diastolic BP 82 87 72 80 88 80 84  Wt. (Lbs) 210 207 202.2 197.8 196.04 193   BMI 31.01 kg/m2 30.57 kg/m2 29.86 kg/m2 29.21 kg/m2 28.95 kg/m2 28.5 kg/m2        Primary biliary cholangitis (HCC) F/u wih GI needs appt  Back pain with radiation DG lumbar spine shows disc disease  Encounter for immunization After obtaining informed consent, the pneumonia 20 vaccine is  administered , with no adverse effect noted at the time of administration.

## 2023-12-02 NOTE — Assessment & Plan Note (Signed)
 F/u wih GI needs appt

## 2023-12-02 NOTE — Assessment & Plan Note (Signed)
 DG lumbar spine shows disc disease

## 2023-12-02 NOTE — Assessment & Plan Note (Signed)
 After obtaining informed consent, the pneumonia 20  vaccine is  administered , with no adverse effect noted at the time of administration.

## 2023-12-02 NOTE — Assessment & Plan Note (Signed)
 Suboptimmal control, no med change DASH diet and commitment to daily physical activity for a minimum of 30 minutes discussed and encouraged, as a part of hypertension management. The importance of attaining a healthy weight is also discussed.     10/30/2023    4:10 PM 10/30/2023    1:51 PM 08/07/2023   11:13 AM 07/11/2023    2:21 PM 06/28/2023    1:12 PM 06/06/2023    2:32 PM 02/27/2023    3:22 PM  BP/Weight  Systolic BP 116 133 124 123 131 118 120  Diastolic BP 82 87 72 80 88 80 84  Wt. (Lbs) 210 207 202.2 197.8 196.04 193   BMI 31.01 kg/m2 30.57 kg/m2 29.86 kg/m2 29.21 kg/m2 28.95 kg/m2 28.5 kg/m2

## 2023-12-06 ENCOUNTER — Other Ambulatory Visit: Payer: Self-pay | Admitting: Family Medicine

## 2023-12-13 ENCOUNTER — Other Ambulatory Visit: Payer: Self-pay | Admitting: Family Medicine

## 2024-01-10 ENCOUNTER — Telehealth: Payer: Self-pay | Admitting: Gastroenterology

## 2024-01-10 ENCOUNTER — Other Ambulatory Visit: Payer: Self-pay | Admitting: Family Medicine

## 2024-01-10 NOTE — Telephone Encounter (Signed)
 PCP reached out about persistently elevated AP. H/o PBC.   Please arrange for ov with Dr. Jena Gauss first available for next step in management of PBC.

## 2024-01-16 NOTE — Telephone Encounter (Signed)
 Patient has been scheduled with Dr. Jena Gauss

## 2024-01-24 ENCOUNTER — Other Ambulatory Visit: Payer: Self-pay | Admitting: Family Medicine

## 2024-01-25 ENCOUNTER — Other Ambulatory Visit: Payer: Self-pay | Admitting: Family Medicine

## 2024-01-28 ENCOUNTER — Other Ambulatory Visit: Payer: Self-pay | Admitting: Family Medicine

## 2024-01-28 NOTE — Telephone Encounter (Signed)
 Called and spoke to patient and informed her that she need an appointment and sent in her courtesy refill for her Xhance .

## 2024-01-29 ENCOUNTER — Other Ambulatory Visit: Payer: Self-pay | Admitting: Family Medicine

## 2024-02-04 ENCOUNTER — Ambulatory Visit (INDEPENDENT_AMBULATORY_CARE_PROVIDER_SITE_OTHER): Admitting: Internal Medicine

## 2024-02-04 ENCOUNTER — Other Ambulatory Visit: Payer: Self-pay | Admitting: Allergy & Immunology

## 2024-02-04 ENCOUNTER — Encounter: Payer: Self-pay | Admitting: Internal Medicine

## 2024-02-04 ENCOUNTER — Other Ambulatory Visit: Payer: Self-pay | Admitting: Family Medicine

## 2024-02-04 VITALS — BP 128/82 | HR 90 | Temp 98.3°F | Resp 14 | Ht 68.31 in | Wt 200.0 lb

## 2024-02-04 DIAGNOSIS — Z91018 Allergy to other foods: Secondary | ICD-10-CM

## 2024-02-04 DIAGNOSIS — J3089 Other allergic rhinitis: Secondary | ICD-10-CM

## 2024-02-04 DIAGNOSIS — J4540 Moderate persistent asthma, uncomplicated: Secondary | ICD-10-CM

## 2024-02-04 DIAGNOSIS — J302 Other seasonal allergic rhinitis: Secondary | ICD-10-CM | POA: Diagnosis not present

## 2024-02-04 MED ORDER — LORATADINE 10 MG PO TABS: 10.0000 mg | ORAL_TABLET | Freq: Every day | ORAL | 1 refills | Status: DC | PRN

## 2024-02-04 MED ORDER — MONTELUKAST SODIUM 10 MG PO TABS
10.0000 mg | ORAL_TABLET | Freq: Every day | ORAL | 1 refills | Status: DC
Start: 2024-02-04 — End: 2024-08-11

## 2024-02-04 MED ORDER — ALBUTEROL SULFATE HFA 108 (90 BASE) MCG/ACT IN AERS: 1.0000 | INHALATION_SPRAY | Freq: Four times a day (QID) | RESPIRATORY_TRACT | 1 refills | Status: DC | PRN

## 2024-02-04 MED ORDER — TRELEGY ELLIPTA 100-62.5-25 MCG/ACT IN AEPB: 1.0000 | INHALATION_SPRAY | Freq: Every day | RESPIRATORY_TRACT | 5 refills | Status: DC

## 2024-02-04 MED ORDER — MONTELUKAST SODIUM 10 MG PO TABS: 10.0000 mg | ORAL_TABLET | Freq: Every day | ORAL | 1 refills | Status: DC

## 2024-02-04 MED ORDER — AZELASTINE HCL 137 MCG/SPRAY NA SOLN: 2.0000 | Freq: Two times a day (BID) | NASAL | 5 refills | Status: DC | PRN

## 2024-02-04 MED ORDER — XHANCE 93 MCG/ACT NA EXHU: 1.0000 | INHALANT_SUSPENSION | Freq: Two times a day (BID) | NASAL | 5 refills | Status: DC

## 2024-02-04 NOTE — Patient Instructions (Addendum)
 Allergic rhinitis Continue allergen avoidance measures directed toward mold, dust mite, and cockroach. Continue Claritin  10 mg once a day as needed for runny nose or itch Continue montelukast  10 mg once a day.  Continue Azelastine  2 sprays in each nostril twice a day as needed for runny nose or nasal symptoms Continue Xhance  1-2 spray in each nostril twice a day as needed for stuffy nose. If not covered, start Nasonex or Nasacort 2 sprays each nostril daily.   Consider saline nasal rinses as needed for nasal symptoms. Use this before any medicated nasal sprays for best result If your symptoms are not well controlled with the treatment plan as listed above, consider allergen immunotherapy  Moderate Persistent Asthma:  Continue Trelegy 100-1 puff once a day. Continue montelukast  10 mg once a day. Continue albuterol  2 puffs once every 4-6 hours as needed for cough or wheeze You may use albuterol  2 puffs 5 to 15 minutes before activity to decrease cough or wheeze  History of Food Allergy :  - You are eating pecans without issues, keep eating them, you are not allergic.

## 2024-02-04 NOTE — Progress Notes (Signed)
 FOLLOW UP Date of Service/Encounter:  02/04/24   Subjective:  Allison Hart (DOB: [birth year]) is a 63 y.o. female who [facility] for follow up for asthma/shortness of breath, allergic rhinitis and food allergies.  History obtained from: chart review and patient. Last visit was with Marinus Sic 06/06/2023 and at the time asthma and allergies well-controlled on Trelegy, Xhance , Singulair , azelastine .  Was avoiding pecans at the time of her skin testing and blood testing were both negative.  Discussed reintroduction.   Reports asthma has done well.  Not much trouble with shortness of breath or wheezing.  No ER visits, urgent care visits or oral prednisone  use since last visit.  She uses her Trelegy daily and does not need albuterol  much for rescue.  Allergy  symptoms have been on and off with congestion and rhinorrhea.  She is taking montelukast , Xhance  and as needed Claritin /Azelastine .  She has had some on and off trouble getting the Xhance  approved.  She has used Flonase  in the past without improvement.  Eatings pecans without issues.  Past Medical History: Past Medical History:  Diagnosis Date   Anxiety    Arthritis    Essential hypertension, benign    GERD (gastroesophageal reflux disease)    Herpes    History of kidney stones    Murmur    Obesity    Primary biliary cholangitis (HCC)    Sinusitis    Sleep apnea    Urinary frequency     Objective:  BP 128/82   Pulse 90   Temp 98.3 F (36.8 C)   Resp 14   Ht 5' 8.31" (1.735 m)   Wt 200 lb (90.7 kg)   LMP  (LMP Unknown)   SpO2 99%   BMI 30.14 kg/m  Body mass index is 30.14 kg/m. Physical Exam: GEN: alert, well developed HEENT: clear conjunctiva, nose with mild inferior turbinate hypertrophy, pink nasal mucosa, clear rhinorrhea, slight cobblestoning HEART: regular rate and rhythm, no murmur LUNGS: clear to auscultation bilaterally, no coughing, unlabored  respiration SKIN: no rashes or lesions  Spirometry:  Tracings reviewed. Her effort: Good reproducible efforts. FVC: 2.31 L, 75% predicted  FEV1: 1.61 L, 67% predicted FEV1/FVC ratio: 70% Interpretation: Spirometry consistent with normal pattern.  Please see scanned spirometry results for details.   Assessment:   1. Seasonal and perennial allergic rhinitis   2. History of food allergy    3. Moderate persistent asthma without complication     Plan/Recommendations:   Allergic Rhinitis  - Controlled  SPT 07/2024: positive to mold, dust mite, and cockroach. Continue Claritin  10 mg once a day as needed for runny nose or itch Continue montelukast  10 mg once a day.  Continue Azelastine  2 sprays in each nostril twice a day as needed for runny nose or nasal symptoms Continue Xhance  1-2 spray in each nostril twice a day as needed for stuffy nose. If not covered, start Nasonex or Nasacort 2 sprays each nostril daily.   Consider saline nasal rinses as needed for nasal symptoms. Use this before any medicated nasal sprays for best result If your symptoms are not well controlled with the treatment plan as listed above, consider allergen immunotherapy  Moderate Persistent Asthma:  - Well controlled with normal spirometry. MDI technique discussed  Continue Trelegy 100-62.5-25mcg 1 puff once a day. Continue montelukast  10 mg once a day. Continue albuterol  2 puffs once every 4-6 hours as needed for cough or wheeze You may use albuterol  2  puffs 5 to 15 minutes before activity to decrease cough or wheeze  History of Food Allergy :  - You are eating pecans without issues, keep eating them, you are not allergic.       Return in about 6 months (around 08/05/2024).  Kristen Petri, MD [facility] 

## 2024-02-05 ENCOUNTER — Ambulatory Visit (INDEPENDENT_AMBULATORY_CARE_PROVIDER_SITE_OTHER): Admitting: Internal Medicine

## 2024-02-05 ENCOUNTER — Encounter: Payer: Self-pay | Admitting: Internal Medicine

## 2024-02-05 VITALS — BP 126/83 | HR 97 | Temp 98.2°F | Ht 68.5 in | Wt 199.6 lb

## 2024-02-05 DIAGNOSIS — K219 Gastro-esophageal reflux disease without esophagitis: Secondary | ICD-10-CM

## 2024-02-05 DIAGNOSIS — R7401 Elevation of levels of liver transaminase levels: Secondary | ICD-10-CM

## 2024-02-05 DIAGNOSIS — K743 Primary biliary cirrhosis: Secondary | ICD-10-CM

## 2024-02-05 DIAGNOSIS — R748 Abnormal levels of other serum enzymes: Secondary | ICD-10-CM

## 2024-02-05 DIAGNOSIS — R768 Other specified abnormal immunological findings in serum: Secondary | ICD-10-CM | POA: Diagnosis not present

## 2024-02-05 NOTE — Patient Instructions (Signed)
 It was nice to see you again today!  Based on your current labs, you have primary biliary cholangitis and not any other form of autoimmune liver disease  Your alkaline phosphatase has not come down with Urso  alone based on prior labs.  We will obtain current labs at this time.  If alkaline phosphatase remains elevated, we will add Ocaliva to your regimen as discussed  Make sure you are taking a fat soluble vitamin supplement (containing vitamins DEA and K)  C-Met and INR today  Further recommendations to follow.

## 2024-02-05 NOTE — Progress Notes (Signed)
 Primary Care Physician:  Towanda Fret, MD Primary Gastroenterologist:  Dr. Riley Cheadle  Pre-Procedure History & Physical: HPI:  Allison Hart is a 63 y.o. female here for with elevated alkaline phosphatase and positive AMA.  Previously IgG was elevated patient also had a positive ANA and elevated aminotransferases.  More recently aminotransferases has completely normalized.  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  She is currently on appropriately weight dosed Urso .  Not really having any pruritus or any other issues.  Talked about PBC in the treatment spectrum.  Our goal nowadays is to actually as much as possible to completely normalize her LFTs including alkaline phosphatase.  She had her second knee replacement done in March and is getting along fairly well.  Her last set of LFTs was back in January.  Reflux well-controlled on PPI.  Past Medical History:  Diagnosis Date   Anxiety    Arthritis    Essential hypertension, benign    GERD (gastroesophageal reflux disease)    Herpes    History of kidney stones    Murmur    Obesity    Primary biliary cholangitis (HCC)    Sinusitis    Sleep apnea    Urinary frequency     Past Surgical History:  Procedure Laterality Date   BIOPSY  08/29/2018   Procedure: BIOPSY;  Surgeon: Suzette Espy, MD;  [facility]: [facility];  Service: Endoscopy;;  gastric   COLONOSCOPY  11/13/2012   Procedure: COLONOSCOPY;  Surgeon: Ruby Corporal, MD;  [facility]: [facility];  Service: Endoscopy;  Laterality: N/A;  930   COLONOSCOPY WITH PROPOFOL  N/A 12/18/2022   Procedure: COLONOSCOPY WITH PROPOFOL ;  Surgeon: Suzette Espy, MD;  [facility]: [facility];  Service: Endoscopy;  Laterality: N/A;  7:30 am   embolization of uterine artery X 2     ESOPHAGOGASTRODUODENOSCOPY (EGD) WITH PROPOFOL  N/A 08/29/2018   Procedure: ESOPHAGOGASTRODUODENOSCOPY (EGD) WITH PROPOFOL ;  Surgeon: Suzette Espy, MD;   [facility];  Service: Endoscopy;  Laterality: N/A;  3:00pm   INGUINAL LYMPH NODE BIOPSY Right 04/14/2016   Procedure: RIGHT INGUINAL LYMPH NODE BIOPSY;  Surgeon: Alanda Allegra, MD;  Location: [facility];  Service: General;  Laterality: Right;   IR ANGIO INTRA EXTRACRAN SEL COM CAROTID INNOMINATE BILAT MOD SED  02/12/2017   IR ANGIO VERTEBRAL SEL VERTEBRAL BILAT MOD SED  02/12/2017   IR GENERIC HISTORICAL  04/27/2016   IR RADIOLOGIST EVAL & MGMT 04/27/2016 Fernando Hoyer, MD [facility]   IR GENERIC HISTORICAL  12/05/2016   IR RADIOLOGIST EVAL & MGMT 12/05/2016 Fernando Hoyer, MD [facility]   IR RADIOLOGIST EVAL & MGMT  01/22/2017   IR RADIOLOGIST EVAL & MGMT  08/06/2018   IR RADIOLOGIST EVAL & MGMT  08/19/2020   IR RADIOLOGIST EVAL & MGMT  09/07/2021   Left inguinal hernia repair and endometriosis  2007   No definitive records    Left parotid bx benign  2005   RADIOLOGY WITH ANESTHESIA N/A 01/18/2016   Procedure: RADIOLOGY WITH ANESTHESIA;  Surgeon: Luellen Sages, MD;  Location: [facility];  Service: Radiology;  Laterality: N/A;   RADIOLOGY WITH ANESTHESIA N/A 02/12/2017   Procedure: Nathaneil Bakes EMBOLIZATION;  Surgeon: Luellen Sages, MD;  Location: [facility];  Service: Radiology;  Laterality: N/A;   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 Authorizing Provider  acyclovir  (ZOVIRAX ) 400 MG tablet Take 1 tablet (400 mg total) by mouth 2 (two) times daily. Patient taking differently: Take 400 mg by mouth daily as needed. 07/22/22  Yes Towanda Fret, MD  albuterol  (VENTOLIN  HFA) 108 [phone] Base) MCG/ACT inhaler Inhale 1-2 puffs into the lungs every 6 (six) hours as needed for wheezing or shortness of breath. 02/04/24  Yes Kandice Orleans, MD  amlodipine -olmesartan  (AZOR ) 10-20 MG tablet Take 1 tablet by mouth once daily 01/10/24  Yes Towanda Fret, MD   aspirin  EC 81 MG tablet Take 1 tablet (81 mg total) by mouth daily. 07/30/18  Yes Towanda Fret, MD  Azelastine  HCl 137 MCG/SPRAY SOLN Place 2 sprays into the nose 2 (two) times daily as needed (allergies). 02/04/24  Yes Kandice Orleans, MD  cholecalciferol (VITAMIN D ) 1000 units tablet Take 1,000 Units by mouth daily.   Yes [provider]  Cyanocobalamin (VITAMIN B-12 PO) Take 1 tablet by mouth daily.   Yes [provider]  Fluticasone  Propionate (XHANCE ) 93 MCG/ACT EXHU Place 1 Inhalation into the nose in the morning and at bedtime. 02/04/24  Yes Patel, Ammie Bale, MD  Fluticasone -Umeclidin-Vilant (TRELEGY ELLIPTA ) 100-62.5-25 MCG/ACT AEPB Inhale 1 puff into the lungs daily. 02/04/24  Yes Kandice Orleans, MD  furosemide  (LASIX ) 40 MG tablet Take 1 tablet by mouth once daily 01/25/24  Yes Towanda Fret, MD  KLOR-CON  M20 20 MEQ tablet Take 1 tablet by mouth twice daily 01/29/24  Yes Towanda Fret, MD  Lidocaine  5 % CREA Knee pain/and right hip pain 02/09/23  Yes [provider]  loratadine  (CLARITIN ) 10 MG tablet Take 1 tablet (10 mg total) by mouth daily as needed for allergies. 02/04/24  Yes Kandice Orleans, MD  montelukast  (SINGULAIR ) 10 MG tablet Take 1 tablet (10 mg total) by mouth at bedtime. 02/04/24  Yes Kandice Orleans, MD  omeprazole  (PRILOSEC) 40 MG capsule Take 1 capsule by mouth once daily 12/13/23  Yes Simpson, Margaret E, MD  Pyridoxine HCl (VITAMIN B-6 PO) Take 1 tablet by mouth daily.   Yes [provider]  spironolactone  (ALDACTONE ) 25 MG tablet Take 1 tablet by mouth once daily 01/25/24  Yes Simpson, Margaret E, MD  trolamine salicylate (ASPERCREME) 10 % cream Apply 1 Application topically as needed (Knee pain).   Yes [provider]  ursodiol  (ACTIGALL ) 300 MG capsule TAKE 2 CAPSULES BY MOUTH TWICE DAILY WITH A MEAL 08/20/23  Yes Lanney Pitts, PA-C    Allergies as of 02/05/2024 - Review Complete 02/05/2024  Allergen Reaction  Noted   Versed  [midazolam ] Shortness Of Breath and Other (See Comments) 01/18/2016   Misc. sulfonamide containing compounds Itching 03/14/2023   Sulfa  antibiotics Other (See Comments) 08/02/2018    Family History  Problem Relation Age of Onset   Hypertension Mother    Hyperlipidemia Mother    Thyroid disease Mother    Coronary artery disease Mother    Irregular heart beat Mother    Asthma Sister    Fibroids Sister    Hypertension Sister    Asthma Maternal Grandfather    Ulcerative colitis Son    Asthma Niece    Colon cancer Neg Hx     Social History   Socioeconomic History   Marital status: Married    Spouse name: Not on file   Number of children: Not on file   Years of education: Not on file  Highest education level: Associate degree: occupational, Scientist, product/process development, or vocational program  Occupational History   Not on file  Tobacco Use   Smoking status: Former    Current packs/day: 0.00    Average packs/day: 0.3 packs/day for 5.0 years (1.3 ttl pk-yrs)    Types: Cigarettes    Start date: 18    Quit date: 46    Years since quitting: 28.3   Smokeless tobacco: Never  Vaping Use   Vaping status: Never Used  Substance and Sexual Activity   Alcohol use: Not Currently    Comment: rare   Drug use: No   Sexual activity: Yes    Birth control/protection: None  Other Topics Concern   Not on file  Social History Narrative   Not on file   Social Drivers of Health   Financial Resource Strain: Not on file  Food Insecurity: Not on file  Transportation Needs: No Transportation Needs (07/15/2019)   PRAPARE - Administrator, Civil Service (Medical): No    Lack of Transportation (Non-Medical): No  Physical Activity: Not on file  Stress: Not on file  Social Connections: Not on file  Intimate Partner Violence: Not on file    Review of Systems: See HPI, otherwise negative ROS  Physical Exam: BP 126/83 (BP Location: Right Arm, Patient Position: Sitting, Cuff  Size: Normal)   Pulse 97   Temp 98.2 F (36.8 C) (Oral)   Ht 5' 8.5" (1.74 m)   Wt 199 lb 9.6 oz (90.5 kg)   LMP  (LMP Unknown)   BMI 29.91 kg/m  General:   Alert,  Well-developed, well-nourished, pleasant and cooperative in NAD Skin: No cutaneous stigmata of chronic liver disease.   Eyes:  Sclera clear, no icterus.   Conjunctiva pink. Ears:  Normal auditory acuity. Nose:  No deformity, discharge,  or lesions. Mouth:  No deformity or lesions. Neck:  Supple; no masses or thyromegaly. No significant cervical adenopathy. Lungs:  Clear throughout to auscultation.   No wheezes, crackles, or rhonchi. No acute distress. Heart:  Regular rate and rhythm; no murmurs, clicks, rubs,  or gallops. Abdomen: Non-distended, normal bowel sounds.  Soft and nontender without appreciable mass or hepatosplenomegaly.   Impression/Plan: 63 year old lady with elevated alkaline phosphatase and positive antimitochondrial antibody consistent with PBC.  Although she has a history of elevated IgG and a positive ANA previously elevated aminotransferases; more recently, hepatic function indicates isolated elevated alkaline phosphatase.  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  I do not feel liver biopsy is necessary.  She needs to be treated more aggressively for PBC.  Although her alkaline phosphatase is no more than 1.67 above the upper limit of normal, it is best practice to shoot for a completely normal alkaline phosphatase.  Discussed the addition of Ocaliva.  This agent has a different mechanism of action and may facilitate normalization of alkaline phosphatase which is our goal.  Discussed the common side effects of Ocaliva with the patient.  Recommendations:  GERD well-controlled on omeprazole   updated c-Met, INR  If alkaline phosphatase remains elevated, we will start Ocaliva at a low dose of 5 mg daily  She should continue taking a fat-soluble vitamin supplement (vitamin DEA and K)     Notice:  This dictation was prepared with Dragon dictation along with smaller phrase technology. Any transcriptional errors that result from this process are unintentional and may not be corrected upon review.

## 2024-02-06 LAB — COMPREHENSIVE METABOLIC PANEL WITH GFR
ALT: 16 IU/L (ref 0–32)
AST: 19 IU/L (ref 0–40)
Albumin: 4.5 g/dL (ref 3.9–4.9)
Alkaline Phosphatase: 144 IU/L — ABNORMAL HIGH (ref 44–121)
BUN/Creatinine Ratio: 16 (ref 12–28)
BUN: 14 mg/dL (ref 8–27)
Bilirubin Total: 0.6 mg/dL (ref 0.0–1.2)
CO2: 24 mmol/L (ref 20–29)
Calcium: 9.8 mg/dL (ref 8.7–10.3)
Chloride: 101 mmol/L (ref 96–106)
Creatinine, Ser: 0.9 mg/dL (ref 0.57–1.00)
Globulin, Total: 3.4 g/dL (ref 1.5–4.5)
Glucose: 85 mg/dL (ref 70–99)
Potassium: 4.6 mmol/L (ref 3.5–5.2)
Sodium: 142 mmol/L (ref 134–144)
Total Protein: 7.9 g/dL (ref 6.0–8.5)
eGFR: 73 mL/min/{1.73_m2} (ref >59)

## 2024-02-06 LAB — PROTIME-INR
INR: 1.1 (ref 0.9–1.2)
Prothrombin Time: 11.8 s (ref 9.1–12.0)

## 2024-02-11 ENCOUNTER — Other Ambulatory Visit: Payer: Self-pay

## 2024-02-11 DIAGNOSIS — K743 Primary biliary cirrhosis: Secondary | ICD-10-CM

## 2024-02-11 MED ORDER — OCALIVA 5 MG PO TABS: 5.0000 mg | ORAL_TABLET | Freq: Every day | ORAL | 11 refills | Status: DC

## 2024-02-14 ENCOUNTER — Other Ambulatory Visit: Payer: Self-pay | Admitting: Gastroenterology

## 2024-02-19 ENCOUNTER — Other Ambulatory Visit: Payer: Self-pay

## 2024-02-19 MED ORDER — OCALIVA 5 MG PO TABS: 5.0000 mg | ORAL_TABLET | Freq: Every day | ORAL | 11 refills | Status: DC

## 2024-02-21 ENCOUNTER — Encounter: Payer: Self-pay | Admitting: Family Medicine

## 2024-02-21 ENCOUNTER — Encounter: Payer: Self-pay | Admitting: Internal Medicine

## 2024-02-28 ENCOUNTER — Ambulatory Visit: Payer: Commercial Managed Care - PPO | Admitting: Family Medicine

## 2024-02-28 ENCOUNTER — Encounter: Payer: Self-pay | Admitting: Internal Medicine

## 2024-03-04 ENCOUNTER — Telehealth: Payer: Self-pay

## 2024-03-04 DIAGNOSIS — K743 Primary biliary cirrhosis: Secondary | ICD-10-CM

## 2024-03-04 DIAGNOSIS — R7989 Other specified abnormal findings of blood chemistry: Secondary | ICD-10-CM

## 2024-03-04 NOTE — Telephone Encounter (Signed)
 Pt's insurance company is denying Ocaliva . They are stating that there is not a definitive diagnosis of PBC according to records/labs/imaging. They are requiring hepatobiliary imaging results reports confirming the absence of extrahepatic biliary obstruction, and either a pathology report documenting histologic evidence of PBC or lab reports with pretreatment alkaline phosphatase labs at least 1.5 times the upper limit of normal for further review. All labs, imaging and ov notes available in [employer] have been faxed to the insurance company and have not been sufficient enough for approval. Please advise.

## 2024-03-05 NOTE — Telephone Encounter (Signed)
 noted

## 2024-03-06 NOTE — Addendum Note (Signed)
 Addended by: Feliz Hosteller on: 03/06/2024 10:30 AM   Modules accepted: Orders

## 2024-03-06 NOTE — Telephone Encounter (Signed)
 Allison Hart/Allison Hart R: please arrange

## 2024-03-10 ENCOUNTER — Other Ambulatory Visit: Payer: Self-pay

## 2024-03-10 DIAGNOSIS — K743 Primary biliary cirrhosis: Secondary | ICD-10-CM

## 2024-03-10 NOTE — Telephone Encounter (Signed)
 Pt was made aware and verbalized understanding.

## 2024-03-13 ENCOUNTER — Ambulatory Visit (HOSPITAL_COMMUNITY)
Admission: RE | Admit: 2024-03-13 | Discharge: 2024-03-13 | Disposition: A | Discharge status: Home / Self Care | Source: Ambulatory Visit | Attending: Internal Medicine | Admitting: Internal Medicine

## 2024-03-13 ENCOUNTER — Encounter: Payer: Self-pay | Admitting: Family Medicine

## 2024-03-13 ENCOUNTER — Ambulatory Visit (INDEPENDENT_AMBULATORY_CARE_PROVIDER_SITE_OTHER): Admitting: Family Medicine

## 2024-03-13 VITALS — BP 138/84 | HR 77 | Resp 16 | Ht 69.0 in | Wt 209.0 lb

## 2024-03-13 DIAGNOSIS — R7989 Other specified abnormal findings of blood chemistry: Secondary | ICD-10-CM | POA: Insufficient documentation

## 2024-03-13 DIAGNOSIS — E785 Hyperlipidemia, unspecified: Secondary | ICD-10-CM

## 2024-03-13 DIAGNOSIS — I071 Rheumatic tricuspid insufficiency: Secondary | ICD-10-CM

## 2024-03-13 DIAGNOSIS — K743 Primary biliary cirrhosis: Secondary | ICD-10-CM | POA: Diagnosis present

## 2024-03-13 DIAGNOSIS — E663 Overweight: Secondary | ICD-10-CM | POA: Diagnosis not present

## 2024-03-13 DIAGNOSIS — I10 Essential (primary) hypertension: Secondary | ICD-10-CM

## 2024-03-13 NOTE — Patient Instructions (Addendum)
 Annual exam mid to end  October, call if you need me sooner  Fasting lipid, cmp and eGFR 3 to 5 days before October visit  Thankful recovery from knee surgery is good  Increase veges and fruit and reduce sugar and white foods  Thanks for choosing [facility], we consider it a privelige to serve you.

## 2024-03-14 ENCOUNTER — Other Ambulatory Visit: Payer: Self-pay | Admitting: Family Medicine

## 2024-03-16 ENCOUNTER — Encounter: Payer: Self-pay | Admitting: Family Medicine

## 2024-03-16 NOTE — Assessment & Plan Note (Signed)
 Currently on ursodiol  by GI and attempts being made to get another med ob board

## 2024-03-16 NOTE — Assessment & Plan Note (Signed)
 Hyperlipidemia:Low fat diet discussed and encouraged.   Lipid Panel  Lab Results  Component Value Date   CHOL 206 (H) 10/24/2023   HDL 48 10/24/2023   LDLCALC 142 (H) 10/24/2023   TRIG 89 10/24/2023   CHOLHDL 4.3 10/24/2023   Not at goal Updated lab needed at/ before next visit.

## 2024-03-16 NOTE — Assessment & Plan Note (Signed)
Needs cardiology follow up 

## 2024-03-16 NOTE — Assessment & Plan Note (Signed)
  Patient re-educated about  the importance of commitment to a  minimum of 150 minutes of exercise per week as able.  The importance of healthy food choices with portion control discussed, as well as eating regularly and within a 12 hour window most days. The need to choose "clean , green" food 50 to 75% of the time is discussed, as well as to make water  the primary drink and set a goal of 64 ounces water  daily.       03/13/2024    2:14 PM 02/05/2024    9:08 AM 02/04/2024    2:15 PM  Weight /BMI  Weight 209 lb 199 lb 9.6 oz 200 lb  Height 5\' 9"  (1.753 m) 5' 8.5" (1.74 m) 5' 8.31" (1.735 m)  BMI 30.86 kg/m2 29.91 kg/m2 30.14 kg/m2    Deteriorated will work on food chjoice , no medicatio to be prescribed

## 2024-03-16 NOTE — Progress Notes (Signed)
 Allison Hart     MRN: [id number]      DOB: 1961-08-28  Chief Complaint  Patient presents with   Hypertension    4 month follow up     HPI Allison Hart is here for follow up and re-evaluation of chronic medical conditions, medication management and review of any available recent lab and radiology data.  Preventive health is updated, specifically  Cancer screening and Immunization.   Has had knee replacement successfully with no complications and is back at work The PT denies any adverse reactions to current medications since the last visit.  C/o weight gain but will work on food choice ROS Denies recent fever or chills. Denies sinus pressure, nasal congestion, ear pain or sore throat. Denies chest congestion, productive cough or wheezing. Denies chest pains, palpitations and leg swelling Denies abdominal pain, nausea, vomiting,diarrhea or constipation.   Denies dysuria, frequency, hesitancy or incontinence. Denies  uncontrolled joint pain, swelling and limitation in mobility. Denies headaches, seizures, numbness, or tingling. Denies depression, anxiety or insomnia. Denies skin break down or rash.   PE  BP 138/84   Pulse 77   Resp 16   Ht 5\' 9"  (1.753 m)   Wt 209 lb (94.8 kg)   LMP  (LMP Unknown)   SpO2 98%   BMI 30.86 kg/m   Patient alert and oriented and in no cardiopulmonary distress.  HEENT: No facial asymmetry, EOMI,     Neck supple .  Chest: Clear to auscultation bilaterally.  CVS: S1, S2 no murmurs, no S3.Regular rate.  ABD: Soft non tender.   Ext: No edema  MS: Adequate ROM spine, shoulders, hips an reduced in  knees.  Skin: Intact, no ulcerations or rash noted.  Psych: Good eye contact, normal affect. Memory intact not anxious or depressed appearing.  CNS: CN 2-12 intact, power,  normal throughout.no focal deficits noted.   Assessment & Plan  Moderate tricuspid regurgitation by prior echocardiogram Needs cardiology follow up  Hyperlipidemia  LDL goal <100 Hyperlipidemia:Low fat diet discussed and encouraged.   Lipid Panel  Lab Results  Component Value Date   CHOL 206 (H) 10/24/2023   HDL 48 10/24/2023   LDLCALC 142 (H) 10/24/2023   TRIG 89 10/24/2023   CHOLHDL 4.3 10/24/2023   Not at goal Updated lab needed at/ before next visit.     Essential hypertension Controlled, no change in medication DASH diet and commitment to daily physical activity for a minimum of 30 minutes discussed and encouraged, as a part of hypertension management. The importance of attaining a healthy weight is also discussed.     03/13/2024    2:41 PM 03/13/2024    2:14 PM 02/05/2024    9:08 AM 02/04/2024    2:15 PM 10/30/2023    4:10 PM 10/30/2023    1:51 PM 08/07/2023   11:13 AM  BP/Weight  Systolic BP 138 148 126 128 116 133 124  Diastolic BP 84 84 83 82 82 87 72  Wt. (Lbs)  209 199.6 200 210 207 202.2  BMI  30.86 kg/m2 29.91 kg/m2 30.14 kg/m2 31.01 kg/m2 30.57 kg/m2 29.86 kg/m2       Overweight (BMI 25.0-29.9)  Patient re-educated about  the importance of commitment to a  minimum of 150 minutes of exercise per week as able.  The importance of healthy food choices with portion control discussed, as well as eating regularly and within a 12 hour window most days. The need to choose "clean , green" food  50 to 75% of the time is discussed, as well as to make water  the primary drink and set a goal of 64 ounces water  daily.       03/13/2024    2:14 PM 02/05/2024    9:08 AM 02/04/2024    2:15 PM  Weight /BMI  Weight 209 lb 199 lb 9.6 oz 200 lb  Height 5\' 9"  (1.753 m) 5' 8.5" (1.74 m) 5' 8.31" (1.735 m)  BMI 30.86 kg/m2 29.91 kg/m2 30.14 kg/m2    Deteriorated will work on food chjoice , no medicatio to be prescribed  Primary biliary cholangitis (HCC) Currently on ursodiol  by GI and attempts being made to get another med ob board

## 2024-03-16 NOTE — Assessment & Plan Note (Signed)
 Controlled, no change in medication DASH diet and commitment to daily physical activity for a minimum of 30 minutes discussed and encouraged, as a part of hypertension management. The importance of attaining a healthy weight is also discussed.     03/13/2024    2:41 PM 03/13/2024    2:14 PM 02/05/2024    9:08 AM 02/04/2024    2:15 PM 10/30/2023    4:10 PM 10/30/2023    1:51 PM 08/07/2023   11:13 AM  BP/Weight  Systolic BP 138 148 126 128 116 133 124  Diastolic BP 84 84 83 82 82 87 72  Wt. (Lbs)  209 199.6 200 210 207 202.2  BMI  30.86 kg/m2 29.91 kg/m2 30.14 kg/m2 31.01 kg/m2 30.57 kg/m2 29.86 kg/m2

## 2024-03-19 ENCOUNTER — Ambulatory Visit: Payer: Self-pay | Admitting: Internal Medicine

## 2024-03-19 ENCOUNTER — Other Ambulatory Visit: Payer: Self-pay

## 2024-03-19 DIAGNOSIS — K743 Primary biliary cirrhosis: Secondary | ICD-10-CM

## 2024-03-19 DIAGNOSIS — R7989 Other specified abnormal findings of blood chemistry: Secondary | ICD-10-CM

## 2024-03-19 NOTE — Progress Notes (Signed)
 Noted

## 2024-03-20 LAB — COMPREHENSIVE METABOLIC PANEL WITH GFR
ALT: 13 IU/L (ref 0–32)
AST: 19 IU/L (ref 0–40)
Albumin: 4.4 g/dL (ref 3.9–4.9)
Alkaline Phosphatase: 139 IU/L — ABNORMAL HIGH (ref 44–121)
BUN/Creatinine Ratio: 19 (ref 12–28)
BUN: 19 mg/dL (ref 8–27)
Bilirubin Total: 0.5 mg/dL (ref 0.0–1.2)
CO2: 24 mmol/L (ref 20–29)
Calcium: 9.5 mg/dL (ref 8.7–10.3)
Chloride: 103 mmol/L (ref 96–106)
Creatinine, Ser: 1 mg/dL (ref 0.57–1.00)
Globulin, Total: 3.3 g/dL (ref 1.5–4.5)
Glucose: 72 mg/dL (ref 70–99)
Potassium: 4.7 mmol/L (ref 3.5–5.2)
Sodium: 145 mmol/L — ABNORMAL HIGH (ref 134–144)
Total Protein: 7.7 g/dL (ref 6.0–8.5)
eGFR: 64 mL/min/{1.73_m2} (ref >59)

## 2024-03-21 ENCOUNTER — Encounter: Payer: Self-pay | Admitting: Internal Medicine

## 2024-04-01 ENCOUNTER — Ambulatory Visit: Payer: Self-pay | Admitting: Internal Medicine

## 2024-04-04 ENCOUNTER — Other Ambulatory Visit: Payer: Self-pay | Admitting: Family Medicine

## 2024-04-24 ENCOUNTER — Other Ambulatory Visit: Payer: Self-pay | Admitting: Family Medicine

## 2024-04-24 ENCOUNTER — Other Ambulatory Visit: Payer: Self-pay | Admitting: Interventional Radiology

## 2024-04-24 DIAGNOSIS — I722 Aneurysm of renal artery: Secondary | ICD-10-CM

## 2024-04-26 ENCOUNTER — Other Ambulatory Visit: Payer: Self-pay | Admitting: Family Medicine

## 2024-04-27 ENCOUNTER — Other Ambulatory Visit: Payer: Self-pay | Admitting: Family Medicine

## 2024-04-28 ENCOUNTER — Ambulatory Visit
Admission: RE | Admit: 2024-04-28 | Discharge: 2024-04-28 | Disposition: A | Discharge status: Home / Self Care | Source: Ambulatory Visit | Attending: Interventional Radiology | Admitting: Interventional Radiology

## 2024-04-28 DIAGNOSIS — I722 Aneurysm of renal artery: Secondary | ICD-10-CM

## 2024-04-28 MED ORDER — IOPAMIDOL (ISOVUE-300) INJECTION 61%
100.0000 mL | Freq: Once | INTRAVENOUS | Status: AC | PRN
  Administered 2024-04-28: 100 mL via INTRAVENOUS

## 2024-05-01 NOTE — Progress Notes (Signed)
 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 This was incidentally discovered in 2017. She was referred to Interventional Radiology and first met with Dr. Karalee 12/22/15. They discussed treatment/management options and the patient ultimately underwent endovascular repair with pipeline flow diverting stent x 2 on 01/18/16.  She has periodically followed up with Dr. Karalee since then and has done well. Surveillance imaging has demonstrated no recurrent arterialization or evidence of growth over time. She last followed up with Dr. Karalee 09/07/21 and given how well she had been doing they discussed a less intense follow up regimen. They made plans for repeat imaging with clinic follow up in two years.   A CTA abdomen was completed 04/28/24 and she presents to the clinic today for follow up. She is in her usual state of health and has no complaints at this time.   Past Medical History:  Diagnosis Date   Anxiety    Arthritis    Essential hypertension, benign    GERD (gastroesophageal reflux disease)    Herpes    History of kidney stones    Murmur    Obesity    Primary biliary cholangitis (HCC)    Sinusitis    Sleep apnea    Urinary frequency     Past Surgical History:  Procedure Laterality Date   BIOPSY  08/29/2018   Procedure: BIOPSY;  Surgeon: Shaaron Lamar HERO, MD;  [facility]: [facility];  Service: Endoscopy;;  gastric   COLONOSCOPY  11/13/2012   Procedure: COLONOSCOPY;  Surgeon: Claudis RAYMOND Rivet, MD;  [facility]: [facility];  Service:  Endoscopy;  Laterality: N/A;  930   COLONOSCOPY WITH PROPOFOL  N/A 12/18/2022   Procedure: COLONOSCOPY WITH PROPOFOL ;  Surgeon: Shaaron Lamar HERO, MD;  [facility]: [facility];  Service: Endoscopy;  Laterality: N/A;  7:30 am   embolization of uterine artery X 2     ESOPHAGOGASTRODUODENOSCOPY (EGD) WITH PROPOFOL  N/A 08/29/2018   Procedure: ESOPHAGOGASTRODUODENOSCOPY (EGD) WITH PROPOFOL ;  Surgeon: Shaaron Lamar HERO, MD;  [facility];  Service: Endoscopy;  Laterality: N/A;  3:00pm   INGUINAL LYMPH NODE BIOPSY Right 04/14/2016   Procedure: RIGHT INGUINAL LYMPH NODE BIOPSY;  Surgeon: Oneil Budge, MD;  Location: [facility];  Service: General;  Laterality: Right;   IR ANGIO INTRA EXTRACRAN SEL COM CAROTID INNOMINATE BILAT MOD SED  02/12/2017   IR ANGIO VERTEBRAL SEL VERTEBRAL BILAT MOD SED  02/12/2017   IR GENERIC HISTORICAL  04/27/2016   IR RADIOLOGIST EVAL & MGMT 04/27/2016 Beat Karalee, MD [facility]   IR GENERIC HISTORICAL  12/05/2016   IR RADIOLOGIST EVAL & MGMT 12/05/2016 Beat Karalee, MD [facility]   IR RADIOLOGIST EVAL & MGMT  01/22/2017   IR RADIOLOGIST EVAL & MGMT  08/06/2018   IR RADIOLOGIST EVAL & MGMT  08/19/2020   IR RADIOLOGIST EVAL & MGMT  09/07/2021   IR RADIOLOGIST EVAL & MGMT  05/02/2024   Left inguinal hernia repair and endometriosis  2007   No definitive records    Left parotid bx benign  2005   RADIOLOGY WITH ANESTHESIA N/A 01/18/2016   Procedure: RADIOLOGY WITH  ANESTHESIA;  Surgeon: Thyra Nash, MD;  Location: [facility];  Service: Radiology;  Laterality: N/A;   RADIOLOGY WITH ANESTHESIA N/A 02/12/2017   Procedure: AUREA EMBOLIZATION;  Surgeon: Nash Thyra, MD;  Location: [facility];  Service: Radiology;  Laterality: N/A;   renal artery aneurysm repair right Right 01/2016   UMBILICAL HERNIA REPAIR  infancy     Allergies: Versed  [midazolam ], Misc. sulfonamide containing compounds, and Sulfa  antibiotics  Medications: Prior to Admission medications    Medication Sig Start Date End Date Taking? Authorizing Provider  acyclovir  (ZOVIRAX ) 400 MG tablet Take 1 tablet (400 mg total) by mouth 2 (two) times daily. Patient taking differently: Take 400 mg by mouth daily as needed. 07/22/22   Antonetta Rollene BRAVO, MD  albuterol  (VENTOLIN  HFA) 108 [phone] Base) MCG/ACT inhaler Inhale 1-2 puffs into the lungs every 6 (six) hours as needed for wheezing or shortness of breath. 02/04/24   Tobie Arleta SQUIBB, MD  amlodipine -olmesartan  (AZOR ) 10-20 MG tablet Take 1 tablet by mouth once daily 04/04/24   Antonetta Rollene BRAVO, MD  aspirin  EC 81 MG tablet Take 1 tablet (81 mg total) by mouth daily. 07/30/18   Antonetta Rollene BRAVO, MD  Azelastine  HCl 137 MCG/SPRAY SOLN Place 2 sprays into the nose 2 (two) times daily as needed (allergies). 02/04/24   Tobie Arleta SQUIBB, MD  cholecalciferol (VITAMIN D ) 1000 units tablet Take 1,000 Units by mouth daily.    [provider]  Cyanocobalamin (VITAMIN B-12 PO) Take 1 tablet by mouth daily.    [provider]  Fluticasone  Propionate (XHANCE ) 93 MCG/ACT EXHU Place 1 Inhalation into the nose in the morning and at bedtime. 02/04/24   Tobie Arleta SQUIBB, MD  Fluticasone -Umeclidin-Vilant (TRELEGY ELLIPTA ) 100-62.5-25 MCG/ACT AEPB Inhale 1 puff into the lungs daily. 02/04/24   Tobie Arleta SQUIBB, MD  furosemide  (LASIX ) 40 MG tablet Take 1 tablet by mouth once daily 04/28/24   Antonetta Rollene BRAVO, MD  KLOR-CON  M20 20 MEQ tablet Take 1 tablet by mouth twice daily 04/28/24   Antonetta Rollene BRAVO, MD  Lidocaine  5 % CREA Knee pain/and right hip pain 02/09/23   [provider]  loratadine  (CLARITIN ) 10 MG tablet Take 1 tablet (10 mg total) by mouth daily as needed for allergies. 02/04/24   Tobie Arleta SQUIBB, MD  montelukast  (SINGULAIR ) 10 MG tablet Take 1 tablet (10 mg total) by mouth at bedtime. 02/04/24   Tobie Arleta SQUIBB, MD  Obeticholic Acid  (OCALIVA ) 5 MG TABS Take 5 mg by mouth daily. 02/19/24   Shaaron Lamar HERO, MD  omeprazole  (PRILOSEC) 40 MG  capsule Take 1 capsule by mouth once daily 04/24/24   Simpson, Margaret E, MD  Pyridoxine HCl (VITAMIN B-6 PO) Take 1 tablet by mouth daily.    [provider]  spironolactone  (ALDACTONE ) 25 MG tablet Take 1 tablet by mouth once daily 04/28/24   Simpson, Margaret E, MD  trolamine salicylate (ASPERCREME) 10 % cream Apply 1 Application topically as needed (Knee pain).    [provider]  ursodiol  (ACTIGALL ) 300 MG capsule TAKE 2 CAPSULES BY MOUTH TWICE DAILY WITH A MEAL 02/14/24   Rourk, Lamar HERO, MD     Family History  Problem Relation Age of Onset   Hypertension Mother    Hyperlipidemia Mother    Thyroid disease Mother    Coronary artery disease Mother    Irregular heart beat Mother    Asthma Sister    Fibroids Sister    Hypertension Sister    Asthma  Maternal Grandfather    Ulcerative colitis Son    Asthma Niece    Colon cancer Neg Hx     Social History   Socioeconomic History   Marital status: Married    Spouse name: Not on file   Number of children: Not on file   Years of education: Not on file   Highest education level: Associate degree: occupational, Scientist, product/process development, or vocational program  Occupational History   Not on file  Tobacco Use   Smoking status: Former    Current packs/day: 0.00    Average packs/day: 0.3 packs/day for 5.0 years (1.3 ttl pk-yrs)    Types: Cigarettes    Start date: 68    Quit date: 10    Years since quitting: 28.5   Smokeless tobacco: Never  Vaping Use   Vaping status: Never Used  Substance and Sexual Activity   Alcohol use: Not Currently    Comment: rare   Drug use: No   Sexual activity: Yes    Birth control/protection: None  Other Topics Concern   Not on file  Social History Narrative   Not on file   Social Drivers of Health   Financial Resource Strain: Not on file  Food Insecurity: Not on file  Transportation Needs: No Transportation Needs (07/15/2019)   PRAPARE - Administrator, Civil Service (Medical):  No    Lack of Transportation (Non-Medical): No  Physical Activity: Not on file  Stress: Not on file  Social Connections: Not on file     Vital Signs: BP (!) 133/90 (BP Location: Left Arm, Patient Position: Sitting, Cuff Size: Normal)   Pulse 78   Temp 98.4 F (36.9 C) (Oral)   LMP  (LMP Unknown)   Physical Exam Constitutional:      General: She is not in acute distress.    Appearance: She is not ill-appearing.  Cardiovascular:     Rate and Rhythm: Normal rate.  Pulmonary:     Effort: Pulmonary effort is normal.  Abdominal:     Tenderness: There is no abdominal tenderness.  Musculoskeletal:     Right lower leg: No edema.     Left lower leg: No edema.  Skin:    General: Skin is warm and dry.  Neurological:     Mental Status: She is alert and oriented to person, place, and time.      Labs:  CBC: Recent Labs    07/04/23 1421 10/24/23 0857  WBC 3.7 3.4  HGB 11.5 12.0  HCT 38.6 38.9  PLT 235 269    COAGS: Recent Labs    02/05/24 1015  INR 1.1    BMP: Recent Labs    10/24/23 0857 02/05/24 1015 03/19/24 1358  NA 142 142 145*  K 4.2 4.6 4.7  CL 101 101 103  CO2 26 24 24   GLUCOSE 83 85 72  BUN 14 14 19   CALCIUM  9.9 9.8 9.5  CREATININE 0.88 0.90 1.00    LIVER FUNCTION TESTS: Recent Labs    07/04/23 1421 10/24/23 0857 02/05/24 1015 03/19/24 1358  BILITOT 0.4 0.5 0.6 0.5  AST 21 28 19 19   ALT 15 25 16 13   ALKPHOS 156* 156* 144* 139*  PROT 7.8 7.9 7.9 7.7  ALBUMIN 4.4 4.4 4.5 4.4    Assessment and Plan:  63 year old female now 8 years status post endovascular repair of a complex bilobed right renal artery aneurysm utilizing coaxially in laid pipeline flow diverting stents.   Dr. Karalee met with  the patient and reviewed her recent imaging. There has been continued slow atrophy in parts of the right kidney which is not unexpected. Her left kidney has grown slightly and thus her overall kidney function is doing well.   Dr. Karalee  recommended one more surveillance CTA abdomen in 2 years followed by a clinic visit. After that, assuming things remain stable, she will not need further surveillance imaging or follow up with IR. The patient was agreeable to this plan.    Electronically Signed: Warren JONELLE Dais 05/02/2024, 9:47 AM   I spent a total of 25 Minutes in face to face in clinical consultation, greater than 50% of which was counseling/coordinating care for right renal artery aneurysm.

## 2024-05-02 ENCOUNTER — Telehealth: Payer: Self-pay

## 2024-05-02 ENCOUNTER — Ambulatory Visit
Admission: RE | Admit: 2024-05-02 | Discharge: 2024-05-02 | Disposition: A | Discharge status: Home / Self Care | Source: Ambulatory Visit | Attending: Interventional Radiology | Admitting: Interventional Radiology

## 2024-05-02 DIAGNOSIS — I722 Aneurysm of renal artery: Secondary | ICD-10-CM

## 2024-05-02 MED ORDER — TRELEGY ELLIPTA 100-62.5-25 MCG/ACT IN AEPB: 1.0000 | INHALATION_SPRAY | Freq: Every morning | RESPIRATORY_TRACT | 1 refills | Status: DC

## 2024-05-02 NOTE — Addendum Note (Signed)
 Addended by: FRANCIS ROULEAU A on: 05/02/2024 04:27 PM   Modules accepted: Orders

## 2024-05-02 NOTE — Telephone Encounter (Signed)
 Patient came in today and requested that we fax off her [employer] patient assistance application for Trelegy. Forms have been faxed off and copy mailed to her home.

## 2024-05-19 ENCOUNTER — Telehealth: Payer: Self-pay | Admitting: Internal Medicine

## 2024-05-19 NOTE — Telephone Encounter (Signed)
 I spoke to patient about her appt in September with Dr. Shaaron, she said she thinks that the order has to be put in for her to get bloodwork before coming to that appt.  Is that correct?

## 2024-05-20 NOTE — Telephone Encounter (Signed)
 They are scheduled to be mailed to her to have done before her appt.

## 2024-05-21 ENCOUNTER — Other Ambulatory Visit: Payer: Self-pay | Admitting: Family Medicine

## 2024-06-03 ENCOUNTER — Other Ambulatory Visit: Payer: Self-pay | Admitting: Family Medicine

## 2024-06-18 ENCOUNTER — Other Ambulatory Visit: Payer: Self-pay | Admitting: Family Medicine

## 2024-06-23 ENCOUNTER — Telehealth: Payer: Self-pay

## 2024-06-23 ENCOUNTER — Ambulatory Visit: Admitting: Gastroenterology

## 2024-06-23 NOTE — Telephone Encounter (Signed)
 Your request has been approved CaseId:[id number];Status:Approved;Review Type:Prior Auth;Coverage Start Date:05/24/2024;Coverage End Date:06/23/2025; Authorization Expiration09/15/2026

## 2024-06-23 NOTE — Telephone Encounter (Signed)
*  AA  Pharmacy Patient Advocate Encounter   Received notification from CoverMyMeds that prior authorization for Xhance  is required/requested.   Insurance verification completed.   The patient is insured through [employer] .   Per test claim: PA required; PA submitted to above mentioned insurance via Latent Key/confirmation #/EOC [id number] Status is pending

## 2024-07-01 ENCOUNTER — Encounter: Payer: Self-pay | Admitting: Internal Medicine

## 2024-07-01 ENCOUNTER — Ambulatory Visit (INDEPENDENT_AMBULATORY_CARE_PROVIDER_SITE_OTHER): Admitting: Internal Medicine

## 2024-07-01 ENCOUNTER — Other Ambulatory Visit: Payer: Self-pay | Admitting: *Deleted

## 2024-07-01 ENCOUNTER — Other Ambulatory Visit: Payer: Self-pay

## 2024-07-01 VITALS — BP 123/83 | HR 77 | Temp 98.9°F | Ht 69.0 in | Wt 217.2 lb

## 2024-07-01 DIAGNOSIS — K743 Primary biliary cirrhosis: Secondary | ICD-10-CM

## 2024-07-01 DIAGNOSIS — R7989 Other specified abnormal findings of blood chemistry: Secondary | ICD-10-CM | POA: Diagnosis not present

## 2024-07-01 NOTE — Progress Notes (Unsigned)
 Gastroenterology Progress Note    Primary Care Physician:  Antonetta Rollene BRAVO, MD Primary Gastroenterologist:  Dr. Shaaron  Pre-Procedure History & Physical: HPI:  Allison Hart is a 63 y.o. female here for follow-up of AMA positive PBC LFTs and have on balance improved normal transaminases alkaline phosphatase mildly elevated but still less than 1.67 the upper limit of normal total bilirubin 4.0 no itching with ursodiol .  Has not had LFTs since June.  Previous history of elevated IgG and a ANA.  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  Normal ultrasound of the liver earlier this year.  Past Medical History:  Diagnosis Date   Anxiety    Arthritis    Essential hypertension, benign    GERD (gastroesophageal reflux disease)    Herpes    History of kidney stones    Murmur    Obesity    Primary biliary cholangitis (HCC)    Sinusitis    Sleep apnea    Urinary frequency     Past Surgical History:  Procedure Laterality Date   BIOPSY  08/29/2018   Procedure: BIOPSY;  Surgeon: Shaaron Lamar HERO, MD;  [facility];  Service: Endoscopy;;  gastric   COLONOSCOPY  11/13/2012   Procedure: COLONOSCOPY;  Surgeon: Claudis RAYMOND Rivet, MD;  [facility]: [facility];  Service: Endoscopy;  Laterality: N/A;  930   COLONOSCOPY WITH PROPOFOL  N/A 12/18/2022   Procedure: COLONOSCOPY WITH PROPOFOL ;  Surgeon: Shaaron Lamar HERO, MD;  [facility];  Service: Endoscopy;  Laterality: N/A;  7:30 am   embolization of uterine artery X 2     ESOPHAGOGASTRODUODENOSCOPY (EGD) WITH PROPOFOL  N/A 08/29/2018   Procedure: ESOPHAGOGASTRODUODENOSCOPY (EGD) WITH PROPOFOL ;  Surgeon: Shaaron Lamar HERO, MD;  [facility];  Service: Endoscopy;  Laterality: N/A;  3:00pm   INGUINAL LYMPH NODE BIOPSY Right 04/14/2016   Procedure: RIGHT INGUINAL LYMPH NODE BIOPSY;  Surgeon: Oneil Budge, MD;  Location: [facility];  Service: General;  Laterality: Right;   IR ANGIO  INTRA EXTRACRAN SEL COM CAROTID INNOMINATE BILAT MOD SED  02/12/2017   IR ANGIO VERTEBRAL SEL VERTEBRAL BILAT MOD SED  02/12/2017   IR GENERIC HISTORICAL  04/27/2016   IR RADIOLOGIST EVAL & MGMT 04/27/2016 Wilkie Lent, MD [facility]   IR GENERIC HISTORICAL  12/05/2016   IR RADIOLOGIST EVAL & MGMT 12/05/2016 Wilkie Lent, MD [facility]   IR RADIOLOGIST EVAL & MGMT  01/22/2017   IR RADIOLOGIST EVAL & MGMT  08/06/2018   IR RADIOLOGIST EVAL & MGMT  08/19/2020   IR RADIOLOGIST EVAL & MGMT  09/07/2021   IR RADIOLOGIST EVAL & MGMT  05/02/2024   Left inguinal hernia repair and endometriosis  2007   No definitive records    Left parotid bx benign  2005   RADIOLOGY WITH ANESTHESIA N/A 01/18/2016   Procedure: RADIOLOGY WITH ANESTHESIA;  Surgeon: Thyra Nash, MD;  Location: [facility];  Service: Radiology;  Laterality: N/A;   RADIOLOGY WITH ANESTHESIA N/A 02/12/2017   Procedure: AUREA EMBOLIZATION;  Surgeon: Nash Thyra, MD;  Location: [facility];  Service: Radiology;  Laterality: N/A;   renal artery aneurysm repair right Right 01/2016   UMBILICAL HERNIA REPAIR  infancy     Prior to Admission medications   Medication Sig Start Date End Date Taking? Authorizing Provider  acyclovir  (ZOVIRAX ) 400 MG tablet Take 1 tablet by mouth twice daily Patient taking differently: Take 400 mg by mouth 2 (  two) times daily as needed. 06/03/24  Yes Antonetta Rollene BRAVO, MD  albuterol  (VENTOLIN  HFA) 108 (90 Base) MCG/ACT inhaler Inhale 1-2 puffs into the lungs every 6 (six) hours as needed for wheezing or shortness of breath. 02/04/24  Yes Tobie Arleta SQUIBB, MD  amlodipine -olmesartan  (AZOR ) 10-20 MG tablet Take 1 tablet by mouth once daily 04/04/24  Yes Antonetta Rollene BRAVO, MD  aspirin  EC 81 MG tablet Take 1 tablet (81 mg total) by mouth daily. 07/30/18  Yes Antonetta Rollene BRAVO, MD  Azelastine  HCl 137 MCG/SPRAY SOLN Place 2 sprays into the nose 2 (two) times daily as needed (allergies). 02/04/24  Yes Tobie Arleta SQUIBB, MD  cholecalciferol (VITAMIN D ) 1000 units tablet Take 1,000 Units by mouth daily.   Yes [provider]  Cyanocobalamin (VITAMIN B-12 PO) Take 1 tablet by mouth daily.   Yes [provider]  Fluticasone  Propionate (XHANCE ) 93 MCG/ACT EXHU Place 1 Inhalation into the nose in the morning and at bedtime. 02/04/24  Yes Tobie Arleta SQUIBB, MD  Fluticasone -Umeclidin-Vilant (TRELEGY ELLIPTA ) 100-62.5-25 MCG/ACT AEPB Inhale 1 puff into the lungs every morning. 05/02/24  Yes Tobie Arleta SQUIBB, MD  furosemide  (LASIX ) 40 MG tablet Take 1 tablet by mouth once daily 04/28/24  Yes Antonetta Rollene BRAVO, MD  KLOR-CON  M20 20 MEQ tablet Take 1 tablet by mouth twice daily 04/28/24  Yes Antonetta Rollene BRAVO, MD  Lidocaine  5 % CREA Knee pain/and right hip pain 02/09/23  Yes [provider]  loratadine  (CLARITIN ) 10 MG tablet Take 1 tablet (10 mg total) by mouth daily as needed for allergies. 02/04/24  Yes Tobie Arleta SQUIBB, MD  montelukast  (SINGULAIR ) 10 MG tablet Take 1 tablet (10 mg total) by mouth at bedtime. 02/04/24  Yes Tobie Arleta SQUIBB, MD  omeprazole  (PRILOSEC) 40 MG capsule Take 1 capsule by mouth once daily 06/18/24  Yes Simpson, Margaret E, MD  Pyridoxine HCl (VITAMIN B-6 PO) Take 1 tablet by mouth daily.   Yes [provider]  spironolactone  (ALDACTONE ) 25 MG tablet Take 1 tablet by mouth once daily 04/28/24  Yes Antonetta Rollene BRAVO, MD  ursodiol  (ACTIGALL ) 300 MG capsule TAKE 2 CAPSULES BY MOUTH TWICE DAILY WITH A MEAL 02/14/24  Yes Shaaron Lamar HERO, MD    Allergies as of 07/01/2024 - Review Complete 07/01/2024  Allergen Reaction Noted   Versed  [midazolam ] Shortness Of Breath and Other (See Comments) 01/18/2016   Misc. sulfonamide containing compounds Itching 03/14/2023   Sulfa  antibiotics Other (See Comments) 08/02/2018    Family History  Problem Relation Age of Onset   Hypertension Mother    Hyperlipidemia Mother    Thyroid disease Mother    Coronary artery disease Mother     Irregular heart beat Mother    Asthma Sister    Fibroids Sister    Hypertension Sister    Asthma Maternal Grandfather    Ulcerative colitis Son    Asthma Niece    Colon cancer Neg Hx     Social History   Socioeconomic History   Marital status: Married    Spouse name: Not on file   Number of children: Not on file   Years of education: Not on file   Highest education level: Associate degree: occupational, Scientist, product/process development, or vocational program  Occupational History   Not on file  Tobacco Use   Smoking status: Former    Current packs/day: 0.00    Average packs/day: 0.3 packs/day for 5.0 years (1.3 ttl pk-yrs)    Types: Cigarettes  Start date: 89    Quit date: 1997    Years since quitting: 28.7   Smokeless tobacco: Never  Vaping Use   Vaping status: Never Used  Substance and Sexual Activity   Alcohol use: Not Currently    Comment: rare   Drug use: No   Sexual activity: Yes    Birth control/protection: None  Other Topics Concern   Not on file  Social History Narrative   Not on file   Social Drivers of Health   Financial Resource Strain: Not on file  Food Insecurity: Not on file  Transportation Needs: No Transportation Needs (07/15/2019)   PRAPARE - Administrator, Civil Service (Medical): No    Lack of Transportation (Non-Medical): No  Physical Activity: Not on file  Stress: Not on file  Social Connections: Not on file  Intimate Partner Violence: Not on file    Review of Systems   See HPI, otherwise negative ROS  Physical Exam: BP 123/83 (BP Location: Right Arm, Patient Position: Sitting, Cuff Size: Large)   Pulse 77   Temp 98.9 F (37.2 C) (Oral)   Ht 5' 9 (1.753 m)   Wt 217 lb 3.2 oz (98.5 kg)   LMP  (LMP Unknown)   SpO2 97%   BMI 32.07 kg/m  General:   Alert,  Well-developed, well-nourished, pleasant and cooperative in NAD Skin: No cutaneous stigmata of chronic liver disease  eyes:  Sclera clear, no icterus.   Conjunctiva pink. Abdomen:  Non-distended, normal bowel sounds.  Soft and nontender without appreciable mass or hepatosplenomegaly.   Impression/Plan:   63 year old lady with a mixed hepatocellular/cholestatic pattern on LFTs noted for some time.  AMA positive phenotypically, this is disease behaving like PBC.  On weight-based Urso  her transaminases are normal and her alkaline phosphatase is hovering in the less than 1.67 the upper limit of normal range.  Bili good at 0.4  No evidence of chronic liver disease.  At this point no need for liver biopsy.  Would like to see all of her liver parameters well within the normal range.  Lets update her hepatic function profile and see where we stand.  We previously discussed Ocaliva  but that is now off the market.  Recommendations  We will order hepatic function profile  We we will also obtain a bone density study.  Continue taking your supplement for fat-soluble vitamins (vitamin DDA and K)  Continue Urso  indefinitely.  Once I get these labs available for review I will be back in touch with you.    We will order hepatic function profile  We we will also obtain a bone density study.  Continue taking your supplement for fat-soluble vitamins (vitamin DDA and K)  Continue Urso  indefinitely.  Once I get these labs available for review I will be back in touch with you.  Notice: This dictation was prepared with Dragon dictation along with smaller phrase technology. Any transcriptional errors that result from this process are unintentional and may not be corrected upon review.

## 2024-07-01 NOTE — Patient Instructions (Signed)
 It was good to see you today.  Your liver enzymes recently have improved.   Need to see how they are doing at this time.  We will order hepatic function profile  We we will also obtain a bone density study.  Continue taking your supplement for fat-soluble vitamins (vitamin DDA and K)  Continue Urso  indefinitely.  Once I get these labs available for review I will be back in touch with you.

## 2024-07-02 ENCOUNTER — Telehealth: Payer: Self-pay

## 2024-07-02 ENCOUNTER — Ambulatory Visit: Admitting: Gastroenterology

## 2024-07-02 ENCOUNTER — Other Ambulatory Visit: Payer: Self-pay | Admitting: Family Medicine

## 2024-07-02 NOTE — Telephone Encounter (Signed)
-----   Message from Lamar Hollingshead sent at 07/01/2024  4:23 PM EDT ----- Patient not sure if she completed hepatitis A and B vaccine or not.  Does not remember.  I believe she said [facility] can we just get some confirmation that is been done at your leisure.

## 2024-07-02 NOTE — Telephone Encounter (Signed)
 Pt completed both series on 01/10/2024 at [facility] in [place].

## 2024-07-02 NOTE — Telephone Encounter (Signed)
 It is listed under immunizations in the pt's record.

## 2024-07-03 ENCOUNTER — Telehealth: Admitting: Family Medicine

## 2024-07-03 DIAGNOSIS — G4730 Sleep apnea, unspecified: Secondary | ICD-10-CM | POA: Diagnosis not present

## 2024-07-03 NOTE — Progress Notes (Signed)
 Virtual Visit via Video Note  I connected with Allison Hart on 07/03/24 at  8:40 AM EDT by a video enabled telemedicine application and verified that I am speaking with the correct person using two identifiers.  Patient Location: Home Provider Location: Home Office  I discussed the limitations, risks, security, and privacy concerns of performing an evaluation and management service by video and the availability of in person appointments. I also discussed with the patient that there may be a patient responsible charge related to this service. The patient expressed understanding and agreed to proceed.  Subjective: PCP: Antonetta Rollene BRAVO, MD  No chief complaint on file.  HPI Patient presents via telehealth for a new prescription for her CPAP machine. She has been using CPAP therapy for several years for the treatment of obstructive sleep apnea  ROS: Per HPI  Current Outpatient Medications:    acyclovir  (ZOVIRAX ) 400 MG tablet, Take 1 tablet by mouth twice daily (Patient taking differently: Take 400 mg by mouth 2 (two) times daily as needed.), Disp: 60 tablet, Rfl: 0   albuterol  (VENTOLIN  HFA) 108 (90 Base) MCG/ACT inhaler, Inhale 1-2 puffs into the lungs every 6 (six) hours as needed for wheezing or shortness of breath., Disp: 18 g, Rfl: 1   amlodipine -olmesartan  (AZOR ) 10-20 MG tablet, Take 1 tablet by mouth once daily, Disp: 90 tablet, Rfl: 0   aspirin  EC 81 MG tablet, Take 1 tablet (81 mg total) by mouth daily., Disp: 100 tablet, Rfl: 2   Azelastine  HCl 137 MCG/SPRAY SOLN, Place 2 sprays into the nose 2 (two) times daily as needed (allergies)., Disp: 30 mL, Rfl: 5   cholecalciferol (VITAMIN D ) 1000 units tablet, Take 1,000 Units by mouth daily., Disp: , Rfl:    Cyanocobalamin (VITAMIN B-12 PO), Take 1 tablet by mouth daily., Disp: , Rfl:    Fluticasone  Propionate (XHANCE ) 93 MCG/ACT EXHU, Place 1 Inhalation into the nose in the morning and at bedtime., Disp: 16 mL, Rfl: 5    Fluticasone -Umeclidin-Vilant (TRELEGY ELLIPTA ) 100-62.5-25 MCG/ACT AEPB, Inhale 1 puff into the lungs every morning., Disp: 84 each, Rfl: 1   furosemide  (LASIX ) 40 MG tablet, Take 1 tablet by mouth once daily, Disp: 90 tablet, Rfl: 0   KLOR-CON  M20 20 MEQ tablet, Take 1 tablet by mouth twice daily, Disp: 180 tablet, Rfl: 0   Lidocaine  5 % CREA, Knee pain/and right hip pain, Disp: , Rfl:    loratadine  (CLARITIN ) 10 MG tablet, Take 1 tablet (10 mg total) by mouth daily as needed for allergies., Disp: 90 tablet, Rfl: 1   montelukast  (SINGULAIR ) 10 MG tablet, Take 1 tablet (10 mg total) by mouth at bedtime., Disp: 90 tablet, Rfl: 1   omeprazole  (PRILOSEC) 40 MG capsule, Take 1 capsule by mouth once daily, Disp: 30 capsule, Rfl: 0   Pyridoxine HCl (VITAMIN B-6 PO), Take 1 tablet by mouth daily., Disp: , Rfl:    spironolactone  (ALDACTONE ) 25 MG tablet, Take 1 tablet by mouth once daily, Disp: 90 tablet, Rfl: 0   ursodiol  (ACTIGALL ) 300 MG capsule, TAKE 2 CAPSULES BY MOUTH TWICE DAILY WITH A MEAL, Disp: 360 capsule, Rfl: 2  Observations/Objective: There were no vitals filed for this visit. Physical Exam Patient is alert and no acute distress noted.   Assessment and Plan: Sleep apnea in adult Assessment & Plan: Stop Bang score 4 points Referral placed to sleep studies      Follow Up Instructions: No follow-ups on file.   I discussed the assessment  and treatment plan with the patient. The patient was provided an opportunity to ask questions, and all were answered. The patient agreed with the plan and demonstrated an understanding of the instructions.   The patient was advised to call back or seek an in-person evaluation if the symptoms worsen or if the condition fails to improve as anticipated.  The above assessment and management plan was discussed with the patient. The patient verbalized understanding of and has agreed to the management plan.   Hilario Kidd Wilhelmena Falter, FNP

## 2024-07-03 NOTE — Assessment & Plan Note (Signed)
 Stop Bang score 4 points Referral placed to sleep studies

## 2024-07-04 LAB — HEPATIC FUNCTION PANEL
ALT: 24 IU/L (ref 0–32)
AST: 26 IU/L (ref 0–40)
Albumin: 4.5 g/dL (ref 3.9–4.9)
Alkaline Phosphatase: 136 IU/L — ABNORMAL HIGH (ref 49–135)
Bilirubin Total: 0.4 mg/dL (ref 0.0–1.2)
Bilirubin, Direct: 0.17 mg/dL (ref 0.00–0.40)
Total Protein: 7.7 g/dL (ref 6.0–8.5)

## 2024-07-07 ENCOUNTER — Ambulatory Visit (HOSPITAL_COMMUNITY)
Admission: RE | Admit: 2024-07-07 | Discharge: 2024-07-07 | Disposition: A | Discharge status: Home / Self Care | Source: Ambulatory Visit | Attending: Internal Medicine | Admitting: Internal Medicine

## 2024-07-07 ENCOUNTER — Encounter: Payer: Self-pay | Admitting: Family Medicine

## 2024-07-07 DIAGNOSIS — K743 Primary biliary cirrhosis: Secondary | ICD-10-CM | POA: Diagnosis present

## 2024-07-08 ENCOUNTER — Encounter: Payer: Self-pay | Admitting: Family Medicine

## 2024-07-14 ENCOUNTER — Ambulatory Visit: Payer: Self-pay | Admitting: Internal Medicine

## 2024-07-18 ENCOUNTER — Telehealth: Payer: Self-pay | Admitting: Internal Medicine

## 2024-07-18 NOTE — Telephone Encounter (Signed)
 Spoke to [employer] at [employer]  Reviewed this patient's clinical progress over time.  On Urso  for over a year her transaminases have been normalized.  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  The presence of other autoimmune markers can be discounted at it is unlikely she has an autoimmune component with rapid normalization of transaminases.  Dawn pointed out that she has seen biopsy-proven PBC with elevated transaminases and normal alkaline phosphatase.  She agrees with continuing Urso  and planning to add on Livedelzi since alkaline phosphatase is not completely normalized.  She also recommended fractionating alkaline phosphatase and obtaining a 5' nucleotidase and serum GGT.  Go ahead and get preauthorization for Livedelzi and follow-up on the above-mentioned studies as they become available.  I called patient today and updated her on telephone consultation and latest recommendations.  She is in agreement with plan. Specifically reviewed latest labs and bone density study.  Further recommendations to follow.  Tammy, can we work on preauthorization and it lets have her come in next week and get the above-mentioned labs drawn.

## 2024-07-18 NOTE — Telephone Encounter (Signed)
 done

## 2024-07-18 NOTE — Telephone Encounter (Signed)
 Pt needs appt with sleep dr - old dr has retired

## 2024-07-21 ENCOUNTER — Other Ambulatory Visit: Payer: Self-pay

## 2024-07-21 DIAGNOSIS — K743 Primary biliary cirrhosis: Secondary | ICD-10-CM

## 2024-07-21 DIAGNOSIS — R7989 Other specified abnormal findings of blood chemistry: Secondary | ICD-10-CM

## 2024-07-21 NOTE — Telephone Encounter (Signed)
 Labs were ordered and pt was made aware to have done as soon as possible, pt verbalized understanding. Will start process on Livedelzi.

## 2024-07-22 ENCOUNTER — Other Ambulatory Visit: Payer: Self-pay | Admitting: Family Medicine

## 2024-07-23 ENCOUNTER — Ambulatory Visit: Admitting: Nurse Practitioner

## 2024-07-23 NOTE — Telephone Encounter (Signed)
 Forms have been filled out. Waiting on signatures from patient and Dr. Shaaron to submit for processing.

## 2024-07-24 ENCOUNTER — Other Ambulatory Visit: Payer: Self-pay | Admitting: Family Medicine

## 2024-07-24 ENCOUNTER — Ambulatory Visit: Payer: Self-pay | Admitting: Internal Medicine

## 2024-07-24 LAB — NUCLEOTIDASE, 5', BLOOD: 5-Nucleotidase: 3 IU/L (ref 0–11)

## 2024-07-24 LAB — ALKALINE PHOSPHATASE ISOENZYMES
Alkaline Phosphatase: 142 IU/L — AB (ref 49–135)
BONE FRACTION %:: 24 %
Bone Fraction IU/L:: 34 IU/L (ref 18–57)
INTESTINAL FRAC.%:: 0 %
INTESTINALFRAC.IU/L:: 0 IU/L (ref 0–14)
LIVER FRACTION %:: 76 %
Liver Fraction IU/L:: 108 IU/L — AB (ref 23–85)

## 2024-07-24 LAB — GAMMA GT: GGT: 21 IU/L (ref 0–60)

## 2024-07-28 ENCOUNTER — Other Ambulatory Visit: Payer: Self-pay | Admitting: Family Medicine

## 2024-07-28 NOTE — Telephone Encounter (Signed)
 Form has been faxed for processing.

## 2024-08-07 LAB — CMP14+EGFR
ALT: 15 IU/L (ref 0–32)
AST: 21 IU/L (ref 0–40)
Albumin: 4.6 g/dL (ref 3.9–4.9)
Alkaline Phosphatase: 127 IU/L (ref 49–135)
BUN/Creatinine Ratio: 26 (ref 12–28)
BUN: 25 mg/dL (ref 8–27)
Bilirubin Total: 0.6 mg/dL (ref 0.0–1.2)
CO2: 23 mmol/L (ref 20–29)
Calcium: 9.8 mg/dL (ref 8.7–10.3)
Chloride: 102 mmol/L (ref 96–106)
Creatinine, Ser: 0.97 mg/dL (ref 0.57–1.00)
Globulin, Total: 3.2 g/dL (ref 1.5–4.5)
Glucose: 81 mg/dL (ref 70–99)
Potassium: 5.1 mmol/L (ref 3.5–5.2)
Sodium: 144 mmol/L (ref 134–144)
Total Protein: 7.8 g/dL (ref 6.0–8.5)
eGFR: 66 mL/min/1.73 (ref >59)

## 2024-08-07 LAB — LIPID PANEL
Chol/HDL Ratio: 4.3 ratio (ref 0.0–4.4)
Cholesterol, Total: 225 mg/dL — ABNORMAL HIGH (ref 100–199)
HDL: 52 mg/dL (ref >39)
LDL Chol Calc (NIH): 159 mg/dL — ABNORMAL HIGH (ref 0–99)
Triglycerides: 77 mg/dL (ref 0–149)
VLDL Cholesterol Cal: 14 mg/dL (ref 5–40)

## 2024-08-08 ENCOUNTER — Ambulatory Visit: Payer: Self-pay | Admitting: Family Medicine

## 2024-08-11 ENCOUNTER — Other Ambulatory Visit: Payer: Self-pay

## 2024-08-11 ENCOUNTER — Ambulatory Visit: Admitting: Internal Medicine

## 2024-08-11 VITALS — BP 118/80 | HR 79 | Temp 97.2°F | Resp 18 | Ht 68.9 in | Wt 217.2 lb

## 2024-08-11 DIAGNOSIS — J3089 Other allergic rhinitis: Secondary | ICD-10-CM | POA: Diagnosis not present

## 2024-08-11 DIAGNOSIS — J4540 Moderate persistent asthma, uncomplicated: Secondary | ICD-10-CM

## 2024-08-11 DIAGNOSIS — J302 Other seasonal allergic rhinitis: Secondary | ICD-10-CM | POA: Diagnosis not present

## 2024-08-11 MED ORDER — AZELASTINE HCL 137 MCG/SPRAY NA SOLN: 2.0000 | Freq: Two times a day (BID) | NASAL | 5 refills | Status: AC | PRN

## 2024-08-11 MED ORDER — MONTELUKAST SODIUM 10 MG PO TABS: 10.0000 mg | ORAL_TABLET | Freq: Every day | ORAL | 1 refills | Status: AC

## 2024-08-11 MED ORDER — XHANCE 93 MCG/ACT NA EXHU: 1.0000 | INHALANT_SUSPENSION | Freq: Two times a day (BID) | NASAL | 1 refills | Status: DC

## 2024-08-11 MED ORDER — TRELEGY ELLIPTA 100-62.5-25 MCG/ACT IN AEPB: 1.0000 | INHALATION_SPRAY | Freq: Every morning | RESPIRATORY_TRACT | 5 refills | Status: AC

## 2024-08-11 MED ORDER — ALBUTEROL SULFATE HFA 108 (90 BASE) MCG/ACT IN AERS: 1.0000 | INHALATION_SPRAY | Freq: Four times a day (QID) | RESPIRATORY_TRACT | 1 refills | Status: DC | PRN

## 2024-08-11 MED ORDER — LORATADINE 10 MG PO TABS: 10.0000 mg | ORAL_TABLET | Freq: Every day | ORAL | 1 refills | Status: DC | PRN

## 2024-08-11 NOTE — Progress Notes (Signed)
   FOLLOW UP Date of Service/Encounter:  08/11/24   Subjective:  Allison Hart (DOB: [birth year]) is a 63 y.o. female who [facility] for follow up for asthma, allergic rhinitis and food allergies.  History obtained from: chart review and patient. Last seen by me on 02/04/2204 Asthma: doing well on Trelegy. AR- not well controlled, Xhance , Azelastine , Claritin    Asthma has done well since last visit.  Rarely needs Albuterol , few times a month or less.  No ER/urgent care/oral prednisone .  On Trelegy 1 puff daily.  Allergies are doing well too, denies trouble with congestion/drainage/sneezing/itching.  Using Azelastine , Xhance , Singulair  daily and PRN Claritin .   Past Medical History: Past Medical History:  Diagnosis Date   Anxiety    Arthritis    Essential hypertension, benign    GERD (gastroesophageal reflux disease)    Herpes    History of kidney stones    Murmur    Obesity    Primary biliary cholangitis (HCC)    Sinusitis    Sleep apnea    Urinary frequency     Objective:  BP 118/80 (BP Location: Left Arm, Patient Position: Sitting, Cuff Size: Normal)   Pulse 79   Temp (!) 97.2 F (36.2 C) (Temporal)   Resp 18   Ht 5' 8.9 (1.75 m)   Wt 217 lb 3.2 oz (98.5 kg)   LMP  (LMP Unknown)   SpO2 95%   BMI 32.17 kg/m  Body mass index is 32.17 kg/m. Physical Exam: GEN: alert, well developed HEENT: clear conjunctiva, nose with mild inferior turbinate hypertrophy, pink nasal mucosa, no rhinorrhea, no cobblestoning HEART: regular rate and rhythm, no murmur LUNGS: clear to auscultation bilaterally, no coughing, unlabored respiration SKIN: no rashes or lesions  Spirometry:  Tracings reviewed. Her effort: It was hard to get consistent efforts and there is a question as to whether this reflects a maximal maneuver. FVC: 2.23L, 73% predicted  FEV1: 1.5L, 62% predicted FEV1/FVC ratio: 67% Interpretation: Spirometry consistent with  mild obstructive disease.  Please see scanned spirometry results for details.  Assessment:   1. Seasonal and perennial allergic rhinitis   2. Moderate persistent asthma without complication     Plan/Recommendations:  Allergic Rhinitis  - Controlled  -SPT 07/2024: positive to mold, dust mite, and cockroach. -Continue Claritin  10 mg once a day as needed for runny nose, sneezing or itch -Continue Montelukast  10 mg once a day.  -Continue Azelastine  2 sprays in each nostril twice a day as needed for runny nose or nasal symptoms -Continue Xhance  1-2 spray in each nostril twice daily.  If not covered, start Nasonex or Nasacort 2 sprays each nostril daily.   -Consider saline nasal rinses as needed for nasal symptoms. Use this before any medicated nasal sprays for best result -If your symptoms are not well controlled with the treatment plan as listed above, consider allergen immunotherapy   Moderate Persistent Asthma:  - Symptomatically controlled, some obstruction on spirometry but suboptimal effort.   -Continue Trelegy 100-62.5-25mcg 1 puff once a day. -Continue montelukast  10 mg once a day. -Continue albuterol  2 puffs once every 4-6 hours as needed for shortness of breath, cough or wheeze     Return in about 6 months (around 02/08/2025).  Arleta Blanch, MD [facility]

## 2024-08-11 NOTE — Patient Instructions (Addendum)
 Allergic Rhinitis  -SPT 07/2024: positive to mold, dust mite, and cockroach. -Continue Claritin  10 mg once a day as needed for runny nose, sneezing or itch -Continue Montelukast  10 mg once a day.  -Continue Azelastine  2 sprays in each nostril twice a day as needed for runny nose or nasal symptoms -Continue Xhance  1-2 spray in each nostril twice daily.  If not covered, start Nasonex or Nasacort 2 sprays each nostril daily.   -Consider saline nasal rinses as needed for nasal symptoms. Use this before any medicated nasal sprays for best result -If your symptoms are not well controlled with the treatment plan as listed above, consider allergen immunotherapy   Moderate Persistent Asthma:  -Continue Trelegy 100-62.5-25mcg 1 puff once a day. -Continue montelukast  10 mg once a day. -Continue albuterol  2 puffs once every 4-6 hours as needed for shortness of breath, cough or wheeze

## 2024-08-15 ENCOUNTER — Encounter: Payer: Self-pay | Admitting: Family Medicine

## 2024-08-15 ENCOUNTER — Ambulatory Visit (INDEPENDENT_AMBULATORY_CARE_PROVIDER_SITE_OTHER): Admitting: Family Medicine

## 2024-08-15 ENCOUNTER — Telehealth: Payer: Self-pay

## 2024-08-15 VITALS — BP 133/85 | HR 86 | Ht 69.0 in | Wt 219.0 lb

## 2024-08-15 DIAGNOSIS — Z23 Encounter for immunization: Secondary | ICD-10-CM | POA: Diagnosis not present

## 2024-08-15 DIAGNOSIS — Z6379 Other stressful life events affecting family and household: Secondary | ICD-10-CM

## 2024-08-15 DIAGNOSIS — Z0000 Encounter for general adult medical examination without abnormal findings: Secondary | ICD-10-CM

## 2024-08-15 DIAGNOSIS — F5102 Adjustment insomnia: Secondary | ICD-10-CM | POA: Diagnosis not present

## 2024-08-15 MED ORDER — TRAZODONE HCL 50 MG PO TABS: 25.0000 mg | ORAL_TABLET | Freq: Every evening | ORAL | 3 refills | Status: DC | PRN

## 2024-08-15 MED ORDER — SEMAGLUTIDE-WEIGHT MANAGEMENT 0.25 MG/0.5ML ~~LOC~~ SOAJ: 0.2500 mg | SUBCUTANEOUS | 0 refills | Status: DC

## 2024-08-15 MED ORDER — ACYCLOVIR 400 MG PO TABS: 400.0000 mg | ORAL_TABLET | Freq: Two times a day (BID) | ORAL | 0 refills | Status: AC

## 2024-08-15 MED ORDER — SEMAGLUTIDE-WEIGHT MANAGEMENT 0.5 MG/0.5ML ~~LOC~~ SOAJ: 0.5000 mg | SUBCUTANEOUS | 2 refills | Status: DC

## 2024-08-15 NOTE — Patient Instructions (Addendum)
 F/U in 11 to 12 weeks  No labs needed for next visit  Please change food choice as discussed and commit to exercise 30 minutes 5 days per week  Please contact employee assistance for help with stress   New is weekly wegovy   New is trazodone for sleep  Need hepatitis vaccines and covid vaccine please check your pharmacy  Thanks for choosing [facility], we consider it a privelige to serve you.

## 2024-08-15 NOTE — Telephone Encounter (Signed)
Lmom for return call.  

## 2024-08-15 NOTE — Progress Notes (Signed)
    Allison Hart     MRN: [id number]      DOB: [birth year]  Chief Complaint  Patient presents with   Annual Exam    HPI: Patient is in for annual physical exam. C/o increasing stress caring for spouse with severe dementia and limited help C/o poor sleep C/o weight regain , wants to resume wegovy  and will work on lifestyle change , has been overeating due to stress Recent labs,  are reviewed. Immunization is reviewed , and  will be updated when she has verified with her pharmacy what she needs   PE: BP 133/85   Pulse 86   Ht 5' 9 (1.753 m)   Wt 219 lb (99.3 kg)   LMP  (LMP Unknown)   SpO2 96%   BMI 32.34 kg/m   Pleasant  female, alert and oriented x 3, in no cardio-pulmonary distress. Afebrile. HEENT No facial trauma or asymetry. Sinuses non tender.  Extra occullar muscles intact.. External ears normal, . Neck: supple, no adenopathy,JVD or thyromegaly.No bruits.  Chest: Clear to ascultation bilaterally.No crackles or wheezes. Non tender to palpation  Cardiovascular system; Heart sounds normal,  S1 and  S2 ,no S3.  No murmur, or thrill. Apical beat not displaced Peripheral pulses normal.  Abdomen: Soft, non tender, no organomegaly or masses. No bruits. Bowel sounds normal. No guarding, tenderness or rebound.    Musculoskeletal exam: Full ROM of spine, hips , shoulders and knees. No deformity ,swelling or crepitus noted. No muscle wasting or atrophy.   Neurologic: Cranial nerves 2 to 12 intact. Power, tone ,sensation and reflexes normal throughout. No disturbance in gait. No tremor.  Skin: Intact, no ulceration, erythema , scaling or rash noted. Pigmentation normal throughout  Psych; Normal mood and affect. Judgement and concentration normal   Assessment & Plan:  Encounter for annual physical exam Annual exam as documented. Counseling done  re healthy lifestyle involving commitment to 150 minutes exercise per week, heart healthy diet, and  attaining healthy weight.The importance of adequate sleep also discussed. Regular seat belt use and home safety, is also discussed. Changes in health habits are decided on by the patient with goals and time frames  set for achieving them. Immunization and cancer screening needs are specifically addressed at this visit.   Insomnia due to psychological stress Sleep hygiene reviewed and written information offered also. Prescription sent for  medication needed. Trazodone 50 mg half to one tablet at bedtime is started as new med  Stress due to illness of family member Recommend therapy through her job and commitment to 30 minute exercise during lunch break at work

## 2024-08-15 NOTE — Telephone Encounter (Signed)
-----   Message from Lamar Hollingshead sent at 08/15/2024  7:14 AM EST ----- Well, we finally have normal LFTs.  First of seen of these.  Late response to Urso ?.  I think we can hold up on adding second agent for now.  Lets just repeat a hepatic function profile in 2 months.  Let patient know. 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 Justy FYI, all of her labs are back but not sure you have addressed but not in your inbasket. ----- Message ----- From: Hollingshead Lamar CHRISTELLA, MD Sent: 07/07/2024   3:09 PM EST To: Sonny GORMAN Ezzard, PA-C    If you got   A minute tomorrow,  I'd  like to talk to you briefly about this patient.

## 2024-08-17 ENCOUNTER — Encounter: Payer: Self-pay | Admitting: Family Medicine

## 2024-08-17 DIAGNOSIS — Z6379 Other stressful life events affecting family and household: Secondary | ICD-10-CM | POA: Insufficient documentation

## 2024-08-17 DIAGNOSIS — F5102 Adjustment insomnia: Secondary | ICD-10-CM | POA: Insufficient documentation

## 2024-08-17 DIAGNOSIS — Z23 Encounter for immunization: Secondary | ICD-10-CM | POA: Insufficient documentation

## 2024-08-17 NOTE — Assessment & Plan Note (Signed)

## 2024-08-17 NOTE — Assessment & Plan Note (Signed)
 Sleep hygiene reviewed and written information offered also. Prescription sent for  medication needed. Trazodone 50 mg half to one tablet at bedtime is started as new med

## 2024-08-17 NOTE — Assessment & Plan Note (Signed)
 Recommend therapy through her job and commitment to 30 minute exercise during lunch break at work

## 2024-08-17 NOTE — Assessment & Plan Note (Signed)
 After obtaining informed consent, the vaccine is  administered , with no adverse effect noted at the time of administration.

## 2024-08-18 ENCOUNTER — Telehealth: Payer: Self-pay | Admitting: Internal Medicine

## 2024-08-18 ENCOUNTER — Encounter: Payer: Self-pay | Admitting: Family Medicine

## 2024-08-18 MED ORDER — XHANCE 93 MCG/ACT NA EXHU: 1.0000 | INHALANT_SUSPENSION | Freq: Two times a day (BID) | NASAL | 1 refills | Status: AC

## 2024-08-18 NOTE — Telephone Encounter (Signed)
 Refill sent in

## 2024-08-18 NOTE — Telephone Encounter (Addendum)
 [facility] called stating the patient is needing a refill on Xhance .

## 2024-08-19 ENCOUNTER — Other Ambulatory Visit (HOSPITAL_COMMUNITY): Payer: Self-pay

## 2024-08-19 ENCOUNTER — Other Ambulatory Visit: Payer: Self-pay

## 2024-08-19 DIAGNOSIS — K743 Primary biliary cirrhosis: Secondary | ICD-10-CM

## 2024-08-19 DIAGNOSIS — R7989 Other specified abnormal findings of blood chemistry: Secondary | ICD-10-CM

## 2024-08-19 NOTE — Telephone Encounter (Signed)
 Lmom for return call, also sent pt a mychart message requesting return call.

## 2024-08-19 NOTE — Telephone Encounter (Signed)
 Pt was made aware. Lab was ordered and will be mailed to the pt to have done in 2 mths.

## 2024-08-20 ENCOUNTER — Other Ambulatory Visit (HOSPITAL_COMMUNITY): Payer: Self-pay

## 2024-08-20 ENCOUNTER — Telehealth: Payer: Self-pay | Admitting: Pharmacy Technician

## 2024-08-20 NOTE — Telephone Encounter (Signed)
 Pharmacy Patient Advocate Encounter   Received notification from Onbase that prior authorization for Wegovy  0.25MG /0.5ML auto-injectors is required/requested.   Insurance verification completed.   The patient is insured through [employer].   Per test claim: PA required; PA submitted to above mentioned insurance via Latent Key/confirmation #/[id number] [facility] Status is pending

## 2024-08-21 ENCOUNTER — Ambulatory Visit (INDEPENDENT_AMBULATORY_CARE_PROVIDER_SITE_OTHER): Admitting: Nurse Practitioner

## 2024-08-21 ENCOUNTER — Encounter: Payer: Self-pay | Admitting: Nurse Practitioner

## 2024-08-21 VITALS — BP 126/74 | HR 80 | Temp 97.3°F | Ht 69.0 in | Wt 214.8 lb

## 2024-08-21 DIAGNOSIS — G4733 Obstructive sleep apnea (adult) (pediatric): Secondary | ICD-10-CM

## 2024-08-21 DIAGNOSIS — G4730 Sleep apnea, unspecified: Secondary | ICD-10-CM

## 2024-08-21 NOTE — Assessment & Plan Note (Addendum)
 Mild OSA on CPAP. Excellent compliance and control. Minimal cardiovascular risks associated with mild OSA; however, she had significant daytime burden and hypoxia as a result. Receives benefit from use. Aware of proper care/use of device. Safe driving practices reviewed. Healthy weight management encouraged. Supplies order updated x 1 year.   Patient Instructions  Continue to use CPAP every night, minimum of 4-6 hours a night.  Change equipment as directed. Wash your tubing with warm soap and water  daily, hang to dry. Wash humidifier portion weekly. Use bottled, distilled water  and change daily Be aware of reduced alertness and do not drive or operate heavy machinery if experiencing this or drowsiness.  Exercise encouraged, as tolerated. Healthy weight management discussed.  Avoid or decrease alcohol consumption and medications that make you more sleepy, if possible. Notify if persistent daytime sleepiness occurs even with consistent use of PAP therapy.  Change CPAP supplies... Every month Mask cushions and/or nasal pillows CPAP machine filters Every 3 months Mask frame (not including the headgear) CPAP tubing Every 6 months Mask headgear Chin strap (if applicable) Humidifier water  tub  Follow up in one year, or sooner if needed

## 2024-08-21 NOTE — Patient Instructions (Signed)
 Continue to use CPAP every night, minimum of 4-6 hours a night.  Change equipment as directed. Wash your tubing with warm soap and water  daily, hang to dry. Wash humidifier portion weekly. Use bottled, distilled water  and change daily Be aware of reduced alertness and do not drive or operate heavy machinery if experiencing this or drowsiness.  Exercise encouraged, as tolerated. Healthy weight management discussed.  Avoid or decrease alcohol consumption and medications that make you more sleepy, if possible. Notify if persistent daytime sleepiness occurs even with consistent use of PAP therapy.  Change CPAP supplies... Every month Mask cushions and/or nasal pillows CPAP machine filters Every 3 months Mask frame (not including the headgear) CPAP tubing Every 6 months Mask headgear Chin strap (if applicable) Humidifier water  tub  Follow up in one year, or sooner if needed

## 2024-08-21 NOTE — Progress Notes (Signed)
 @Patient  ID: Allison Hart, female    DOB: January 05, 1961, 63 y.o.   MRN: [id number]  Chief Complaint  Patient presents with   Obstructive Sleep Apnea    Formally saw Dr Shellia. Had a Sleep study done in 2024.     Referring provider: Antonetta Rollene BRAVO, MD  HPI: 63 year old female, former smoker followed for OSA on CPAP. Former patient of Dr. Magdaleno and last seen in 2024. Past medical history significant for renal artery aneurysm, HTN, PH, tricuspid regurgitation, insomnia, HLD.   TEST/EVENTS:  09/20/2022 HST: AHI 9.7/h, spO2 low 64%  04/20/2023: Virtual visit with Dr. Shellia. OSA on CPAP. Using nightly. Sleeping better. Some dry nose and nose bleeds. Advised to increase humidity and try saline nasal gel   08/21/2024: Today - follow up Discussed the use of AI scribe software for clinical note transcription with the patient, who gave verbal consent to proceed.  History of Present Illness Allison Hart is a 63 year old female with obstructive sleep apnea who presents for a routine follow-up and prescription renewal for CPAP supplies.  She uses her CPAP machine regularly and reports feeling much better rested than before starting treatment. Her sleep is more restful and energy levels are decent during the day. No drowsy driving.   No issues with mask fit or pressure settings.    Allergies  Allergen Reactions   Versed  [Midazolam ] Shortness Of Breath and Other (See Comments)    Sweating, anxious and I felt like I was going to pass out   Misc. Sulfonamide Containing Compounds Itching   Sulfa  Antibiotics Other (See Comments)    Unknown    Immunization History  Administered Date(s) Administered   Hep A / Hep B 03/02/2023   Influenza Split 07/02/2012   Influenza, Seasonal, Injecte, Preservative Fre 08/15/2024   Influenza,inj,Quad PF,6+ Mos 11/04/2013, 10/26/2014, 11/09/2015, 05/29/2016, 10/13/2019, 09/28/2020, 08/02/2021, 06/08/2022, 07/24/2023   MODERNA COVID-19 SARS-COV-2  PEDS BIVALENT BOOSTER 20yr-65yr 07/07/2021   Moderna Sars-Covid-2 Vaccination 12/20/2019, 01/21/2020, 08/23/2020   PNEUMOCOCCAL CONJUGATE-20 10/30/2023   Td 02/04/2008   Tdap 06/01/2020   Zoster Recombinant(Shingrix) 04/20/2020, 09/21/2020    Past Medical History:  Diagnosis Date   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   Dx in 2017, and being followed by oncology, benign biopsy of right inguinal node in 2017     Anxiety    Arthritis    Essential hypertension, benign    GERD (gastroesophageal reflux disease)    Herpes    History of kidney stones    Murmur    Obesity    Primary biliary cholangitis (HCC)    Sinusitis    Sleep apnea    Urinary frequency     Tobacco History: Social History   Tobacco Use  Smoking Status Former   Current packs/day: 0.00   Average packs/day: 0.3 packs/day for 5.0 years (1.3 ttl pk-yrs)   Types: Cigarettes   Start date: 76   Quit date: 1997   Years since quitting: 28.8  Smokeless Tobacco Never   Counseling given: Not Answered   Outpatient Medications Prior to Visit  Medication Sig Dispense Refill   acyclovir  (ZOVIRAX ) 400 MG tablet Take 1 tablet (400 mg total) by mouth 2 (two) times daily. Take one twice daily as needed 60 tablet 0   albuterol  (VENTOLIN  HFA) 108 (90 Base) MCG/ACT inhaler Inhale 1-2 puffs into the lungs every 6 (six) hours as needed for wheezing or shortness of breath. 18 g 1   amlodipine -olmesartan  (AZOR ) 10-20 MG tablet Take 1  tablet by mouth once daily 90 tablet 0   aspirin  EC 81 MG tablet Take 1 tablet (81 mg total) by mouth daily. 100 tablet 2   Azelastine  HCl 137 MCG/SPRAY SOLN Place 2 sprays into the nose 2 (two) times daily as needed (allergies). 30 mL 5   cholecalciferol (VITAMIN D ) 1000 units tablet Take 1,000 Units by mouth daily.     Fluticasone  Propionate (XHANCE ) 93 MCG/ACT EXHU Place 1 Inhalation into the nose in the morning and at bedtime. 48 mL 1   Fluticasone -Umeclidin-Vilant (TRELEGY ELLIPTA ) 100-62.5-25 MCG/ACT AEPB  Inhale 1 puff into the lungs every morning. 60 each 5   furosemide  (LASIX ) 40 MG tablet Take 1 tablet by mouth once daily 90 tablet 0   Lidocaine  5 % CREA Knee pain/and right hip pain     montelukast  (SINGULAIR ) 10 MG tablet Take 1 tablet (10 mg total) by mouth at bedtime. 90 tablet 1   omeprazole  (PRILOSEC) 40 MG capsule Take 1 capsule by mouth once daily 90 capsule 0   potassium chloride  SA (KLOR-CON  M) 20 MEQ tablet Take 1 tablet by mouth twice daily 180 tablet 0   semaglutide -weight management (WEGOVY ) 0.25 MG/0.5ML SOAJ SQ injection Inject 0.25 mg into the skin once a week. 2 mL 0   spironolactone  (ALDACTONE ) 25 MG tablet Take 1 tablet by mouth once daily 90 tablet 0   traZODone (DESYREL) 50 MG tablet Take 0.5-1 tablets (25-50 mg total) by mouth at bedtime as needed for sleep. 30 tablet 3   ursodiol  (ACTIGALL ) 300 MG capsule TAKE 2 CAPSULES BY MOUTH TWICE DAILY WITH A MEAL 360 capsule 2   [START ON 09/12/2024] semaglutide -weight management (WEGOVY ) 0.5 MG/0.5ML SOAJ SQ injection Inject 0.5 mg into the skin once a week. (Patient not taking: Reported on 08/21/2024) 2 mL 2   No facility-administered medications prior to visit.     Review of Systems: as above    Physical Exam:  BP 126/74   Pulse 80   Temp (!) 97.3 F (36.3 C)   Ht 5' 9 (1.753 m)   Wt 214 lb 12.8 oz (97.4 kg)   LMP  (LMP Unknown)   SpO2 99% Comment: ra  BMI 31.72 kg/m   GEN: Pleasant, interactive, well-appearing; obese; in no acute distress HEENT:  Normocephalic and atraumatic. PERRLA. Sclera white. Nasal turbinates pink, moist and patent bilaterally. No rhinorrhea present. Oropharynx pink and moist, without exudate or edema. No lesions, ulcerations, or postnasal drip.  NECK:  Supple w/ fair ROM. No lymphadenopathy.   CV: RRR, no m/r/g PULMONARY:  Unlabored, regular breathing. Clear bilaterally A&P w/o wheezes/rales/rhonchi. No accessory muscle use.  GI: BS present and normoactive. Soft, non-tender to palpation.   Neuro: A/Ox3. No focal deficits noted.   Skin: Warm, no lesions or rashe Psych: Normal affect and behavior. Judgement and thought content appropriate.     Lab Results:  CBC    Component Value Date/Time   WBC 3.4 10/24/2023 0857   WBC 3.8 04/06/2020 0950   RBC 4.33 10/24/2023 0857   RBC 4.19 04/06/2020 0950   HGB 12.0 10/24/2023 0857   HCT 38.9 10/24/2023 0857   PLT 269 10/24/2023 0857   MCV 90 10/24/2023 0857   MCH 27.7 10/24/2023 0857   MCH 28.4 04/06/2020 0950   MCHC 30.8 (L) 10/24/2023 0857   MCHC 31.7 (L) 04/06/2020 0950   RDW 12.5 10/24/2023 0857   LYMPHSABS 1.0 07/04/2023 1421   MONOABS 0.6 02/12/2017 0644   EOSABS 0.1 07/04/2023 1421  BASOSABS 0.0 07/04/2023 1421    BMET    Component Value Date/Time   NA 144 08/06/2024 0843   K 5.1 08/06/2024 0843   CL 102 08/06/2024 0843   CO2 23 08/06/2024 0843   GLUCOSE 81 08/06/2024 0843   GLUCOSE 94 12/11/2022 1515   BUN 25 08/06/2024 0843   CREATININE 0.97 08/06/2024 0843   CREATININE 0.91 04/06/2020 0950   CALCIUM  9.8 08/06/2024 0843   GFRNONAA >60 12/11/2022 1515   GFRNONAA 70 04/06/2020 0950   GFRAA 81 04/06/2020 0950    BNP No results found for: BNP   Imaging:  No results found.  Administration History     None           No data to display          No results found for: NITRICOXIDE      Assessment & Plan:   Sleep apnea in adult Mild OSA on CPAP. Excellent compliance and control. Minimal cardiovascular risks associated with mild OSA; however, she had significant daytime burden and hypoxia as a result. Receives benefit from use. Aware of proper care/use of device. Safe driving practices reviewed. Healthy weight management encouraged. Supplies order updated x 1 year.   Patient Instructions  Continue to use CPAP every night, minimum of 4-6 hours a night.  Change equipment as directed. Wash your tubing with warm soap and water  daily, hang to dry. Wash humidifier portion weekly. Use  bottled, distilled water  and change daily Be aware of reduced alertness and do not drive or operate heavy machinery if experiencing this or drowsiness.  Exercise encouraged, as tolerated. Healthy weight management discussed.  Avoid or decrease alcohol consumption and medications that make you more sleepy, if possible. Notify if persistent daytime sleepiness occurs even with consistent use of PAP therapy.  Change CPAP supplies... Every month Mask cushions and/or nasal pillows CPAP machine filters Every 3 months Mask frame (not including the headgear) CPAP tubing Every 6 months Mask headgear Chin strap (if applicable) Humidifier water  tub  Follow up in one year, or sooner if needed    Advised if symptoms do not improve or worsen, to please contact office for sooner follow up or seek emergency care.   I spent 22 minutes of dedicated to the care of this patient on the date of this encounter to include pre-visit review of records, face-to-face time with the patient discussing conditions above, post visit ordering of testing, clinical documentation with the electronic health record, making appropriate referrals as documented, and communicating necessary findings to members of the patients care team.  Comer LULLA Rouleau, NP 08/21/2024  Pt aware and understands NP's role.

## 2024-08-27 ENCOUNTER — Other Ambulatory Visit (HOSPITAL_COMMUNITY): Payer: Self-pay

## 2024-08-27 NOTE — Telephone Encounter (Signed)
 Pharmacy Patient Advocate Encounter  Received notification from [facility] that Prior Authorization for Wegovy  0.25MG /0.5ML auto-injectors has been APPROVED from 07/21/2024 to 03/24/2025. Ran test claim, Copay is $24.99. This test claim was processed through [facility]- copay amounts may vary at other pharmacies due to pharmacy/plan contracts, or as the patient moves through the different stages of their insurance plan.   PA #/Case ID/Reference #: [id number]

## 2024-08-28 ENCOUNTER — Encounter: Payer: Self-pay | Admitting: Family Medicine

## 2024-09-18 ENCOUNTER — Telehealth: Payer: Self-pay

## 2024-09-18 ENCOUNTER — Other Ambulatory Visit: Payer: Self-pay | Admitting: Family Medicine

## 2024-09-18 MED ORDER — SEMAGLUTIDE-WEIGHT MANAGEMENT 0.5 MG/0.5ML ~~LOC~~ SOAJ: 0.5000 mg | SUBCUTANEOUS | 2 refills | Status: DC

## 2024-09-18 NOTE — Addendum Note (Signed)
 Addended by: ANTONETTA ROLLENE BRAVO on: 09/18/2024 10:46 AM   Modules accepted: Orders

## 2024-09-18 NOTE — Telephone Encounter (Signed)
 Pt needing refill of wegovy 

## 2024-09-26 ENCOUNTER — Other Ambulatory Visit: Payer: Self-pay | Admitting: Internal Medicine

## 2024-09-26 ENCOUNTER — Other Ambulatory Visit: Payer: Self-pay | Admitting: Family Medicine

## 2024-10-06 ENCOUNTER — Encounter: Payer: Self-pay | Admitting: Family Medicine

## 2024-10-06 ENCOUNTER — Other Ambulatory Visit: Payer: Self-pay

## 2024-10-06 ENCOUNTER — Encounter: Payer: Self-pay | Admitting: Internal Medicine

## 2024-10-06 DIAGNOSIS — R7989 Other specified abnormal findings of blood chemistry: Secondary | ICD-10-CM

## 2024-10-06 DIAGNOSIS — K743 Primary biliary cirrhosis: Secondary | ICD-10-CM

## 2024-10-18 ENCOUNTER — Other Ambulatory Visit: Payer: Self-pay | Admitting: Family Medicine

## 2024-10-22 LAB — HEPATIC FUNCTION PANEL
ALT: 16 IU/L (ref 0–32)
AST: 19 IU/L (ref 0–40)
Albumin: 4.4 g/dL (ref 3.9–4.9)
Alkaline Phosphatase: 104 IU/L (ref 49–135)
Bilirubin Total: 0.6 mg/dL (ref 0.0–1.2)
Bilirubin, Direct: 0.25 mg/dL (ref 0.00–0.40)
Total Protein: 7 g/dL (ref 6.0–8.5)

## 2024-10-27 ENCOUNTER — Other Ambulatory Visit: Payer: Self-pay | Admitting: Family Medicine

## 2024-10-27 ENCOUNTER — Ambulatory Visit: Payer: Self-pay | Admitting: Internal Medicine

## 2024-10-27 DIAGNOSIS — R7989 Other specified abnormal findings of blood chemistry: Secondary | ICD-10-CM

## 2024-10-29 ENCOUNTER — Other Ambulatory Visit: Payer: Self-pay | Admitting: Family Medicine

## 2024-11-04 ENCOUNTER — Other Ambulatory Visit: Payer: Self-pay | Admitting: Internal Medicine

## 2024-11-05 ENCOUNTER — Ambulatory Visit: Admitting: Family Medicine

## 2024-11-05 ENCOUNTER — Encounter: Payer: Self-pay | Admitting: Family Medicine

## 2024-11-05 VITALS — BP 130/80 | HR 90 | Resp 16 | Ht 69.0 in | Wt 203.1 lb

## 2024-11-05 DIAGNOSIS — E559 Vitamin D deficiency, unspecified: Secondary | ICD-10-CM | POA: Diagnosis not present

## 2024-11-05 DIAGNOSIS — K743 Primary biliary cirrhosis: Secondary | ICD-10-CM | POA: Diagnosis not present

## 2024-11-05 DIAGNOSIS — F5102 Adjustment insomnia: Secondary | ICD-10-CM

## 2024-11-05 DIAGNOSIS — E663 Overweight: Secondary | ICD-10-CM | POA: Diagnosis not present

## 2024-11-05 DIAGNOSIS — E785 Hyperlipidemia, unspecified: Secondary | ICD-10-CM

## 2024-11-05 DIAGNOSIS — I10 Essential (primary) hypertension: Secondary | ICD-10-CM

## 2024-11-05 MED ORDER — SEMAGLUTIDE-WEIGHT MANAGEMENT 0.5 MG/0.5ML ~~LOC~~ SOAJ: 0.5000 mg | SUBCUTANEOUS | 1 refills | Status: AC

## 2024-11-05 NOTE — Patient Instructions (Signed)
 F/U in 4 months  Please schedule mammogram at checkout  Saving coupon is for tablet , you are on the injection , sorry for mis information  CONGRATS on excellent weight loss, please add 1 addiotonal meal as we discussed  Fasting CBC, lipid, bmp and EGFR, tSH, vit D to be drawn 3 to 5 days before 4 month follow up visit  It is important that you exercise regularly at least 30 minutes 5 times a week. If you develop chest pain, have severe difficulty breathing, or feel very tired, stop exercising immediately and seek medical attention   Think about what you will eat, plan ahead. Choose  clean, green, fresh or frozen over canned, processed or packaged foods which are more sugary, salty and fatty. 70 to 75% of food eaten should be vegetables and fruit. Three meals at set times with snacks allowed between meals, but they must be fruit or vegetables. Aim to eat over a 12 hour period , example 7 am to 7 pm, and STOP after  your last meal of the day. Drink water ,generally about 64 ounces per day, no other drink is as healthy. Fruit juice is best enjoyed in a healthy way, by EATING the fruit.  Thanks for choosing [facility], we consider it a privelige to serve you.

## 2024-11-06 ENCOUNTER — Encounter: Payer: Self-pay | Admitting: Family Medicine

## 2024-11-06 DIAGNOSIS — E559 Vitamin D deficiency, unspecified: Secondary | ICD-10-CM | POA: Insufficient documentation

## 2024-11-06 NOTE — Assessment & Plan Note (Signed)
 Excellent response to wegovy , pt to maintain same dose  Re assess in 4 monhts, target qweight loss 10 to 12 pounds  Patient re-educated about  the importance of commitment to a  minimum of 150 minutes of exercise per week as able.  The importance of healthy food choices with portion control discussed, as well as eating regularly and within a 12 hour window most days. The need to choose clean , green food 50 to 75% of the time is discussed, as well as to make water  the primary drink and set a goal of 64 ounces water  daily.       11/05/2024    1:51 PM 08/21/2024    4:02 PM 08/15/2024   12:58 PM  Weight /BMI  Weight 203 lb 1.9 oz 214 lb 12.8 oz 219 lb  Height 5' 9 (1.753 m) 5' 9 (1.753 m) 5' 9 (1.753 m)  BMI 30 kg/m2 31.72 kg/m2 32.34 kg/m2

## 2024-11-06 NOTE — Assessment & Plan Note (Signed)
 No response to medication, always listening for her husband with dementia Sleep hygiene reviewed.

## 2024-11-06 NOTE — Assessment & Plan Note (Signed)
 On ursodiol  and treated by GI

## 2024-11-06 NOTE — Assessment & Plan Note (Signed)
 Controlled, no change in medication DASH diet and commitment to daily physical activity for a minimum of 30 minutes discussed and encouraged, as a part of hypertension management. The importance of attaining a healthy weight is also discussed.     11/05/2024    2:23 PM 11/05/2024    1:51 PM 08/21/2024    4:02 PM 08/15/2024   12:58 PM 08/11/2024    3:08 PM 07/01/2024    3:41 PM 05/02/2024    9:30 AM  BP/Weight  Systolic BP 130 133 126 133 118 123 133  Diastolic BP 80 80 74 85 80 83 90  Wt. (Lbs)  203.12 214.8 219 217.2 217.2   BMI  30 kg/m2 31.72 kg/m2 32.34 kg/m2 32.17 kg/m2 32.07 kg/m2

## 2024-11-06 NOTE — Assessment & Plan Note (Signed)
 Hyperlipidemia:Low fat diet discussed and encouraged.   Lipid Panel  Lab Results  Component Value Date   CHOL 225 (H) 08/06/2024   HDL 52 08/06/2024   LDLCALC 159 (H) 08/06/2024   TRIG 77 08/06/2024   CHOLHDL 4.3 08/06/2024     Updated lab needed at/ before next visit. Low fat diet discussed

## 2024-11-06 NOTE — Progress Notes (Signed)
 "  Allison Hart     MRN: [id number]      DOB: 1961-01-12  Chief Complaint  Patient presents with   Hypertension    12 week follow up     HPI Allison Hart is here for follow up and re-evaluation of chronic medical conditions, medication management and review of any available recent lab and radiology data.  Preventive health is updated, specifically  Cancer screening and Immunization.   Questions or concerns regarding consultations or procedures which the PT has had in the interim are  addressed. The PT denies any adverse reactions to current medications since the last visit. Thinks she overdid not eating initially, denies any adverse s/e from medication, wegovy , notes mild constipation but uses fiber and stool softener  Exercise will improve, and she will do this at home using videos  ROS Denies recent fever or chills. Denies sinus pressure, nasal congestion, ear pain or sore throat. Denies chest congestion, productive cough or wheezing. Denies chest pains, palpitations and leg swelling Denies abdominal pain, nausea, vomiting,diarrhea or constipation.   Denies dysuria, frequency, hesitancy or incontinence. Denies joint pain, swelling and limitation in mobility. Denies headaches, seizures, numbness, or tingling. Denies depression, anxiety or insomnia. Denies skin break down or rash.   PE  BP 130/80   Pulse 90   Resp 16   Ht 5' 9 (1.753 m)   Wt 203 lb 1.9 oz (92.1 kg)   LMP  (LMP Unknown)   SpO2 92%   BMI 30.00 kg/m   Patient alert and oriented and in no cardiopulmonary distress.  HEENT: No facial asymmetry, EOMI,     Neck supple .  Chest: Clear to auscultation bilaterally.  CVS: S1, S2 systolic  murmurs, no S3.Regular rate.  ABD: Soft non tender.   Ext: No edema  MS: Adequate ROM spine, shoulders, hips and knees.  Skin: Intact, no ulcerations or rash noted.  Psych: Good eye contact, normal affect. Memory intact not anxious or depressed appearing.  CNS: CN  2-12 intact, power,  normal throughout.no focal deficits noted.   Assessment & Plan  Primary hypertension Controlled, no change in medication DASH diet and commitment to daily physical activity for a minimum of 30 minutes discussed and encouraged, as a part of hypertension management. The importance of attaining a healthy weight is also discussed.     11/05/2024    2:23 PM 11/05/2024    1:51 PM 08/21/2024    4:02 PM 08/15/2024   12:58 PM 08/11/2024    3:08 PM 07/01/2024    3:41 PM 05/02/2024    9:30 AM  BP/Weight  Systolic BP 130 133 126 133 118 123 133  Diastolic BP 80 80 74 85 80 83 90  Wt. (Lbs)  203.12 214.8 219 217.2 217.2   BMI  30 kg/m2 31.72 kg/m2 32.34 kg/m2 32.17 kg/m2 32.07 kg/m2        Overweight (BMI 25.0-29.9) Excellent response to wegovy , pt to maintain same dose  Re assess in 4 monhts, target qweight loss 10 to 12 pounds  Patient re-educated about  the importance of commitment to a  minimum of 150 minutes of exercise per week as able.  The importance of healthy food choices with portion control discussed, as well as eating regularly and within a 12 hour window most days. The need to choose clean , green food 50 to 75% of the time is discussed, as well as to make water  the primary drink and set a goal of 64 ounces  water  daily.       11/05/2024    1:51 PM 08/21/2024    4:02 PM 08/15/2024   12:58 PM  Weight /BMI  Weight 203 lb 1.9 oz 214 lb 12.8 oz 219 lb  Height 5' 9 (1.753 m) 5' 9 (1.753 m) 5' 9 (1.753 m)  BMI 30 kg/m2 31.72 kg/m2 32.34 kg/m2      Hyperlipidemia LDL goal <100 Hyperlipidemia:Low fat diet discussed and encouraged.   Lipid Panel  Lab Results  Component Value Date   CHOL 225 (H) 08/06/2024   HDL 52 08/06/2024   LDLCALC 159 (H) 08/06/2024   TRIG 77 08/06/2024   CHOLHDL 4.3 08/06/2024     Updated lab needed at/ before next visit. Low fat diet discussed  Insomnia due to psychological stress No response to medication, always  listening for her husband with dementia Sleep hygiene reviewed.   Primary biliary cholangitis (HCC) On ursodiol  and treated by GI  "

## 2025-02-09 ENCOUNTER — Ambulatory Visit: Admitting: Internal Medicine

## 2025-02-09 ENCOUNTER — Ambulatory Visit: Admitting: Family Medicine

## 2025-03-05 ENCOUNTER — Ambulatory Visit: Payer: Self-pay | Admitting: Family Medicine
